# Patient Record
Sex: Female | Born: 1960 | Race: Black or African American | Hispanic: No | Marital: Married | Smoking: Never smoker
Health system: Southern US, Community
[De-identification: ages and names within clinical notes are randomized; demographics above are authoritative.]

## PROBLEM LIST (undated history)

## (undated) DIAGNOSIS — E785 Hyperlipidemia, unspecified: Secondary | ICD-10-CM

## (undated) DIAGNOSIS — J069 Acute upper respiratory infection, unspecified: Secondary | ICD-10-CM

## (undated) DIAGNOSIS — K219 Gastro-esophageal reflux disease without esophagitis: Secondary | ICD-10-CM

## (undated) DIAGNOSIS — I10 Essential (primary) hypertension: Secondary | ICD-10-CM

## (undated) DIAGNOSIS — I499 Cardiac arrhythmia, unspecified: Secondary | ICD-10-CM

## (undated) DIAGNOSIS — T7840XA Allergy, unspecified, initial encounter: Secondary | ICD-10-CM

## (undated) DIAGNOSIS — E0590 Thyrotoxicosis, unspecified without thyrotoxic crisis or storm: Secondary | ICD-10-CM

## (undated) DIAGNOSIS — M1990 Unspecified osteoarthritis, unspecified site: Secondary | ICD-10-CM

## (undated) HISTORY — DX: Acute upper respiratory infection, unspecified: J06.9

## (undated) HISTORY — PX: TUBAL LIGATION: SHX77

## (undated) HISTORY — DX: Gastro-esophageal reflux disease without esophagitis: K21.9

## (undated) HISTORY — DX: Hyperlipidemia, unspecified: E78.5

## (undated) HISTORY — DX: Allergy, unspecified, initial encounter: T78.40XA

---

## 1990-10-07 HISTORY — PX: THYROIDECTOMY, PARTIAL: SHX18

## 2001-12-25 ENCOUNTER — Emergency Department (HOSPITAL_COMMUNITY): Admission: EM | Admit: 2001-12-25 | Discharge: 2001-12-25 | Payer: Self-pay

## 2001-12-27 ENCOUNTER — Inpatient Hospital Stay (HOSPITAL_COMMUNITY): Admission: AD | Admit: 2001-12-27 | Discharge: 2001-12-27 | Payer: Self-pay | Admitting: *Deleted

## 2002-07-27 ENCOUNTER — Emergency Department (HOSPITAL_COMMUNITY): Admission: EM | Admit: 2002-07-27 | Discharge: 2002-07-27 | Payer: Self-pay | Admitting: Emergency Medicine

## 2002-10-30 ENCOUNTER — Emergency Department (HOSPITAL_COMMUNITY): Admission: EM | Admit: 2002-10-30 | Discharge: 2002-10-30 | Payer: Self-pay | Admitting: *Deleted

## 2003-06-21 ENCOUNTER — Encounter: Admission: RE | Admit: 2003-06-21 | Discharge: 2003-06-21 | Payer: Self-pay | Admitting: Internal Medicine

## 2003-06-21 ENCOUNTER — Encounter: Payer: Self-pay | Admitting: Internal Medicine

## 2004-01-31 ENCOUNTER — Encounter: Admission: RE | Admit: 2004-01-31 | Discharge: 2004-01-31 | Payer: Self-pay | Admitting: Internal Medicine

## 2004-05-09 ENCOUNTER — Encounter: Admission: RE | Admit: 2004-05-09 | Discharge: 2004-05-09 | Payer: Self-pay | Admitting: Internal Medicine

## 2004-05-16 ENCOUNTER — Encounter: Admission: RE | Admit: 2004-05-16 | Discharge: 2004-05-16 | Payer: Self-pay | Admitting: Internal Medicine

## 2004-06-18 ENCOUNTER — Ambulatory Visit: Payer: Self-pay | Admitting: Internal Medicine

## 2005-09-27 ENCOUNTER — Ambulatory Visit: Payer: Self-pay | Admitting: Family Medicine

## 2005-10-01 ENCOUNTER — Ambulatory Visit (HOSPITAL_COMMUNITY): Admission: RE | Admit: 2005-10-01 | Discharge: 2005-10-01 | Payer: Self-pay | Admitting: Internal Medicine

## 2005-10-10 ENCOUNTER — Ambulatory Visit: Payer: Self-pay | Admitting: Family Medicine

## 2005-10-10 LAB — CONVERTED CEMR LAB: Pap Smear: NORMAL

## 2005-10-15 ENCOUNTER — Ambulatory Visit: Payer: Self-pay | Admitting: *Deleted

## 2006-01-08 ENCOUNTER — Ambulatory Visit: Payer: Self-pay | Admitting: Family Medicine

## 2006-01-10 ENCOUNTER — Ambulatory Visit (HOSPITAL_COMMUNITY): Admission: RE | Admit: 2006-01-10 | Discharge: 2006-01-10 | Payer: Self-pay | Admitting: Internal Medicine

## 2006-01-13 ENCOUNTER — Ambulatory Visit: Payer: Self-pay | Admitting: Family Medicine

## 2006-02-06 ENCOUNTER — Ambulatory Visit: Payer: Self-pay | Admitting: Family Medicine

## 2006-03-19 ENCOUNTER — Emergency Department (HOSPITAL_COMMUNITY): Admission: EM | Admit: 2006-03-19 | Discharge: 2006-03-19 | Payer: Self-pay | Admitting: *Deleted

## 2006-10-01 ENCOUNTER — Encounter: Admission: RE | Admit: 2006-10-01 | Discharge: 2006-10-01 | Payer: Self-pay | Admitting: Internal Medicine

## 2007-06-19 ENCOUNTER — Inpatient Hospital Stay (HOSPITAL_COMMUNITY): Admission: AD | Admit: 2007-06-19 | Discharge: 2007-06-19 | Payer: Self-pay | Admitting: Obstetrics & Gynecology

## 2007-06-24 ENCOUNTER — Encounter (INDEPENDENT_AMBULATORY_CARE_PROVIDER_SITE_OTHER): Payer: Self-pay | Admitting: *Deleted

## 2007-07-07 ENCOUNTER — Encounter (INDEPENDENT_AMBULATORY_CARE_PROVIDER_SITE_OTHER): Payer: Self-pay | Admitting: Family Medicine

## 2007-07-07 DIAGNOSIS — I868 Varicose veins of other specified sites: Secondary | ICD-10-CM | POA: Insufficient documentation

## 2007-07-07 DIAGNOSIS — E079 Disorder of thyroid, unspecified: Secondary | ICD-10-CM | POA: Insufficient documentation

## 2007-07-07 DIAGNOSIS — K219 Gastro-esophageal reflux disease without esophagitis: Secondary | ICD-10-CM | POA: Insufficient documentation

## 2007-07-28 ENCOUNTER — Emergency Department (HOSPITAL_COMMUNITY): Admission: EM | Admit: 2007-07-28 | Discharge: 2007-07-28 | Payer: Self-pay | Admitting: Emergency Medicine

## 2007-09-09 ENCOUNTER — Emergency Department (HOSPITAL_COMMUNITY): Admission: EM | Admit: 2007-09-09 | Discharge: 2007-09-09 | Payer: Self-pay | Admitting: Emergency Medicine

## 2007-09-25 ENCOUNTER — Ambulatory Visit: Payer: Self-pay | Admitting: Internal Medicine

## 2007-09-25 LAB — CONVERTED CEMR LAB
ALT: 13 units/L (ref 0–35)
AST: 15 units/L (ref 0–37)
Albumin: 3.9 g/dL (ref 3.5–5.2)
Alkaline Phosphatase: 68 units/L (ref 39–117)
BUN: 12 mg/dL (ref 6–23)
Basophils Absolute: 0 10*3/uL (ref 0.0–0.1)
Basophils Relative: 0 % (ref 0–1)
CO2: 25 meq/L (ref 19–32)
Calcium: 9.1 mg/dL (ref 8.4–10.5)
Chloride: 103 meq/L (ref 96–112)
Cholesterol: 209 mg/dL — ABNORMAL HIGH (ref 0–200)
Creatinine, Ser: 0.38 mg/dL — ABNORMAL LOW (ref 0.40–1.20)
Eosinophils Absolute: 0.6 10*3/uL (ref 0.2–0.7)
Eosinophils Relative: 7 % — ABNORMAL HIGH (ref 0–5)
Glucose, Bld: 115 mg/dL — ABNORMAL HIGH (ref 70–99)
HCT: 40.2 % (ref 36.0–46.0)
HDL: 44 mg/dL (ref >39)
Helicobacter Pylori Antibody-IgG: 3.8 — ABNORMAL HIGH
Hemoglobin: 12.7 g/dL (ref 12.0–15.0)
LDL Cholesterol: 134 mg/dL — ABNORMAL HIGH (ref 0–99)
Lymphocytes Relative: 30 % (ref 12–46)
Lymphs Abs: 2.6 10*3/uL (ref 0.7–4.0)
MCHC: 31.6 g/dL (ref 30.0–36.0)
MCV: 81.7 fL (ref 78.0–100.0)
Monocytes Absolute: 0.6 10*3/uL (ref 0.1–1.0)
Monocytes Relative: 7 % (ref 3–12)
Neutro Abs: 4.9 10*3/uL (ref 1.7–7.7)
Neutrophils Relative %: 56 % (ref 43–77)
Platelets: 355 10*3/uL (ref 150–400)
Potassium: 4.2 meq/L (ref 3.5–5.3)
RBC: 4.92 M/uL (ref 3.87–5.11)
RDW: 14.2 % (ref 11.5–15.5)
Sodium: 141 meq/L (ref 135–145)
TSH: 0.657 microintl units/mL (ref 0.350–5.50)
Total Bilirubin: 0.5 mg/dL (ref 0.3–1.2)
Total CHOL/HDL Ratio: 4.8
Total Protein: 7 g/dL (ref 6.0–8.3)
Triglycerides: 155 mg/dL — ABNORMAL HIGH (ref <150)
VLDL: 31 mg/dL (ref 0–40)
WBC: 8.9 10*3/uL (ref 4.0–10.5)

## 2007-11-18 ENCOUNTER — Encounter: Admission: RE | Admit: 2007-11-18 | Discharge: 2007-11-18 | Payer: Self-pay | Admitting: General Practice

## 2008-01-26 ENCOUNTER — Encounter: Admission: RE | Admit: 2008-01-26 | Discharge: 2008-01-26 | Payer: Self-pay | Admitting: General Practice

## 2009-05-12 ENCOUNTER — Encounter (INDEPENDENT_AMBULATORY_CARE_PROVIDER_SITE_OTHER): Payer: Self-pay | Admitting: Adult Health

## 2009-05-12 ENCOUNTER — Ambulatory Visit: Payer: Self-pay | Admitting: Internal Medicine

## 2009-05-12 LAB — CONVERTED CEMR LAB
ALT: 17 units/L (ref 0–35)
AST: 14 units/L (ref 0–37)
Albumin: 3.9 g/dL (ref 3.5–5.2)
Alkaline Phosphatase: 66 units/L (ref 39–117)
BUN: 14 mg/dL (ref 6–23)
Basophils Absolute: 0 10*3/uL (ref 0.0–0.1)
Basophils Relative: 0 % (ref 0–1)
CO2: 23 meq/L (ref 19–32)
Calcium: 8.9 mg/dL (ref 8.4–10.5)
Chloride: 103 meq/L (ref 96–112)
Creatinine, Ser: 0.41 mg/dL (ref 0.40–1.20)
Eosinophils Absolute: 0.3 10*3/uL (ref 0.0–0.7)
Eosinophils Relative: 4 % (ref 0–5)
Glucose, Bld: 102 mg/dL — ABNORMAL HIGH (ref 70–99)
HCT: 39 % (ref 36.0–46.0)
Helicobacter Pylori Antibody-IgG: 4.4 — ABNORMAL HIGH
Hemoglobin: 12.2 g/dL (ref 12.0–15.0)
Lymphocytes Relative: 32 % (ref 12–46)
Lymphs Abs: 2.4 10*3/uL (ref 0.7–4.0)
MCHC: 31.3 g/dL (ref 30.0–36.0)
MCV: 79.8 fL (ref 78.0–100.0)
Monocytes Absolute: 0.4 10*3/uL (ref 0.1–1.0)
Monocytes Relative: 6 % (ref 3–12)
Neutro Abs: 4.3 10*3/uL (ref 1.7–7.7)
Neutrophils Relative %: 58 % (ref 43–77)
Platelets: 344 10*3/uL (ref 150–400)
Potassium: 4 meq/L (ref 3.5–5.3)
RBC: 4.89 M/uL (ref 3.87–5.11)
RDW: 14.9 % (ref 11.5–15.5)
Sodium: 139 meq/L (ref 135–145)
T3 Uptake Ratio: 40 % — ABNORMAL HIGH (ref 22.5–37.0)
TSH: 0.649 microintl units/mL (ref 0.350–4.500)
Total Bilirubin: 0.4 mg/dL (ref 0.3–1.2)
Total Protein: 6.9 g/dL (ref 6.0–8.3)
WBC: 7.4 10*3/uL (ref 4.0–10.5)

## 2009-06-19 ENCOUNTER — Ambulatory Visit: Payer: Self-pay | Admitting: Internal Medicine

## 2009-06-19 ENCOUNTER — Encounter (INDEPENDENT_AMBULATORY_CARE_PROVIDER_SITE_OTHER): Payer: Self-pay | Admitting: Adult Health

## 2009-06-19 LAB — CONVERTED CEMR LAB: Microalb, Ur: 1.4 mg/dL (ref 0.00–1.89)

## 2009-07-20 ENCOUNTER — Ambulatory Visit: Payer: Self-pay | Admitting: Internal Medicine

## 2009-10-06 ENCOUNTER — Inpatient Hospital Stay (HOSPITAL_COMMUNITY): Admission: EM | Admit: 2009-10-06 | Discharge: 2009-10-10 | Payer: Self-pay | Admitting: Emergency Medicine

## 2009-11-03 ENCOUNTER — Encounter: Admission: RE | Admit: 2009-11-03 | Discharge: 2009-11-03 | Payer: Self-pay | Admitting: General Surgery

## 2010-02-16 ENCOUNTER — Emergency Department (HOSPITAL_COMMUNITY): Admission: EM | Admit: 2010-02-16 | Discharge: 2010-02-16 | Payer: Self-pay | Admitting: Emergency Medicine

## 2010-03-14 ENCOUNTER — Encounter: Admission: RE | Admit: 2010-03-14 | Discharge: 2010-03-14 | Payer: Self-pay | Admitting: Internal Medicine

## 2010-08-03 ENCOUNTER — Emergency Department (HOSPITAL_COMMUNITY): Admission: EM | Admit: 2010-08-03 | Discharge: 2010-08-03 | Payer: Self-pay | Admitting: Emergency Medicine

## 2010-10-28 ENCOUNTER — Encounter: Payer: Self-pay | Admitting: Internal Medicine

## 2010-10-28 ENCOUNTER — Encounter: Payer: Self-pay | Admitting: General Surgery

## 2010-10-29 ENCOUNTER — Encounter: Payer: Self-pay | Admitting: Internal Medicine

## 2010-11-06 NOTE — Miscellaneous (Signed)
Summary: VIP  Patient: Crystal [name] Note: All result statuses are Final unless otherwise noted.  Tests: (1) VIP (Medications)   LLIMPORTMEDS              "Result Below..."       RESULT: RHINOCORT AQUA SUSP 32 MCG/ACT*USE ONE TO TWO SPRAYS IN EACH NOSTRIL DAILY FOR ALLERGIES.  GENE HAS ICP RHINOCORT 04/07*08/04/2007*Last Refill: [id number]***   LLIMPORTALLS              "Result Below..."       RESULT: ASPIRIN UXLK*[id number]**  Note: An exclamation mark (!) indicates a result that was not dispersed into the flowsheet. Document Creation Date: 08/06/2007 3:05 PM _______________________________________________________________________  (1) Order result status: Final Collection or observation date-time: 06/24/2007 Requested date-time: 06/24/2007 Receipt date-time:  Reported date-time: 06/24/2007 Referring Physician:   Ordering Physician:   Specimen Source:  Source: [facility] [place] Order Number:  Lab site:

## 2010-12-19 LAB — CBC
HCT: 40.4 % (ref 36.0–46.0)
Hemoglobin: 13 g/dL (ref 12.0–15.0)
MCH: 25.3 pg — ABNORMAL LOW (ref 26.0–34.0)
MCHC: 32.2 g/dL (ref 30.0–36.0)
MCV: 78.8 fL (ref 78.0–100.0)
Platelets: 301 10*3/uL (ref 150–400)
RBC: 5.13 MIL/uL — ABNORMAL HIGH (ref 3.87–5.11)
RDW: 14 % (ref 11.5–15.5)
WBC: 9 10*3/uL (ref 4.0–10.5)

## 2010-12-19 LAB — COMPREHENSIVE METABOLIC PANEL
ALT: 32 U/L (ref 0–35)
AST: 25 U/L (ref 0–37)
Albumin: 3.4 g/dL — ABNORMAL LOW (ref 3.5–5.2)
Alkaline Phosphatase: 79 U/L (ref 39–117)
BUN: 9 mg/dL (ref 6–23)
CO2: 24 mEq/L (ref 19–32)
Calcium: 9.1 mg/dL (ref 8.4–10.5)
Chloride: 103 mEq/L (ref 96–112)
Creatinine, Ser: 0.41 mg/dL (ref 0.4–1.2)
GFR calc Af Amer: >60 mL/min (ref >60)
GFR calc non Af Amer: >60 mL/min (ref >60)
Glucose, Bld: 271 mg/dL — ABNORMAL HIGH (ref 70–99)
Potassium: 3.6 mEq/L (ref 3.5–5.1)
Sodium: 134 mEq/L — ABNORMAL LOW (ref 135–145)
Total Bilirubin: 0.4 mg/dL (ref 0.3–1.2)
Total Protein: 7.4 g/dL (ref 6.0–8.3)

## 2010-12-19 LAB — URINE MICROSCOPIC-ADD ON

## 2010-12-19 LAB — POCT I-STAT, CHEM 8
BUN: 9 mg/dL (ref 6–23)
Calcium, Ion: 1.14 mmol/L (ref 1.12–1.32)
Chloride: 103 mEq/L (ref 96–112)
Creatinine, Ser: 0.3 mg/dL — ABNORMAL LOW (ref 0.4–1.2)
Glucose, Bld: 284 mg/dL — ABNORMAL HIGH (ref 70–99)
HCT: 43 % (ref 36.0–46.0)
Hemoglobin: 14.6 g/dL (ref 12.0–15.0)
Potassium: 3.7 mEq/L (ref 3.5–5.1)
Sodium: 137 mEq/L (ref 135–145)
TCO2: 25 mmol/L (ref 0–100)

## 2010-12-19 LAB — URINALYSIS, ROUTINE W REFLEX MICROSCOPIC
Bilirubin Urine: NEGATIVE
Glucose, UA: >1000 mg/dL — AB
Ketones, ur: NEGATIVE mg/dL
Leukocytes, UA: NEGATIVE
Nitrite: NEGATIVE
Protein, ur: NEGATIVE mg/dL
Specific Gravity, Urine: 1.04 — ABNORMAL HIGH (ref 1.005–1.030)
Urobilinogen, UA: 0.2 mg/dL (ref 0.0–1.0)
pH: 5 (ref 5.0–8.0)

## 2010-12-19 LAB — DIFFERENTIAL
Basophils Absolute: 0 10*3/uL (ref 0.0–0.1)
Basophils Relative: 0 % (ref 0–1)
Eosinophils Absolute: 0.1 10*3/uL (ref 0.0–0.7)
Eosinophils Relative: 2 % (ref 0–5)
Lymphocytes Relative: 29 % (ref 12–46)
Lymphs Abs: 2.6 10*3/uL (ref 0.7–4.0)
Monocytes Absolute: 0.5 10*3/uL (ref 0.1–1.0)
Monocytes Relative: 6 % (ref 3–12)
Neutro Abs: 5.7 10*3/uL (ref 1.7–7.7)
Neutrophils Relative %: 64 % (ref 43–77)

## 2010-12-19 LAB — POCT PREGNANCY, URINE: Preg Test, Ur: NEGATIVE

## 2010-12-19 LAB — LIPASE, BLOOD: Lipase: 27 U/L (ref 11–59)

## 2010-12-23 LAB — CBC
HCT: 34.7 % — ABNORMAL LOW (ref 36.0–46.0)
Hemoglobin: 11.6 g/dL — ABNORMAL LOW (ref 12.0–15.0)
MCHC: 33.6 g/dL (ref 30.0–36.0)
MCV: 79 fL (ref 78.0–100.0)
Platelets: 371 10*3/uL (ref 150–400)
RBC: 4.39 MIL/uL (ref 3.87–5.11)
RDW: 14 % (ref 11.5–15.5)
WBC: 9.1 10*3/uL (ref 4.0–10.5)

## 2011-01-07 LAB — WET PREP, GENITAL
Clue Cells Wet Prep HPF POC: NONE SEEN
Trich, Wet Prep: NONE SEEN
WBC, Wet Prep HPF POC: NONE SEEN

## 2011-01-07 LAB — BASIC METABOLIC PANEL
BUN: 7 mg/dL (ref 6–23)
CO2: 28 mEq/L (ref 19–32)
Calcium: 8.8 mg/dL (ref 8.4–10.5)
Chloride: 104 mEq/L (ref 96–112)
Creatinine, Ser: 0.36 mg/dL — ABNORMAL LOW (ref 0.4–1.2)
GFR calc Af Amer: >60 mL/min (ref >60)
GFR calc non Af Amer: >60 mL/min (ref >60)
Glucose, Bld: 157 mg/dL — ABNORMAL HIGH (ref 70–99)
Potassium: 4.2 mEq/L (ref 3.5–5.1)
Sodium: 139 mEq/L (ref 135–145)

## 2011-01-07 LAB — URINALYSIS, ROUTINE W REFLEX MICROSCOPIC
Bilirubin Urine: NEGATIVE
Glucose, UA: NEGATIVE mg/dL
Ketones, ur: NEGATIVE mg/dL
Leukocytes, UA: NEGATIVE
Nitrite: NEGATIVE
Protein, ur: NEGATIVE mg/dL
Specific Gravity, Urine: 1.026 (ref 1.005–1.030)
Urobilinogen, UA: 1 mg/dL (ref 0.0–1.0)
pH: 5.5 (ref 5.0–8.0)

## 2011-01-07 LAB — DIFFERENTIAL
Basophils Absolute: 0 10*3/uL (ref 0.0–0.1)
Basophils Relative: 0 % (ref 0–1)
Eosinophils Absolute: 0.1 10*3/uL (ref 0.0–0.7)
Eosinophils Relative: 1 % (ref 0–5)
Lymphocytes Relative: 12 % (ref 12–46)
Lymphs Abs: 1.6 10*3/uL (ref 0.7–4.0)
Monocytes Absolute: 0.9 10*3/uL (ref 0.1–1.0)
Monocytes Relative: 7 % (ref 3–12)
Neutro Abs: 10.3 10*3/uL — ABNORMAL HIGH (ref 1.7–7.7)
Neutrophils Relative %: 80 % — ABNORMAL HIGH (ref 43–77)

## 2011-01-07 LAB — URINE MICROSCOPIC-ADD ON

## 2011-01-07 LAB — PREGNANCY, URINE: Preg Test, Ur: NEGATIVE

## 2011-01-07 LAB — CBC
HCT: 37.5 % (ref 36.0–46.0)
Hemoglobin: 12.6 g/dL (ref 12.0–15.0)
MCHC: 33.6 g/dL (ref 30.0–36.0)
MCV: 79.1 fL (ref 78.0–100.0)
Platelets: 377 10*3/uL (ref 150–400)
RBC: 4.74 MIL/uL (ref 3.87–5.11)
RDW: 14.4 % (ref 11.5–15.5)
WBC: 12.9 10*3/uL — ABNORMAL HIGH (ref 4.0–10.5)

## 2011-01-07 LAB — GC/CHLAMYDIA PROBE AMP, GENITAL
Chlamydia, DNA Probe: NEGATIVE
GC Probe Amp, Genital: NEGATIVE

## 2011-01-07 LAB — URINE CULTURE
Colony Count: NO GROWTH
Culture: NO GROWTH

## 2011-02-15 ENCOUNTER — Emergency Department (HOSPITAL_COMMUNITY)
Admission: EM | Admit: 2011-02-15 | Discharge: 2011-02-15 | Disposition: A | Discharge status: Home / Self Care | Payer: Medicaid Other | Attending: Emergency Medicine | Admitting: Emergency Medicine

## 2011-02-15 ENCOUNTER — Emergency Department (HOSPITAL_COMMUNITY): Payer: Medicaid Other

## 2011-02-15 DIAGNOSIS — R110 Nausea: Secondary | ICD-10-CM | POA: Insufficient documentation

## 2011-02-15 DIAGNOSIS — E119 Type 2 diabetes mellitus without complications: Secondary | ICD-10-CM | POA: Insufficient documentation

## 2011-02-15 DIAGNOSIS — Z79899 Other long term (current) drug therapy: Secondary | ICD-10-CM | POA: Insufficient documentation

## 2011-02-15 DIAGNOSIS — I10 Essential (primary) hypertension: Secondary | ICD-10-CM | POA: Insufficient documentation

## 2011-02-15 DIAGNOSIS — M542 Cervicalgia: Secondary | ICD-10-CM | POA: Insufficient documentation

## 2011-02-15 DIAGNOSIS — R51 Headache: Secondary | ICD-10-CM | POA: Insufficient documentation

## 2011-02-15 DIAGNOSIS — J329 Chronic sinusitis, unspecified: Secondary | ICD-10-CM | POA: Insufficient documentation

## 2011-02-15 LAB — URINALYSIS, ROUTINE W REFLEX MICROSCOPIC
Bilirubin Urine: NEGATIVE
Glucose, UA: 100 mg/dL — AB
Ketones, ur: NEGATIVE mg/dL
Leukocytes, UA: NEGATIVE
Nitrite: NEGATIVE
Protein, ur: NEGATIVE mg/dL
Specific Gravity, Urine: 1.02 (ref 1.005–1.030)
Urobilinogen, UA: 0.2 mg/dL (ref 0.0–1.0)
pH: 5.5 (ref 5.0–8.0)

## 2011-02-15 LAB — POCT I-STAT, CHEM 8
BUN: 9 mg/dL (ref 6–23)
Calcium, Ion: 1.11 mmol/L — ABNORMAL LOW (ref 1.12–1.32)
Chloride: 102 mEq/L (ref 96–112)
Creatinine, Ser: 0.4 mg/dL (ref 0.4–1.2)
Glucose, Bld: 211 mg/dL — ABNORMAL HIGH (ref 70–99)
HCT: 46 % (ref 36.0–46.0)
Hemoglobin: 15.6 g/dL — ABNORMAL HIGH (ref 12.0–15.0)
Potassium: 4.1 mEq/L (ref 3.5–5.1)
Sodium: 139 mEq/L (ref 135–145)
TCO2: 29 mmol/L (ref 0–100)

## 2011-02-15 LAB — URINE MICROSCOPIC-ADD ON

## 2011-07-15 LAB — URINALYSIS, ROUTINE W REFLEX MICROSCOPIC
Bilirubin Urine: NEGATIVE
Glucose, UA: NEGATIVE
Ketones, ur: NEGATIVE
Leukocytes, UA: NEGATIVE
Nitrite: NEGATIVE
Protein, ur: NEGATIVE
Specific Gravity, Urine: 1.014
Urobilinogen, UA: 0.2
pH: 5.5

## 2011-07-15 LAB — CBC
HCT: 38.8
Hemoglobin: 12.7
MCHC: 32.8
MCV: 79.9
Platelets: 392
RBC: 4.86
RDW: 14.2
WBC: 9.3

## 2011-07-15 LAB — I-STAT 8, (EC8 V) (CONVERTED LAB)
Acid-Base Excess: 1
BUN: 11
Bicarbonate: 25.1 — ABNORMAL HIGH
Chloride: 105
Glucose, Bld: 108 — ABNORMAL HIGH
HCT: 42
Hemoglobin: 14.3
Operator id: 196461
Potassium: 3.9
Sodium: 138
TCO2: 26
pCO2, Ven: 38.2 — ABNORMAL LOW
pH, Ven: 7.426 — ABNORMAL HIGH

## 2011-07-15 LAB — POCT I-STAT CREATININE
Creatinine, Ser: 0.4
Operator id: 196461

## 2011-07-15 LAB — PREGNANCY, URINE: Preg Test, Ur: NEGATIVE

## 2011-07-15 LAB — DIFFERENTIAL
Basophils Absolute: 0
Basophils Relative: 0
Eosinophils Absolute: 0.8 — ABNORMAL HIGH
Eosinophils Relative: 8 — ABNORMAL HIGH
Lymphocytes Relative: 32
Lymphs Abs: 2.9
Monocytes Absolute: 0.6
Monocytes Relative: 6
Neutro Abs: 5
Neutrophils Relative %: 54

## 2011-07-15 LAB — URINE MICROSCOPIC-ADD ON

## 2011-07-19 LAB — URINALYSIS, ROUTINE W REFLEX MICROSCOPIC
Bilirubin Urine: NEGATIVE
Glucose, UA: NEGATIVE
Ketones, ur: NEGATIVE
Nitrite: NEGATIVE
Protein, ur: NEGATIVE
Specific Gravity, Urine: 1.02
Urobilinogen, UA: 0.2
pH: 5.5

## 2011-07-19 LAB — URINE MICROSCOPIC-ADD ON

## 2011-07-19 LAB — POCT PREGNANCY, URINE
Operator id: 120561
Preg Test, Ur: NEGATIVE

## 2011-07-19 LAB — WET PREP, GENITAL
Clue Cells Wet Prep HPF POC: NONE SEEN
Trich, Wet Prep: NONE SEEN
Yeast Wet Prep HPF POC: NONE SEEN

## 2011-07-19 LAB — GC/CHLAMYDIA PROBE AMP, GENITAL
Chlamydia, DNA Probe: NEGATIVE
GC Probe Amp, Genital: NEGATIVE

## 2011-09-17 ENCOUNTER — Other Ambulatory Visit: Payer: Self-pay | Admitting: Obstetrics and Gynecology

## 2011-09-17 DIAGNOSIS — Z1231 Encounter for screening mammogram for malignant neoplasm of breast: Secondary | ICD-10-CM

## 2011-10-10 ENCOUNTER — Ambulatory Visit: Payer: Medicaid Other

## 2011-12-06 ENCOUNTER — Emergency Department (HOSPITAL_COMMUNITY)
Admission: EM | Admit: 2011-12-06 | Discharge: 2011-12-07 | Disposition: A | Discharge status: Home / Self Care | Payer: Medicaid Other | Attending: Emergency Medicine | Admitting: Emergency Medicine

## 2011-12-06 ENCOUNTER — Encounter (HOSPITAL_COMMUNITY): Payer: Self-pay | Admitting: *Deleted

## 2011-12-06 DIAGNOSIS — I10 Essential (primary) hypertension: Secondary | ICD-10-CM | POA: Insufficient documentation

## 2011-12-06 DIAGNOSIS — R110 Nausea: Secondary | ICD-10-CM | POA: Insufficient documentation

## 2011-12-06 DIAGNOSIS — R29818 Other symptoms and signs involving the nervous system: Secondary | ICD-10-CM | POA: Insufficient documentation

## 2011-12-06 DIAGNOSIS — E119 Type 2 diabetes mellitus without complications: Secondary | ICD-10-CM | POA: Insufficient documentation

## 2011-12-06 DIAGNOSIS — H5789 Other specified disorders of eye and adnexa: Secondary | ICD-10-CM | POA: Insufficient documentation

## 2011-12-06 DIAGNOSIS — Z79899 Other long term (current) drug therapy: Secondary | ICD-10-CM | POA: Insufficient documentation

## 2011-12-06 DIAGNOSIS — R51 Headache: Secondary | ICD-10-CM | POA: Insufficient documentation

## 2011-12-06 HISTORY — DX: Essential (primary) hypertension: I10

## 2011-12-06 NOTE — ED Notes (Signed)
Pt c/o right sided headache, st's onset last pm.  St's headache has continued all day.  Pt alert adnd

## 2011-12-06 NOTE — ED Notes (Signed)
Pt reports around midnight she woke up with right sided headache associated with nausea. States she feels like she wants to cry her head hurts so much. States she took her meds as needed for blood pressure. Stroke scale negative.

## 2011-12-07 ENCOUNTER — Encounter (HOSPITAL_COMMUNITY): Payer: Self-pay | Admitting: *Deleted

## 2011-12-07 ENCOUNTER — Emergency Department (HOSPITAL_COMMUNITY): Payer: Medicaid Other

## 2011-12-07 LAB — POCT I-STAT, CHEM 8
BUN: 8 mg/dL (ref 6–23)
Calcium, Ion: 1.2 mmol/L (ref 1.12–1.32)
Chloride: 104 mEq/L (ref 96–112)
Creatinine, Ser: 0.6 mg/dL (ref 0.50–1.10)
Glucose, Bld: 132 mg/dL — ABNORMAL HIGH (ref 70–99)
HCT: 41 % (ref 36.0–46.0)
Hemoglobin: 13.9 g/dL (ref 12.0–15.0)
Potassium: 3.7 mEq/L (ref 3.5–5.1)
Sodium: 142 mEq/L (ref 135–145)
TCO2: 27 mmol/L (ref 0–100)

## 2011-12-07 MED ORDER — DEXAMETHASONE SODIUM PHOSPHATE 10 MG/ML IJ SOLN
10.0000 mg | Freq: Once | INTRAMUSCULAR | Status: AC
  Administered 2011-12-07: 10 mg via INTRAVENOUS
  Filled 2011-12-07: qty 1

## 2011-12-07 MED ORDER — DIPHENHYDRAMINE HCL 50 MG/ML IJ SOLN
25.0000 mg | Freq: Once | INTRAMUSCULAR | Status: AC
  Administered 2011-12-07: 50 mg via INTRAVENOUS
  Filled 2011-12-07: qty 1

## 2011-12-07 MED ORDER — SODIUM CHLORIDE 0.9 % IV BOLUS (SEPSIS)
1000.0000 mL | Freq: Once | INTRAVENOUS | Status: AC
  Administered 2011-12-07: 1000 mL via INTRAVENOUS

## 2011-12-07 MED ORDER — FLUTICASONE PROPIONATE 50 MCG/ACT NA SUSP: 2.0000 | Freq: Every day | NASAL | Status: DC

## 2011-12-07 MED ORDER — METOCLOPRAMIDE HCL 5 MG/ML IJ SOLN
10.0000 mg | Freq: Once | INTRAMUSCULAR | Status: AC
  Administered 2011-12-07: 10 mg via INTRAVENOUS
  Filled 2011-12-07: qty 2

## 2011-12-07 NOTE — Discharge Instructions (Signed)
Please read the instructions below. CT of the brain tonight was completely normal. There were some changes noted suggesting chronic sinus problems. These sinus problems may be the source of your headache. We are prescribing Flonase for you to use daily to see if this helps. Your chemistry panel was completely normal. You'll need to arrange follow up with Dr. Mikeal Hawthorne for further evaluation of these headaches persist.   General Headache, Without Cause A general headache has no specific cause. These headaches are not life-threatening. They will not lead to other types of headaches. HOME CARE   Make and keep follow-up visits with your doctor.   Only take medicine as told by your doctor.   Try to relax, get a massage, or use your thoughts to control your body (biofeedback).   Apply cold or heat to the head and neck. Apply 3 or 4 times a day or as needed.  Finding out the results of your test Ask when your test results will be ready. Make sure you get your test results. GET HELP RIGHT AWAY IF:   You have problems with medicine.   Your medicine does not help relieve pain.   Your headache changes or becomes worse.   You feel sick to your stomach (nauseous) or throw up (vomit).   You have a temperature by mouth above 102 F (38.9 C), not controlled by medicine.   Your have a stiff neck.   You have vision loss.   You have muscle weakness.   You lose control of your muscles.   You lose balance or have trouble walking.   You feel like you are going to pass out (faint).  MAKE SURE YOU:   Understand these instructions.   Will watch this condition.   Will get help right away if you are not doing well or get worse.  Document Released: 07/02/2008 Document Revised: 06/05/2011 Document Reviewed: 07/02/2008 [facility] Patient Information 2012 [place], [place].Sinus Headache A sinus headache is when your sinuses become clogged or swollen. Sinus headaches can range from mild to severe.   CAUSES A sinus headache can have different causes, such as:  Colds.   Sinus infections.   Allergies.  SYMPTOMS  Symptoms of a sinus headache may vary and can include:  Headache.   Pain or pressure in the face.   Congested or runny nose.   Fever.   Inability to smell.   Pain in upper teeth.  Weather changes can make symptoms worse. TREATMENT  The treatment of a sinus headache depends on the cause.  Sinus pain caused by a sinus infection may be treated with antibiotic medicine.   Sinus pain caused by allergies may be helped by allergy medicines (antihistamines) and medicated nasal sprays.   Sinus pain caused by congestion may be helped by flushing the nose and sinuses with saline solution.  HOME CARE INSTRUCTIONS   If antibiotics are prescribed, take them as directed. Finish them even if you start to feel better.   Only take over-the-counter or prescription medicines for pain, discomfort, or fever as directed by your caregiver.   If you have congestion, use a nasal spray to help reduce pressure.  SEEK IMMEDIATE MEDICAL CARE IF:  You have a fever.   You have headaches more than once a week.   You have sensitivity to light or sound.   You have repeated nausea and vomiting.   You have vision problems.   You have sudden, severe pain in your face or head.   You  have a seizure.   You are confused.   Your sinus headaches do not get better after treatment. Many people think they have a sinus headache when they actually have migraines or tension headaches.  MAKE SURE YOU:   Understand these instructions.   Will watch your condition.   Will get help right away if you are not doing well or get worse.  Document Released: 10/31/2004 Document Revised: 06/05/2011 Document Reviewed: 12/22/2010 [facility] Patient Information 2012 [place], [place].Sinus Headache A sinus headache is when your sinuses become clogged or swollen. Sinus headaches can range from mild to severe.   CAUSES A sinus headache can have different causes, such as:  Colds.   Sinus infections.   Allergies.  SYMPTOMS  Symptoms of a sinus headache may vary and can include:  Headache.   Pain or pressure in the face.   Congested or runny nose.   Fever.   Inability to smell.   Pain in upper teeth.  Weather changes can make symptoms worse. TREATMENT  The treatment of a sinus headache depends on the cause.  Sinus pain caused by a sinus infection may be treated with antibiotic medicine.   Sinus pain caused by allergies may be helped by allergy medicines (antihistamines) and medicated nasal sprays.   Sinus pain caused by congestion may be helped by flushing the nose and sinuses with saline solution.  HOME CARE INSTRUCTIONS   If antibiotics are prescribed, take them as directed. Finish them even if you start to feel better.   Only take over-the-counter or prescription medicines for pain, discomfort, or fever as directed by your caregiver.   If you have congestion, use a nasal spray to help reduce pressure.  SEEK IMMEDIATE MEDICAL CARE IF:  You have a fever.   You have headaches more than once a week.   You have sensitivity to light or sound.   You have repeated nausea and vomiting.   You have vision problems.   You have sudden, severe pain in your face or head.   You have a seizure.   You are confused.   Your sinus headaches do not get better after treatment. Many people think they have a sinus headache when they actually have migraines or tension headaches.  MAKE SURE YOU:   Understand these instructions.   Will watch your condition.   Will get help right away if you are not doing well or get worse.  Document Released: 10/31/2004 Document Revised: 06/05/2011 Document Reviewed: 12/22/2010 [facility] Patient Information 2012 [place], [place].

## 2011-12-07 NOTE — ED Notes (Signed)
Pt st's she feels better and headache is gone.  Husband at bedside.

## 2011-12-09 NOTE — ED Provider Notes (Signed)
History     CSN: [id number]  Arrival date & time 12/06/11  1918   First MD Initiated Contact with Patient 12/07/11 0004      Chief Complaint  Patient presents with  . Headache  . Nausea     Patient is a 51 y.o. female presenting with headaches.  Headache  This is a new problem. The current episode started 12 to 24 hours ago. The problem occurs constantly. The problem has been gradually worsening. The headache is associated with nothing. The pain is located in the right unilateral region. The quality of the pain is described as throbbing. The pain is at a severity of 10/10. The pain is severe. Pertinent negatives include no fever, no near-syncope, no nausea and no vomiting. She has tried acetaminophen for the symptoms. The treatment provided no relief.  Pt reports onset of severe (R) sided h/a yesterday morning at approx 0400 that has persisted and worsened. H/A has been associated w/ Increased tearing of the (R) eye but no visual disturbances or other associated sx's. Admits she has had h/a's before associated w/ chronic sinus "problems" but this h/a is worse and much more persistent. Denies fever, n/a or any focal neurological symptoms.  Past Medical History  Diagnosis Date  . Hypertension   . Diabetes mellitus     History reviewed. No pertinent past surgical history.  History reviewed. No pertinent family history.  History  Substance Use Topics  . Smoking status: Never Smoker   . Smokeless tobacco: Not on file  . Alcohol Use: No    OB History    Grav Para Term Preterm Abortions TAB SAB Ect Mult Living                  Review of Systems  Constitutional: Negative.  Negative for fever.  Eyes: Negative.   Respiratory: Negative.   Cardiovascular: Negative.  Negative for near-syncope.  Gastrointestinal: Negative.  Negative for nausea and vomiting.  Genitourinary: Negative.   Musculoskeletal: Negative.   Skin: Negative.   Neurological: Positive for headaches.    Hematological: Negative.   Psychiatric/Behavioral: Negative.     Allergies  Aspirin  Home Medications   Current Outpatient Rx  Name Route Sig Dispense Refill  . AMLODIPINE BESYLATE 10 MG PO TABS Oral Take 10 mg by mouth daily.    [name] GLIPIZIDE 10 MG PO TABS Oral Take 10 mg by mouth 2 (two) times daily before a meal.    . METFORMIN HCL 500 MG PO TABS Oral Take 500 mg by mouth 2 (two) times daily with a meal.    . FLUTICASONE PROPIONATE 50 MCG/ACT NA SUSP Nasal Place 2 sprays into the nose daily. X 1 week then 1 spray daily in each nostril 16 g 2    BP 136/77  Pulse 87  Temp(Src) 97.9 F (36.6 C) (Oral)  Resp 19  SpO2 99%  Physical Exam  Constitutional: She is oriented to person, place, and time. She appears well-developed and well-nourished.  HENT:  Head: Normocephalic and atraumatic.  Right Ear: Tympanic membrane, external ear and ear canal normal.  Left Ear: Tympanic membrane, external ear and ear canal normal.  Nose: Nose normal.  Mouth/Throat: Uvula is midline, oropharynx is clear and moist and mucous membranes are normal.  Eyes: Conjunctivae, EOM and lids are normal. Pupils are equal, round, and reactive to light.  Neck: Neck supple.  Cardiovascular: Normal rate and regular rhythm.   Pulmonary/Chest: Effort normal and breath sounds normal.  Abdominal: Soft. Bowel  sounds are normal.  Musculoskeletal: Normal range of motion.  Neurological: She is alert and oriented to person, place, and time. She has normal strength and normal reflexes. A cranial nerve deficit is present. She displays a negative Romberg sign. Coordination normal. GCS eye subscore is 4. GCS verbal subscore is 5. GCS motor subscore is 6.  Skin: Skin is warm and dry. No erythema.  Psychiatric: She has a normal mood and affect.    ED Course  Procedures   Findings and clinical impression discussed w/ pt. Pt reports complete relief of h/a after IVF's and medications. Ct findings c/w chronic sinus disease.  Will plan for d/c home w/ Rx for Flonase and encouarage f/u w/ her PCP. Pt agreeable w/ plan.  Labs Reviewed  POCT I-STAT, CHEM 8 - Abnormal; Notable for the following:    Glucose, Bld 132 (*)    All other components within normal limits  LAB REPORT - SCANNED   No results found.   1. Headache       MDM  HPI/PE and clinical findings c/w 1. Headache (Nonspecific, no focal neurological findings, Ct findings c/w chronic sinus disease, h/a resolved while in dept, will tx w/ Flonase).         Leanne Chang, NP 12/09/11 [phone]

## 2011-12-12 NOTE — ED Provider Notes (Signed)
Medical screening examination/treatment/procedure(s) were performed by non-physician practitioner and as supervising physician I was immediately available for consultation/collaboration.  Ethelda Chick, MD 12/12/11 [phone]

## 2012-02-23 ENCOUNTER — Encounter (HOSPITAL_COMMUNITY): Payer: Self-pay | Admitting: *Deleted

## 2012-02-23 ENCOUNTER — Emergency Department (HOSPITAL_COMMUNITY)
Admission: EM | Admit: 2012-02-23 | Discharge: 2012-02-23 | Disposition: A | Discharge status: Home / Self Care | Payer: Medicaid Other | Attending: Emergency Medicine | Admitting: Emergency Medicine

## 2012-02-23 ENCOUNTER — Emergency Department (HOSPITAL_COMMUNITY): Payer: Medicaid Other

## 2012-02-23 DIAGNOSIS — E119 Type 2 diabetes mellitus without complications: Secondary | ICD-10-CM | POA: Insufficient documentation

## 2012-02-23 DIAGNOSIS — M25469 Effusion, unspecified knee: Secondary | ICD-10-CM | POA: Insufficient documentation

## 2012-02-23 DIAGNOSIS — M1710 Unilateral primary osteoarthritis, unspecified knee: Secondary | ICD-10-CM | POA: Insufficient documentation

## 2012-02-23 DIAGNOSIS — M1712 Unilateral primary osteoarthritis, left knee: Secondary | ICD-10-CM

## 2012-02-23 DIAGNOSIS — I10 Essential (primary) hypertension: Secondary | ICD-10-CM | POA: Insufficient documentation

## 2012-02-23 DIAGNOSIS — Z79899 Other long term (current) drug therapy: Secondary | ICD-10-CM | POA: Insufficient documentation

## 2012-02-23 MED ORDER — DICLOFENAC SODIUM 75 MG PO TBEC: 75.0000 mg | DELAYED_RELEASE_TABLET | Freq: Two times a day (BID) | ORAL | Status: DC

## 2012-02-23 NOTE — ED Provider Notes (Signed)
Medical screening examination/treatment/procedure(s) were performed by non-physician practitioner and as supervising physician I was immediately available for consultation/collaboration.   Ivon Roedel W. Keimya Briddell, MD 02/23/12 1811 

## 2012-02-23 NOTE — ED Notes (Signed)
Pt presents to department for evaluation of L knee pain. Ongoing for several weeks. Pt denies injury to area. Describes pain as soreness all around knee. No obvious deformity noted. Pt also states intermittent swelling of L foot. 9/10 pain at the time, becomes worse with movement/ambulation. No signs of distress noted at the present.

## 2012-02-23 NOTE — Discharge Instructions (Signed)

## 2012-02-23 NOTE — Progress Notes (Signed)
Orthopedic Tech Progress Note Patient Details:  Crystal [name] Jun 05, 1961 [id number]  Other Ortho Devices Type of Ortho Device: Crutches;Knee Sleeve Ortho Device Location: left knee Ortho Device Interventions: Application   [name] 02/23/2012, 11:04 AM

## 2012-02-23 NOTE — ED Notes (Signed)
Reports left knee pain x 10 days, denies injury to leg. Ambulatory at triage, no distress noted.

## 2012-02-23 NOTE — ED Provider Notes (Signed)
History     CSN: [id number]  Arrival date & time 02/23/12  0836   First MD Initiated Contact with Patient 02/23/12 0916     9:31 AM HPI Patient reports for 4 weeks has had gradually worsening left knee pain. Denies injury or fall. Reports pain is worse with ambulation and better with rest and elevation. Denies erythema, fever, injury.  Patient is a 51 y.o. female presenting with knee pain. The history is provided by the patient.  Knee Pain This is a new problem. Associated symptoms include joint swelling. Pertinent negatives include no abdominal pain, chest pain, chills, fever, headaches, neck pain, numbness or weakness. The symptoms are aggravated by walking and standing. She has tried nothing for the symptoms. The treatment provided no relief.    Past Medical History  Diagnosis Date  . Hypertension   . Diabetes mellitus     History reviewed. No pertinent past surgical history.  History reviewed. No pertinent family history.  History  Substance Use Topics  . Smoking status: Never Smoker   . Smokeless tobacco: Not on file  . Alcohol Use: No    OB History    Grav Para Term Preterm Abortions TAB SAB Ect Mult Living                  Review of Systems  Constitutional: Negative for fever and chills.  HENT: Negative for neck pain and neck stiffness.   Cardiovascular: Negative for chest pain.  Gastrointestinal: Negative for abdominal pain.  Genitourinary: Negative for dysuria, urgency, frequency, hematuria, flank pain and pelvic pain.  Musculoskeletal: Positive for joint swelling. Negative for back pain.       Knee pain. Denies saddle anesthesias, or perineal numbness, urinary or bowel incontinence  Neurological: Negative for weakness, numbness and headaches.    Allergies  Aspirin  Home Medications   Current Outpatient Rx  Name Route Sig Dispense Refill  . ACETAMINOPHEN 500 MG PO TABS Oral Take 1,000 mg by mouth every 6 (six) hours as needed. For pain    .  AMLODIPINE BESYLATE 10 MG PO TABS Oral Take 10 mg by mouth daily.    [name] FLUTICASONE PROPIONATE 50 MCG/ACT NA SUSP Nasal Place 2 sprays into the nose daily as needed. For congestion    . GLIPIZIDE 10 MG PO TABS Oral Take 10 mg by mouth daily.     [name] METFORMIN HCL 500 MG PO TABS Oral Take 500 mg by mouth 2 (two) times daily with a meal.    . ADULT MULTIVITAMIN W/MINERALS CH Oral Take 1 tablet by mouth daily.      BP 150/70  Pulse 82  Temp(Src) 98.3 F (36.8 C) (Oral)  Resp 16  SpO2 98%  Physical Exam  Vitals reviewed. Constitutional: She is oriented to person, place, and time. Vital signs are normal. She appears well-developed and well-nourished. No distress.  HENT:  Head: Normocephalic and atraumatic.  Eyes: Pupils are equal, round, and reactive to light.  Neck: Neck supple.  Pulmonary/Chest: Effort normal.  Musculoskeletal:       Left knee: She exhibits effusion. She exhibits normal range of motion, no ecchymosis and no deformity.       Ambulates with a limp.   Left knee: negative anterior, posterios, valgus and varus. Full ROM, normal sensation and pulses.   Neurological: She is alert and oriented to person, place, and time.  Skin: Skin is warm and dry. No rash noted. No erythema. No pallor.  Psychiatric: She has a normal  mood and affect. Her behavior is normal.    ED Course  Procedures  Results for orders placed during the hospital encounter of 12/06/11  POCT I-STAT, CHEM 8      Component Value Range   Sodium 142  135 - 145 (mEq/L)   Potassium 3.7  3.5 - 5.1 (mEq/L)   Chloride 104  96 - 112 (mEq/L)   BUN 8  6 - 23 (mg/dL)   Creatinine, Ser 1.61  0.50 - 1.10 (mg/dL)   Glucose, Bld 096 (*) 70 - 99 (mg/dL)   Calcium, Ion 0.45  1.12 - 1.32 (mmol/L)   TCO2 27  0 - 100 (mmol/L)   Hemoglobin 13.9  12.0 - 15.0 (g/dL)   HCT 40.9  81.1 - 91.4 (%)   Dg Knee Complete 4 Views Left  02/23/2012  *RADIOLOGY REPORT*  Clinical Data: Knee pain, known injury  LEFT KNEE - COMPLETE 4+  VIEW  Comparison: 03/14/2010  Findings: There is a joint effusion.  No fracture is present.  Mild degenerative changes are present with mild spurring of the patella. Mild medial joint spurring.  IMPRESSION: Mild degenerative changes with a joint effusion.  Original Report Authenticated By: Camelia Phenes, M.D.      MDM    Gave patient a knee sleeve and crutches. Advised followup with primary care provider. Patient voices understanding. Patient given prescription for diclofenac.      Thomasene Lot, PA-C 02/23/12 1148  Thomasene Lot, PA-C 02/23/12 1149

## 2012-05-13 ENCOUNTER — Ambulatory Visit: Payer: Medicaid Other

## 2012-10-14 ENCOUNTER — Ambulatory Visit: Payer: Medicaid Other | Attending: Family Medicine | Admitting: Physical Therapy

## 2012-10-14 DIAGNOSIS — IMO0001 Reserved for inherently not codable concepts without codable children: Secondary | ICD-10-CM | POA: Insufficient documentation

## 2012-10-14 DIAGNOSIS — M25519 Pain in unspecified shoulder: Secondary | ICD-10-CM | POA: Insufficient documentation

## 2012-10-14 DIAGNOSIS — M542 Cervicalgia: Secondary | ICD-10-CM | POA: Insufficient documentation

## 2012-10-24 ENCOUNTER — Emergency Department (HOSPITAL_COMMUNITY)
Admission: EM | Admit: 2012-10-24 | Discharge: 2012-10-24 | Disposition: A | Discharge status: Home / Self Care | Payer: Medicaid Other | Attending: Emergency Medicine | Admitting: Emergency Medicine

## 2012-10-24 ENCOUNTER — Emergency Department (HOSPITAL_COMMUNITY): Payer: Medicaid Other

## 2012-10-24 ENCOUNTER — Encounter (HOSPITAL_COMMUNITY): Payer: Self-pay | Admitting: Emergency Medicine

## 2012-10-24 DIAGNOSIS — R51 Headache: Secondary | ICD-10-CM | POA: Insufficient documentation

## 2012-10-24 DIAGNOSIS — E119 Type 2 diabetes mellitus without complications: Secondary | ICD-10-CM | POA: Insufficient documentation

## 2012-10-24 DIAGNOSIS — M25519 Pain in unspecified shoulder: Secondary | ICD-10-CM | POA: Insufficient documentation

## 2012-10-24 DIAGNOSIS — M25511 Pain in right shoulder: Secondary | ICD-10-CM

## 2012-10-24 DIAGNOSIS — Z79899 Other long term (current) drug therapy: Secondary | ICD-10-CM | POA: Insufficient documentation

## 2012-10-24 DIAGNOSIS — I10 Essential (primary) hypertension: Secondary | ICD-10-CM | POA: Insufficient documentation

## 2012-10-24 MED ORDER — HYDROCODONE-ACETAMINOPHEN 5-325 MG PO TABS: 2.0000 | ORAL_TABLET | ORAL | Status: AC | PRN

## 2012-10-24 MED ORDER — HYDROCODONE-ACETAMINOPHEN 5-325 MG PO TABS
2.0000 | ORAL_TABLET | Freq: Once | ORAL | Status: AC
  Administered 2012-10-24: 2 via ORAL
  Filled 2012-10-24: qty 2

## 2012-10-24 MED ORDER — HYDROCODONE-ACETAMINOPHEN 5-325 MG PO TABS: 2.0000 | ORAL_TABLET | ORAL | Status: DC | PRN

## 2012-10-24 NOTE — ED Provider Notes (Signed)
Medical screening examination/treatment/procedure(s) were performed by non-physician practitioner and as supervising physician I was immediately available for consultation/collaboration.    Nelia Shi, MD 10/24/12 [phone]

## 2012-10-24 NOTE — ED Provider Notes (Signed)
History     CSN: [id number]  Arrival date & time 10/24/12  1025   First MD Initiated Contact with Patient 10/24/12 1303      Chief Complaint  Patient presents with  . Hypertension  . Headache  . Shoulder Pain    (Consider location/radiation/quality/duration/timing/severity/associated sxs/prior treatment) Patient is a 52 y.o. female presenting with shoulder pain. The history is provided by the patient. No language interpreter was used.  Shoulder Pain This is a new problem. Episode onset: 2  The problem occurs constantly. The problem has been gradually worsening. Associated symptoms include headaches. Nothing aggravates the symptoms. She has tried nothing for the symptoms. The treatment provided moderate relief.  Pt complains of pain in her right shoulder and her back.  Pt reports pain is worse with moving shoulder.  No injury known.   Pt has a history of high blood pressure and diabetes  Past Medical History  Diagnosis Date  . Hypertension   . Diabetes mellitus     History reviewed. No pertinent past surgical history.  History reviewed. No pertinent family history.  History  Substance Use Topics  . Smoking status: Never Smoker   . Smokeless tobacco: Not on file  . Alcohol Use: No    OB History    Grav Para Term Preterm Abortions TAB SAB Ect Mult Living                  Review of Systems  Neurological: Positive for headaches.  All other systems reviewed and are negative.    Allergies  Aspirin  Home Medications   Current Outpatient Rx  Name  Route  Sig  Dispense  Refill  . AMLODIPINE BESYLATE 10 MG PO TABS   Oral   Take 10 mg by mouth daily.         [name] DICLOFENAC SODIUM 75 MG PO TBEC   Oral   Take 75 mg by mouth 2 (two) times daily as needed. For pain         . FLUTICASONE PROPIONATE 50 MCG/ACT NA SUSP   Nasal   Place 2 sprays into the nose daily as needed. For congestion         . GLIPIZIDE 10 MG PO TABS   Oral   Take 10 mg by mouth 2 (two)  times daily before a meal.          . ADULT MULTIVITAMIN W/MINERALS CH   Oral   Take 1 tablet by mouth daily.           BP 142/69  Pulse 67  Temp 98 F (36.7 C) (Oral)  Resp 18  SpO2 99%  Physical Exam  Nursing note and vitals reviewed. Constitutional: She appears well-developed and well-nourished.  HENT:  Head: Normocephalic and atraumatic.  Right Ear: External ear normal.  Left Ear: External ear normal.  Eyes: Conjunctivae normal and EOM are normal. Pupils are equal, round, and reactive to light.  Neck: Normal range of motion. Neck supple.  Cardiovascular: Normal rate and normal heart sounds.   Pulmonary/Chest: Effort normal and breath sounds normal.  Abdominal: Soft.  Musculoskeletal: She exhibits tenderness.       Tender right shoulder scapula area and anterior shoulder,  Pain with movement.     Neurological: She is alert.  Skin: Skin is warm.    ED Course  Procedures (including critical care time)  Labs Reviewed - No data to display No results found.   No diagnosis found.    MDM  Date: 10/24/2012  Rate: 69  Rhythm: normal sinus rhythm  QRS Axis: normal  Intervals: normal  ST/T Wave abnormalities: normal  Conduction Disutrbances:none  Narrative Interpretation:   Old EKG Reviewed: none available         Elson Areas, PA 10/24/12 1421  Lonia Skinner [place], [place] 10/24/12 1525

## 2012-10-24 NOTE — ED Notes (Signed)
Pt c/o htn, HA and right shoulder pain x 2 days

## 2012-10-26 ENCOUNTER — Ambulatory Visit: Payer: Medicaid Other | Admitting: Physical Therapy

## 2012-10-30 ENCOUNTER — Ambulatory Visit: Payer: Medicaid Other | Admitting: Physical Therapy

## 2012-11-03 ENCOUNTER — Ambulatory Visit: Payer: Medicaid Other | Admitting: Physical Therapy

## 2012-11-11 ENCOUNTER — Other Ambulatory Visit: Payer: Self-pay | Admitting: Internal Medicine

## 2012-11-16 ENCOUNTER — Ambulatory Visit: Payer: Medicaid Other | Attending: Family Medicine | Admitting: Rehabilitative and Restorative Service Providers"

## 2012-11-16 DIAGNOSIS — M25519 Pain in unspecified shoulder: Secondary | ICD-10-CM | POA: Insufficient documentation

## 2012-11-16 DIAGNOSIS — IMO0001 Reserved for inherently not codable concepts without codable children: Secondary | ICD-10-CM | POA: Insufficient documentation

## 2012-11-16 DIAGNOSIS — M542 Cervicalgia: Secondary | ICD-10-CM | POA: Insufficient documentation

## 2012-11-24 ENCOUNTER — Ambulatory Visit: Payer: Medicaid Other

## 2012-12-01 ENCOUNTER — Ambulatory Visit: Payer: Medicaid Other

## 2012-12-01 ENCOUNTER — Ambulatory Visit: Payer: Medicaid Other | Admitting: Physical Therapy

## 2013-01-13 ENCOUNTER — Encounter (INDEPENDENT_AMBULATORY_CARE_PROVIDER_SITE_OTHER): Payer: Self-pay | Admitting: Surgery

## 2013-01-27 ENCOUNTER — Ambulatory Visit (INDEPENDENT_AMBULATORY_CARE_PROVIDER_SITE_OTHER): Payer: Medicaid Other | Admitting: Surgery

## 2013-01-27 ENCOUNTER — Other Ambulatory Visit (INDEPENDENT_AMBULATORY_CARE_PROVIDER_SITE_OTHER): Payer: Self-pay | Admitting: Surgery

## 2013-01-27 ENCOUNTER — Encounter (INDEPENDENT_AMBULATORY_CARE_PROVIDER_SITE_OTHER): Payer: Self-pay | Admitting: Surgery

## 2013-01-27 VITALS — BP 144/72 | HR 88 | Temp 98.0°F | Resp 16 | Ht 66.0 in | Wt 167.0 lb

## 2013-01-27 DIAGNOSIS — E042 Nontoxic multinodular goiter: Secondary | ICD-10-CM | POA: Insufficient documentation

## 2013-01-27 LAB — T3: T3, Total: 80.6 ng/dL (ref 80.0–204.0)

## 2013-01-27 LAB — TSH: TSH: 0.148 u[IU]/mL — ABNORMAL LOW (ref 0.350–4.500)

## 2013-01-27 LAB — T4: T4, Total: 6.3 ug/dL (ref 5.0–12.5)

## 2013-01-27 NOTE — Patient Instructions (Signed)
Thyroid Biopsy The thyroid gland is a butterfly-shaped gland situated in the front of the neck. It produces hormones which affect metabolism, growth and development, and body temperature. A thyroid biopsy is a procedure in which small samples of tissue or fluid are removed from the thyroid gland or mass and examined under a microscope. This test is done to determine the cause of thyroid problems, such as infection, cancer, or other thyroid problems. There are 2 ways to obtain samples: 1. Fine needle biopsy. Samples are removed using a thin needle inserted through the skin and into the thyroid gland or mass. 2. Open biopsy. Samples are removed after a cut (incision) is made through the skin. LET YOUR CAREGIVER KNOW ABOUT:   Allergies.  Medications taken including herbs, eye drops, over-the-counter medications, and creams.  Use of steroids (by mouth or creams).  Previous problems with anesthetics or numbing medicine.  Possibility of pregnancy, if this applies.  History of blood clots (thrombophlebitis).  History of bleeding or blood problems.  Previous surgery.  Other health problems. RISKS AND COMPLICATIONS  Bleeding from the site. The risk of bleeding is higher if you have a bleeding disorder or are taking any blood thinning medications (anticoagulants).  Infection.  Injury to structures near the thyroid gland. BEFORE THE PROCEDURE  This is a procedure that can be done as an outpatient. Confirm the time that you need to arrive for your procedure. Confirm whether there is a need to fast or withhold any medications. A blood sample may be done to determine your blood clotting time. Medicine may be given to help you relax (sedative). PROCEDURE Fine needle biopsy. You will be awake during the procedure. You may be asked to lie on your back with your head tipped backward to extend your neck. Let your caregiver know if you cannot tolerate the positioning. An area on your neck will be  cleansed. A needle is inserted through the skin of your neck. You may feel a mild discomfort during this procedure. You may be asked to avoid coughing, talking, swallowing, or making sounds during some portions of the procedure. The needle is withdrawn once tissue or fluid samples have been removed. Pressure may be applied to the neck to reduce swelling and ensure that bleeding has stopped. The samples will be sent for examination.  Open biopsy. You will be given general anesthesia. You will be asleep during the procedure. An incision is made in your neck. A sample of thyroid tissue or the mass is removed. The tissue sample or mass will be sent for examination. The sample or mass may be examined during the biopsy. If the sample or mass contains cancer cells, some or all of the thyroid gland may be removed. The incision is closed with stitches. AFTER THE PROCEDURE  Your recovery will be assessed and monitored. If there are no problems, as an outpatient, you should be able to go home shortly after the procedure. If you had a fine needle biopsy:  You may have soreness at the biopsy site for 1 to 2 days. If you had an open biopsy:   You may have soreness at the biopsy site for 3 to 4 days.  You may have a hoarse voice or sore throat for 1 to 2 days. Obtaining the Test Results It is your responsibility to obtain your test results. Do not assume everything is normal if you have not heard from your caregiver or the medical facility. It is important for you to follow up   on all of your test results. HOME CARE INSTRUCTIONS   Keeping your head raised on a pillow when you are lying down may ease biopsy site discomfort.  Supporting the back of your head and neck with both hands as you sit up from a lying position may ease biopsy site discomfort.  Only take over-the-counter or prescription medicines for pain, discomfort, or fever as directed by your caregiver.  Throat lozenges or gargling with warm salt  water may help to soothe a sore throat. SEEK IMMEDIATE MEDICAL CARE IF:   You have severe bleeding from the biopsy site.  You have difficulty swallowing.  You have a fever.  You have increased pain, swelling, redness, or warmth at the biopsy site.  You notice pus coming from the biopsy site.  You have swollen glands (lymph nodes) in your neck. Document Released: 07/21/2007 Document Revised: 12/16/2011 Document Reviewed: 12/21/2008 ExitCare Patient Information 2013 [employer].  

## 2013-01-27 NOTE — Progress Notes (Signed)
General Surgery [facility]  Chief Complaint  Patient presents with  . New Evaluation    newly diagnosed thyroid nodules - referral from Dr. Allie Bossier    HISTORY: Patient is a 52 year old black female from [place] who presents on referral by her orthopedic surgeon for evaluation of newly diagnosed thyroid nodules. Patient has a history of thyroid disease dating back at least 20 years. She was diagnosed at that time with thyroid nodules. She underwent surgery in [place].  It appears likely that she underwent left thyroid lobectomy. She had no further treatment. She denies any treatment with radioactive iodine. She has never been on thyroid medication.  Patient has a cervical disc which is causing pain into the right shoulder. She underwent an MRI scan on 12/24/2012. This showed 2 large nodules in the right thyroid lobe measuring 2.8 cm and 2.2 cm respectively. No appreciable left thyroid lobe was identified on the MRI scan. There was tracheal deviation to the left due to the masses in the right thyroid lobe.  Past Medical History  Diagnosis Date  . Hypertension   . Diabetes mellitus      Current Outpatient Prescriptions  Medication Sig Dispense Refill  . amLODipine (NORVASC) 10 MG tablet Take 10 mg by mouth daily.      [name] glipiZIDE (GLUCOTROL) 10 MG tablet Take 10 mg by mouth 2 (two) times daily before a meal.       . Multiple Vitamin (MULITIVITAMIN WITH MINERALS) TABS Take 1 tablet by mouth daily.       No current facility-administered medications for this visit.     Allergies  Allergen Reactions  . Aspirin Nausea And Vomiting     History reviewed. No pertinent family history.   History   Social History  . Marital Status: Married    Spouse Name: N/A    Number of Children: N/A  . Years of Education: N/A   Social History Main Topics  . Smoking status: Never Smoker   . Smokeless tobacco: None  . Alcohol Use: No  . Drug Use: No  . Sexually Active:  None   Other Topics Concern  . None   Social History Narrative  . None     REVIEW OF SYSTEMS - PERTINENT POSITIVES ONLY: Denies pain. Denies dysphagia. Denies other compressive symptoms. Denies tremor. Denies palpitations.  EXAM: Filed Vitals:   01/27/13 1141  BP: 144/72  Pulse: 88  Temp: 98 F (36.7 C)  Resp: 16    HEENT: normocephalic; pupils equal and reactive; sclerae clear; dentition fair; mucous membranes moist NECK:  Obvious dominant masses involving the inferior pole and superior pole of the right thyroid lobe, each measuring approximately 3 cm in size, mobile, and nontender; no palpable abnormality in the left thyroid bed; well-healed incision at the level of the clavicular heads; asymmetric on extension; no palpable anterior or posterior cervical lymphadenopathy; no supraclavicular masses; no tenderness CHEST: clear to auscultation bilaterally without rales, rhonchi, or wheezes CARDIAC: regular rate and rhythm without significant murmur; peripheral pulses are full EXT:  non-tender without edema; no deformity NEURO: no gross focal deficits; no sign of tremor   LABORATORY RESULTS: See [facility] (CHL-Epic) for most recent results   RADIOLOGY RESULTS: See [facility] (CHL-Epic) for most recent results   IMPRESSION: #1 dominant nodules right thyroid lobe, 2.8 cm and 2.2 cm, by MRI scan #2 personal history of thyroid surgery, likely left thyroid lobectomy, pathologic results unknown #3 cervical disc disease, symptomatic  PLAN:  I had a lengthy discussion with the patient and her husband. We reviewed the above studies. We discussed her history at length.  I would like to obtain a thyroid ultrasound. We will ask them to perform ultrasound-guided fine-needle aspiration biopsy concurrent with this study. I would like to have both of the dominant nodules aspirated for cytology. We will also obtain a TSH level, a T3 level, and a T4 level. I will contact the  patient with the results of these studies.  According to the patient, they are planning on surgery for her cervical disc disease in the near future. Apparently they would like to coordinate possible thyroid surgery with this procedure. We will obtain the above diagnostic studies and then discussed this with the patient and the orthopedic surgeons.  Velora Heckler, MD, FACS General & Endocrine Surgery [facility]   Visit Diagnoses: 1. Multiple thyroid nodules     Primary Care Physician: Lonia Blood, MD

## 2013-02-02 ENCOUNTER — Other Ambulatory Visit (HOSPITAL_COMMUNITY)
Admission: RE | Admit: 2013-02-02 | Discharge: 2013-02-02 | Disposition: A | Discharge status: Home / Self Care | Payer: Medicaid Other | Source: Ambulatory Visit | Attending: Interventional Radiology | Admitting: Interventional Radiology

## 2013-02-02 ENCOUNTER — Other Ambulatory Visit (INDEPENDENT_AMBULATORY_CARE_PROVIDER_SITE_OTHER): Payer: Self-pay | Admitting: Surgery

## 2013-02-02 ENCOUNTER — Ambulatory Visit
Admission: RE | Admit: 2013-02-02 | Discharge: 2013-02-02 | Disposition: A | Discharge status: Home / Self Care | Payer: Medicaid Other | Source: Ambulatory Visit | Attending: Surgery | Admitting: Surgery

## 2013-02-02 DIAGNOSIS — E042 Nontoxic multinodular goiter: Secondary | ICD-10-CM

## 2013-02-02 DIAGNOSIS — E049 Nontoxic goiter, unspecified: Secondary | ICD-10-CM | POA: Insufficient documentation

## 2013-02-04 NOTE — Progress Notes (Signed)
Quick Note:  Please contact patient and notify of benign pathology results.  Audry Kauzlarich M. Toran Murch, MD, FACS [facility] Office: [phone]   ______ 

## 2013-02-04 NOTE — Progress Notes (Signed)
Quick Note:  Please contact patient and notify of benign pathology results.  Tabb Croghan M. Lanika Colgate, MD, FACS [facility] Office: [phone]   ______ 

## 2013-02-08 ENCOUNTER — Telehealth (INDEPENDENT_AMBULATORY_CARE_PROVIDER_SITE_OTHER): Payer: Self-pay

## 2013-02-08 NOTE — Telephone Encounter (Signed)
Pt notified of benign pathology results.  She tried to explain to me that she wanted to come back to see Dr. Gerrit Friends later in the summer.  There was a language problem, so I am not sure exactly why the patient wanted her appt.  You may want to give her a call.

## 2013-02-18 ENCOUNTER — Encounter (INDEPENDENT_AMBULATORY_CARE_PROVIDER_SITE_OTHER): Payer: Self-pay

## 2013-03-10 ENCOUNTER — Telehealth (INDEPENDENT_AMBULATORY_CARE_PROVIDER_SITE_OTHER): Payer: Self-pay

## 2013-03-10 NOTE — Telephone Encounter (Signed)
Unable to reach pt or leave msg re: appt. Pt will need to be referred back by PCP due to [facility] requiring ref auth prior to appt..  Dr Gerrit Friends will see pt later in summer if pt wants appt. And can get ref. auth from PCP.

## 2013-05-06 ENCOUNTER — Telehealth (INDEPENDENT_AMBULATORY_CARE_PROVIDER_SITE_OTHER): Payer: Self-pay | Admitting: General Surgery

## 2013-05-06 NOTE — Telephone Encounter (Signed)
Pt called in to ask about lab work from 01/2013.  Explained that her TSH was a low.  She asked to have another appt w/ Dr. Gerrit Friends.  I explained that we need pre-auth from her PCP first because of her medicaid.  As soon as her PCP gives [place] that authorization, we can make her that appt.  She said she understood.

## 2013-06-02 ENCOUNTER — Encounter (HOSPITAL_COMMUNITY): Payer: Self-pay | Admitting: Emergency Medicine

## 2013-06-02 ENCOUNTER — Emergency Department (HOSPITAL_COMMUNITY)
Admission: EM | Admit: 2013-06-02 | Discharge: 2013-06-03 | Disposition: A | Discharge status: Home / Self Care | Payer: Medicaid Other | Attending: Emergency Medicine | Admitting: Emergency Medicine

## 2013-06-02 DIAGNOSIS — I10 Essential (primary) hypertension: Secondary | ICD-10-CM | POA: Insufficient documentation

## 2013-06-02 DIAGNOSIS — E119 Type 2 diabetes mellitus without complications: Secondary | ICD-10-CM | POA: Insufficient documentation

## 2013-06-02 DIAGNOSIS — Z79899 Other long term (current) drug therapy: Secondary | ICD-10-CM | POA: Insufficient documentation

## 2013-06-02 DIAGNOSIS — Z3202 Encounter for pregnancy test, result negative: Secondary | ICD-10-CM | POA: Insufficient documentation

## 2013-06-02 DIAGNOSIS — K5732 Diverticulitis of large intestine without perforation or abscess without bleeding: Secondary | ICD-10-CM | POA: Insufficient documentation

## 2013-06-02 DIAGNOSIS — R110 Nausea: Secondary | ICD-10-CM | POA: Insufficient documentation

## 2013-06-02 LAB — CBC WITH DIFFERENTIAL/PLATELET
Basophils Absolute: 0 10*3/uL (ref 0.0–0.1)
Basophils Relative: 0 % (ref 0–1)
Eosinophils Absolute: 0.2 10*3/uL (ref 0.0–0.7)
Eosinophils Relative: 2 % (ref 0–5)
HCT: 37.7 % (ref 36.0–46.0)
Hemoglobin: 12.6 g/dL (ref 12.0–15.0)
Lymphocytes Relative: 13 % (ref 12–46)
Lymphs Abs: 1.5 10*3/uL (ref 0.7–4.0)
MCH: 26.3 pg (ref 26.0–34.0)
MCHC: 33.4 g/dL (ref 30.0–36.0)
MCV: 78.7 fL (ref 78.0–100.0)
Monocytes Absolute: 0.8 10*3/uL (ref 0.1–1.0)
Monocytes Relative: 7 % (ref 3–12)
Neutro Abs: 9.1 10*3/uL — ABNORMAL HIGH (ref 1.7–7.7)
Neutrophils Relative %: 79 % — ABNORMAL HIGH (ref 43–77)
Platelets: 336 10*3/uL (ref 150–400)
RBC: 4.79 MIL/uL (ref 3.87–5.11)
RDW: 14.5 % (ref 11.5–15.5)
WBC: 11.6 10*3/uL — ABNORMAL HIGH (ref 4.0–10.5)

## 2013-06-02 LAB — URINALYSIS, ROUTINE W REFLEX MICROSCOPIC
Bilirubin Urine: NEGATIVE
Glucose, UA: 250 mg/dL — AB
Ketones, ur: NEGATIVE mg/dL
Leukocytes, UA: NEGATIVE
Nitrite: NEGATIVE
Protein, ur: NEGATIVE mg/dL
Specific Gravity, Urine: 1.009 (ref 1.005–1.030)
Urobilinogen, UA: 0.2 mg/dL (ref 0.0–1.0)
pH: 6 (ref 5.0–8.0)

## 2013-06-02 LAB — POCT PREGNANCY, URINE: Preg Test, Ur: NEGATIVE

## 2013-06-02 LAB — COMPREHENSIVE METABOLIC PANEL
ALT: 20 U/L (ref 0–35)
AST: 19 U/L (ref 0–37)
Albumin: 3.1 g/dL — ABNORMAL LOW (ref 3.5–5.2)
Alkaline Phosphatase: 87 U/L (ref 39–117)
BUN: 10 mg/dL (ref 6–23)
CO2: 25 mEq/L (ref 19–32)
Calcium: 8.9 mg/dL (ref 8.4–10.5)
Chloride: 101 mEq/L (ref 96–112)
Creatinine, Ser: 0.32 mg/dL — ABNORMAL LOW (ref 0.50–1.10)
GFR calc Af Amer: >90 mL/min (ref >90)
GFR calc non Af Amer: >90 mL/min (ref >90)
Glucose, Bld: 286 mg/dL — ABNORMAL HIGH (ref 70–99)
Potassium: 3.6 mEq/L (ref 3.5–5.1)
Sodium: 137 mEq/L (ref 135–145)
Total Bilirubin: 0.3 mg/dL (ref 0.3–1.2)
Total Protein: 7 g/dL (ref 6.0–8.3)

## 2013-06-02 LAB — LIPASE, BLOOD: Lipase: 42 U/L (ref 11–59)

## 2013-06-02 LAB — URINE MICROSCOPIC-ADD ON

## 2013-06-02 MED ORDER — HYDROMORPHONE HCL PF 1 MG/ML IJ SOLN
1.0000 mg | Freq: Once | INTRAMUSCULAR | Status: AC
  Administered 2013-06-03: 1 mg via INTRAVENOUS
  Filled 2013-06-02: qty 1

## 2013-06-02 MED ORDER — ONDANSETRON HCL 4 MG/2ML IJ SOLN
4.0000 mg | Freq: Once | INTRAMUSCULAR | Status: AC
  Administered 2013-06-03: 4 mg via INTRAVENOUS
  Filled 2013-06-02: qty 2

## 2013-06-02 NOTE — ED Notes (Signed)
PT. REPORTS LEFT AND LOWER ABDOMINAL PAIN ONSET 3 DAYS AGO WITH SLIGHT NAUSEA , DENIES EMESIS OR DIARRHEA . NO FEVER OR CHILLS.

## 2013-06-02 NOTE — ED Provider Notes (Signed)
CSN: [id number]     Arrival date & time 06/02/13  2154 History   First MD Initiated Contact with Patient 06/02/13 2343     Chief Complaint  Patient presents with  . Abdominal Pain   (Consider location/radiation/quality/duration/timing/severity/associated sxs/prior Treatment) Patient is a 52 y.o. female presenting with abdominal pain. The history is provided by the patient.  Abdominal Pain She has been having abdominal pain for the last 3 days which got worse today. Pain is crampy in nature and located in the left upper quadrant, left lower quadrant, and right lower cautery to. She denies any right upper quadrant pain. Pain does not radiate to the chest or back. There has been nausea but no vomiting. She denies constipation or diarrhea. Denies urinary urgency, frequency, tenesmus, dysuria. She denies fever, chills, sweats. Acetaminophen and gave temporary relief of pain. She's not had pain like this before. Nothing seemed to make it any worse and only acetaminophen made it any better.  Past Medical History  Diagnosis Date  . Hypertension   . Diabetes mellitus    History reviewed. No pertinent past surgical history. No family history on file. History  Substance Use Topics  . Smoking status: Never Smoker   . Smokeless tobacco: Not on file  . Alcohol Use: No   OB History   Grav Para Term Preterm Abortions TAB SAB Ect Mult Living                 Review of Systems  Gastrointestinal: Positive for abdominal pain.  All other systems reviewed and are negative.    Allergies  Aspirin  Home Medications   Current Outpatient Rx  Name  Route  Sig  Dispense  Refill  . amLODipine (NORVASC) 10 MG tablet   Oral   Take 10 mg by mouth daily.         [name] glipiZIDE (GLUCOTROL) 10 MG tablet   Oral   Take 10 mg by mouth 2 (two) times daily before a meal.          . Multiple Vitamin (MULITIVITAMIN WITH MINERALS) TABS   Oral   Take 1 tablet by mouth daily.          BP 149/71  Pulse  80  Temp(Src) 98.4 F (36.9 C) (Oral)  Resp 17  Ht 5\' 1"  (1.549 m)  Wt 175 lb (79.379 kg)  BMI 33.08 kg/m2  SpO2 96%  LMP 06/02/2013 Physical Exam  Nursing note and vitals reviewed.  52 year old female, resting comfortably and in no acute distress. Vital signs are significant for hypertension with blood pressure 149/71. Oxygen saturation is 96%, which is normal. Head is normocephalic and atraumatic. PERRLA, EOMI. Oropharynx is clear. Neck is nontender and supple without adenopathy or JVD. Back is nontender and there is no CVA tenderness. Lungs are clear without rales, wheezes, or rhonchi. Chest is nontender. Heart has regular rate and rhythm without murmur. Abdomen is soft, flat, with mild to moderate tenderness in the left upper quadrant, left lower quadrant, and right lower quadrant. There is no rebound or guarding. There are no masses or hepatosplenomegaly and peristalsis is normoactive. Extremities have no cyanosis or edema, full range of motion is present. Skin is warm and dry without rash. Neurologic: Mental status is normal, cranial nerves are intact, there are no motor or sensory deficits.  ED Course  Procedures (including critical care time) Results for orders placed during the hospital encounter of 06/02/13  CBC WITH DIFFERENTIAL  Result Value Range   WBC 11.6 (*) 4.0 - 10.5 K/uL   RBC 4.79  3.87 - 5.11 MIL/uL   Hemoglobin 12.6  12.0 - 15.0 g/dL   HCT 04.5  40.9 - 81.1 %   MCV 78.7  78.0 - 100.0 fL   MCH 26.3  26.0 - 34.0 pg   MCHC 33.4  30.0 - 36.0 g/dL   RDW 91.4  78.2 - 95.6 %   Platelets 336  150 - 400 K/uL   Neutrophils Relative % 79 (*) 43 - 77 %   Neutro Abs 9.1 (*) 1.7 - 7.7 K/uL   Lymphocytes Relative 13  12 - 46 %   Lymphs Abs 1.5  0.7 - 4.0 K/uL   Monocytes Relative 7  3 - 12 %   Monocytes Absolute 0.8  0.1 - 1.0 K/uL   Eosinophils Relative 2  0 - 5 %   Eosinophils Absolute 0.2  0.0 - 0.7 K/uL   Basophils Relative 0  0 - 1 %   Basophils Absolute  0.0  0.0 - 0.1 K/uL  COMPREHENSIVE METABOLIC PANEL      Result Value Range   Sodium 137  135 - 145 mEq/L   Potassium 3.6  3.5 - 5.1 mEq/L   Chloride 101  96 - 112 mEq/L   CO2 25  19 - 32 mEq/L   Glucose, Bld 286 (*) 70 - 99 mg/dL   BUN 10  6 - 23 mg/dL   Creatinine, Ser 2.13 (*) 0.50 - 1.10 mg/dL   Calcium 8.9  8.4 - 08.6 mg/dL   Total Protein 7.0  6.0 - 8.3 g/dL   Albumin 3.1 (*) 3.5 - 5.2 g/dL   AST 19  0 - 37 U/L   ALT 20  0 - 35 U/L   Alkaline Phosphatase 87  39 - 117 U/L   Total Bilirubin 0.3  0.3 - 1.2 mg/dL   GFR calc non Af Amer >90  >90 mL/min   GFR calc Af Amer >90  >90 mL/min  LIPASE, BLOOD      Result Value Range   Lipase 42  11 - 59 U/L  URINALYSIS, ROUTINE W REFLEX MICROSCOPIC      Result Value Range   Color, Urine YELLOW  YELLOW   APPearance CLEAR  CLEAR   Specific Gravity, Urine 1.009  1.005 - 1.030   pH 6.0  5.0 - 8.0   Glucose, UA 250 (*) NEGATIVE mg/dL   Hgb urine dipstick LARGE (*) NEGATIVE   Bilirubin Urine NEGATIVE  NEGATIVE   Ketones, ur NEGATIVE  NEGATIVE mg/dL   Protein, ur NEGATIVE  NEGATIVE mg/dL   Urobilinogen, UA 0.2  0.0 - 1.0 mg/dL   Nitrite NEGATIVE  NEGATIVE   Leukocytes, UA NEGATIVE  NEGATIVE  URINE MICROSCOPIC-ADD ON      Result Value Range   Squamous Epithelial / LPF RARE  RARE   WBC, UA 0-2  <3 WBC/hpf   RBC / HPF 11-20  <3 RBC/hpf   Bacteria, UA FEW (*) RARE  POCT PREGNANCY, URINE      Result Value Range   Preg Test, Ur NEGATIVE  NEGATIVE   Ct Abdomen Pelvis W Contrast  06/03/2013   *RADIOLOGY REPORT*  Clinical Data: Left lower abdominal pain  CT ABDOMEN AND PELVIS WITH CONTRAST  Technique:  Multidetector CT imaging of the abdomen and pelvis was performed following the standard protocol during bolus administration of intravenous contrast.  Contrast: OMNIPAQUE IOHEXOL 300 MG/ML  SOLN  Comparison: 08/03/2010  Findings: Lung bases predominately clear.  Heart size upper normal to mildly enlarged.  Asymmetric medial left breast  soft tissue on image 1.  Hepatic steatosis.  Unremarkable biliary system, spleen, pancreas, adrenal glands.  Too small to further characterize hypodensity within each kidney. Otherwise, symmetric renal enhancement.  No hydroureteronephrosis.  Colonic diverticulosis.  Thickened segment of sigmoid colon with pericolonic fat stranding and ill-defined fluid.  Normal appendix. Normal course and caliber small bowel loops.  Tiny hiatal hernia. No free intraperitoneal air or loculated fluid collection.  Massive fibroid uterus.  Thin-walled bladder.  No adnexal mass. Incompletely imaged labial cyst.  Multilevel degenerative changes.  Disc/osteophyte complexes at L2-3 and L3-4 result in moderate to severe central canal narrowing.  IMPRESSION: Findings are most in keeping with acute sigmoid colon diverticulitis.  Recommend a colonoscopy follow-up when symptoms resolve to exclude an underlying mass.  Incompletely imaged left breast mass or asymmetric tissue. Correlate with mammogram.  Fibroid uterus.  Hepatic steatosis.  Too small to further characterize hypodensity within each kidney.   Original Report Authenticated By: Jearld Lesch, M.D.   Images viewed by me.  MDM   1. Sigmoid diverticulitis    Abdominal pain in a pattern that seems most consistent with diverticulitis. She will be sent for CT scan to further evaluate this. Old records are reviewed and she has been evaluated for her thyroid nodules which appeared to be a multinodular goiter. CT scan in December 2010 did show diverticulitis. She had an ED visit in October 2011 of with similar presentation at which time CT scan only showed diverticulosis.  CT confirms presence of diverticulitis. Radiologist recommends followup colonoscopy to exclude underlying mass. Patient has no indication for inpatient care. She is discharged with prescriptions for Cipro ofloxacin, metronidazole, ondansetron, and oxycodone-acetaminophen.  Dione Booze, MD 06/03/13 0430

## 2013-06-03 ENCOUNTER — Encounter (HOSPITAL_COMMUNITY): Payer: Self-pay | Admitting: Radiology

## 2013-06-03 ENCOUNTER — Emergency Department (HOSPITAL_COMMUNITY): Payer: Medicaid Other

## 2013-06-03 MED ORDER — METRONIDAZOLE IN NACL 5-0.79 MG/ML-% IV SOLN
500.0000 mg | Freq: Once | INTRAVENOUS | Status: AC
  Administered 2013-06-03: 500 mg via INTRAVENOUS
  Filled 2013-06-03: qty 100

## 2013-06-03 MED ORDER — IOHEXOL 300 MG/ML  SOLN
25.0000 mL | INTRAMUSCULAR | Status: AC
  Administered 2013-06-03: 25 mL via ORAL

## 2013-06-03 MED ORDER — CIPROFLOXACIN HCL 500 MG PO TABS: 500.0000 mg | ORAL_TABLET | Freq: Two times a day (BID) | ORAL | Status: DC

## 2013-06-03 MED ORDER — METRONIDAZOLE 500 MG PO TABS: 500.0000 mg | ORAL_TABLET | Freq: Three times a day (TID) | ORAL | Status: DC

## 2013-06-03 MED ORDER — CIPROFLOXACIN IN D5W 400 MG/200ML IV SOLN
400.0000 mg | Freq: Once | INTRAVENOUS | Status: AC
  Administered 2013-06-03: 400 mg via INTRAVENOUS
  Filled 2013-06-03: qty 200

## 2013-06-03 MED ORDER — IOHEXOL 300 MG/ML  SOLN
100.0000 mL | Freq: Once | INTRAMUSCULAR | Status: AC | PRN
  Administered 2013-06-03: 100 mL via INTRAVENOUS

## 2013-06-03 MED ORDER — OXYCODONE-ACETAMINOPHEN 5-325 MG PO TABS: 1.0000 | ORAL_TABLET | ORAL | Status: DC | PRN

## 2013-06-03 MED ORDER — ONDANSETRON HCL 4 MG/2ML IJ SOLN
4.0000 mg | Freq: Once | INTRAMUSCULAR | Status: AC
  Administered 2013-06-03: 4 mg via INTRAVENOUS
  Filled 2013-06-03: qty 2

## 2013-06-03 MED ORDER — ONDANSETRON HCL 4 MG PO TABS: 4.0000 mg | ORAL_TABLET | Freq: Four times a day (QID) | ORAL | Status: DC | PRN

## 2013-06-03 NOTE — ED Notes (Signed)
Ambulated to restroom  

## 2013-06-03 NOTE — ED Notes (Signed)
Patient transported to CT 

## 2013-06-03 NOTE — ED Notes (Signed)
Patient transported to CT, returned to room15

## 2013-06-21 ENCOUNTER — Other Ambulatory Visit: Payer: Self-pay | Admitting: Internal Medicine

## 2013-06-21 DIAGNOSIS — N644 Mastodynia: Secondary | ICD-10-CM

## 2013-06-25 ENCOUNTER — Ambulatory Visit
Admission: RE | Admit: 2013-06-25 | Discharge: 2013-06-25 | Disposition: A | Discharge status: Home / Self Care | Payer: Medicaid Other | Source: Ambulatory Visit | Attending: Internal Medicine | Admitting: Internal Medicine

## 2013-06-25 DIAGNOSIS — N644 Mastodynia: Secondary | ICD-10-CM

## 2014-01-13 ENCOUNTER — Emergency Department (HOSPITAL_COMMUNITY)
Admission: EM | Admit: 2014-01-13 | Discharge: 2014-01-13 | Disposition: A | Discharge status: Home / Self Care | Payer: Medicaid Other | Attending: Emergency Medicine | Admitting: Emergency Medicine

## 2014-01-13 ENCOUNTER — Encounter (HOSPITAL_COMMUNITY): Payer: Self-pay | Admitting: Emergency Medicine

## 2014-01-13 DIAGNOSIS — M549 Dorsalgia, unspecified: Secondary | ICD-10-CM | POA: Insufficient documentation

## 2014-01-13 DIAGNOSIS — E119 Type 2 diabetes mellitus without complications: Secondary | ICD-10-CM | POA: Insufficient documentation

## 2014-01-13 DIAGNOSIS — R300 Dysuria: Secondary | ICD-10-CM | POA: Insufficient documentation

## 2014-01-13 DIAGNOSIS — I10 Essential (primary) hypertension: Secondary | ICD-10-CM | POA: Insufficient documentation

## 2014-01-13 DIAGNOSIS — Z79899 Other long term (current) drug therapy: Secondary | ICD-10-CM | POA: Insufficient documentation

## 2014-01-13 LAB — URINALYSIS, ROUTINE W REFLEX MICROSCOPIC
Bilirubin Urine: NEGATIVE
Glucose, UA: 250 mg/dL — AB
Hgb urine dipstick: NEGATIVE
Ketones, ur: NEGATIVE mg/dL
Leukocytes, UA: NEGATIVE
Nitrite: NEGATIVE
Protein, ur: NEGATIVE mg/dL
Specific Gravity, Urine: 1.027 (ref 1.005–1.030)
Urobilinogen, UA: 0.2 mg/dL (ref 0.0–1.0)
pH: 7 (ref 5.0–8.0)

## 2014-01-13 MED ORDER — TRAMADOL HCL 50 MG PO TABS: 50.0000 mg | ORAL_TABLET | Freq: Four times a day (QID) | ORAL | Status: DC | PRN

## 2014-01-13 NOTE — ED Provider Notes (Signed)
CSN: [id number]     Arrival date & time 01/13/14  1652 History  This chart was scribed for non-physician practitioner Montine Circle, PA-C working with Ezequiel Essex, MD by Rolanda Lundborg, ED Scribe. This patient was seen in room TR07C/TR07C and the patient's care was started at 5:06 PM.    Chief Complaint  Patient presents with  . Back Pain   The history is provided by the patient. No language interpreter was used.   HPI Comments: Crystal [name] is a 53 y.o. female with a h/o HTN and DM who presents to the Emergency Department with the Chief Complaint of intermittent, gradual-onset, moderate back pain onset 5 days ago with associated dysuria. She denies a h/o back pain. She denies injury. She states Tylenol and Advil ease the pain but the pain comes back when the medicine wears off. She states it is difficult to stand up from a sitting position. Moving and bending make the pain worse. She denies trouble ambulating. She denies numbness. She denies urinary or bowel incontinence.    Past Medical History  Diagnosis Date  . Hypertension   . Diabetes mellitus    History reviewed. No pertinent past surgical history. History reviewed. No pertinent family history. History  Substance Use Topics  . Smoking status: Never Smoker   . Smokeless tobacco: Not on file  . Alcohol Use: No   OB History   Grav Para Term Preterm Abortions TAB SAB Ect Mult Living                 Review of Systems  Constitutional: Negative for fever.  Gastrointestinal: Negative for vomiting.  Genitourinary: Positive for dysuria.  Musculoskeletal: Positive for back pain. Negative for gait problem.  Skin: Negative for rash.  Neurological: Negative for numbness.      Allergies  Aspirin  Home Medications   Current Outpatient Rx  Name  Route  Sig  Dispense  Refill  . acetaminophen (TYLENOL) 500 MG tablet   Oral   Take 1,000 mg by mouth every 6 (six) hours as needed for pain.         [name] amLODipine (NORVASC) 10  MG tablet   Oral   Take 10 mg by mouth daily.         [name] glipiZIDE (GLUCOTROL) 10 MG tablet   Oral   Take 10 mg by mouth 2 (two) times daily before a meal.          . metFORMIN (GLUCOPHAGE) 500 MG tablet   Oral   Take 500 mg by mouth 2 (two) times daily with a meal.         . Multiple Vitamin (MULITIVITAMIN WITH MINERALS) TABS   Oral   Take 1 tablet by mouth daily.          BP 154/72  Pulse 79  Temp(Src) 98.5 F (36.9 C) (Oral)  Resp 22  Ht 5\' 6"  (1.676 m)  Wt 172 lb 1.6 oz (78.064 kg)  BMI 27.79 kg/m2  SpO2 99%  LMP 06/02/2013 Physical Exam  Nursing note and vitals reviewed. Constitutional: She is oriented to person, place, and time. She appears well-developed and well-nourished. No distress.  HENT:  Head: Normocephalic and atraumatic.  Eyes: Conjunctivae and EOM are normal. Right eye exhibits no discharge. Left eye exhibits no discharge. No scleral icterus.  Neck: Normal range of motion. Neck supple. No tracheal deviation present.  Cardiovascular: Normal rate, regular rhythm and normal heart sounds.  Exam reveals no gallop and no friction rub.  No murmur heard. Pulmonary/Chest: Effort normal and breath sounds normal. No respiratory distress. She has no wheezes.  Abdominal: Soft. She exhibits no distension. There is no tenderness.  Musculoskeletal: Normal range of motion.  Lumbar paraspinal muscles tender to palpation, no bony tenderness, step-offs, or gross abnormality or deformity of spine, patient is able to ambulate, moves all extremities  Bilateral great toe extension intact Bilateral plantar/dorsiflexion intact  Neurological: She is alert and oriented to person, place, and time. She has normal reflexes.  Sensation and strength intact bilaterally Symmetrical reflexes  Skin: Skin is warm. She is not diaphoretic.  Psychiatric: She has a normal mood and affect. Her behavior is normal. Judgment and thought content normal.    ED Course  Procedures  (including critical care time) Medications - No data to display  DIAGNOSTIC STUDIES: Oxygen Saturation is 99% on RA, normal by my interpretation.    COORDINATION OF CARE: 5:35 PM- Discussed treatment plan with pt which includes pain medication. Advised pt to follow up with PCP if she is still not better in one week. For the dysuria, will wait on UA and re-eval. Pt agrees to plan.    Labs Review Labs Reviewed  URINALYSIS, ROUTINE W REFLEX MICROSCOPIC - Abnormal; Notable for the following:    Glucose, UA 250 (*)    All other components within normal limits   Imaging Review No results found.   EKG Interpretation None      MDM   Final diagnoses:  Back pain    Patient with back pain.  No neurological deficits and normal neuro exam.  Patient is ambulatory.  No loss of bowel or bladder control.  Doubt cauda equina.  Denies fever,  doubt epidural abscess or other lesion. Recommend back exercises, stretching, RICE, and will treat with a short course of ultram.  Encouraged the patient that there could be a need for additional workup and/or imaging such as MRI, if the symptoms do not resolve. Patient advised that if the back pain does not resolve, or radiates, this could progress to more serious conditions and is encouraged to follow-up with PCP or orthopedics within 2 weeks.     I personally performed the services described in this documentation, which was scribed in my presence. The recorded information has been reviewed and is accurate.    Montine Circle, PA-C 01/13/14 1749

## 2014-01-13 NOTE — ED Notes (Addendum)
Shes had intermittent back pain and urinary discomfort x 5 days. Denies any injuries or history of back pain. Ambulatory, mae.

## 2014-01-13 NOTE — Discharge Instructions (Signed)
Back Pain, Adult Low back pain is very common. About 1 in 5 people have back pain.The cause of low back pain is rarely dangerous. The pain often gets better over time.About half of people with a sudden onset of back pain feel better in just 2 weeks. About 8 in 10 people feel better by 6 weeks.  CAUSES Some common causes of back pain include:  Strain of the muscles or ligaments supporting the spine.  Wear and tear (degeneration) of the spinal discs.  Arthritis.  Direct injury to the back. DIAGNOSIS Most of the time, the direct cause of low back pain is not known.However, back pain can be treated effectively even when the exact cause of the pain is unknown.Answering your caregiver's questions about your overall health and symptoms is one of the most accurate ways to make sure the cause of your pain is not dangerous. If your caregiver needs more information, he or she may order lab work or imaging tests (X-rays or MRIs).However, even if imaging tests show changes in your back, this usually does not require surgery. HOME CARE INSTRUCTIONS For many people, back pain returns.Since low back pain is rarely dangerous, it is often a condition that people can learn to manageon their own.   Remain active. It is stressful on the back to sit or stand in one place. Do not sit, drive, or stand in one place for more than 30 minutes at a time. Take short walks on level surfaces as soon as pain allows.Try to increase the length of time you walk each day.  Do not stay in bed.Resting more than 1 or 2 days can delay your recovery.  Do not avoid exercise or work.Your body is made to move.It is not dangerous to be active, even though your back may hurt.Your back will likely heal faster if you return to being active before your pain is gone.  Pay attention to your body when you bend and lift. Many people have less discomfortwhen lifting if they bend their knees, keep the load close to their bodies,and  avoid twisting. Often, the most comfortable positions are those that put less stress on your recovering back.  Find a comfortable position to sleep. Use a firm mattress and lie on your side with your knees slightly bent. If you lie on your back, put a pillow under your knees.  Only take over-the-counter or prescription medicines as directed by your caregiver. Over-the-counter medicines to reduce pain and inflammation are often the most helpful.Your caregiver may prescribe muscle relaxant drugs.These medicines help dull your pain so you can more quickly return to your normal activities and healthy exercise.  Put ice on the injured area.  Put ice in a plastic bag.  Place a towel between your skin and the bag.  Leave the ice on for 15-20 minutes, 03-04 times a day for the first 2 to 3 days. After that, ice and heat may be alternated to reduce pain and spasms.  Ask your caregiver about trying back exercises and gentle massage. This may be of some benefit.  Avoid feeling anxious or stressed.Stress increases muscle tension and can worsen back pain.It is important to recognize when you are anxious or stressed and learn ways to manage it.Exercise is a great option. SEEK MEDICAL CARE IF:  You have pain that is not relieved with rest or medicine.  You have pain that does not improve in 1 week.  You have new symptoms.  You are generally not feeling well. SEEK   IMMEDIATE MEDICAL CARE IF:   You have pain that radiates from your back into your legs.  You develop new bowel or bladder control problems.  You have unusual weakness or numbness in your arms or legs.  You develop nausea or vomiting.  You develop abdominal pain.  You feel faint. Document Released: 09/23/2005 Document Revised: 03/24/2012 Document Reviewed: 02/11/2011 [employer] Patient Information 2014 [employer].  

## 2014-01-13 NOTE — ED Provider Notes (Signed)
Medical screening examination/treatment/procedure(s) were performed by non-physician practitioner and as supervising physician I was immediately available for consultation/collaboration.   EKG Interpretation None        Ezequiel Essex, MD 01/13/14 2355

## 2014-01-26 ENCOUNTER — Emergency Department (HOSPITAL_COMMUNITY)
Admission: EM | Admit: 2014-01-26 | Discharge: 2014-01-26 | Disposition: A | Discharge status: Home / Self Care | Payer: Medicaid Other | Attending: Emergency Medicine | Admitting: Emergency Medicine

## 2014-01-26 ENCOUNTER — Encounter (HOSPITAL_COMMUNITY): Payer: Self-pay | Admitting: Emergency Medicine

## 2014-01-26 DIAGNOSIS — Z79899 Other long term (current) drug therapy: Secondary | ICD-10-CM | POA: Insufficient documentation

## 2014-01-26 DIAGNOSIS — E119 Type 2 diabetes mellitus without complications: Secondary | ICD-10-CM | POA: Insufficient documentation

## 2014-01-26 DIAGNOSIS — M549 Dorsalgia, unspecified: Secondary | ICD-10-CM | POA: Insufficient documentation

## 2014-01-26 DIAGNOSIS — I10 Essential (primary) hypertension: Secondary | ICD-10-CM | POA: Insufficient documentation

## 2014-01-26 LAB — URINALYSIS, ROUTINE W REFLEX MICROSCOPIC
Bilirubin Urine: NEGATIVE
Glucose, UA: >1000 mg/dL — AB
Hgb urine dipstick: NEGATIVE
Ketones, ur: NEGATIVE mg/dL
Leukocytes, UA: NEGATIVE
Nitrite: NEGATIVE
Protein, ur: NEGATIVE mg/dL
Specific Gravity, Urine: 1.031 — ABNORMAL HIGH (ref 1.005–1.030)
Urobilinogen, UA: 0.2 mg/dL (ref 0.0–1.0)
pH: 7 (ref 5.0–8.0)

## 2014-01-26 LAB — URINE MICROSCOPIC-ADD ON

## 2014-01-26 MED ORDER — CYCLOBENZAPRINE HCL 10 MG PO TABS: 10.0000 mg | ORAL_TABLET | Freq: Two times a day (BID) | ORAL | Status: DC | PRN

## 2014-01-26 NOTE — ED Provider Notes (Signed)
Medical screening examination/treatment/procedure(s) were performed by non-physician practitioner and as supervising physician I was immediately available for consultation/collaboration.   EKG Interpretation None        Alfonzo Feller, DO 01/26/14 1924

## 2014-01-26 NOTE — Discharge Instructions (Signed)
Take Flexeril as needed for muscle pain. Apply heat to the affected area. Follow up with your doctor for further evaluation.

## 2014-01-26 NOTE — ED Notes (Signed)
She states she was seen here for back pain recently. She was prescribed tramadol which she has tried to take without relief of the pain. ambulatory

## 2014-01-26 NOTE — ED Provider Notes (Signed)
CSN: [id number]     Arrival date & time 01/26/14  1000 History  This chart was scribed for non-physician practitioner working with Alvina Chou, by Allena Earing ED Scribe. This patient was seen in TR08C/TR08C and the patient's care was started at 10:45 AM.    Chief Complaint  Patient presents with  . Back Pain      Patient is a 53 y.o. female presenting with back pain. The history is provided by the patient. No language interpreter was used.  Back Pain   HPI Comments: Crystal [name] is a 53 y.o. female who presents to the Emergency Department complaining of intermittent, achy back pain that began 2 weeks ago. Pt states that the pain radiates into her flanks.  She states that it is tender to touch and hurts with movement. Pt is worried that it might be a kidney problem.    Pt states that she needs a CT scan   Past Medical History  Diagnosis Date  . Hypertension   . Diabetes mellitus    History reviewed. No pertinent past surgical history. History reviewed. No pertinent family history. History  Substance Use Topics  . Smoking status: Never Smoker   . Smokeless tobacco: Not on file  . Alcohol Use: No   OB History   Grav Para Term Preterm Abortions TAB SAB Ect Mult Living                 Review of Systems  Constitutional: Positive for activity change.  Musculoskeletal: Positive for back pain. Negative for gait problem.  Psychiatric/Behavioral: Negative for confusion.  All other systems reviewed and are negative.     Allergies  Aspirin  Home Medications   Prior to Admission medications   Medication Sig Start Date End Date Taking? Authorizing Provider  acetaminophen (TYLENOL) 500 MG tablet Take 1,000 mg by mouth every 6 (six) hours as needed for pain.    Historical Provider, MD  amLODipine (NORVASC) 10 MG tablet Take 10 mg by mouth daily.    Historical Provider, MD  glipiZIDE (GLUCOTROL) 10 MG tablet Take 10 mg by mouth 2 (two) times daily before a meal.      Historical Provider, MD  metFORMIN (GLUCOPHAGE) 500 MG tablet Take 500 mg by mouth 2 (two) times daily with a meal.    Historical Provider, MD  Multiple Vitamin (MULITIVITAMIN WITH MINERALS) TABS Take 1 tablet by mouth daily.    Historical Provider, MD  traMADol (ULTRAM) 50 MG tablet Take 1 tablet (50 mg total) by mouth every 6 (six) hours as needed. 01/13/14   Montine Circle, PA-C   BP 149/79  Pulse 84  Temp(Src) 98.3 F (36.8 C) (Oral)  Resp 18  Wt 173 lb 8 oz (78.699 kg)  SpO2 99%  LMP 06/02/2013 Physical Exam  Nursing note and vitals reviewed. Constitutional: She is oriented to person, place, and time. She appears well-developed and well-nourished.  HENT:  Head: Normocephalic and atraumatic.  Eyes: Pupils are equal, round, and reactive to light.  Neck: Normal range of motion.  Pulmonary/Chest: Effort normal.  Musculoskeletal: Normal range of motion.  Bilateral paraspinal tenderness to palpation No midline tenderness   Neurological: She is alert and oriented to person, place, and time.  Pt ambulatory    ED Course  Procedures (including critical care time)  DIAGNOSTIC STUDIES: Oxygen Saturation is 99% on RA, normal by my interpretation.    COORDINATION OF CARE:   10:51 AM-Discussed treatment plan which includes UA with pt at bedside and  pt agreed to plan.   Labs Review Labs Reviewed  URINALYSIS, ROUTINE W REFLEX MICROSCOPIC - Abnormal; Notable for the following:    Specific Gravity, Urine 1.031 (*)    Glucose, UA >1000 (*)    All other components within normal limits  URINE MICROSCOPIC-ADD ON - Abnormal; Notable for the following:    Squamous Epithelial / LPF MANY (*)    All other components within normal limits    Imaging Review No results found.   EKG Interpretation None      MDM   Final diagnoses:  Back pain   I personally performed the services described in this documentation, which was scribed in my presence. The recorded information has been  reviewed and is accurate.   12:23 PM Patient's urine has glucose without other acute changes. Patient's back pain likely muscle strain. Patient will have Flexeril and recommended follow up with her PCP. No saddle paresthesias or bladder/bowel incontinence. Patient requesting a CT of her back which is not necessary at this time.     Alvina Chou, PA-C 01/26/14 1227

## 2014-09-12 ENCOUNTER — Ambulatory Visit: Payer: Medicaid Other | Attending: Family Medicine

## 2014-09-21 ENCOUNTER — Ambulatory Visit: Payer: Medicaid Other | Admitting: Internal Medicine

## 2014-11-14 ENCOUNTER — Other Ambulatory Visit: Payer: Self-pay | Admitting: Oncology

## 2014-11-14 DIAGNOSIS — E049 Nontoxic goiter, unspecified: Secondary | ICD-10-CM

## 2014-11-24 ENCOUNTER — Other Ambulatory Visit: Payer: Self-pay | Admitting: Oncology

## 2014-11-24 DIAGNOSIS — E049 Nontoxic goiter, unspecified: Secondary | ICD-10-CM

## 2014-11-25 ENCOUNTER — Ambulatory Visit
Admission: RE | Admit: 2014-11-25 | Discharge: 2014-11-25 | Disposition: A | Discharge status: Home / Self Care | Payer: No Typology Code available for payment source | Source: Ambulatory Visit | Attending: Oncology | Admitting: Oncology

## 2014-11-25 DIAGNOSIS — E049 Nontoxic goiter, unspecified: Secondary | ICD-10-CM

## 2015-01-08 ENCOUNTER — Emergency Department (HOSPITAL_COMMUNITY): Payer: Self-pay

## 2015-01-08 ENCOUNTER — Encounter (HOSPITAL_COMMUNITY): Payer: Self-pay | Admitting: *Deleted

## 2015-01-08 ENCOUNTER — Emergency Department (HOSPITAL_COMMUNITY)
Admission: EM | Admit: 2015-01-08 | Discharge: 2015-01-08 | Disposition: A | Discharge status: Home / Self Care | Payer: Self-pay | Attending: Emergency Medicine | Admitting: Emergency Medicine

## 2015-01-08 DIAGNOSIS — Z79899 Other long term (current) drug therapy: Secondary | ICD-10-CM | POA: Insufficient documentation

## 2015-01-08 DIAGNOSIS — I10 Essential (primary) hypertension: Secondary | ICD-10-CM | POA: Insufficient documentation

## 2015-01-08 DIAGNOSIS — K5732 Diverticulitis of large intestine without perforation or abscess without bleeding: Secondary | ICD-10-CM | POA: Insufficient documentation

## 2015-01-08 DIAGNOSIS — E119 Type 2 diabetes mellitus without complications: Secondary | ICD-10-CM | POA: Insufficient documentation

## 2015-01-08 LAB — CBC WITH DIFFERENTIAL/PLATELET
Basophils Absolute: 0 10*3/uL (ref 0.0–0.1)
Basophils Relative: 0 % (ref 0–1)
Eosinophils Absolute: 0.1 10*3/uL (ref 0.0–0.7)
Eosinophils Relative: 1 % (ref 0–5)
HCT: 41.6 % (ref 36.0–46.0)
Hemoglobin: 13.1 g/dL (ref 12.0–15.0)
Lymphocytes Relative: 25 % (ref 12–46)
Lymphs Abs: 2 10*3/uL (ref 0.7–4.0)
MCH: 24.1 pg — ABNORMAL LOW (ref 26.0–34.0)
MCHC: 31.5 g/dL (ref 30.0–36.0)
MCV: 76.6 fL — ABNORMAL LOW (ref 78.0–100.0)
Monocytes Absolute: 0.6 10*3/uL (ref 0.1–1.0)
Monocytes Relative: 7 % (ref 3–12)
Neutro Abs: 5.4 10*3/uL (ref 1.7–7.7)
Neutrophils Relative %: 67 % (ref 43–77)
Platelets: 323 10*3/uL (ref 150–400)
RBC: 5.43 MIL/uL — ABNORMAL HIGH (ref 3.87–5.11)
RDW: 14.2 % (ref 11.5–15.5)
WBC: 8 10*3/uL (ref 4.0–10.5)

## 2015-01-08 LAB — BASIC METABOLIC PANEL
Anion gap: 8 (ref 5–15)
BUN: 8 mg/dL (ref 6–23)
CO2: 26 mmol/L (ref 19–32)
Calcium: 8.9 mg/dL (ref 8.4–10.5)
Chloride: 102 mmol/L (ref 96–112)
Creatinine, Ser: 0.47 mg/dL — ABNORMAL LOW (ref 0.50–1.10)
GFR calc Af Amer: >90 mL/min (ref >90)
GFR calc non Af Amer: >90 mL/min (ref >90)
Glucose, Bld: 293 mg/dL — ABNORMAL HIGH (ref 70–99)
Potassium: 4 mmol/L (ref 3.5–5.1)
Sodium: 136 mmol/L (ref 135–145)

## 2015-01-08 LAB — URINALYSIS, ROUTINE W REFLEX MICROSCOPIC
Bilirubin Urine: NEGATIVE
Glucose, UA: >1000 mg/dL — AB
Hgb urine dipstick: NEGATIVE
Ketones, ur: NEGATIVE mg/dL
Leukocytes, UA: NEGATIVE
Nitrite: NEGATIVE
Protein, ur: NEGATIVE mg/dL
Specific Gravity, Urine: 1.011 (ref 1.005–1.030)
Urobilinogen, UA: 0.2 mg/dL (ref 0.0–1.0)
pH: 6 (ref 5.0–8.0)

## 2015-01-08 LAB — URINE MICROSCOPIC-ADD ON

## 2015-01-08 MED ORDER — IOHEXOL 300 MG/ML  SOLN
100.0000 mL | Freq: Once | INTRAMUSCULAR | Status: AC | PRN
  Administered 2015-01-08: 100 mL via INTRAVENOUS

## 2015-01-08 MED ORDER — POLYETHYLENE GLYCOL 3350 17 G PO PACK: 17.0000 g | PACK | Freq: Every day | ORAL | Status: DC

## 2015-01-08 MED ORDER — CIPROFLOXACIN HCL 500 MG PO TABS: 500.0000 mg | ORAL_TABLET | Freq: Two times a day (BID) | ORAL | Status: DC

## 2015-01-08 MED ORDER — IOHEXOL 300 MG/ML  SOLN
25.0000 mL | Freq: Once | INTRAMUSCULAR | Status: AC | PRN
  Administered 2015-01-08: 25 mL via ORAL

## 2015-01-08 MED ORDER — METRONIDAZOLE 500 MG PO TABS: 500.0000 mg | ORAL_TABLET | Freq: Three times a day (TID) | ORAL | Status: DC

## 2015-01-08 MED ORDER — FENTANYL CITRATE 0.05 MG/ML IJ SOLN
50.0000 ug | Freq: Once | INTRAMUSCULAR | Status: AC
  Administered 2015-01-08: 50 ug via INTRAVENOUS
  Filled 2015-01-08: qty 2

## 2015-01-08 MED ORDER — OXYCODONE-ACETAMINOPHEN 5-325 MG PO TABS: 1.0000 | ORAL_TABLET | Freq: Four times a day (QID) | ORAL | Status: DC | PRN

## 2015-01-08 MED ORDER — ONDANSETRON HCL 4 MG/2ML IJ SOLN
4.0000 mg | Freq: Once | INTRAMUSCULAR | Status: AC
  Administered 2015-01-08: 4 mg via INTRAVENOUS
  Filled 2015-01-08: qty 2

## 2015-01-08 MED ORDER — SODIUM CHLORIDE 0.9 % IV BOLUS (SEPSIS)
1000.0000 mL | Freq: Once | INTRAVENOUS | Status: AC
  Administered 2015-01-08: 1000 mL via INTRAVENOUS

## 2015-01-08 NOTE — ED Notes (Signed)
Pt states she has left flank pain that radiates to her left abdomin and left back.  Pt states she has Hx of same symptoms in 2011.  Pt denies v/n/d.  Pt states she took duculax this AM and had a bowl mvmt.  10/10 reported pain.

## 2015-01-08 NOTE — ED Notes (Signed)
Pt ambulated to the bathroom.  

## 2015-01-08 NOTE — Discharge Instructions (Signed)

## 2015-01-08 NOTE — ED Notes (Signed)
Pt finished drinking contrast, Ct called

## 2015-01-08 NOTE — ED Notes (Signed)
Patient transported to CT 

## 2015-01-08 NOTE — ED Notes (Signed)
Pt ambulated to restroom. 

## 2015-01-08 NOTE — ED Provider Notes (Signed)
CSN: [id number]     Arrival date & time 01/08/15  0962 History   First MD Initiated Contact with Patient 01/08/15 [phone]     Chief Complaint  Patient presents with  . Flank Pain     (Consider location/radiation/quality/duration/timing/severity/associated sxs/prior Treatment) The history is provided by the patient.   patient presents with left lower abdominal pain. History of diverticulitis. States for the last 3 days she's been feeling bad. The pain is dull. Worse with movement. No change in her bowel habits. Decreased appetite. Decreased oral intake. States she was unable to take diabetes medicine this morning because of her abdominal pain and decreased appetite. Denies fevers or chills.  Past Medical History  Diagnosis Date  . Hypertension   . Diabetes mellitus    History reviewed. No pertinent past surgical history. History reviewed. No pertinent family history. History  Substance Use Topics  . Smoking status: Never Smoker   . Smokeless tobacco: Not on file  . Alcohol Use: No   OB History    No data available     Review of Systems  Constitutional: Positive for appetite change. Negative for activity change.  Eyes: Negative for pain.  Respiratory: Negative for chest tightness and shortness of breath.   Cardiovascular: Negative for chest pain and leg swelling.  Gastrointestinal: Positive for abdominal pain. Negative for nausea, vomiting and diarrhea.  Genitourinary: Negative for flank pain.  Musculoskeletal: Negative for back pain and neck stiffness.  Skin: Negative for rash.  Neurological: Negative for weakness, numbness and headaches.  Psychiatric/Behavioral: Negative for behavioral problems.      Allergies  Aspirin  Home Medications   Prior to Admission medications   Medication Sig Start Date End Date Taking? Authorizing Provider  acetaminophen (TYLENOL) 500 MG tablet Take 1,000 mg by mouth every 6 (six) hours as needed for pain.   Yes Historical Provider, MD   amLODipine (NORVASC) 10 MG tablet Take 10 mg by mouth daily.   Yes Historical Provider, MD  glipiZIDE (GLUCOTROL) 10 MG tablet Take 10 mg by mouth 2 (two) times daily before a meal.    Yes Historical Provider, MD  metFORMIN (GLUCOPHAGE) 500 MG tablet Take 500 mg by mouth 2 (two) times daily with a meal.   Yes Historical Provider, MD  ciprofloxacin (CIPRO) 500 MG tablet Take 1 tablet (500 mg total) by mouth 2 (two) times daily. 01/08/15   Davonna Belling, MD  metroNIDAZOLE (FLAGYL) 500 MG tablet Take 1 tablet (500 mg total) by mouth 3 (three) times daily. 01/08/15   Davonna Belling, MD  oxyCODONE-acetaminophen (PERCOCET/ROXICET) 5-325 MG per tablet Take 1-2 tablets by mouth every 6 (six) hours as needed for severe pain. 01/08/15   Davonna Belling, MD  polyethylene glycol [employer] / GLYCOLAX) packet Take 17 g by mouth daily. 01/08/15   Davonna Belling, MD  traMADol (ULTRAM) 50 MG tablet Take 1 tablet (50 mg total) by mouth every 6 (six) hours as needed. 01/13/14   Montine Circle, PA-C   BP 111/60 mmHg  Pulse 66  Temp(Src) 98.6 F (37 C) (Oral)  Resp 18  Ht 5\' 1"  (1.549 m)  Wt 171 lb (77.565 kg)  BMI 32.33 kg/m2  SpO2 99%  LMP 06/02/2013 Physical Exam  Constitutional: She is oriented to person, place, and time. She appears well-developed and well-nourished.  HENT:  Head: Normocephalic and atraumatic.  Eyes: EOM are normal. Pupils are equal, round, and reactive to light.  Neck: Normal range of motion. Neck supple.  Cardiovascular: Normal rate, regular rhythm  and normal heart sounds.   No murmur heard. Pulmonary/Chest: Effort normal and breath sounds normal. No respiratory distress. She has no wheezes. She has no rales.  Abdominal: Soft. She exhibits distension. There is no tenderness. There is no rebound and no guarding.  Moderate tenderness to left lower quadrant. No hernias.  Musculoskeletal: Normal range of motion.  Neurological: She is alert and oriented to person, place, and time. No  cranial nerve deficit.  Skin: Skin is warm and dry.  Psychiatric: She has a normal mood and affect. Her speech is normal.  Nursing note and vitals reviewed.   ED Course  Procedures (including critical care time) Labs Review Labs Reviewed  CBC WITH DIFFERENTIAL/PLATELET - Abnormal; Notable for the following:    RBC 5.43 (*)    MCV 76.6 (*)    MCH 24.1 (*)    All other components within normal limits  BASIC METABOLIC PANEL - Abnormal; Notable for the following:    Glucose, Bld 293 (*)    Creatinine, Ser 0.47 (*)    All other components within normal limits  URINALYSIS, ROUTINE W REFLEX MICROSCOPIC - Abnormal; Notable for the following:    Glucose, UA >1000 (*)    All other components within normal limits  URINE MICROSCOPIC-ADD ON    Imaging Review No results found.   EKG Interpretation None      MDM   Final diagnoses:  Diverticulitis of large intestine without perforation or abscess without bleeding    Patient with diverticulitis. History of same. No abscesses on CT. Patient has tolerated orals and feels somewhat better. Will discharge home with antibiotics and pain medicine.    Davonna Belling, MD 01/11/15 2228

## 2015-01-09 ENCOUNTER — Other Ambulatory Visit: Payer: Self-pay | Admitting: Oncology

## 2015-01-26 ENCOUNTER — Other Ambulatory Visit: Payer: Self-pay

## 2015-01-26 DIAGNOSIS — Z1231 Encounter for screening mammogram for malignant neoplasm of breast: Secondary | ICD-10-CM

## 2015-02-22 ENCOUNTER — Ambulatory Visit: Payer: Self-pay

## 2016-08-16 ENCOUNTER — Ambulatory Visit: Payer: Self-pay | Attending: Internal Medicine

## 2016-08-22 ENCOUNTER — Ambulatory Visit (INDEPENDENT_AMBULATORY_CARE_PROVIDER_SITE_OTHER): Payer: Self-pay

## 2016-08-22 ENCOUNTER — Ambulatory Visit (INDEPENDENT_AMBULATORY_CARE_PROVIDER_SITE_OTHER): Payer: Self-pay | Admitting: Sports Medicine

## 2016-08-22 ENCOUNTER — Encounter (INDEPENDENT_AMBULATORY_CARE_PROVIDER_SITE_OTHER): Payer: Self-pay | Admitting: Sports Medicine

## 2016-08-22 VITALS — BP 134/79 | HR 82 | Ht 61.0 in | Wt 175.0 lb

## 2016-08-22 DIAGNOSIS — G8929 Other chronic pain: Secondary | ICD-10-CM

## 2016-08-22 DIAGNOSIS — M545 Low back pain, unspecified: Secondary | ICD-10-CM

## 2016-08-22 DIAGNOSIS — M542 Cervicalgia: Secondary | ICD-10-CM

## 2016-08-22 DIAGNOSIS — M4722 Other spondylosis with radiculopathy, cervical region: Secondary | ICD-10-CM

## 2016-08-22 DIAGNOSIS — N63 Unspecified lump in unspecified breast: Secondary | ICD-10-CM

## 2016-08-22 NOTE — Progress Notes (Signed)
Crystal [name] - 55 y.o. female MRN [id number]  Date of birth: January 07, 1961  Office Visit Note: Visit Date: 08/22/2016 PCP: Barbette Merino, MD Referred by: Elwyn Reach, MD  Subjective: Chief Complaint  Patient presents with  . Neck - New Patient (Initial Visit)  . Middle Back - New Patient (Initial Visit)  . Shoulder Pain    Patient states back pain and neck pain, radiating into right shoulder and right arm.  Having numbness and tingling.    Has had pain for about 3 years, but has gotten worse.  Hurts sometimes to raise right arm above her head if she hasn't taken any medicine.     HPI: Patient presents with the above complaints been chronic in nature. She is previously been seen by Dr. Louanne Skye in multilevel cervical intervention had been discussed but she wishes to defer at that time. She's had worsening right arm symptoms radiating down into the C7 distribution. She's noted a small amount of weakness. Prior EMGs were reassuring for no significant radiculopathy but prior MRI showed significant multilevel degenerative changes. In the interim she has had an evaluation in the emergency department for acute diverticulitis in that CT scan at that time did show a left breast mass that has not been further evaluated. She has not had a follow-up mammogram. She denies any fevers, chills recently gain or weight loss. No significant nighttime disturbances due to this. She is not interested in taking any type of medication for this. ROS: Otherwise per HPI.   Clinical History: No specialty comments available.  She reports that she has never smoked. She does not have any smokeless tobacco history on file.  No results for input(s): HGBA1C, LABURIC in the last 8760 hours.  Assessment & Plan: Visit Diagnoses:  1. Neck pain   2. Chronic bilateral low back pain without sciatica   3. Breast mass in female   4. Osteoarthritis of spine with radiculopathy, cervical region    Plan: We'll go ahead & set her up  for an MRI today of her cervical spine. Referral for mammogram also placed follow-up prior left breast mass. I will have her come back to review the MRI with Dr. Louanne Skye as she is interested in surgical intervention this and it has been offered in the past. Follow-up: Return in about 3 weeks (around 09/12/2016) for with Dr. Louanne Skye to discuss surgical intervention.  Meds: No orders of the defined types were placed in this encounter.  Procedures: No notes on file   Objective:  VS:  HT:5\' 1"  (154.9 cm)   WT:175 lb (79.4 kg)  BMI:33.1    BP:134/79  HR:82bpm  TEMP: ( )  RESP:  Physical Exam: Adult female. No acute distress. Alert & appropriate. Neck & upper extremities: Overall well aligned. She has pain with brachial plexus squeeze as well as arm squeeze test. She is slightly decreased grip strength on the right. Otherwise upper extremity sensation intact. Negative Hoffman's. Minimal pain with Luan Pulling & Neer's testing. Rotator cuff strength intact. Lower extremities & back: Negative straight leg raise bilaterally. Some pain within the bilateral SI joint region. Lower cherry sensation intact. She is able to walk without difficulty. Imaging: Xr Cervical Spine 2 Or 3 Views  Result Date: 08/22/2016 Findings: Slight reversal of cervical lordosis. Marked multilevel degenerative changes most notably at C6-7 with slight wet anterior wedge deformity of C7. Bridging osteophytes between C4-5, C5/C6, C6/C7. Impression: Multilevel degenerative changes with reversal of cervical lordosis. Slight anterior wedge deformity of C7.  Xr  Lumbar Spine 2-3 Views  Result Date: 08/22/2016 Findings: Overall generally well aligned lumbar spine with a lateral shift of L4 on L5. There are multilevel degenerative changes with osteophytic spurring between L3-4 & L4-5. There is a slight retrolisthesis of L5 on L4. No lytic lesions. Mild degenerative change within the bilateral hip joints. 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    Past Medical/Family/Surgical/Social History: Medications & Allergies reviewed per EMR Patient Active Problem List   Diagnosis Date Noted  . Multiple thyroid nodules 01/27/2013  . DISORDER, THYROID NOS 07/07/2007  . VARICOSE VEIN 07/07/2007  . GERD 07/07/2007   Past Medical History:  Diagnosis Date  . Diabetes mellitus   . Hypertension    No family history on file. No past surgical history on file. Social History   Occupational History  . Not on file.   Social History Main Topics  . Smoking status: Never Smoker  . Smokeless tobacco: Not on file  . Alcohol use No  . Drug use: No  . Sexual activity: Not on file

## 2016-08-28 ENCOUNTER — Encounter (INDEPENDENT_AMBULATORY_CARE_PROVIDER_SITE_OTHER): Payer: Self-pay | Admitting: *Deleted

## 2016-09-03 ENCOUNTER — Other Ambulatory Visit (INDEPENDENT_AMBULATORY_CARE_PROVIDER_SITE_OTHER): Payer: Self-pay | Admitting: Sports Medicine

## 2016-09-03 DIAGNOSIS — Z1231 Encounter for screening mammogram for malignant neoplasm of breast: Secondary | ICD-10-CM

## 2016-09-06 ENCOUNTER — Ambulatory Visit (HOSPITAL_COMMUNITY)
Admission: RE | Admit: 2016-09-06 | Discharge: 2016-09-06 | Disposition: A | Discharge status: Home / Self Care | Payer: Self-pay | Source: Ambulatory Visit | Attending: Sports Medicine | Admitting: Sports Medicine

## 2016-09-06 DIAGNOSIS — E042 Nontoxic multinodular goiter: Secondary | ICD-10-CM | POA: Insufficient documentation

## 2016-09-06 DIAGNOSIS — M542 Cervicalgia: Secondary | ICD-10-CM | POA: Insufficient documentation

## 2016-09-06 DIAGNOSIS — M4722 Other spondylosis with radiculopathy, cervical region: Secondary | ICD-10-CM | POA: Insufficient documentation

## 2016-09-06 DIAGNOSIS — M4802 Spinal stenosis, cervical region: Secondary | ICD-10-CM | POA: Insufficient documentation

## 2016-09-09 ENCOUNTER — Ambulatory Visit (INDEPENDENT_AMBULATORY_CARE_PROVIDER_SITE_OTHER): Payer: Self-pay | Admitting: Family Medicine

## 2016-09-09 ENCOUNTER — Encounter: Payer: Self-pay | Admitting: Family Medicine

## 2016-09-09 VITALS — BP 148/78 | Temp 98.2°F | Resp 16 | Ht 63.0 in | Wt 169.0 lb

## 2016-09-09 DIAGNOSIS — J309 Allergic rhinitis, unspecified: Secondary | ICD-10-CM

## 2016-09-09 DIAGNOSIS — E079 Disorder of thyroid, unspecified: Secondary | ICD-10-CM

## 2016-09-09 DIAGNOSIS — I10 Essential (primary) hypertension: Secondary | ICD-10-CM

## 2016-09-09 DIAGNOSIS — E119 Type 2 diabetes mellitus without complications: Secondary | ICD-10-CM | POA: Insufficient documentation

## 2016-09-09 LAB — CBC WITH DIFFERENTIAL/PLATELET
Basophils Absolute: 0 cells/uL (ref 0–200)
Basophils Relative: 0 %
Eosinophils Absolute: 60 cells/uL (ref 15–500)
Eosinophils Relative: 1 %
HCT: 41.1 % (ref 35.0–45.0)
Hemoglobin: 12.9 g/dL (ref 11.7–15.5)
Lymphocytes Relative: 38 %
Lymphs Abs: 2280 cells/uL (ref 850–3900)
MCH: 24.3 pg — ABNORMAL LOW (ref 27.0–33.0)
MCHC: 31.4 g/dL — ABNORMAL LOW (ref 32.0–36.0)
MCV: 77.4 fL — ABNORMAL LOW (ref 80.0–100.0)
MPV: 8.7 fL (ref 7.5–12.5)
Monocytes Absolute: 420 cells/uL (ref 200–950)
Monocytes Relative: 7 %
Neutro Abs: 3240 cells/uL (ref 1500–7800)
Neutrophils Relative %: 54 %
Platelets: 369 10*3/uL (ref 140–400)
RBC: 5.31 MIL/uL — ABNORMAL HIGH (ref 3.80–5.10)
RDW: 15.5 % — ABNORMAL HIGH (ref 11.0–15.0)
WBC: 6 10*3/uL (ref 3.8–10.8)

## 2016-09-09 LAB — POCT URINALYSIS DIPSTICK
Bilirubin, UA: NEGATIVE
Glucose, UA: NEGATIVE
Ketones, UA: NEGATIVE
Leukocytes, UA: NEGATIVE
Nitrite, UA: NEGATIVE
Protein, UA: NEGATIVE
Spec Grav, UA: 1.01
Urobilinogen, UA: 0.2
pH, UA: 6

## 2016-09-09 LAB — POCT URINALYSIS DIP (DEVICE)
Bilirubin Urine: NEGATIVE
Glucose, UA: NEGATIVE mg/dL
Ketones, ur: NEGATIVE mg/dL
Leukocytes, UA: NEGATIVE
Nitrite: NEGATIVE
Protein, ur: NEGATIVE mg/dL
Specific Gravity, Urine: 1.01 (ref 1.005–1.030)
Urobilinogen, UA: 0.2 mg/dL (ref 0.0–1.0)
pH: 6 (ref 5.0–8.0)

## 2016-09-09 LAB — COMPLETE METABOLIC PANEL WITH GFR
ALT: 26 U/L (ref 6–29)
AST: 18 U/L (ref 10–35)
Albumin: 3.9 g/dL (ref 3.6–5.1)
Alkaline Phosphatase: 99 U/L (ref 33–130)
BUN: 10 mg/dL (ref 7–25)
CO2: 27 mmol/L (ref 20–31)
Calcium: 9.3 mg/dL (ref 8.6–10.4)
Chloride: 104 mmol/L (ref 98–110)
Creat: 0.36 mg/dL — ABNORMAL LOW (ref 0.50–1.05)
GFR, Est African American: >89 mL/min (ref >=60)
GFR, Est Non African American: >89 mL/min (ref >=60)
Glucose, Bld: 176 mg/dL — ABNORMAL HIGH (ref 65–99)
Potassium: 4.4 mmol/L (ref 3.5–5.3)
Sodium: 140 mmol/L (ref 135–146)
Total Bilirubin: 0.6 mg/dL (ref 0.2–1.2)
Total Protein: 6.8 g/dL (ref 6.1–8.1)

## 2016-09-09 LAB — T4, FREE: Free T4: 1.2 ng/dL (ref 0.8–1.8)

## 2016-09-09 LAB — TSH: TSH: 0.01 mIU/L — ABNORMAL LOW

## 2016-09-09 LAB — T3, FREE: T3, Free: 4.2 pg/mL (ref 2.3–4.2)

## 2016-09-09 MED ORDER — LORATADINE 10 MG PO TABS: 10.0000 mg | ORAL_TABLET | Freq: Every day | ORAL | 11 refills | Status: DC

## 2016-09-09 MED ORDER — METFORMIN HCL 1000 MG PO TABS: 1000.0000 mg | ORAL_TABLET | Freq: Two times a day (BID) | ORAL | 1 refills | Status: DC

## 2016-09-09 MED ORDER — TRUE METRIX AIR GLUCOSE METER W/DEVICE KIT: 1.0000 | PACK | Freq: Every morning | 1 refills | Status: DC

## 2016-09-09 MED ORDER — GLIPIZIDE 10 MG PO TABS: 10.0000 mg | ORAL_TABLET | Freq: Two times a day (BID) | ORAL | 1 refills | Status: DC

## 2016-09-09 MED ORDER — AMLODIPINE BESYLATE 10 MG PO TABS: 10.0000 mg | ORAL_TABLET | Freq: Every day | ORAL | 1 refills | Status: DC

## 2016-09-09 MED FILL — TRUE METRIX BLOOD GLUCOSE M: W/DEVICE | 1 days supply | Qty: 1 | Fill #0

## 2016-09-09 MED FILL — glipiZIDE 10 MG TABS: 10 | 30 days supply | Qty: 60 | Fill #0

## 2016-09-09 MED FILL — metFORMIN HCL 1000 MG TABS: 1000 | 30 days supply | Qty: 60 | Fill #0

## 2016-09-09 NOTE — Patient Instructions (Addendum)
Will increase Metformin to 1000 mg with breakfast and 1000 mg with lunch.  Take Glipizide 10 mg twice daily Recommend a lowfat, low carbohydrate diet divided over 5-6 small meals, increase water intake to 6-8 glasses, and 150 minutes per week of cardiovascular exercise.   Sent referral to opthalmology. They will call you to schedule an appointment.    Diabetes Mellitus and Food It is important for you to manage your blood sugar (glucose) level. Your blood glucose level can be greatly affected by what you eat. Eating healthier foods in the appropriate amounts throughout the day at about the same time each day will help you control your blood glucose level. It can also help slow or prevent worsening of your diabetes mellitus. Healthy eating may even help you improve the level of your blood pressure and reach or maintain a healthy weight. General recommendations for healthful eating and cooking habits include:  Eating meals and snacks regularly. Avoid going long periods of time without eating to lose weight.  Eating a diet that consists mainly of plant-based foods, such as fruits, vegetables, nuts, legumes, and whole grains.  Using low-heat cooking methods, such as baking, instead of high-heat cooking methods, such as deep frying. Work with your dietitian to make sure you understand how to use the Nutrition Facts information on food labels. How can food affect me? Carbohydrates  Carbohydrates affect your blood glucose level more than any other type of food. Your dietitian will help you determine how many carbohydrates to eat at each meal and teach you how to count carbohydrates. Counting carbohydrates is important to keep your blood glucose at a healthy level, especially if you are using insulin or taking certain medicines for diabetes mellitus. Alcohol  Alcohol can cause sudden decreases in blood glucose (hypoglycemia), especially if you use insulin or take certain medicines for diabetes mellitus.  Hypoglycemia can be a life-threatening condition. Symptoms of hypoglycemia (sleepiness, dizziness, and disorientation) are similar to symptoms of having too much alcohol. If your health care provider has given you approval to drink alcohol, do so in moderation and use the following guidelines:  Women should not have more than one drink per day, and men should not have more than two drinks per day. One drink is equal to:  12 oz of beer.  5 oz of wine.  1 oz of hard liquor.  Do not drink on an empty stomach.  Keep yourself hydrated. Have water, diet soda, or unsweetened iced tea.  Regular soda, juice, and other mixers might contain a lot of carbohydrates and should be counted. What foods are not recommended? As you make food choices, it is important to remember that all foods are not the same. Some foods have fewer nutrients per serving than other foods, even though they might have the same number of calories or carbohydrates. It is difficult to get your body what it needs when you eat foods with fewer nutrients. Examples of foods that you should avoid that are high in calories and carbohydrates but low in nutrients include:  Trans fats (most processed foods list trans fats on the Nutrition Facts label).  Regular soda.  Juice.  Candy.  Sweets, such as cake, pie, doughnuts, and cookies.  Fried foods. What foods can I eat? Eat nutrient-rich foods, which will nourish your body and keep you healthy. The food you should eat also will depend on several factors, including:  The calories you need.  The medicines you take.  Your weight.  Your blood  glucose level.  Your blood pressure level.  Your cholesterol level. You should eat a variety of foods, including:  Protein.  Lean cuts of meat.  Proteins low in saturated fats, such as fish, egg whites, and beans. Avoid processed meats.  Fruits and vegetables.  Fruits and vegetables that may help control blood glucose levels,  such as apples, mangoes, and yams.  Dairy products.  Choose fat-free or low-fat dairy products, such as milk, yogurt, and cheese.  Grains, bread, pasta, and rice.  Choose whole grain products, such as multigrain bread, whole oats, and brown rice. These foods may help control blood pressure.  Fats.  Foods containing healthful fats, such as nuts, avocado, olive oil, canola oil, and fish. Does everyone with diabetes mellitus have the same meal plan? Because every person with diabetes mellitus is different, there is not one meal plan that works for everyone. It is very important that you meet with a dietitian who will help you create a meal plan that is just right for you. This information is not intended to replace advice given to you by your health care provider. Make sure you discuss any questions you have with your health care provider. Document Released: 06/20/2005 Document Revised: 02/29/2016 Document Reviewed: 08/20/2013 Elsevier Interactive Patient Education  2017 [place].  Diabetes Mellitus and Exercise Exercising regularly is important for your overall health, especially when you have diabetes (diabetes mellitus). Exercising is not only about losing weight. It has many health benefits, such as increasing muscle strength and bone density and reducing body fat and stress. This leads to improved fitness, flexibility, and endurance, all of which result in better overall health. Exercise has additional benefits for people with diabetes, including:  Reducing appetite.  Helping to lower and control blood glucose.  Lowering blood pressure.  Helping to control amounts of fatty substances (lipids) in the blood, such as cholesterol and triglycerides.  Helping the body to respond better to insulin (improving insulin sensitivity).  Reducing how much insulin the body needs.  Decreasing the risk for heart disease by:  Lowering cholesterol and triglyceride levels.  Increasing the  levels of good cholesterol.  Lowering blood glucose levels. What is my activity plan? Your health care provider or certified diabetes educator can help you make a plan for the type and frequency of exercise (activity plan) that works for you. Make sure that you:  Do at least 150 minutes of moderate-intensity or vigorous-intensity exercise each week. This could be brisk walking, biking, or water aerobics.  Do stretching and strength exercises, such as yoga or weightlifting, at least 2 times a week.  Spread out your activity over at least 3 days of the week.  Get some form of physical activity every day.  Do not go more than 2 days in a row without some kind of physical activity.  Avoid being inactive for more than 90 minutes at a time. Take frequent breaks to walk or stretch.  Choose a type of exercise or activity that you enjoy, and set realistic goals.  Start slowly, and gradually increase the intensity of your exercise over time. What do I need to know about managing my diabetes?  Check your blood glucose before and after exercising.  If your blood glucose is higher than 240 mg/dL (13.3 mmol/L) before you exercise, check your urine for ketones. If you have ketones in your urine, do not exercise until your blood glucose returns to normal.  Know the symptoms of low blood glucose (hypoglycemia) and how  to treat it. Your risk for hypoglycemia increases during and after exercise. Common symptoms of hypoglycemia can include:  Hunger.  Anxiety.  Sweating and feeling clammy.  Confusion.  Dizziness or feeling light-headed.  Increased heart rate or palpitations.  Blurry vision.  Tingling or numbness around the mouth, lips, or tongue.  Tremors or shakes.  Irritability.  Keep a rapid-acting carbohydrate snack available before, during, and after exercise to help prevent or treat hypoglycemia.  Avoid injecting insulin into areas of the body that are going to be exercised. For  example, avoid injecting insulin into:  The arms, when playing tennis.  The legs, when jogging.  Keep records of your exercise habits. Doing this can help you and your health care provider adjust your diabetes management plan as needed. Write down:  Food that you eat before and after you exercise.  Blood glucose levels before and after you exercise.  The type and amount of exercise you have done.  When your insulin is expected to peak, if you use insulin. Avoid exercising at times when your insulin is peaking.  When you start a new exercise or activity, work with your health care provider to make sure the activity is safe for you, and to adjust your insulin, medicines, or food intake as needed.  Drink plenty of water while you exercise to prevent dehydration or heat stroke. Drink enough fluid to keep your urine clear or pale yellow. This information is not intended to replace advice given to you by your health care provider. Make sure you discuss any questions you have with your health care provider. Document Released: 12/14/2003 Document Revised: 04/12/2016 Document Reviewed: 03/04/2016 Elsevier Interactive Patient Education  2017 [place].  Diabetes and Foot Care Diabetes may cause you to have problems because of poor blood supply (circulation) to your feet and legs. This may cause the skin on your feet to become thinner, break easier, and heal more slowly. Your skin may become dry, and the skin may peel and crack. You may also have nerve damage in your legs and feet causing decreased feeling in them. You may not notice minor injuries to your feet that could lead to infections or more serious problems. Taking care of your feet is one of the most important things you can do for yourself. Follow these instructions at home:  Wear shoes at all times, even in the house. Do not go barefoot. Bare feet are easily injured.  Check your feet daily for blisters, cuts, and redness. If you  cannot see the bottom of your feet, use a mirror or ask someone for help.  Wash your feet with warm water (do not use hot water) and mild soap. Then pat your feet and the areas between your toes until they are completely dry. Do not soak your feet as this can dry your skin.  Apply a moisturizing lotion or petroleum jelly (that does not contain alcohol and is unscented) to the skin on your feet and to dry, brittle toenails. Do not apply lotion between your toes.  Trim your toenails straight across. Do not dig under them or around the cuticle. File the edges of your nails with an emery board or nail file.  Do not cut corns or calluses or try to remove them with medicine.  Wear clean socks or stockings every day. Make sure they are not too tight. Do not wear knee-high stockings since they may decrease blood flow to your legs.  Wear shoes that fit properly and  have enough cushioning. To break in new shoes, wear them for just a few hours a day. This prevents you from injuring your feet. Always look in your shoes before you put them on to be sure there are no objects inside.  Do not cross your legs. This may decrease the blood flow to your feet.  If you find a minor scrape, cut, or break in the skin on your feet, keep it and the skin around it clean and dry. These areas may be cleansed with mild soap and water. Do not cleanse the area with peroxide, alcohol, or iodine.  When you remove an adhesive bandage, be sure not to damage the skin around it.  If you have a wound, look at it several times a day to make sure it is healing.  Do not use heating pads or hot water bottles. They may burn your skin. If you have lost feeling in your feet or legs, you may not know it is happening until it is too late.  Make sure your health care provider performs a complete foot exam at least annually or more often if you have foot problems. Report any cuts, sores, or bruises to your health care provider  immediately. Contact a health care provider if:  You have an injury that is not healing.  You have cuts or breaks in the skin.  You have an ingrown nail.  You notice redness on your legs or feet.  You feel burning or tingling in your legs or feet.  You have pain or cramps in your legs and feet.  Your legs or feet are numb.  Your feet always feel cold. Get help right away if:  There is increasing redness, swelling, or pain in or around a wound.  There is a red line that goes up your leg.  Pus is coming from a wound.  You develop a fever or as directed by your health care provider.  You notice a bad smell coming from an ulcer or wound. This information is not intended to replace advice given to you by your health care provider. Make sure you discuss any questions you have with your health care provider. Document Released: 09/20/2000 Document Revised: 02/29/2016 Document Reviewed: 03/02/2013 Elsevier Interactive Patient Education  2017 [place]. Insulin Glargine injection What is this medicine? INSULIN GLARGINE (IN su lin GLAR geen) is a human-made form of insulin. This drug lowers the amount of sugar in your blood. It is a long-acting insulin that is usually given once a day. This medicine may be used for other purposes; ask your health care provider or pharmacist if you have questions. COMMON BRAND NAME(S): BASAGLAR, Lantus, Lantus SoloStar, Toujeo SoloStar What should I tell my health care provider before I take this medicine? They need to know if you have any of these conditions: -episodes of hypoglycemia -kidney disease -liver disease -an unusual or allergic reaction to insulin, metacresol, other medicines, foods, dyes, or preservatives -pregnant or trying to get pregnant -breast-feeding How should I use this medicine? This medicine is for injection under the skin. Use this medicine at the same time each day. Use exactly as directed. This insulin should never be  mixed in the same syringe with other insulins before injection. Do not vigorously shake before use. You will be taught how to use this medicine and how to adjust doses for activities and illness. Do not use more insulin than prescribed. Always check the appearance of your insulin before using it. This medicine  should be clear and colorless like water. Do not use it if it is cloudy, thickened, colored, or has solid particles in it. It is important that you put your used needles and syringes in a special sharps container. Do not put them in a trash can. If you do not have a sharps container, call your pharmacist or healthcare provider to get one. Talk to your pediatrician regarding the use of this medicine in children. Special care may be needed. Overdosage: If you think you have taken too much of this medicine contact a poison control center or emergency room at once. NOTE: This medicine is only for you. Do not share this medicine with others. What if I miss a dose? It is important not to miss a dose. Your health care professional or doctor should discuss a plan for missed doses with you. If you do miss a dose, follow their plan. Do not take double doses. What may interact with this medicine? -other medicines for diabetes Many medications may cause an increase or decrease in blood sugar, these include: -alcohol containing beverages -aspirin and aspirin-like drugs -chloramphenicol -chromium -diuretics -female hormones, like estrogens or progestins and birth control pills -heart medicines -isoniazid -female hormones or anabolic steroids -medicines for weight loss -medicines for allergies, asthma, cold, or cough -medicines for mental problems -medicines called MAO Inhibitors like Nardil, Parnate, Marplan, Eldepryl -niacin -NSAIDs, medicines for pain and inflammation, like ibuprofen or naproxen -pentamidine -phenytoin -probenecid -quinolone antibiotics like ciprofloxacin, levofloxacin,  ofloxacin -some herbal dietary supplements -steroid medicines like prednisone or cortisone -thyroid medicine Some medications can hide the warning symptoms of low blood sugar. You may need to monitor your blood sugar more closely if you are taking one of these medications. These include: -beta-blockers such as atenolol, metoprolol, propranolol -clonidine -guanethidine -reserpine This list may not describe all possible interactions. Give your health care provider a list of all the medicines, herbs, non-prescription drugs, or dietary supplements you use. Also tell them if you smoke, drink alcohol, or use illegal drugs. Some items may interact with your medicine. What should I watch for while using this medicine? Visit your health care professional or doctor for regular checks on your progress. Do not drive, use machinery, or do anything that needs mental alertness until you know how this medicine affects you. Alcohol may interfere with the effect of this medicine. Avoid alcoholic drinks. A test called the HbA1C (A1C) will be monitored. This is a simple blood test. It measures your blood sugar control over the last 2 to 3 months. You will receive this test every 3 to 6 months. Learn how to check your blood sugar. Learn the symptoms of low and high blood sugar and how to manage them. Always carry a quick-source of sugar with you in case you have symptoms of low blood sugar. Examples include hard sugar candy or glucose tablets. Make sure others know that you can choke if you eat or drink when you develop serious symptoms of low blood sugar, such as seizures or unconsciousness. They must get medical help at once. Tell your doctor or health care professional if you have high blood sugar. You might need to change the dose of your medicine. If you are sick or exercising more than usual, you might need to change the dose of your medicine. Do not skip meals. Ask your doctor or health care professional if you  should avoid alcohol. Many nonprescription cough and cold products contain sugar or alcohol. These can affect blood sugar.  Make sure that you have the right kind of syringe for the type of insulin you use. Try not to change the brand and type of insulin or syringe unless your health care professional or doctor tells you to. Switching insulin brand or type can cause dangerously high or low blood sugar. Always keep an extra supply of insulin, syringes, and needles on hand. Use a syringe one time only. Throw away syringe and needle in a closed container to prevent accidental needle sticks. Insulin pens and cartridges should never be shared. Even if the needle is changed, sharing may result in passing of viruses like hepatitis or HIV. Wear a medical ID bracelet or chain, and carry a card that describes your disease and details of your medicine and dosage times. What side effects may I notice from receiving this medicine? Side effects that you should report to your doctor or health care professional as soon as possible: -allergic reactions like skin rash, itching or hives, swelling of the face, lips, or tongue -breathing problems -signs and symptoms of high blood sugar such as dizziness, dry mouth, dry skin, fruity breath, nausea, stomach pain, increased hunger or thirst, increased urination -signs and symptoms of low blood sugar such as feeling anxious, confusion, dizziness, increased hunger, unusually weak or tired, sweating, shakiness, cold, irritable, headache, blurred vision, fast heartbeat, loss of consciousness Side effects that usually do not require medical attention (report to your doctor or health care professional if they continue or are bothersome): -increase or decrease in fatty tissue under the skin due to overuse of a particular injection site -itching, burning, swelling, or rash at site where injected This list may not describe all possible side effects. Call your doctor for medical advice  about side effects. You may report side effects to FDA at 1-800-FDA-1088. Where should I keep my medicine? Keep out of the reach of children. Store unopened vials in a refrigerator between 2 and 8 degrees C (36 and 46 degrees F). Do not freeze or use if the insulin has been frozen. Opened vials (vials currently in use) may be stored in the refrigerator or at room temperature, at approximately 25 degrees C (77 degrees F) or cooler. Keeping your insulin at room temperature decreases the amount of pain during injection. Once opened, your insulin can be used for 28 days. After 28 days, the vial should be thrown away. Store Lantus [employer], mining in a refrigerator between 2 and 8 degrees C (36 and 46 degrees F) or at room temperature below 30 degrees C (86 degrees F). Do not freeze or use if the insulin has been frozen. Once opened, the pens should be kept at room temperature. Do not store in the refrigerator once opened. Once opened, the insulin can be used for 28 days. After 28 days, the Lantus Solostar Pen or Basaglar KwikPen should be thrown away. Store [employer] in a refrigerator between 2 and 8 degrees C (36 and 46 degrees F). Do not freeze or use if the insulin has been frozen. Once opened, the pens should be kept at room temperature below 30 degrees C (86 degrees F). Do not store in the refrigerator once opened. Once opened, the insulin can be used for 42 days. After 42 days, the [employer] Pen should be thrown away. Protect all insulin vials and pens from light and excessive heat. Throw away any unused medicine after the expiration date or after the specified time for room temperature storage has passed. NOTE:  This sheet is a summary. It may not cover all possible information. If you have questions about this medicine, talk to your doctor, pharmacist, or health care provider.  2017 Elsevier/[employer] Standard (2015-10-26 10:19:14)

## 2016-09-09 NOTE — Progress Notes (Signed)
Subjective:    Patient ID: Crystal [name], female    DOB: 10-09-1960, 55 y.o.   MRN: [id number]  HPI Crystal [name], a 55 year old female with a history of hypertension and type 2 diabetes mellitus presents accompanied by husband to establish care. Patient has a history of hypertension.  She is not exercising and is not adherent to low salt diet.  She does not check blood pressures at home. She is taking Amlodipine consistently. She last had Amlodipine 30 minutes prior to arrival.  Patient denies chest pain, dyspnea, fatigue, palpitations, syncope and tachypnea.  Cardiovascular risk factors: diabetes mellitus. Crystal [name] also has a history of type 2 diabetes melliutus. She is taking Metformin and Glipizide inconsistently.  Symptoms include: paresthesia of the feet and visual disturbances.Patient denies paresthesia of the feet, polydipsia and polyuria.   Past Medical History:  Diagnosis Date  . Diabetes mellitus   . Hypertension    Social History   Social History  . Marital status: Married    Spouse name: N/A  . Number of children: N/A  . Years of education: N/A   Occupational History  . Not on file.   Social History Main Topics  . Smoking status: Never Smoker  . Smokeless tobacco: Never Used  . Alcohol use No  . Drug use: No  . Sexual activity: Not on file   Other Topics Concern  . Not on file   Social History Narrative  . No narrative on file   Allergies  Allergen Reactions  . Aspirin Nausea And Vomiting   Review of Systems  Constitutional: Negative.  Negative for fatigue and fever.  HENT: Positive for postnasal drip and sneezing.   Eyes: Positive for redness and itching.  Respiratory: Negative.   Cardiovascular: Negative.   Gastrointestinal: Negative.   Endocrine: Negative.  Negative for cold intolerance, heat intolerance, polydipsia, polyphagia and polyuria.  Genitourinary: Negative.   Musculoskeletal: Positive for back pain, myalgias and neck pain.  Skin: Negative.    Allergic/Immunologic: Positive for environmental allergies.  Neurological: Negative.   Hematological: Negative.   Psychiatric/Behavioral: Negative.        Objective:   Physical Exam  Constitutional: She is oriented to person, place, and time. She appears well-developed.  HENT:  Head: Normocephalic and atraumatic.  Right Ear: External ear normal.  Left Ear: External ear normal.  Mouth/Throat: Oropharynx is clear and moist.  Eyes: Conjunctivae and EOM are normal. Pupils are equal, round, and reactive to light.  Neck: Normal range of motion and full passive range of motion without pain. Neck supple. Normal carotid pulses present. No neck rigidity. No edema and normal range of motion present. Thyroid mass and thyromegaly present.    Cardiovascular: Normal rate, regular rhythm, normal heart sounds and intact distal pulses.   Pulmonary/Chest: Effort normal and breath sounds normal.  Abdominal: Soft. Bowel sounds are normal.  Neurological: She is alert and oriented to person, place, and time. She has normal reflexes. No cranial nerve deficit. Coordination and gait normal.  Microfilament examination normal  Skin: Skin is warm and dry.  Psychiatric: She has a normal mood and affect. Her behavior is normal. Judgment and thought content normal.       BP (!) 155/72 (BP Location: Right Arm, Patient Position: Sitting, Cuff Size: Normal)   Temp 98.2 F (36.8 C) (Oral)   Resp 16   Ht '5\' 3"'$  (1.6 m)   Wt 169 lb (76.7 kg)   LMP 06/02/2013   SpO2 99%  BMI 29.94 kg/m  Assessment & Plan:  1. Essential hypertension Will continue Amlodipine 10 mg daily for blood pressure. Will follow up with patient by phone to discuss lab results. The patient is asked to make an attempt to improve diet and exercise patterns to aid in medical management of this problem. - COMPLETE METABOLIC PANEL WITH GFR - CBC with Differential  2. Type 2 diabetes mellitus without complication, without long-term current use  of insulin (HCC) Hemoglobin a1C is 9.3 at present. Crystal [name] is taking anti-diabetic medications inconsistently. Patient counseled at length on the importance of taking medications as directed and following a carbohydrate modified diet. Referred patient to diabetes education. Will increase Metformin to 1000 mg BID and continue Glipizide 10 mg BID. Will follow up in 1 month for fasting laboratory tests.  Diabetic foot examination normal: provided written information on diabetic foot examination.  - COMPLETE METABOLIC PANEL WITH GFR - Urinalysis Dipstick - Microalbumin/Creatinine Ratio, Urine - glipiZIDE (GLUCOTROL) 10 MG tablet; Take 1 tablet (10 mg total) by mouth 2 (two) times daily before a meal.  Dispense: 180 tablet; Refill: 1 - Ambulatory referral to Ophthalmology - metFORMIN (GLUCOPHAGE) 1000 MG tablet; Take 1 tablet (1,000 mg total) by mouth 2 (two) times daily with a meal.  Dispense: 180 tablet; Refill: 1 - Ambulatory referral to diabetic education - Blood Glucose Monitoring Suppl (TRUE METRIX AIR GLUCOSE METER) w/Device KIT; 1 each by Does not apply route every morning.  Dispense: 1 kit; Refill: 1  3. Acute allergic rhinitis, unspecified seasonality, unspecified trigger  - loratadine (CLARITIN) 10 MG tablet; Take 1 tablet (10 mg total) by mouth daily.  Dispense: 30 tablet; Refill: 11  4. Disorder of thyroid Patient has a history of thyroid disorder, was previously on methimazole. Will follow up by phone with laboratory results - TSH - T3, Free - T4, Free   Health Maintenance:  Will follow up on mammogram results Schedule pap smear on next visit Patient refused vaccinations Recommend a lowfat, low carbohydrate diet divided over 5-6 small meals, increase water intake to 6-8 glasses, and 150 minutes per week of cardiovascular exercise.    RTC: 1 month for DMII, HTN, and thyroid disorder   Ary Rudnick M, FNP

## 2016-09-10 ENCOUNTER — Other Ambulatory Visit: Payer: Self-pay | Admitting: Family Medicine

## 2016-09-10 ENCOUNTER — Telehealth: Payer: Self-pay | Admitting: Family Medicine

## 2016-09-10 ENCOUNTER — Other Ambulatory Visit: Payer: Self-pay | Admitting: Pharmacist

## 2016-09-10 ENCOUNTER — Ambulatory Visit
Admission: RE | Admit: 2016-09-10 | Discharge: 2016-09-10 | Disposition: A | Discharge status: Home / Self Care | Payer: No Typology Code available for payment source | Source: Ambulatory Visit | Attending: Sports Medicine | Admitting: Sports Medicine

## 2016-09-10 DIAGNOSIS — Z1231 Encounter for screening mammogram for malignant neoplasm of breast: Secondary | ICD-10-CM

## 2016-09-10 DIAGNOSIS — E079 Disorder of thyroid, unspecified: Secondary | ICD-10-CM

## 2016-09-10 LAB — MICROALBUMIN / CREATININE URINE RATIO
Creatinine, Urine: 32 mg/dL (ref 20–320)
Microalb Creat Ratio: 9 mcg/mg creat (ref <30)
Microalb, Ur: 0.3 mg/dL

## 2016-09-10 MED ORDER — METHIMAZOLE 5 MG PO TABS
5.0000 mg | ORAL_TABLET | Freq: Two times a day (BID) | ORAL | 1 refills | Status: DC
Start: 2016-09-10 — End: 2017-01-24

## 2016-09-10 MED ORDER — GLUCOSE BLOOD VI STRP: ORAL_STRIP | 12 refills | Status: DC

## 2016-09-10 MED ORDER — TRUEPLUS LANCETS 28G MISC: 5 refills | Status: DC

## 2016-09-10 MED FILL — TRUEplus LANCETS 28G MISC: 30 days supply | Qty: 100 | Fill #0

## 2016-09-10 MED FILL — AMLODIPINE BESYLATE 10 MG T: 10 | 30 days supply | Qty: 30 | Fill #0

## 2016-09-10 MED FILL — TRUE METRIX TEST STRIP: 30 days supply | Qty: 100 | Fill #0

## 2016-09-10 MED FILL — methIMAzole 5 MG TABS: 5 | 30 days supply | Qty: 60 | Fill #0

## 2016-09-10 NOTE — Telephone Encounter (Signed)
Will start Methimazole 5 mg BID due to hyperthyroidism. Reviewed previous thyroid ultrasound and biopsy from 2014. Will continue medication.   Meds ordered this encounter  Medications  . methimazole (TAPAZOLE) 5 MG tablet    Sig: Take 1 tablet (5 mg total) by mouth 2 (two) times daily.    Dispense:  60 tablet    Refill:  1    Mckenzie Toruno M, FNP

## 2016-09-10 NOTE — Telephone Encounter (Signed)
I have called and spoken with patient, advised of lab results and the need to re-start thyroid medication twice daily. Patient verbalized understanding and I advised that medication has been sent into pharmacy. She is to return to clinic on 10/14/2016 for follow up. Thanks!

## 2016-09-10 NOTE — Progress Notes (Unsigned)
t

## 2016-09-12 NOTE — Progress Notes (Signed)
Will see if we can schedule for next Thursday while I have a PA in the office then I can spend some time, needs interpretor and post op SNF may not be an option so family discussion.

## 2016-09-12 NOTE — Progress Notes (Signed)
Please overbook this patient for next Thursday AM so can discuss MRI and surgery, may need interpretor and family to discuss. [name]

## 2016-09-13 NOTE — Progress Notes (Signed)
Spoke with pt & sch'd appt for 09/19/16 @ 2:00pm per Nira Conn

## 2016-09-19 ENCOUNTER — Ambulatory Visit (INDEPENDENT_AMBULATORY_CARE_PROVIDER_SITE_OTHER): Payer: Self-pay | Admitting: Specialist

## 2016-09-19 ENCOUNTER — Encounter (INDEPENDENT_AMBULATORY_CARE_PROVIDER_SITE_OTHER): Payer: Self-pay | Admitting: Specialist

## 2016-09-19 VITALS — BP 164/86 | HR 90 | Ht 61.0 in | Wt 175.0 lb

## 2016-09-19 DIAGNOSIS — M4802 Spinal stenosis, cervical region: Secondary | ICD-10-CM

## 2016-09-19 DIAGNOSIS — M5412 Radiculopathy, cervical region: Secondary | ICD-10-CM

## 2016-09-19 NOTE — Patient Instructions (Signed)
Avoid overhead lifting and overhead use of the arms. Do not lift greater than 5 lbs. Adjust head rest in vehicle to prevent hyperextension if rear ended. Take extra precautions to avoid falling, including use of a cane if you feel weak. Scheduling secretary Kandice Hams. will call you to arrange for surgery for your cervical spine. If you wish a second opinion please let us know and we can arrange for you. If you have worsening arm or leg numbness or weakness please call or go to an ER. We will contact your cardiologist and primary care physicians to seek clearance for your surgery. Surgery will be an anterior decompression of the cervical spinal canal at C6-7 and removal of bone pressing on the spinal cord with bone grafting and plate and screws, Local bone graft as well and allograft bone graft and vivigen.  Risks of surgery include risks of infection, bleeding and risks to the spinal cord and  Risks of sore throat and difficulty swallowing which should  Improve over the next 4-6 weeks following surgery. Surgery is indicated due to upper extremity radiculopathy, lermittes phenomena and falls. In the future surgery at adjacent levels may be necessary but these levels do not appear to be related to your current symptoms or signs.

## 2016-09-19 NOTE — Progress Notes (Signed)
Office Visit Note   Patient: Crystal [name]           Date of Birth: 23-Nov-1960           MRN: [id number] Visit Date: 09/19/2016              Requested by: Dorena Dew, FNP [address], [place] [address] PCP: Dorena Dew, FNP   Assessment & Plan: Visit Diagnoses:  1. Spinal stenosis of cervical region   2. Radiculopathy, cervical region     Plan: Avoid overhead lifting and overhead use of the arms. Do not lift greater than 5 lbs. Adjust head rest in vehicle to prevent hyperextension if rear ended. Take extra precautions to avoid falling, including use of a cane if you feel weak. Scheduling secretary Kandice Hams. will call you to arrange for surgery for your cervical spine. If you wish a second opinion please let us know and we can arrange for you. If you have worsening arm or leg numbness or weakness please call or go to an ER. We will contact your cardiologist and primary care physicians to seek clearance for your surgery. Surgery will be an anterior decompression of the cervical spinal canal at C6-7 and removal of bone pressing on the spinal cord with bone grafting and plate and screws, Local bone graft as well and allograft bone graft and vivigen.  Risks of surgery include risks of infection, bleeding and risks to the spinal cord and  Risks of sore throat and difficulty swallowing which should  Improve over the next 4-6 weeks following surgery. Surgery is indicated due to upper extremity radiculopathy, lermittes phenomena and falls. In the future surgery at adjacent levels may be necessary but these levels do not appear to be related to your current symptoms or signs.   Follow-Up Instructions: Return in about 4 weeks (around 10/17/2016) for post surgery follow up.   Orders:  No orders of the defined types were placed in this encounter.  No orders of the defined types were placed in this encounter.     Procedures: No procedures  performed   Clinical Data: No additional findings.   Subjective: Chief Complaint  Patient presents with  . Neck - Pain, Follow-up    Patient is coming in today to review MRI Cervical spine and to discuss surgery. Patient complains with neck and right arm pain with numbness for the past 3 years, becoming worse.She is experiencing pain in the right arm that is making it hard to sleep. Dropping items with the right arm. Pain is into the right middle finger and the adjacent index and ring finger. No bowel or bladder dysfunction. No gait disturbance.    Review of Systems  HENT: Negative.   Eyes: Negative.   Gastrointestinal: Negative.   Endocrine: Negative.   Genitourinary: Negative.   Musculoskeletal: Positive for back pain, neck pain and neck stiffness.  Skin: Negative.   Allergic/Immunologic: Negative.   Neurological: Positive for weakness and numbness.  Hematological: Negative.   Psychiatric/Behavioral: Negative.      Objective: Vital Signs: BP (!) 164/86   Pulse 90   Ht 5\' 1"  (1.549 m)   Wt 175 lb (79.4 kg)   LMP 06/02/2013   BMI 33.07 kg/m   Physical Exam  Constitutional: She is oriented to person, place, and time. She appears well-developed and well-nourished.  Eyes: EOM are normal. Pupils are equal, round, and reactive to light.  Neck: Normal range of motion. Neck supple.  Pulmonary/Chest: Effort normal and breath sounds normal.  Abdominal: Soft. Bowel sounds are normal.  Musculoskeletal: She exhibits tenderness.  Neurological: She is alert and oriented to person, place, and time.  Skin: Skin is warm and dry.  Psychiatric: She has a normal mood and affect. Her behavior is normal. Judgment and thought content normal.    Back Exam   Tenderness  The patient is experiencing tenderness in the cervical.  Range of Motion  Extension: abnormal  Flexion: normal  Lateral Bend Right: abnormal  Lateral Bend Left: normal  Rotation Right: abnormal  Rotation Left:  normal   Muscle Strength  Right Quadriceps:  5/5  Left Quadriceps:  5/5  Right Hamstrings:  5/5  Left Hamstrings:  5/5   Tests  Straight leg raise right: negative Straight leg raise left: negative  Reflexes  Patellar: normal Achilles: normal Biceps: normal Babinski's sign: normal   Other  Toe Walk: normal Heel Walk: normal Sensation: normal Gait: normal  Erythema: no back redness Scars: absent  Comments:  Right finger extension weakness, right triceps weakness 4/5.       Specialty Comments:  No specialty comments available.  Imaging: No results found.   PMFS History: Patient Active Problem List   Diagnosis Date Noted  . Hypertension 09/09/2016  . Diabetes mellitus ([place]) 09/09/2016  . Multiple thyroid nodules 01/27/2013  . Disorder of thyroid 07/07/2007  . VARICOSE VEIN 07/07/2007  . GERD 07/07/2007   Past Medical History:  Diagnosis Date  . Diabetes mellitus   . Hypertension     No family history on file.  No past surgical history on file. Social History   Occupational History  . Not on file.   Social History Main Topics  . Smoking status: Never Smoker  . Smokeless tobacco: Never Used  . Alcohol use No  . Drug use: No  . Sexual activity: Not on file

## 2016-09-26 ENCOUNTER — Ambulatory Visit (INDEPENDENT_AMBULATORY_CARE_PROVIDER_SITE_OTHER): Payer: Medicaid Other | Admitting: Specialist

## 2016-10-10 ENCOUNTER — Encounter (HOSPITAL_COMMUNITY): Payer: Self-pay | Admitting: *Deleted

## 2016-10-10 ENCOUNTER — Encounter (INDEPENDENT_AMBULATORY_CARE_PROVIDER_SITE_OTHER): Payer: Self-pay

## 2016-10-10 ENCOUNTER — Ambulatory Visit (INDEPENDENT_AMBULATORY_CARE_PROVIDER_SITE_OTHER): Payer: Self-pay | Admitting: Specialist

## 2016-10-10 NOTE — Progress Notes (Signed)
Spoke with pt for pre-op call. Originally called with an [place] interpreter, but pt stated that she didn't need an interpreter. She spoke and seemed to understand English pretty well. Pt denies cardiac history, chest pain or sob. Pt is diabetic. Last A1C was 9.3 on 09/09/16. Medications were adjusted that day. Pt states her fasting blood sugar is usually around 120. Instructed pt to not take her Glipizide this evening or in the AM and no Metformin in the AM. Instructed her to check her blood sugar in the AM and every 2 hours until she leaves for the hospital. If blood sugar is 70 or below, treat with 1/2 cup of clear juice (apple or cranberry) and recheck blood sugar 15 minutes after drinking juice. If blood sugar continues to be 70 or below, call the Short Stay department and ask to speak to a nurse. Pt voiced understanding.

## 2016-10-11 ENCOUNTER — Ambulatory Visit (HOSPITAL_COMMUNITY): Payer: Self-pay | Admitting: Certified Registered Nurse Anesthetist

## 2016-10-11 ENCOUNTER — Observation Stay (HOSPITAL_COMMUNITY)
Admission: RE | Admit: 2016-10-11 | Discharge: 2016-10-12 | Disposition: A | Discharge status: Home / Self Care | Payer: Self-pay | Source: Ambulatory Visit | Attending: Specialist | Admitting: Specialist

## 2016-10-11 ENCOUNTER — Encounter (HOSPITAL_COMMUNITY): Payer: Self-pay | Admitting: Certified Registered Nurse Anesthetist

## 2016-10-11 ENCOUNTER — Encounter (HOSPITAL_COMMUNITY)
Admission: RE | Disposition: A | Discharge status: Home / Self Care | Payer: Self-pay | Source: Ambulatory Visit | Attending: Specialist

## 2016-10-11 ENCOUNTER — Observation Stay (HOSPITAL_COMMUNITY): Payer: Self-pay

## 2016-10-11 DIAGNOSIS — Z7984 Long term (current) use of oral hypoglycemic drugs: Secondary | ICD-10-CM | POA: Insufficient documentation

## 2016-10-11 DIAGNOSIS — M5412 Radiculopathy, cervical region: Secondary | ICD-10-CM

## 2016-10-11 DIAGNOSIS — E119 Type 2 diabetes mellitus without complications: Secondary | ICD-10-CM | POA: Insufficient documentation

## 2016-10-11 DIAGNOSIS — Z886 Allergy status to analgesic agent status: Secondary | ICD-10-CM | POA: Insufficient documentation

## 2016-10-11 DIAGNOSIS — Z419 Encounter for procedure for purposes other than remedying health state, unspecified: Secondary | ICD-10-CM

## 2016-10-11 DIAGNOSIS — I10 Essential (primary) hypertension: Secondary | ICD-10-CM | POA: Insufficient documentation

## 2016-10-11 DIAGNOSIS — M4802 Spinal stenosis, cervical region: Principal | ICD-10-CM | POA: Diagnosis present

## 2016-10-11 DIAGNOSIS — E0590 Thyrotoxicosis, unspecified without thyrotoxic crisis or storm: Secondary | ICD-10-CM | POA: Insufficient documentation

## 2016-10-11 DIAGNOSIS — Z885 Allergy status to narcotic agent status: Secondary | ICD-10-CM | POA: Insufficient documentation

## 2016-10-11 DIAGNOSIS — M2578 Osteophyte, vertebrae: Secondary | ICD-10-CM | POA: Insufficient documentation

## 2016-10-11 DIAGNOSIS — K219 Gastro-esophageal reflux disease without esophagitis: Secondary | ICD-10-CM | POA: Insufficient documentation

## 2016-10-11 HISTORY — PX: ANTERIOR CERVICAL DECOMP/DISCECTOMY FUSION: SHX1161

## 2016-10-11 HISTORY — DX: Thyrotoxicosis, unspecified without thyrotoxic crisis or storm: E05.90

## 2016-10-11 LAB — URINALYSIS, ROUTINE W REFLEX MICROSCOPIC
Bilirubin Urine: NEGATIVE
Glucose, UA: NEGATIVE mg/dL
Hgb urine dipstick: NEGATIVE
Ketones, ur: NEGATIVE mg/dL
Leukocytes, UA: NEGATIVE
Nitrite: NEGATIVE
Protein, ur: NEGATIVE mg/dL
Specific Gravity, Urine: 1.014 (ref 1.005–1.030)
pH: 5 (ref 5.0–8.0)

## 2016-10-11 LAB — COMPREHENSIVE METABOLIC PANEL
ALT: 27 U/L (ref 14–54)
AST: 32 U/L (ref 15–41)
Albumin: 3.8 g/dL (ref 3.5–5.0)
Alkaline Phosphatase: 94 U/L (ref 38–126)
Anion gap: 12 (ref 5–15)
BUN: 7 mg/dL (ref 6–20)
CO2: 22 mmol/L (ref 22–32)
Calcium: 9.3 mg/dL (ref 8.9–10.3)
Chloride: 106 mmol/L (ref 101–111)
Creatinine, Ser: 0.35 mg/dL — ABNORMAL LOW (ref 0.44–1.00)
GFR calc Af Amer: >60 mL/min (ref >60)
GFR calc non Af Amer: >60 mL/min (ref >60)
Glucose, Bld: 169 mg/dL — ABNORMAL HIGH (ref 65–99)
Potassium: 4.8 mmol/L (ref 3.5–5.1)
Sodium: 140 mmol/L (ref 135–145)
Total Bilirubin: 1 mg/dL (ref 0.3–1.2)
Total Protein: 7 g/dL (ref 6.5–8.1)

## 2016-10-11 LAB — CBC
HCT: 40.9 % (ref 36.0–46.0)
Hemoglobin: 13 g/dL (ref 12.0–15.0)
MCH: 24.1 pg — ABNORMAL LOW (ref 26.0–34.0)
MCHC: 31.8 g/dL (ref 30.0–36.0)
MCV: 75.7 fL — ABNORMAL LOW (ref 78.0–100.0)
Platelets: 354 10*3/uL (ref 150–400)
RBC: 5.4 MIL/uL — ABNORMAL HIGH (ref 3.87–5.11)
RDW: 14.8 % (ref 11.5–15.5)
WBC: 6.8 10*3/uL (ref 4.0–10.5)

## 2016-10-11 LAB — GLUCOSE, CAPILLARY
Glucose-Capillary: 182 mg/dL — ABNORMAL HIGH (ref 65–99)
Glucose-Capillary: 145 mg/dL — ABNORMAL HIGH (ref 65–99)
Glucose-Capillary: 245 mg/dL — ABNORMAL HIGH (ref 65–99)

## 2016-10-11 LAB — APTT: aPTT: 26 seconds (ref 24–36)

## 2016-10-11 LAB — PROTIME-INR
INR: 0.99
Prothrombin Time: 13.1 seconds (ref 11.4–15.2)

## 2016-10-11 LAB — SURGICAL PCR SCREEN
MRSA, PCR: NEGATIVE
Staphylococcus aureus: NEGATIVE

## 2016-10-11 SURGERY — ANTERIOR CERVICAL DECOMPRESSION/DISCECTOMY FUSION 1 LEVEL
Anesthesia: General | Site: Spine Cervical

## 2016-10-11 MED ORDER — INSULIN ASPART 100 UNIT/ML ~~LOC~~ SOLN: 0.0000 [IU] | Freq: Three times a day (TID) | SUBCUTANEOUS | Status: DC

## 2016-10-11 MED ORDER — PROMETHAZINE HCL 25 MG/ML IJ SOLN
INTRAMUSCULAR | Status: AC
  Filled 2016-10-11: qty 1

## 2016-10-11 MED ORDER — CEFAZOLIN SODIUM-DEXTROSE 2-4 GM/100ML-% IV SOLN
2.0000 g | INTRAVENOUS | Status: AC
  Administered 2016-10-11: 2 g via INTRAVENOUS

## 2016-10-11 MED ORDER — THROMBIN 20000 UNITS EX KIT
PACK | CUTANEOUS | Status: DC | PRN
  Administered 2016-10-11: 20 mL via TOPICAL

## 2016-10-11 MED ORDER — FLEET ENEMA 7-19 GM/118ML RE ENEM: 1.0000 | ENEMA | Freq: Once | RECTAL | Status: DC | PRN

## 2016-10-11 MED ORDER — LORATADINE 10 MG PO TABS: 10.0000 mg | ORAL_TABLET | Freq: Every day | ORAL | Status: DC

## 2016-10-11 MED ORDER — PROPOFOL 500 MG/50ML IV EMUL
INTRAVENOUS | Status: DC | PRN
  Administered 2016-10-11: 100 ug/kg/min via INTRAVENOUS

## 2016-10-11 MED ORDER — THROMBIN 20000 UNITS EX SOLR
CUTANEOUS | Status: AC
  Filled 2016-10-11: qty 20000

## 2016-10-11 MED ORDER — SODIUM CHLORIDE 0.9 % IV SOLN: 250.0000 mL | INTRAVENOUS | Status: DC

## 2016-10-11 MED ORDER — HYDROCODONE-ACETAMINOPHEN 5-325 MG PO TABS: 1.0000 | ORAL_TABLET | ORAL | 0 refills | Status: DC | PRN

## 2016-10-11 MED ORDER — ALUM & MAG HYDROXIDE-SIMETH 200-200-20 MG/5ML PO SUSP: 30.0000 mL | Freq: Four times a day (QID) | ORAL | Status: DC | PRN

## 2016-10-11 MED ORDER — BUPIVACAINE LIPOSOME 1.3 % IJ SUSP
INTRAMUSCULAR | Status: DC | PRN
  Administered 2016-10-11: 6 mL

## 2016-10-11 MED ORDER — METHOCARBAMOL 1000 MG/10ML IJ SOLN
500.0000 mg | Freq: Four times a day (QID) | INTRAVENOUS | Status: DC | PRN
  Filled 2016-10-11: qty 5

## 2016-10-11 MED ORDER — BUPIVACAINE LIPOSOME 1.3 % IJ SUSP
20.0000 mL | INTRAMUSCULAR | Status: DC
  Filled 2016-10-11: qty 20

## 2016-10-11 MED ORDER — PROPOFOL 10 MG/ML IV BOLUS
INTRAVENOUS | Status: DC | PRN
  Administered 2016-10-11: 140 mg via INTRAVENOUS
  Administered 2016-10-11: 60 mg via INTRAVENOUS

## 2016-10-11 MED ORDER — METHOCARBAMOL 500 MG PO TABS: 500.0000 mg | ORAL_TABLET | Freq: Four times a day (QID) | ORAL | 1 refills | Status: DC | PRN

## 2016-10-11 MED ORDER — POLYETHYLENE GLYCOL 3350 17 G PO PACK: 17.0000 g | PACK | Freq: Every day | ORAL | Status: DC | PRN

## 2016-10-11 MED ORDER — MIDAZOLAM HCL 5 MG/5ML IJ SOLN
INTRAMUSCULAR | Status: DC | PRN
  Administered 2016-10-11: 2 mg via INTRAVENOUS

## 2016-10-11 MED ORDER — ONDANSETRON HCL 4 MG/2ML IJ SOLN
4.0000 mg | INTRAMUSCULAR | Status: DC | PRN
  Administered 2016-10-11: 4 mg via INTRAVENOUS
  Filled 2016-10-11: qty 2

## 2016-10-11 MED ORDER — PROPOFOL 10 MG/ML IV BOLUS
INTRAVENOUS | Status: AC
  Filled 2016-10-11: qty 20

## 2016-10-11 MED ORDER — LACTATED RINGERS IV SOLN
INTRAVENOUS | Status: DC
  Administered 2016-10-11 (×2): via INTRAVENOUS

## 2016-10-11 MED ORDER — BISACODYL 5 MG PO TBEC: 5.0000 mg | DELAYED_RELEASE_TABLET | Freq: Every day | ORAL | Status: DC | PRN

## 2016-10-11 MED ORDER — HYDROMORPHONE HCL 1 MG/ML IJ SOLN
0.2500 mg | INTRAMUSCULAR | Status: DC | PRN
  Administered 2016-10-11: 0.5 mg via INTRAVENOUS

## 2016-10-11 MED ORDER — FENTANYL CITRATE (PF) 100 MCG/2ML IJ SOLN
INTRAMUSCULAR | Status: DC | PRN
  Administered 2016-10-11: 50 ug via INTRAVENOUS
  Administered 2016-10-11: 100 ug via INTRAVENOUS
  Administered 2016-10-11: 50 ug via INTRAVENOUS

## 2016-10-11 MED ORDER — PHENYLEPHRINE HCL 10 MG/ML IJ SOLN
INTRAVENOUS | Status: DC | PRN
  Administered 2016-10-11: 15 ug/min via INTRAVENOUS

## 2016-10-11 MED ORDER — PANTOPRAZOLE SODIUM 40 MG IV SOLR
40.0000 mg | Freq: Every day | INTRAVENOUS | Status: DC
  Administered 2016-10-11: 40 mg via INTRAVENOUS
  Filled 2016-10-11: qty 40

## 2016-10-11 MED ORDER — DOCUSATE SODIUM 100 MG PO CAPS
100.0000 mg | ORAL_CAPSULE | Freq: Two times a day (BID) | ORAL | Status: DC
  Administered 2016-10-12: 100 mg via ORAL
  Filled 2016-10-11: qty 1

## 2016-10-11 MED ORDER — LACTATED RINGERS IV SOLN
INTRAVENOUS | Status: DC | PRN
  Administered 2016-10-11: 14:00:00 via INTRAVENOUS

## 2016-10-11 MED ORDER — LIDOCAINE HCL (CARDIAC) 20 MG/ML IV SOLN
INTRAVENOUS | Status: DC | PRN
  Administered 2016-10-11: 50 mg via INTRAVENOUS

## 2016-10-11 MED ORDER — HYDROCODONE-ACETAMINOPHEN 5-325 MG PO TABS
1.0000 | ORAL_TABLET | ORAL | Status: DC | PRN
Start: 2016-10-11 — End: 2016-10-12

## 2016-10-11 MED ORDER — SODIUM CHLORIDE 0.9 % IV SOLN
INTRAVENOUS | Status: DC
  Administered 2016-10-11: 19:00:00 via INTRAVENOUS

## 2016-10-11 MED ORDER — LACTATED RINGERS IV SOLN: INTRAVENOUS | Status: DC

## 2016-10-11 MED ORDER — PROMETHAZINE HCL 25 MG/ML IJ SOLN: 6.2500 mg | INTRAMUSCULAR | Status: DC | PRN

## 2016-10-11 MED ORDER — 0.9 % SODIUM CHLORIDE (POUR BTL) OPTIME
TOPICAL | Status: DC | PRN
  Administered 2016-10-11: 1000 mL

## 2016-10-11 MED ORDER — FENTANYL CITRATE (PF) 100 MCG/2ML IJ SOLN
INTRAMUSCULAR | Status: AC
  Filled 2016-10-11: qty 2

## 2016-10-11 MED ORDER — BUPIVACAINE HCL 0.5 % IJ SOLN
INTRAMUSCULAR | Status: DC | PRN
  Administered 2016-10-11: 6 mL

## 2016-10-11 MED ORDER — CEFAZOLIN SODIUM-DEXTROSE 2-4 GM/100ML-% IV SOLN
2.0000 g | Freq: Three times a day (TID) | INTRAVENOUS | Status: AC
  Administered 2016-10-11 – 2016-10-12 (×2): 2 g via INTRAVENOUS
  Filled 2016-10-11 (×2): qty 100

## 2016-10-11 MED ORDER — MENTHOL 3 MG MT LOZG
1.0000 | LOZENGE | OROMUCOSAL | Status: DC | PRN
  Filled 2016-10-11: qty 9

## 2016-10-11 MED ORDER — MIDAZOLAM HCL 2 MG/2ML IJ SOLN
INTRAMUSCULAR | Status: AC
  Filled 2016-10-11: qty 2

## 2016-10-11 MED ORDER — SUCCINYLCHOLINE 20MG/ML (10ML) SYRINGE FOR MEDFUSION PUMP - OPTIME
INTRAMUSCULAR | Status: DC | PRN
  Administered 2016-10-11: 100 mg via INTRAVENOUS

## 2016-10-11 MED ORDER — ACETAMINOPHEN 325 MG PO TABS: 650.0000 mg | ORAL_TABLET | ORAL | Status: DC | PRN

## 2016-10-11 MED ORDER — METHOCARBAMOL 500 MG PO TABS: 500.0000 mg | ORAL_TABLET | Freq: Four times a day (QID) | ORAL | Status: DC | PRN

## 2016-10-11 MED ORDER — MORPHINE SULFATE (PF) 2 MG/ML IV SOLN
1.0000 mg | INTRAVENOUS | Status: DC | PRN
Start: 2016-10-11 — End: 2016-10-12
  Administered 2016-10-11: 4 mg via INTRAVENOUS
  Administered 2016-10-12 (×2): 2 mg via INTRAVENOUS
  Filled 2016-10-11: qty 1
  Filled 2016-10-11 (×2): qty 2

## 2016-10-11 MED ORDER — ONDANSETRON HCL 4 MG/2ML IJ SOLN
INTRAMUSCULAR | Status: DC | PRN
  Administered 2016-10-11: 4 mg via INTRAVENOUS

## 2016-10-11 MED ORDER — PHENOL 1.4 % MT LIQD: 1.0000 | OROMUCOSAL | Status: DC | PRN

## 2016-10-11 MED ORDER — MEPERIDINE HCL 25 MG/ML IJ SOLN: 6.2500 mg | INTRAMUSCULAR | Status: DC | PRN

## 2016-10-11 MED ORDER — ACETAMINOPHEN 650 MG RE SUPP: 650.0000 mg | RECTAL | Status: DC | PRN

## 2016-10-11 MED ORDER — SODIUM CHLORIDE 0.9% FLUSH: 3.0000 mL | INTRAVENOUS | Status: DC | PRN

## 2016-10-11 MED ORDER — HYDROMORPHONE HCL 1 MG/ML IJ SOLN
INTRAMUSCULAR | Status: AC
  Filled 2016-10-11: qty 0.5

## 2016-10-11 MED ORDER — ACETAMINOPHEN 500 MG PO TABS: 1000.0000 mg | ORAL_TABLET | Freq: Every day | ORAL | Status: DC | PRN

## 2016-10-11 MED ORDER — AMLODIPINE BESYLATE 10 MG PO TABS
10.0000 mg | ORAL_TABLET | Freq: Every day | ORAL | Status: DC
  Administered 2016-10-12: 10 mg via ORAL
  Filled 2016-10-11: qty 1

## 2016-10-11 MED ORDER — OXYCODONE-ACETAMINOPHEN 5-325 MG PO TABS: 1.0000 | ORAL_TABLET | ORAL | Status: DC | PRN

## 2016-10-11 MED ORDER — SODIUM CHLORIDE 0.9 % IV SOLN
0.0125 ug/kg/min | Freq: Once | INTRAVENOUS | Status: AC
  Administered 2016-10-11: .05 ug/kg/min via INTRAVENOUS
  Filled 2016-10-11: qty 2000

## 2016-10-11 MED ORDER — CHLORHEXIDINE GLUCONATE 4 % EX LIQD: 60.0000 mL | Freq: Once | CUTANEOUS | Status: DC

## 2016-10-11 MED ORDER — SODIUM CHLORIDE 0.9% FLUSH
3.0000 mL | Freq: Two times a day (BID) | INTRAVENOUS | Status: DC
  Administered 2016-10-12: 3 mL via INTRAVENOUS

## 2016-10-11 MED ORDER — BUPIVACAINE HCL (PF) 0.5 % IJ SOLN
INTRAMUSCULAR | Status: AC
  Filled 2016-10-11: qty 30

## 2016-10-11 MED ORDER — CEFAZOLIN SODIUM-DEXTROSE 2-4 GM/100ML-% IV SOLN
INTRAVENOUS | Status: AC
  Filled 2016-10-11: qty 100

## 2016-10-11 SURGICAL SUPPLY — 65 items
ADH SKN CLS APL DERMABOND .7 (GAUZE/BANDAGES/DRESSINGS) ×1
APL SKNCLS STERI-STRIP NONHPOA (GAUZE/BANDAGES/DRESSINGS) ×1
BENZOIN TINCTURE PRP APPL 2/3 (GAUZE/BANDAGES/DRESSINGS) ×2 IMPLANT
BIT DRILL SKYLINE 12MM (BIT) ×1 IMPLANT
BLADE SURG ROTATE 9660 (MISCELLANEOUS) IMPLANT
BONE VIVIGEN FORMABLE 1.3CC (Bone Implant) ×2 IMPLANT
BUR MATCHSTICK NEURO 3.0 LAGG (BURR) IMPLANT
BUR RND FLUTED 2.5 (BURR) IMPLANT
BUR SABER RD CUTTING 3.0 (BURR) IMPLANT
CANISTER SUCTION 2500CC (MISCELLANEOUS) ×2 IMPLANT
COLLAR CERV LO CONTOUR FIRM DE (SOFTGOODS) IMPLANT
COVER SURGICAL LIGHT HANDLE (MISCELLANEOUS) ×2 IMPLANT
DERMABOND ADVANCED (GAUZE/BANDAGES/DRESSINGS) ×1
DERMABOND ADVANCED .7 DNX12 (GAUZE/BANDAGES/DRESSINGS) ×1 IMPLANT
DRAIN TLS ROUND 10FR (DRAIN) IMPLANT
DRAPE C-ARM 42X72 X-RAY (DRAPES) IMPLANT
DRAPE MICROSCOPE LEICA (MISCELLANEOUS) ×2 IMPLANT
DRAPE POUCH INSTRU U-SHP 10X18 (DRAPES) ×2 IMPLANT
DRAPE SURG 17X23 STRL (DRAPES) ×6 IMPLANT
DRILL BIT SKYLINE 12MM (BIT) ×2
DRSG MEPILEX BORDER 4X4 (GAUZE/BANDAGES/DRESSINGS) ×2 IMPLANT
DURAPREP 6ML APPLICATOR 50/CS (WOUND CARE) ×2 IMPLANT
ELECT BLADE 4.0 EZ CLEAN MEGAD (MISCELLANEOUS)
ELECT COATED BLADE 2.86 ST (ELECTRODE) ×2 IMPLANT
ELECT REM PT RETURN 9FT ADLT (ELECTROSURGICAL) ×2
ELECTRODE BLDE 4.0 EZ CLN MEGD (MISCELLANEOUS) IMPLANT
ELECTRODE REM PT RTRN 9FT ADLT (ELECTROSURGICAL) ×1 IMPLANT
FEE INTRAOP MONITOR IMPULS NCS (MISCELLANEOUS) ×1 IMPLANT
GLOVE BIOGEL PI IND STRL 8 (GLOVE) ×1 IMPLANT
GLOVE BIOGEL PI INDICATOR 8 (GLOVE) ×1
GLOVE ECLIPSE 9.0 STRL (GLOVE) ×2 IMPLANT
GLOVE SURG 8.5 LATEX PF (GLOVE) ×2 IMPLANT
GLOVE SURG ORTHO 8.0 STRL STRW (GLOVE) ×2 IMPLANT
GOWN STRL REUS W/ TWL LRG LVL3 (GOWN DISPOSABLE) ×1 IMPLANT
GOWN STRL REUS W/TWL 2XL LVL3 (GOWN DISPOSABLE) ×4 IMPLANT
GOWN STRL REUS W/TWL LRG LVL3 (GOWN DISPOSABLE) ×2
HEAD HALTER (SOFTGOODS) ×2 IMPLANT
INTRAOP MONITOR FEE IMPULS NCS (MISCELLANEOUS) ×1
INTRAOP MONITOR FEE IMPULSE (MISCELLANEOUS) ×1
KIT BASIN OR (CUSTOM PROCEDURE TRAY) ×2 IMPLANT
KIT ROOM TURNOVER OR (KITS) ×2 IMPLANT
NEEDLE SPNL 20GX3.5 QUINCKE YW (NEEDLE) ×4 IMPLANT
NS IRRIG 1000ML POUR BTL (IV SOLUTION) ×2 IMPLANT
PACK ORTHO CERVICAL (CUSTOM PROCEDURE TRAY) ×2 IMPLANT
PAD ARMBOARD 7.5X6 YLW CONV (MISCELLANEOUS) ×4 IMPLANT
PATTIES SURGICAL .5 X.5 (GAUZE/BANDAGES/DRESSINGS) IMPLANT
PIN DISTRACTION 14MM (PIN) ×4 IMPLANT
PLATE SKYLINE 12MM (Plate) ×2 IMPLANT
SCREW SKYLINE VARIABLE LG (Screw) ×2 IMPLANT
SCREW VARIABLE SELF TAP 12MM (Screw) ×8 IMPLANT
SPACER ADV ACF 9MM (Bone Implant) ×2 IMPLANT
SPONGE INTESTINAL PEANUT (DISPOSABLE) IMPLANT
SPONGE LAP 4X18 X RAY DECT (DISPOSABLE) IMPLANT
SPONGE SURGIFOAM ABS GEL 100 (HEMOSTASIS) ×2 IMPLANT
STRIP CLOSURE SKIN 1/2X4 (GAUZE/BANDAGES/DRESSINGS) ×2 IMPLANT
SUT ETHILON 4 0 PS 2 18 (SUTURE) IMPLANT
SUT VIC AB 2-0 CT1 27 (SUTURE) ×2
SUT VIC AB 2-0 CT1 TAPERPNT 27 (SUTURE) ×1 IMPLANT
SUT VIC AB 3-0 X1 27 (SUTURE) IMPLANT
SUT VICRYL 4-0 PS2 18IN ABS (SUTURE) ×2 IMPLANT
SYR 20CC LL (SYRINGE) ×2 IMPLANT
SYSTEM CHEST DRAIN TLS 7FR (DRAIN) IMPLANT
TOWEL OR 17X24 6PK STRL BLUE (TOWEL DISPOSABLE) ×2 IMPLANT
TOWEL OR 17X26 10 PK STRL BLUE (TOWEL DISPOSABLE) ×2 IMPLANT
WATER STERILE IRR 1000ML POUR (IV SOLUTION) ×2 IMPLANT

## 2016-10-11 NOTE — Progress Notes (Signed)
Patient ID: Crystal [name], female   DOB: 06/22/1961, 56 y.o.   MRN: [id number] Seen in the PACU, awake, alert and oriented x 4. UE and LE N-V normal. Will have change from [place] collar to soft C-Collar, but should keep [place] collar for showering purposes. No complains, in good spirits. Will plan for discharge tomorrow AM. Meds placed on chart.

## 2016-10-11 NOTE — Progress Notes (Signed)
Orthopedic Tech Progress Note Patient Details:  Crystal [name] [birth year] [id number]  Ortho Devices Type of Ortho Device: Soft collar Ortho Device/Splint Location: Provided [place] Soft Collar as requested. (Ortho Tech)   Kristopher Oppenheim 10/11/2016, 4:32 PM

## 2016-10-11 NOTE — Anesthesia Procedure Notes (Signed)
Procedure Name: Intubation Date/Time: 10/11/2016 1:40 PM Performed by: Shirlyn Goltz Pre-anesthesia Checklist: Patient identified, Emergency Drugs available, Suction available and Patient being monitored Patient Re-evaluated:Patient Re-evaluated prior to inductionOxygen Delivery Method: Circle system utilized Preoxygenation: Pre-oxygenation with 100% oxygen Intubation Type: IV induction Ventilation: Mask ventilation without difficulty Laryngoscope Size: Glidescope and 3 Grade View: Grade I Tube type: Oral Tube size: 7.5 mm Number of attempts: 1 Airway Equipment and Method: Rigid stylet and Video-laryngoscopy Placement Confirmation: ETT inserted through vocal cords under direct vision,  positive ETCO2 and breath sounds checked- equal and bilateral Secured at: 22 cm Tube secured with: Tape Dental Injury: Teeth and Oropharynx as per pre-operative assessment

## 2016-10-11 NOTE — Transfer of Care (Signed)
Immediate Anesthesia Transfer of Care Note  Patient: Nimisha Heidrick  Procedure(s) Performed: Procedure(s): ANTERIOR CERVICAL DISCECTOMY FUSION C6-7 WITH EXCISION OF OSTEOPHYTE (N/A)  Patient Location: PACU  Anesthesia Type:General  Level of Consciousness: awake, alert , oriented and patient cooperative  Airway & Oxygen Therapy: Patient Spontanous Breathing and Patient connected to face mask oxygen  Post-op Assessment: Report given to RN, Post -op Vital signs reviewed and stable and Patient moving all extremities X 4  Post vital signs: Reviewed and stable  Last Vitals:  Vitals:   10/11/16 1632 10/11/16 1633  BP:  140/87  Pulse:  96  Resp:  13  Temp: 36.1 C     Last Pain:  Vitals:   10/11/16 1133  TempSrc: Oral         Complications: No apparent anesthesia complications

## 2016-10-11 NOTE — Op Note (Signed)
10/11/2016  4:10 PM  PATIENT:  Crystal [name]  56 y.o. female  MRN: [id number]  OPERATIVE REPORT  PRE-OPERATIVE DIAGNOSIS:  C6-7 central spinal stenosis with right C7 radiculopathy  POST-OPERATIVE DIAGNOSIS:  C6-7 central spinal stenosis with right C7 radiculopathy  PROCEDURE:  Procedure(s): ANTERIOR CERVICAL DISCECTOMY FUSION C6-7 WITH EXCISION OF OSTEOPHYTE    SURGEON:  [name], MD     ASSISTANT:  Benjiman Core, PA-C  (Present throughout the entire procedure and necessary for completion of procedure in a timely manner)     ANESTHESIA:  General,supplemented with local MARCAINE 0.5% 1:1 EXPAREL 1.3%   Amount: 10 ml, Dr Marcie Bal.    COMPLICATIONS:  None.     COMPONENTS:  Implant Name Type Inv. Item Serial No. Manufacturer Lot No. LRB No. Used  SPACER ADV ACF 9MM - [id number] Bone Implant SPACER ADV ACF 9MM [id number] MUSCULOSKELE[employer] TRANSPLANT FNDN  N/A 1  BONE VIVIGEN FORMABLE 1.3CC - [id number] Bone Implant BONE VIVIGEN FORMABLE 1.3CC [id number] [facility]   N/A 1    PROCEDURE:The patient was met in the holding area, and the appropriate  left C6-7 cervical level identified and marked with an "x" and my initials. The patient was then transported to OR and was placed on the operative table in a supine position head supported on the well padded [place] horseshoe. The patient was then placed under  general anesthesia without difficulty intubated atraumaticly.      Cervical spine was positioned with a [place] horseshoe and 5 pound cervical halter traction. All pressure points well padded and semi-beach chair position. Arm holder both arms. Standard prep with DuraPrep solution the anterior cervical spine chest. Draped in the usual manner. Iodine vi drape was used. Pre incision neuromonitoring baseline obtained and was normal.Standard timeout protocol was carried out identifying the patient procedure side of the procedure and level. The skin the left neck  was infiltrated with MARCAINE 0.5% 1:1 EXPAREL 1.3%   Amount: 10 ml. This at the level of expected C6-7incision and also along a skin crease in line with the patients lines of Langer. Incision transverse at the C6 level and carried down to the level of the platysma. Then was carried down to the anterior aspect of the sternocleidomastoid muscle. The interval between the trachea and esophagus medially and the carotid sheath laterally was developed as a Metzenbaum scissors and blunt dissection exposing the anterior aspect of cervical spine at the C6 level.  The prevertebral fascia anterior to the cervical was cauterized with bipolar and teased across the midline with a Art therapist. An 18-gauge spinal needle was placed with sheath intact allowing only a centimeter to extend into the C6-7 disc and observed on lateral C-arm fluoro  at the C6-7 level. Handheld Cloward retraction of the soft tissues while identifying the level at C6-7 and also while removing a portion of the anterior aspect of the disc with15 blade scalpel and pituitary. Medial border of the longus collie muscles was carefully elevated bilaterally and self-retaining retractors were introduced the foot of the blade beneath the medial border of the longus colli muscles. Soft tissue overlying the anterior borders of the disc space at C6-7 level carefully debridement of soft tissue back to bony edges. The anterior lip osteophytes were then resected using rongeur. This bone was preserved for later bone grafting purposes. The disc space was then first prepared using loupe magnification and headlight with resection of degenerative disc annulus anteriorly and posteriorly and cartilaginous endplates using micro-curettes pituitaries and  a high-speed bur. The operating room microscope was draped sterilely and brought into the field. Under the operating room microscope and posterior aspect of the disc was excised using micro-curettes pituitary rongeur and times  per. Posterior lip osteophytes were drilled using a high-speed bur and a carefully resected with 1 and 2 mm Kerrison foraminotomy performed over both C7 nerve roots using 1 and 2 mm Kerrisons disc herniation material was noted centrally and into the right foramen some of which represented reflected portion of the patient's annulus into the neuroforamen. Following decompression of the spinal cord and both C7 nerve roots, irrigation was carried out. The endplates of the inferior aspect of C6 and superior aspect of C7 were carefully prepared using a high-speed bur to parallel. The disc space was then sounded utilized and a precontoured sounder for the transgraft implant. Surrounding was carried up to a 51m implant.  920mACDF allograft spacer was felt to provide best fit for the C6-7 disc space. The transgraft permanent lordotic allograft implant was then brought onto the field. It was then packed with local bone graft that had been harvested previously and vivigen II. The implant was then carefully placed over the intervertebral disc space at C6-7 level. Care taken to ensure that no bone or soft tissue debris within the disc space that could be retropulsed with insertion of the cage and bone graft. The implant was then impacted into place with the head placed in longitudinal cervical traction.  The implant was felt to be in excellent position alignment. Cervical distraction instrumentation was removed and the screw post holes coated with wax for hemostasis. Length of the plate was then chosen using bone wax applied to a cottonoid string and measuring from the edge of the lower cervical vertebral border of C6 the upper cervical border of C7  A  12 millimeter plate was chosen. 12 mm screws were then placed into the C6  locking the plate in place. Additional 12 mm screws were then placed in the C7 level  cervical traction had been released prior to screw placement. Intraoperative lateral radiograph demonstrated the plates  and screws in good position alignment no sign of impingement upon the cervical canal. The graft appeared in good position alignment.  Upper extremity longitudinal traction using wrist restraints was not necessary to obtain visualization of the lower cervical level with C-arm fluoro. Intraoperative neuromonitoring including motor testin qas negative for changes and was normal.  This point irrigation was carried out at cervical incision site.The esophagus examined at the cervical level  and found to be normal. Irrigation was again carried out there was no active bleeding present. The incisions were then closed by approximating the deep subcutaneous layers the platysma layer with interrupted 3-0 Vicryl suture and the superficial fascia overlying the sternocleidomastoid muscle with interrupted 3-0 Vicryl sutures. The subcutaneous layers were approximated with interrupted 3-0 Vicryl sutures as were the superficial layers. The skin was closed with a running subcutaneous stitch of 4-0 Vicryl at the operative C&transverse incision site. Skin was approximated with Dermabond. Mepilex bandage was applied. A [place] collar was then applied to the cervical spine released on the cervical spine and drapes were removed. Physician assistant's responsibilities:Niylah Hassan OwRicard DillonPA-C perform the duties of assistant physician and surgeon during this case present from the beginning of the case to the end of the case. He assisted with careful retraction of neural structures suctioning about her elements including cervical cord and C7 nerve root. Performed closure of the incision on the  ligamentum nuchae to the skin and application of dressing. He assisted in positioning the patient had removal the patient from the OR table to her stretcher.     [name] 10/11/2016, 4:10 PM

## 2016-10-11 NOTE — Anesthesia Postprocedure Evaluation (Signed)
Anesthesia Post Note  Patient: Crystal [name]  Procedure(s) Performed: Procedure(s) (LRB): ANTERIOR CERVICAL DISCECTOMY FUSION C6-7 WITH EXCISION OF OSTEOPHYTE (N/A)  Patient location during evaluation: PACU Anesthesia Type: General Level of consciousness: awake and alert and patient cooperative Pain management: pain level controlled Vital Signs Assessment: post-procedure vital signs reviewed and stable Respiratory status: spontaneous breathing and respiratory function stable Cardiovascular status: stable Anesthetic complications: no       Last Vitals:  Vitals:   10/11/16 1648 10/11/16 1720  BP: (!) 154/81 (!) 149/79  Pulse: 89 80  Resp: 17 16  Temp:      Last Pain:  Vitals:   10/11/16 1133  TempSrc: Oral                 Polk Minor S

## 2016-10-11 NOTE — Brief Op Note (Signed)
10/11/2016  4:07 PM  PATIENT:  Crystal [name]  56 y.o. female  PRE-OPERATIVE DIAGNOSIS:  C6-7 central spinal stenosis with right C7 radiculopathy  POST-OPERATIVE DIAGNOSIS:  C6-7 central spinal stenosis with right C7 radiculopathy  PROCEDURE:  Procedure(s): ANTERIOR CERVICAL DISCECTOMY FUSION C6-7 WITH EXCISION OF OSTEOPHYTE (N/A)  SURGEON:  Surgeon(s) and Role:    * [name], MD - Primary  PHYSICIAN ASSISTANT: Esaw Grandchild   ANESTHESIA:   local and general, Dr. Marcie Bal  EBL:  Total I/O In: 1000 [I.V.:1000] Out: 100 [Blood:100]  BLOOD ADMINISTERED:none  DRAINS: none   LOCAL MEDICATIONS USED:  MARCAINE 0.5% 1:1 EXPAREL 1.3%   Amount: 10 ml  SPECIMEN:  No Specimen  DISPOSITION OF SPECIMEN:  N/A  COUNTS:  YES  DICTATION: .Dragon Dictation  PLAN OF CARE: Admit for overnight observation  PATIENT DISPOSITION:  PACU - hemodynamically stable.   Delay start of Pharmacological VTE agent (>24hrs) due to surgical blood loss or risk of bleeding: yes

## 2016-10-11 NOTE — Interval H&P Note (Signed)
History and Physical Interval Note:  10/11/2016 1:18 PM  Crystal [name]  has presented today for surgery, with the diagnosis of C6-7 central spinal stenosis with right C7 radiculopathy  The various methods of treatment have been discussed with the patient and family. After consideration of risks, benefits and other options for treatment, the patient has consented to  Procedure(s): ANTERIOR CERVICAL DISCECTOMY FUSION C6-7 WITH EXCISION OF OSTEOPHYTE (N/A) as a surgical intervention .  The patient's history has been reviewed, patient examined, no change in status, stable for surgery.  I have reviewed the patient's chart and labs.  Questions were answered to the patient's satisfaction.     [name]

## 2016-10-11 NOTE — Progress Notes (Signed)
Orthopedic Tech Progress Note Patient Details:  Crystal [name] [birth year] [id number]  Ortho Devices Type of Ortho Device: Soft collar Ortho Device/Splint Location: Provided [place] Soft Collar as requested. (Ortho Tech) Ortho Device/Splint Interventions: Application   [name] 10/11/2016, 5:41 PM

## 2016-10-11 NOTE — H&P (Signed)
PREOPERATIVE H&P  Chief Complaint: C6-7 central spinal stenosis with right C7 radiculopathy  HPI: Crystal [name] is a 56 y.o. female who presents for preoperative history and physical with a diagnosis of C6-7 central spinal stenosis with right C7 radiculopathy. Symptoms are rated as moderate to severe, and have been worsening.  This is significantly impairing activities of daily living.  She has elected for surgical management.   Past Medical History:  Diagnosis Date  . Diabetes mellitus   . Hypertension   . Hyperthyroidism    Past Surgical History:  Procedure Laterality Date  . CESAREAN SECTION    . TUBAL LIGATION     Social History   Social History  . Marital status: Married    Spouse name: N/A  . Number of children: N/A  . Years of education: N/A   Social History Main Topics  . Smoking status: Never Smoker  . Smokeless tobacco: Never Used  . Alcohol use No  . Drug use: No  . Sexual activity: Not Asked   Other Topics Concern  . None   Social History Narrative  . None   History reviewed. No pertinent family history. Allergies  Allergen Reactions  . Aspirin Nausea And Vomiting  . Tramadol     Upset stomach    Prior to Admission medications   Medication Sig Start Date End Date Taking? Authorizing Provider  amLODipine (NORVASC) 10 MG tablet Take 1 tablet (10 mg total) by mouth daily. 09/09/16  Yes Dorena Dew, FNP  glipiZIDE (GLUCOTROL) 10 MG tablet Take 1 tablet (10 mg total) by mouth 2 (two) times daily before a meal. 09/09/16  Yes Dorena Dew, FNP  metFORMIN (GLUCOPHAGE) 1000 MG tablet Take 1 tablet (1,000 mg total) by mouth 2 (two) times daily with a meal. 09/09/16  Yes Dorena Dew, FNP  methimazole (TAPAZOLE) 5 MG tablet Take 1 tablet (5 mg total) by mouth 2 (two) times daily. 09/10/16  Yes Dorena Dew, FNP  acetaminophen (TYLENOL) 500 MG tablet Take 1,000 mg by mouth daily as needed for moderate pain.     Historical Provider, MD  Blood Glucose  Monitoring Suppl (TRUE METRIX AIR GLUCOSE METER) w/Device KIT 1 each by Does not apply route every morning. 09/09/16   Dorena Dew, FNP  glucose blood (TRUE METRIX BLOOD GLUCOSE TEST) test strip Use as instructed 09/10/16   Dorena Dew, FNP  ibuprofen (ADVIL,MOTRIN) 200 MG tablet Take 200 mg by mouth daily as needed for headache or moderate pain.    Historical Provider, MD  loratadine (CLARITIN) 10 MG tablet Take 1 tablet (10 mg total) by mouth daily. Patient taking differently: Take 10 mg by mouth daily as needed for allergies.  09/09/16   Dorena Dew, FNP  TRUEPLUS LANCETS 28G MISC USE AS INSTRUCTED 09/10/16   Dorena Dew, FNP     Positive ROS: All other systems have been reviewed and were otherwise negative with the exception of those mentioned in the HPI and as above.  Physical Exam: General: Alert, no acute distress Cardiovascular: No pedal edema Respiratory: No cyanosis, no use of accessory musculature GI: No organomegaly, abdomen is soft and non-tender Skin: No lesions in the area of chief complaint Neurologic: Sensation intact distally Psychiatric: Patient is competent for consent with normal mood and affect Lymphatic: No axillary or cervical lymphadenopathy  MUSCULOSKELETAL. Pain with extension and right lateral bending and rotation. Positive spurling sign to the right. Weakness left triceps 3/5 and right finger extension 4/5.  Pain into the right dorsal hand and middle finger.  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   Assessment: C6-7 central spinal stenosis with right C7 radiculopathy  Plan: Plan for Procedure(s): ANTERIOR CERVICAL DISCECTOMY FUSION C6-7 WITH EXCISION OF OSTEOPHYTE  The risks benefits and alternatives were discussed with the patient including but not limited to the risks of nonoperative treatment, versus surgical intervention including infection,  bleeding, nerve injury,  blood clots, cardiopulmonary complications, morbidity, mortality, among others, and they were willing to proceed.   [name], MD Cell [phone] Office [phone] 10/11/2016 1:15 PM

## 2016-10-11 NOTE — Discharge Instructions (Signed)
° ° °  No lifting greater than 10 lbs. No overhead use of arms. Avoid bending,and twisting neck. Walk in house for first week them may start to get out slowly increasing distance up to one quarter mile by 4 weeks post op. Keep incision dry for 3 days, may then bathe and wet incision using a [place] collar when showering. Call if any fevers >101, chills, or increasing numbness or weakness or increased swelling or drainage.

## 2016-10-11 NOTE — Anesthesia Preprocedure Evaluation (Addendum)
Anesthesia Evaluation  Patient identified by MRN, date of birth, ID band Patient awake    Reviewed: Allergy & Precautions, NPO status , Patient's Chart, lab work & pertinent test results  Airway Mallampati: II  TM Distance: >3 FB Neck ROM: Full    Dental  (+) Teeth Intact, Dental Advisory Given   Pulmonary neg pulmonary ROS,    breath sounds clear to auscultation       Cardiovascular hypertension, Pt. on medications  Rhythm:Regular Rate:Normal     Neuro/Psych negative neurological ROS  negative psych ROS   GI/Hepatic Neg liver ROS, GERD  ,  Endo/Other  diabetes, Type 2, Oral Hypoglycemic AgentsHyperthyroidism   Renal/GU negative Renal ROS  negative genitourinary   Musculoskeletal negative musculoskeletal ROS (+)   Abdominal   Peds negative pediatric ROS (+)  Hematology negative hematology ROS (+)   Anesthesia Other Findings   Reproductive/Obstetrics negative OB ROS                            Lab Results  Component Value Date   WBC 6.0 09/09/2016   HGB 12.9 09/09/2016   HCT 41.1 09/09/2016   MCV 77.4 (L) 09/09/2016   PLT 369 09/09/2016   Lab Results  Component Value Date   CREATININE 0.36 (L) 09/09/2016   BUN 10 09/09/2016   NA 140 09/09/2016   K 4.4 09/09/2016   CL 104 09/09/2016   CO2 27 09/09/2016   No results found for: INR, PROTIME  EKG: normal sinus rhythm.  Anesthesia Physical Anesthesia Plan  ASA: II  Anesthesia Plan: General   Post-op Pain Management:    Induction: Intravenous  Airway Management Planned: Oral ETT and Video Laryngoscope Planned  Additional Equipment:   Intra-op Plan:   Post-operative Plan: Extubation in OR  Informed Consent: I have reviewed the patients History and Physical, chart, labs and discussed the procedure including the risks, benefits and alternatives for the proposed anesthesia with the patient or authorized representative who  has indicated his/her understanding and acceptance.   Dental advisory given  Plan Discussed with: CRNA  Anesthesia Plan Comments:         Anesthesia Quick Evaluation

## 2016-10-12 LAB — GLUCOSE, CAPILLARY
Glucose-Capillary: 188 mg/dL — ABNORMAL HIGH (ref 65–99)
Glucose-Capillary: 202 mg/dL — ABNORMAL HIGH (ref 65–99)

## 2016-10-12 LAB — HEMOGLOBIN A1C
Hgb A1c MFr Bld: 8.9 % — ABNORMAL HIGH (ref 4.8–5.6)
Mean Plasma Glucose: 209 mg/dL

## 2016-10-12 MED ORDER — METHOCARBAMOL 500 MG PO TABS: 500.0000 mg | ORAL_TABLET | Freq: Four times a day (QID) | ORAL | 1 refills | Status: DC | PRN

## 2016-10-12 MED ORDER — HYDROCODONE-ACETAMINOPHEN 5-325 MG PO TABS: 1.0000 | ORAL_TABLET | ORAL | 0 refills | Status: DC | PRN

## 2016-10-12 NOTE — Discharge Summary (Signed)
Discharge Diagnoses:  Principal Problem:   Spinal stenosis of cervical region Active Problems:   Cervical spinal stenosis   Surgeries: Procedure(s): ANTERIOR CERVICAL DISCECTOMY FUSION C6-7 WITH EXCISION OF OSTEOPHYTE on 10/11/2016    Consultants:  none  Discharged Condition: Improved  Hospital Course: Miata Culbreth is an 56 y.o. female who was admitted 10/11/2016 with a chief complaint of cervical spine pain with radicular symptoms, with a final diagnosis of C6-7 central spinal stenosis with right C7 radiculopathy.  Patient was brought to the operating room on 10/11/2016 and underwent Procedure(s): ANTERIOR CERVICAL DISCECTOMY FUSION C6-7 WITH EXCISION OF OSTEOPHYTE.    Patient was given perioperative antibiotics: Anti-infectives    Start     Dose/Rate Route Frequency Ordered Stop   10/11/16 2130  ceFAZolin (ANCEF) IVPB 2g/100 mL premix     2 g 200 mL/hr over 30 Minutes Intravenous Every 8 hours 10/11/16 1754 10/12/16 0555   10/11/16 1106  ceFAZolin (ANCEF) 2-4 GM/100ML-% IVPB    Comments:  Laurita Quint   : cabinet override      10/11/16 1106 10/11/16 1359   10/11/16 1105  ceFAZolin (ANCEF) IVPB 2g/100 mL premix     2 g 200 mL/hr over 30 Minutes Intravenous On call to O.R. 10/11/16 1105 10/11/16 1359    .  Patient was given sequential compression devices, early ambulation, and aspirin for DVT prophylaxis.  Recent vital signs: Patient Vitals for the past 24 hrs:  BP Temp Temp src Pulse Resp SpO2 Height Weight  10/12/16 0537 (!) 143/58 98.7 F (37.1 C) Oral 81 18 97 % - -  10/12/16 0038 (!) 143/65 98.9 F (37.2 C) Oral 79 16 99 % - -  10/11/16 2144 (!) 145/56 98.6 F (37 C) Oral 78 18 100 % - -  10/11/16 1821 140/61 98.4 F (36.9 C) Oral 74 16 97 % - -  10/11/16 1733 (!) 141/66 - - 81 13 94 % - -  10/11/16 1720 (!) 149/79 - - 80 16 94 % - -  10/11/16 1648 (!) 154/81 - - 89 17 93 % - -  10/11/16 1633 140/87 - - 96 13 99 % - -  10/11/16 1632 - 97 F (36.1 C) - - - - - -   10/11/16 1133 (!) 138/59 98.9 F (37.2 C) Oral 82 18 100 % - -  10/11/16 1132 (!) 138/59 - - - - - '5\' 5"'$  (1.651 m) 170 lb (77.1 kg)  .  Recent laboratory studies: Dg Cervical Spine 2-3 Views  Result Date: 10/11/2016 CLINICAL DATA:  C6-7 ACDF.  Intraoperative imaging. EXAM: DG C-ARM 61-120 MIN; CERVICAL SPINE - 2-3 VIEW COMPARISON:  MRI cervical spine 09/06/2016. FINDINGS: We are provided with 4 fluoroscopic intraoperative spot views of the cervical spine in the lateral projection. Images demonstrate localization of C6-7 and subsequent placement of anterior plate and screws and interbody spacer. No acute abnormality is seen. IMPRESSION: C6-7 ACDF. Electronically Signed   By: Inge Rise M.D.   On: 10/11/2016 19:48   Dg C-arm 1-60 Min  Result Date: 10/11/2016 CLINICAL DATA:  C6-7 ACDF.  Intraoperative imaging. EXAM: DG C-ARM 61-120 MIN; CERVICAL SPINE - 2-3 VIEW COMPARISON:  MRI cervical spine 09/06/2016. FINDINGS: We are provided with 4 fluoroscopic intraoperative spot views of the cervical spine in the lateral projection. Images demonstrate localization of C6-7 and subsequent placement of anterior plate and screws and interbody spacer. No acute abnormality is seen. IMPRESSION: C6-7 ACDF. Electronically Signed   By: Inge Rise M.D.  On: 10/11/2016 19:48    Discharge Medications:   Allergies as of 10/12/2016      Reactions   Aspirin Nausea And Vomiting   Tramadol    Upset stomach       Medication List    STOP taking these medications   ibuprofen 200 MG tablet Commonly known as:  ADVIL,MOTRIN     TAKE these medications   acetaminophen 500 MG tablet Commonly known as:  TYLENOL Take 1,000 mg by mouth daily as needed for moderate pain.   amLODipine 10 MG tablet Commonly known as:  NORVASC Take 1 tablet (10 mg total) by mouth daily.   glipiZIDE 10 MG tablet Commonly known as:  GLUCOTROL Take 1 tablet (10 mg total) by mouth 2 (two) times daily before a meal.   glucose  blood test strip Commonly known as:  TRUE METRIX BLOOD GLUCOSE TEST Use as instructed   HYDROcodone-acetaminophen 5-325 MG tablet Commonly known as:  NORCO/VICODIN Take 1-2 tablets by mouth every 4 (four) hours as needed (mild pain).   loratadine 10 MG tablet Commonly known as:  CLARITIN Take 1 tablet (10 mg total) by mouth daily. What changed:  when to take this  reasons to take this   metFORMIN 1000 MG tablet Commonly known as:  GLUCOPHAGE Take 1 tablet (1,000 mg total) by mouth 2 (two) times daily with a meal.   methimazole 5 MG tablet Commonly known as:  TAPAZOLE Take 1 tablet (5 mg total) by mouth 2 (two) times daily.   methocarbamol 500 MG tablet Commonly known as:  ROBAXIN Take 1 tablet (500 mg total) by mouth every 6 (six) hours as needed for muscle spasms.   TRUE METRIX AIR GLUCOSE METER w/Device Kit 1 each by Does not apply route every morning.   TRUEPLUS LANCETS 28G Misc USE AS INSTRUCTED       Diagnostic Studies: Dg Cervical Spine 2-3 Views  Result Date: 10/11/2016 CLINICAL DATA:  C6-7 ACDF.  Intraoperative imaging. EXAM: DG C-ARM 61-120 MIN; CERVICAL SPINE - 2-3 VIEW COMPARISON:  MRI cervical spine 09/06/2016. FINDINGS: We are provided with 4 fluoroscopic intraoperative spot views of the cervical spine in the lateral projection. Images demonstrate localization of C6-7 and subsequent placement of anterior plate and screws and interbody spacer. No acute abnormality is seen. IMPRESSION: C6-7 ACDF. Electronically Signed   By: Inge Rise M.D.   On: 10/11/2016 19:48   Dg C-arm 1-60 Min  Result Date: 10/11/2016 CLINICAL DATA:  C6-7 ACDF.  Intraoperative imaging. EXAM: DG C-ARM 61-120 MIN; CERVICAL SPINE - 2-3 VIEW COMPARISON:  MRI cervical spine 09/06/2016. FINDINGS: We are provided with 4 fluoroscopic intraoperative spot views of the cervical spine in the lateral projection. Images demonstrate localization of C6-7 and subsequent placement of anterior plate and  screws and interbody spacer. No acute abnormality is seen. IMPRESSION: C6-7 ACDF. Electronically Signed   By: Inge Rise M.D.   On: 10/11/2016 19:48    Patient benefited maximally from their hospital stay and there were no complications.     Disposition: 01-Home or Self Care Discharge Instructions    Call MD / Call 911    Complete by:  As directed    If you experience chest pain or shortness of breath, CALL 911 and be transported to the hospital emergency room.  If you develope a fever above 101 F, pus (white drainage) or increased drainage or redness at the wound, or calf pain, call your surgeon's office.   Call MD / Call 911  Complete by:  As directed    If you experience chest pain or shortness of breath, CALL 911 and be transported to the hospital emergency room.  If you develope a fever above 101 F, pus (white drainage) or increased drainage or redness at the wound, or calf pain, call your surgeon's office.   Constipation Prevention    Complete by:  As directed    Drink plenty of fluids.  Prune juice may be helpful.  You may use a stool softener, such as Colace (over the counter) 100 mg twice a day.  Use MiraLax (over the counter) for constipation as needed.   Constipation Prevention    Complete by:  As directed    Drink plenty of fluids.  Prune juice may be helpful.  You may use a stool softener, such as Colace (over the counter) 100 mg twice a day.  Use MiraLax (over the counter) for constipation as needed.   Diet - low sodium heart healthy    Complete by:  As directed    Diet - low sodium heart healthy    Complete by:  As directed    Discharge instructions    Complete by:  As directed    No lifting greater than 10 lbs. No overhead use of arms. Avoid bending,and twisting neck. Walk in house for first week them may start to get out slowly increasing distance up to one quarter mile by 4 weeks post op. Keep incision dry for 3 days, may then bathe and wet incision using a  [place] collar when showering. Call if any fevers >101, chills, or increasing numbness or weakness or increased swelling or drainage.   Driving restrictions    Complete by:  As directed    No driving for 2 weeks   Increase activity slowly as tolerated    Complete by:  As directed    Increase activity slowly as tolerated    Complete by:  As directed    Lifting restrictions    Complete by:  As directed    No lifting for 8 weeks     Follow-up Information    [name], MD Follow up in 2 week(s).   Specialty:  Orthopedic Surgery Why:  For wound re-check Contact information: [place] [address] [phone]            Signed: Newt Minion 10/12/2016, 11:10 AM

## 2016-10-12 NOTE — Progress Notes (Signed)
10/12/16 1312 nursing Patient discharged to home per wheelchair accompanied by NT and husband. Discharge instructions given all questions answered. Prescription given to patient.

## 2016-10-12 NOTE — Evaluation (Signed)
Occupational Therapy Evaluation and Discharge Patient Details Name: Crystal [name] MRN: [id number] DOB: Mar 29, 1961 Today's Date: 10/12/2016    History of Present Illness Pt is a 56 y/o female who presents s/p C6-C7 ACDF on 10/11/16.   Clinical Impression   Pt reports she was independent with ADL PTA. Currently pt overall supervision for safety with ADL and functional mobility. All cervical, safety, and ADL education completed with pt and family. Pt planning to d/c home with 24/7 supervision from family. No further acute OT needs identified; signing off at this time. Please re-consult if needs change. Thank you for this referral.    Follow Up Recommendations  No OT follow up;Supervision - Intermittent    Equipment Recommendations  None recommended by OT    Recommendations for Other Services       Precautions / Restrictions Precautions Precautions: Fall;Cervical Precaution Comments: Educated pt and son on cervical precautions. Required Braces or Orthoses: Cervical Brace Cervical Brace: Soft collar ([place]illy collar for shower) Restrictions Weight Bearing Restrictions: No      Mobility Bed Mobility Overal bed mobility: Needs Assistance Bed Mobility: Rolling;Sidelying to Sit;Sit to Sidelying Rolling: Supervision Sidelying to sit: Min guard     Sit to sidelying: Min guard General bed mobility comments: Educated pt on log roll technqiue for bed mobility. HOB flat without use of bed rails.  Transfers Overall transfer level: Needs assistance Equipment used: None Transfers: Sit to/from Stand Sit to Stand: Supervision         General transfer comment: Supervision for safety, no physical assist required.    Balance Overall balance assessment: Needs assistance Sitting-balance support: No upper extremity supported;Feet supported Sitting balance-Leahy Scale: Good     Standing balance support: No upper extremity supported;During functional activity Standing balance-Leahy  Scale: Good                              ADL Overall ADL's : Needs assistance/impaired Eating/Feeding: Independent;Sitting   Grooming: Supervision/safety;Standing Grooming Details (indicate cue type and reason): Educated on use of 2 cups for oral care Upper Body Bathing: Set up;Sitting   Lower Body Bathing: Supervison/ safety;Sit to/from stand   Upper Body Dressing : Set up;Sitting Upper Body Dressing Details (indicate cue type and reason): Educated pt on donning/doffing soft collar and philly collar Lower Body Dressing: Supervision/safety;Sit to/from stand Lower Body Dressing Details (indicate cue type and reason): Pt able to cross foot over opposite knee to don/doff sock Toilet Transfer: Supervision/safety;Ambulation Toilet Transfer Details (indicate cue type and reason): Simulated by sit to stand from chair with functional mobility in room     Tub/ Shower Transfer: Supervision/safety;Tub transfer;Ambulation Tub/Shower Transfer Details (indicate cue type and reason): Simulated Functional mobility during ADLs: Supervision/safety General ADL Comments: Educated pt on maintaining cervical precautions during functional activities, log roll technique for bed mobility, frequent mobility upon return home.     Vision Vision Assessment?: No apparent visual deficits   Perception     Praxis      Pertinent Vitals/Pain Pain Assessment: Faces Pain Score: 8  Faces Pain Scale: Hurts even more Pain Location: back Pain Descriptors / Indicators: Aching;Sore Pain Intervention(s): Monitored during session     Hand Dominance Right   Extremity/Trunk Assessment Upper Extremity Assessment Upper Extremity Assessment: Overall WFL for tasks assessed   Lower Extremity Assessment Lower Extremity Assessment: Defer to PT evaluation   Cervical / Trunk Assessment Cervical / Trunk Assessment: Other exceptions Cervical / Trunk Exceptions: s/p  cervical surgery   Communication  Communication Communication: No difficulties   Cognition Arousal/Alertness: Awake/alert Behavior During Therapy: WFL for tasks assessed/performed Overall Cognitive Status: Within Functional Limits for tasks assessed                     General Comments       Exercises       Shoulder Instructions      Home Living Family/patient expects to be discharged to:: Private residence Living Arrangements: Spouse/significant other;Children Available Help at Discharge: Family;Available 24 hours/day Type of Home: House Home Access: Stairs to enter [employer] of Steps: 3 Entrance Stairs-Rails: None Home Layout: Two level;Able to live on main level with bedroom/bathroom     Bathroom Shower/Tub: Teacher, early years/pre: Standard Bathroom Accessibility: Yes   Home Equipment: None          Prior Functioning/Environment Level of Independence: Independent                 OT Problem List:     OT Treatment/Interventions:      OT Goals(Current goals can be found in the care plan section) Acute Rehab OT Goals Patient Stated Goal: Home today OT Goal Formulation: All assessment and education complete, DC therapy  OT Frequency:     Barriers to D/C:            Co-evaluation              End of Session Equipment Utilized During Treatment: Cervical collar Nurse Communication: Mobility status  Activity Tolerance: Patient tolerated treatment well Patient left: in chair;with call bell/phone within reach;with family/visitor present   Time: [id number] OT Time Calculation (min): 16 min Charges:  OT General Charges $OT Visit: 1 Procedure OT Evaluation $OT Eval Moderate Complexity: 1 Procedure G-Codes: OT G-codes **NOT FOR INPATIENT CLASS** Functional Assessment Tool Used: Clinical judgement Functional Limitation: Self care Self Care Current Status [id number]): At least 1 percent but less than 20 percent impaired, limited or restricted Self Care  Goal Status [id number]): At least 1 percent but less than 20 percent impaired, limited or restricted Self Care Discharge Status [phone]): At least 1 percent but less than 20 percent impaired, limited or restricted   Binnie Kand M.S., OTR/L Pager: [phone]  10/12/2016, 1:24 PM

## 2016-10-12 NOTE — Evaluation (Signed)
Physical Therapy Evaluation Patient Details Name: Crystal [name] MRN: [id number] DOB: [birth year] Today's Date: 10/12/2016   History of Present Illness  Pt is a 56 y/o female who presents s/p C6-C7 ACDF on 10/11/16.  Clinical Impression  Pt admitted with above diagnosis. Pt currently with functional limitations due to the deficits listed below (see PT Problem List). At the time of PT eval pt was able to perform transfers and ambulation with gross min guard assist for balance support and safety. Pt was educated on cervical precautions, walking program, and general safety with mobility.  Pt will benefit from skilled PT to increase their independence and safety with mobility to allow discharge to the venue listed below.       Follow Up Recommendations Outpatient PT    Equipment Recommendations  3in1 (PT)    Recommendations for Other Services       Precautions / Restrictions Precautions Precautions: Fall;Cervical Precaution Comments: Reviewed verbally Required Braces or Orthoses: Cervical Brace Cervical Brace: Soft collar ([place] collar present in room) Restrictions Weight Bearing Restrictions: No      Mobility  Bed Mobility Overal bed mobility: Needs Assistance Bed Mobility: Rolling;Sidelying to Sit Rolling: Min assist Sidelying to sit: Min assist       General bed mobility comments: Assist for full roll and elevation of trunk to full sitting position. VC's for maintenance of precautions.  Transfers Overall transfer level: Needs assistance   Transfers: Sit to/from Stand Sit to Stand: Min guard         General transfer comment: Close guard for safety. No physical assist required.  Ambulation/Gait Ambulation/Gait assistance: Min guard Ambulation Distance (Feet): 200 Feet Assistive device: None Gait Pattern/deviations: Step-through pattern;Decreased stride length;Trunk flexed Gait velocity: Decreased Gait velocity interpretation: Below normal speed for age/gender General  Gait Details: Pt occasionally unsteady and benefited from hands on guarding for safety.  Stairs Stairs: Yes Stairs assistance: Min assist Stair Management: No rails;Step to pattern;Forwards Number of Stairs: 3 General stair comments: Son present for education on safe stair negotiation.   Wheelchair Mobility    Modified Rankin (Stroke Patients Only)       Balance Overall balance assessment: Needs assistance Sitting-balance support: Feet supported;No upper extremity supported Sitting balance-Leahy Scale: Good     Standing balance support: No upper extremity supported;During functional activity Standing balance-Leahy Scale: Fair                               Pertinent Vitals/Pain Pain Assessment: 0-10 Pain Score: 8  Pain Location: Neck/back pain Pain Descriptors / Indicators: Operative site guarding;Discomfort Pain Intervention(s): Limited activity within patient's tolerance;Monitored during session;Repositioned    Home Living Family/patient expects to be discharged to:: Private residence Living Arrangements: Spouse/significant other;Children Available Help at Discharge: Family;Available 24 hours/day Type of Home: House Home Access: Stairs to enter Entrance Stairs-Rails: None Entrance Stairs-Number of Steps: 3 Home Layout: Two level;Able to live on main level with bedroom/bathroom Home Equipment: None      Prior Function Level of Independence: Independent               Hand Dominance   Dominant Hand: Right    Extremity/Trunk Assessment   Upper Extremity Assessment Upper Extremity Assessment: Defer to OT evaluation    Lower Extremity Assessment Lower Extremity Assessment: Overall WFL for tasks assessed    Cervical / Trunk Assessment Cervical / Trunk Assessment: Other exceptions Cervical / Trunk Exceptions: s/p cervical surgery  Communication  Communication: No difficulties  Cognition Arousal/Alertness: Awake/alert Behavior During  Therapy: WFL for tasks assessed/performed Overall Cognitive Status: Within Functional Limits for tasks assessed                      General Comments      Exercises     Assessment/Plan    PT Assessment Patient needs continued PT services  PT Problem List Decreased strength;Decreased range of motion;Decreased activity tolerance;Decreased balance;Decreased mobility;Decreased knowledge of use of DME;Decreased safety awareness;Decreased knowledge of precautions;Pain          PT Treatment Interventions DME instruction;Stair training;Gait training;Functional mobility training;Therapeutic activities;Therapeutic exercise;Neuromuscular re-education;Patient/family education    PT Goals (Current goals can be found in the Care Plan section)  Acute Rehab PT Goals Patient Stated Goal: Home today PT Goal Formulation: With patient/family Time For Goal Achievement: 10/19/16 Potential to Achieve Goals: Good    Frequency Min 5X/week   Barriers to discharge        Co-evaluation               End of Session Equipment Utilized During Treatment: Gait belt;Cervical collar Activity Tolerance: Patient tolerated treatment well Patient left: in chair;with call bell/phone within reach;with family/visitor present Nurse Communication: Mobility status    Functional Assessment Tool Used: Clinical judgement Functional Limitation: Mobility: Walking and moving around Mobility: Walking and Moving Around Current Status [id number]): At least 20 percent but less than 40 percent impaired, limited or restricted Mobility: Walking and Moving Around Goal Status [phone]): At least 1 percent but less than 20 percent impaired, limited or restricted    Time: 0810-0845 PT Time Calculation (min) (ACUTE ONLY): 35 min   Charges:   PT Evaluation $PT Eval Moderate Complexity: 1 Procedure PT Treatments $Gait Training: 8-22 mins   PT G Codes:   PT G-Codes **NOT FOR INPATIENT CLASS** Functional Assessment  Tool Used: Clinical judgement Functional Limitation: Mobility: Walking and moving around Mobility: Walking and Moving Around Current Status [id number]): At least 20 percent but less than 40 percent impaired, limited or restricted Mobility: Walking and Moving Around Goal Status [phone]): At least 1 percent but less than 20 percent impaired, limited or restricted    Thelma Comp 10/12/2016, 12:58 PM   Rolinda Roan, PT, DPT Acute Rehabilitation Services Pager: [phone]

## 2016-10-14 ENCOUNTER — Ambulatory Visit: Payer: Self-pay | Admitting: Family Medicine

## 2016-10-15 ENCOUNTER — Telehealth (INDEPENDENT_AMBULATORY_CARE_PROVIDER_SITE_OTHER): Payer: Self-pay | Admitting: Specialist

## 2016-10-15 NOTE — Telephone Encounter (Signed)
Pt needs follow up appt from surgery on the 5th with nitka. Please advise. [phone]

## 2016-10-16 ENCOUNTER — Encounter (HOSPITAL_COMMUNITY): Payer: Self-pay | Admitting: Specialist

## 2016-10-16 MED FILL — metFORMIN HCL 1000 MG TABS: 1000 | 30 days supply | Qty: 60 | Fill #1

## 2016-10-16 MED FILL — glipiZIDE 10 MG TABS: 10 | 30 days supply | Qty: 60 | Fill #1

## 2016-10-18 NOTE — Telephone Encounter (Signed)
Patient is scheduled for 10/24/2016.

## 2016-10-22 ENCOUNTER — Encounter (HOSPITAL_COMMUNITY): Payer: Self-pay | Admitting: Specialist

## 2016-10-24 ENCOUNTER — Inpatient Hospital Stay (INDEPENDENT_AMBULATORY_CARE_PROVIDER_SITE_OTHER): Payer: MEDICAID | Admitting: Specialist

## 2016-10-26 ENCOUNTER — Ambulatory Visit (INDEPENDENT_AMBULATORY_CARE_PROVIDER_SITE_OTHER): Payer: Self-pay

## 2016-10-26 ENCOUNTER — Encounter (INDEPENDENT_AMBULATORY_CARE_PROVIDER_SITE_OTHER): Payer: Self-pay | Admitting: Specialist

## 2016-10-26 ENCOUNTER — Ambulatory Visit (INDEPENDENT_AMBULATORY_CARE_PROVIDER_SITE_OTHER): Payer: Self-pay | Admitting: Specialist

## 2016-10-26 VITALS — BP 167/87 | HR 87 | Ht 61.0 in | Wt 175.0 lb

## 2016-10-26 DIAGNOSIS — M4802 Spinal stenosis, cervical region: Secondary | ICD-10-CM

## 2016-10-26 DIAGNOSIS — Z981 Arthrodesis status: Secondary | ICD-10-CM

## 2016-10-26 MED ORDER — MEDERMA EX GEL: Freq: Once | CUTANEOUS | Status: AC

## 2016-10-26 MED ORDER — HYDROCODONE-ACETAMINOPHEN 5-325 MG PO TABS: 1.0000 | ORAL_TABLET | ORAL | 0 refills | Status: DC | PRN

## 2016-10-26 NOTE — Addendum Note (Signed)
Addended by: Basil Dess on: 10/26/2016 01:36 PM   Modules accepted: Orders

## 2016-10-26 NOTE — Patient Instructions (Signed)
    No lifting greater than 10 lbs. No overhead use of arms. Avoid bending,and twisting neck. Walk in house for first week them may start to get out slowly increasing distance up to one mile by 3 weeks post op. Call if any fevers >101, chills, or increasing numbness or weakness or increased swelling or drainage.

## 2016-10-26 NOTE — Progress Notes (Deleted)
   Office Visit Note   Patient: Crystal [name]           Date of Birth: 03-23-1961           MRN: [id number] Visit Date: 10/26/2016              Requested by: Dorena Dew, FNP [address], [place] [address] PCP: Dorena Dew, FNP   Assessment & Plan: Visit Diagnoses:  1. Spinal stenosis of cervical region     Plan: ***  Follow-Up Instructions: No Follow-up on file.   Orders:  Orders Placed This Encounter  Procedures  . XR Cervical Spine 2 or 3 views   No orders of the defined types were placed in this encounter.     Procedures: No procedures performed   Clinical Data: No additional findings.   Subjective: Chief Complaint  Patient presents with  . Neck - Routine Post Op    HPI  Review of Systems   Objective: Vital Signs: BP (!) 167/87 (BP Location: Left Arm, Patient Position: Sitting)   Pulse 87   Ht 5\' 1"  (1.549 m)   Wt 175 lb (79.4 kg)   LMP 06/02/2013   BMI 33.07 kg/m   Physical Exam  Ortho Exam  Specialty Comments:  No specialty comments available.  Imaging: No results found.   PMFS History: Patient Active Problem List   Diagnosis Date Noted  . Spinal stenosis of cervical region 10/11/2016    Class: Chronic  . Cervical spinal stenosis 10/11/2016  . Hypertension 09/09/2016  . Diabetes mellitus ([place]) 09/09/2016  . Multiple thyroid nodules 01/27/2013  . Disorder of thyroid 07/07/2007  . VARICOSE VEIN 07/07/2007  . GERD 07/07/2007   Past Medical History:  Diagnosis Date  . Diabetes mellitus   . Hypertension   . Hyperthyroidism     No family history on file.  Past Surgical History:  Procedure Laterality Date  . ANTERIOR CERVICAL DECOMP/DISCECTOMY FUSION N/A 10/11/2016   Procedure: ANTERIOR CERVICAL DISCECTOMY FUSION C6-7 WITH EXCISION OF OSTEOPHYTE;  Surgeon: [name], MD;  Location: [facility];  Service: Orthopedics;  Laterality: N/A;  . CESAREAN SECTION    . TUBAL LIGATION     Social History    Occupational History  . Not on file.   Social History Main Topics  . Smoking status: Never Smoker  . Smokeless tobacco: Never Used  . Alcohol use No  . Drug use: No  . Sexual activity: Not on file

## 2016-10-26 NOTE — Progress Notes (Signed)
   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  Chief Complaint:  Crystal [name] is 2 weeks and 1 day post Anterior cervcial discectomy fusion C6-7 with excision of osteophyte.  She states that she is doing good, but she is still having some pain along the right side of her upper back.     Visit Diagnoses:  1. Spinal stenosis of cervical region     Plan: No lifting greater than 10 lbs. No overhead use of arms. Avoid bending,and twisting neck. Walk in house for first week them may start to get out slowly increasing distance up to one mile by 3 weeks post op. Call if any fevers >101, chills, or increasing numbness or weakness or increased swelling or drainage.   Follow-Up Instructions: No Follow-up on file.   Orders:  Orders Placed This Encounter  Procedures  . XR Cervical Spine 2 or 3 views   No orders of the defined types were placed in this encounter.   Imaging: No results found.  PMFS History: Patient Active Problem List   Diagnosis Date Noted  . Spinal stenosis of cervical region 10/11/2016    Class: Chronic  . Cervical spinal stenosis 10/11/2016  . Hypertension 09/09/2016  . Diabetes mellitus ([place]) 09/09/2016  . Multiple thyroid nodules 01/27/2013  . Disorder of thyroid 07/07/2007  . VARICOSE VEIN 07/07/2007  . GERD 07/07/2007   Past Medical History:  Diagnosis Date  . Diabetes mellitus   . Hypertension   . Hyperthyroidism     No family history on file.  Past Surgical History:  Procedure Laterality Date  .  ANTERIOR CERVICAL DECOMP/DISCECTOMY FUSION N/A 10/11/2016   Procedure: ANTERIOR CERVICAL DISCECTOMY FUSION C6-7 WITH EXCISION OF OSTEOPHYTE;  Surgeon: [name], MD;  Location: [facility];  Service: Orthopedics;  Laterality: N/A;  . CESAREAN SECTION    . TUBAL LIGATION     Social History   Occupational History  . Not on file.   Social History Main Topics  . Smoking status: Never Smoker  . Smokeless tobacco: Never Used  . Alcohol use No  . Drug use: No  . Sexual activity: Not on file

## 2016-10-29 ENCOUNTER — Ambulatory Visit: Payer: Self-pay | Admitting: Family Medicine

## 2016-10-30 ENCOUNTER — Emergency Department (HOSPITAL_COMMUNITY)
Admission: EM | Admit: 2016-10-30 | Discharge: 2016-10-30 | Disposition: A | Discharge status: Home / Self Care | Payer: No Typology Code available for payment source | Attending: Emergency Medicine | Admitting: Emergency Medicine

## 2016-10-30 ENCOUNTER — Encounter (HOSPITAL_COMMUNITY): Payer: Self-pay | Admitting: Emergency Medicine

## 2016-10-30 DIAGNOSIS — Z7984 Long term (current) use of oral hypoglycemic drugs: Secondary | ICD-10-CM | POA: Insufficient documentation

## 2016-10-30 DIAGNOSIS — E119 Type 2 diabetes mellitus without complications: Secondary | ICD-10-CM | POA: Insufficient documentation

## 2016-10-30 DIAGNOSIS — Z79899 Other long term (current) drug therapy: Secondary | ICD-10-CM | POA: Insufficient documentation

## 2016-10-30 DIAGNOSIS — I10 Essential (primary) hypertension: Secondary | ICD-10-CM | POA: Insufficient documentation

## 2016-10-30 DIAGNOSIS — R05 Cough: Secondary | ICD-10-CM | POA: Insufficient documentation

## 2016-10-30 DIAGNOSIS — E039 Hypothyroidism, unspecified: Secondary | ICD-10-CM | POA: Insufficient documentation

## 2016-10-30 DIAGNOSIS — R6889 Other general symptoms and signs: Secondary | ICD-10-CM

## 2016-10-30 MED ORDER — IBUPROFEN 800 MG PO TABS: 800.0000 mg | ORAL_TABLET | Freq: Three times a day (TID) | ORAL | 0 refills | Status: DC

## 2016-10-30 MED ORDER — BENZONATATE 100 MG PO CAPS: 100.0000 mg | ORAL_CAPSULE | Freq: Three times a day (TID) | ORAL | 0 refills | Status: DC

## 2016-10-30 MED ORDER — ONDANSETRON 4 MG PO TBDP: 4.0000 mg | ORAL_TABLET | Freq: Three times a day (TID) | ORAL | 0 refills | Status: DC | PRN

## 2016-10-30 MED FILL — BENZONATATE 100 MG CAPSULE: 100 | 7 days supply | Qty: 21 | Fill #0

## 2016-10-30 MED FILL — IBUPROFEN 800 MG TABLET: 800 | 7 days supply | Qty: 21 | Fill #0

## 2016-10-30 MED FILL — ONDANSETRON ODT 4 MG TABLET: 4 | 3 days supply | Qty: 10 | Fill #0

## 2016-10-30 NOTE — ED Triage Notes (Signed)
Pt states "I think I have the flu for four days, ive been taking theraflu and tylenol, cough, running nose, sinus pressure, I haven't slept for three days."

## 2016-10-30 NOTE — ED Provider Notes (Signed)
[facility] DEPT Provider Note   CSN: [id number] Arrival date & time: 10/30/16  5027  By signing my name below, I, Jeanell Sparrow, attest that this documentation has been prepared under the direction and in the presence of non-physician practitioner, Quincy Carnes, PA-C. Electronically Signed: Jeanell Sparrow, Scribe. 10/30/2016. 9:48 AM.  History   Chief Complaint Chief Complaint  Patient presents with  . Cough   The history is provided by the patient. No language interpreter was used.   HPI Comments: Crystal [name] is a 56 y.o. female who presents to the Emergency Department complaining of intermittent moderate cough that started a few days ago. She states she has been having gradually worsening URI-like symptoms. She describes the cough as dry. She reports associated symptoms of headache, decreased appetite, nasal congestion, body aches, and subjective fever. Pt's temperature in the ED today was 99.5. She has been taking tylenol for fever with temporary relief. She admits to sick contacts at home-- multiple family members with flu. She denies any influenza vaccination this year.  No vomiting, diarrhea, abdominal pain, chest pain, SOB, or other complaints.   PCP: Dorena Dew, FNP  Past Medical History:  Diagnosis Date  . Diabetes mellitus   . Hypertension   . Hyperthyroidism     Patient Active Problem List   Diagnosis Date Noted  . Spinal stenosis of cervical region 10/11/2016    Class: Chronic  . Cervical spinal stenosis 10/11/2016  . Hypertension 09/09/2016  . Diabetes mellitus ([place]) 09/09/2016  . Multiple thyroid nodules 01/27/2013  . Disorder of thyroid 07/07/2007  . VARICOSE VEIN 07/07/2007  . GERD 07/07/2007    Past Surgical History:  Procedure Laterality Date  . ANTERIOR CERVICAL DECOMP/DISCECTOMY FUSION N/A 10/11/2016   Procedure: ANTERIOR CERVICAL DISCECTOMY FUSION C6-7 WITH EXCISION OF OSTEOPHYTE;  Surgeon: [name], MD;  Location: [facility];  Service:  Orthopedics;  Laterality: N/A;  . CESAREAN SECTION    . TUBAL LIGATION      OB History    No data available       Home Medications    Prior to Admission medications   Medication Sig Start Date End Date Taking? Authorizing Provider  acetaminophen (TYLENOL) 500 MG tablet Take 1,000 mg by mouth daily as needed for moderate pain.     Historical Provider, MD  amLODipine (NORVASC) 10 MG tablet Take 1 tablet (10 mg total) by mouth daily. 09/09/16   Dorena Dew, FNP  Blood Glucose Monitoring Suppl (TRUE METRIX AIR GLUCOSE METER) w/Device KIT 1 each by Does not apply route every morning. 09/09/16   Dorena Dew, FNP  glipiZIDE (GLUCOTROL) 10 MG tablet Take 1 tablet (10 mg total) by mouth 2 (two) times daily before a meal. 09/09/16   Dorena Dew, FNP  glucose blood (TRUE METRIX BLOOD GLUCOSE TEST) test strip Use as instructed 09/10/16   Dorena Dew, FNP  HYDROcodone-acetaminophen (NORCO/VICODIN) 5-325 MG tablet Take 1 tablet by mouth every 4 (four) hours as needed for moderate pain (mild pain). 10/26/16   [name], MD  loratadine (CLARITIN) 10 MG tablet Take 1 tablet (10 mg total) by mouth daily. Patient taking differently: Take 10 mg by mouth daily as needed for allergies.  09/09/16   Dorena Dew, FNP  metFORMIN (GLUCOPHAGE) 1000 MG tablet Take 1 tablet (1,000 mg total) by mouth 2 (two) times daily with a meal. 09/09/16   Dorena Dew, FNP  methimazole (TAPAZOLE) 5 MG tablet Take 1 tablet (5 mg total)  by mouth 2 (two) times daily. 09/10/16   Dorena Dew, FNP  methocarbamol (ROBAXIN) 500 MG tablet Take 1 tablet (500 mg total) by mouth every 6 (six) hours as needed for muscle spasms. 10/12/16   Newt Minion, MD  TRUEPLUS LANCETS 28G MISC USE AS INSTRUCTED 09/10/16   Dorena Dew, FNP    Family History No family history on file.  Social History Social History  Substance Use Topics  . Smoking status: Never Smoker  . Smokeless tobacco: Never Used  . Alcohol use  No     Allergies   Aspirin and Tramadol   Review of Systems Review of Systems  Constitutional: Positive for appetite change and fever (Subjective).  Respiratory: Positive for cough.   Gastrointestinal: Negative for vomiting.  Musculoskeletal: Positive for myalgias (Generalized).  Neurological: Positive for headaches.  All other systems reviewed and are negative.    Physical Exam Updated Vital Signs BP 155/68 (BP Location: Right Arm)   Pulse 97   Temp 99.5 F (37.5 C) (Oral)   Resp 20   Ht '5\' 1"'$  (1.549 m)   Wt 180 lb (81.6 kg)   LMP 06/02/2013   SpO2 98%   BMI 34.01 kg/m   Physical Exam  Constitutional: She is oriented to person, place, and time. She appears well-developed and well-nourished.  HENT:  Head: Normocephalic and atraumatic.  Right Ear: Tympanic membrane and ear canal normal.  Left Ear: Tympanic membrane and ear canal normal.  Nose: Mucosal edema and rhinorrhea (clear) present.  Mouth/Throat: Uvula is midline, oropharynx is clear and moist and mucous membranes are normal.  Eyes: Conjunctivae and EOM are normal. Pupils are equal, round, and reactive to light.  Neck: Normal range of motion.  Cardiovascular: Normal rate, regular rhythm and normal heart sounds.   Pulmonary/Chest: Effort normal and breath sounds normal. No respiratory distress. She has no wheezes. She has no rhonchi.  Lungs clear, no wheezes or rhonchi, no distress  Abdominal: Soft. Bowel sounds are normal. There is no tenderness. There is no rigidity and no guarding.  Musculoskeletal: Normal range of motion.  Neurological: She is alert and oriented to person, place, and time.  Skin: Skin is warm and dry.  Psychiatric: She has a normal mood and affect.  Nursing note and vitals reviewed.    ED Treatments / Results  DIAGNOSTIC STUDIES: Oxygen Saturation is 98% on RA, normal by my interpretation.    COORDINATION OF CARE: 9:52 AM- Pt advised of plan for treatment and pt  agrees.  Labs (all labs ordered are listed, but only abnormal results are displayed) Labs Reviewed - No data to display  EKG  EKG Interpretation None       Radiology No results found.  Procedures Procedures (including critical care time)  Medications Ordered in ED Medications - No data to display   Initial Impression / Assessment and Plan / ED Course  I have reviewed the triage vital signs and the nursing notes.  Pertinent labs & imaging results that were available during my care of the patient were reviewed by me and considered in my medical decision making (see chart for details).  56 year old female here with flulike symptoms for the past 4 days. She did not receive a flu vaccination this year. Multiple family members sick with flu as well. She is afebrile and nontoxic. Exam is overall benign aside from some nasal congestion and clear rhinorrhea. Lungs are clear without wheezes or rhonchi to suggest pneumonia. Abdomen is soft and benign.  No chest pain or shortness of breath. Patient with possible flu, however out of the treatment window for Tamiflu. Will aim for symptomatic care. I have encouraged rest and fluids at home. Close follow-up with PCP.  Discussed plan with patient, she acknowledged understanding and agreed with plan of care.  Return precautions given for new or worsening symptoms.  Final Clinical Impressions(s) / ED Diagnoses   Final diagnoses:  Flu-like symptoms    New Prescriptions New Prescriptions   BENZONATATE (TESSALON) 100 MG CAPSULE    Take 1 capsule (100 mg total) by mouth every 8 (eight) hours.   IBUPROFEN (ADVIL,MOTRIN) 800 MG TABLET    Take 1 tablet (800 mg total) by mouth 3 (three) times daily.   ONDANSETRON (ZOFRAN ODT) 4 MG DISINTEGRATING TABLET    Take 1 tablet (4 mg total) by mouth every 8 (eight) hours as needed for nausea.   I personally performed the services described in this documentation, which was scribed in my presence. The recorded  information has been reviewed and is accurate.    Larene Pickett, PA-C 10/30/16 [place], MD 10/30/16 [phone]

## 2016-10-30 NOTE — ED Notes (Signed)
Pt is in stable condition upon d/c and ambulates from ED. 

## 2016-10-30 NOTE — Discharge Instructions (Signed)
Take the prescribed medication as directed.  Make sure to rest and drink fluids. Follow-up with your primary care doctor. Return to the ED for new or worsening symptoms. 

## 2016-11-01 ENCOUNTER — Ambulatory Visit: Payer: Self-pay | Admitting: Family Medicine

## 2016-11-08 ENCOUNTER — Emergency Department (HOSPITAL_COMMUNITY)
Admission: EM | Admit: 2016-11-08 | Discharge: 2016-11-08 | Disposition: A | Discharge status: Home / Self Care | Payer: No Typology Code available for payment source | Attending: Emergency Medicine | Admitting: Emergency Medicine

## 2016-11-08 DIAGNOSIS — R432 Parageusia: Secondary | ICD-10-CM

## 2016-11-08 DIAGNOSIS — J069 Acute upper respiratory infection, unspecified: Secondary | ICD-10-CM | POA: Insufficient documentation

## 2016-11-08 DIAGNOSIS — I10 Essential (primary) hypertension: Secondary | ICD-10-CM | POA: Insufficient documentation

## 2016-11-08 DIAGNOSIS — Z79899 Other long term (current) drug therapy: Secondary | ICD-10-CM | POA: Insufficient documentation

## 2016-11-08 DIAGNOSIS — R439 Unspecified disturbances of smell and taste: Secondary | ICD-10-CM | POA: Insufficient documentation

## 2016-11-08 DIAGNOSIS — E119 Type 2 diabetes mellitus without complications: Secondary | ICD-10-CM | POA: Insufficient documentation

## 2016-11-08 DIAGNOSIS — Z7984 Long term (current) use of oral hypoglycemic drugs: Secondary | ICD-10-CM | POA: Insufficient documentation

## 2016-11-08 LAB — COMPREHENSIVE METABOLIC PANEL
ALT: 25 U/L (ref 14–54)
AST: 20 U/L (ref 15–41)
Albumin: 3.5 g/dL (ref 3.5–5.0)
Alkaline Phosphatase: 87 U/L (ref 38–126)
Anion gap: 10 (ref 5–15)
BUN: 11 mg/dL (ref 6–20)
CO2: 22 mmol/L (ref 22–32)
Calcium: 9.3 mg/dL (ref 8.9–10.3)
Chloride: 106 mmol/L (ref 101–111)
Creatinine, Ser: 0.41 mg/dL — ABNORMAL LOW (ref 0.44–1.00)
GFR calc Af Amer: >60 mL/min (ref >60)
GFR calc non Af Amer: >60 mL/min (ref >60)
Glucose, Bld: 192 mg/dL — ABNORMAL HIGH (ref 65–99)
Potassium: 3.7 mmol/L (ref 3.5–5.1)
Sodium: 138 mmol/L (ref 135–145)
Total Bilirubin: 0.7 mg/dL (ref 0.3–1.2)
Total Protein: 6.7 g/dL (ref 6.5–8.1)

## 2016-11-08 LAB — URINALYSIS, ROUTINE W REFLEX MICROSCOPIC
Bilirubin Urine: NEGATIVE
Glucose, UA: NEGATIVE mg/dL
Hgb urine dipstick: NEGATIVE
Ketones, ur: NEGATIVE mg/dL
Leukocytes, UA: NEGATIVE
Nitrite: NEGATIVE
Protein, ur: NEGATIVE mg/dL
Specific Gravity, Urine: 1.015 (ref 1.005–1.030)
pH: 5 (ref 5.0–8.0)

## 2016-11-08 LAB — CBC
HCT: 39.3 % (ref 36.0–46.0)
Hemoglobin: 12.2 g/dL (ref 12.0–15.0)
MCH: 23.9 pg — ABNORMAL LOW (ref 26.0–34.0)
MCHC: 31 g/dL (ref 30.0–36.0)
MCV: 77.1 fL — ABNORMAL LOW (ref 78.0–100.0)
Platelets: 353 10*3/uL (ref 150–400)
RBC: 5.1 MIL/uL (ref 3.87–5.11)
RDW: 14.4 % (ref 11.5–15.5)
WBC: 7.1 10*3/uL (ref 4.0–10.5)

## 2016-11-08 LAB — LIPASE, BLOOD: Lipase: 28 U/L (ref 11–51)

## 2016-11-08 NOTE — Discharge Instructions (Signed)
Please read attached information. If you experience any new or worsening signs or symptoms please return to the emergency room for evaluation. Please follow-up with your primary care provider or specialist as discussed.  °

## 2016-11-08 NOTE — ED Provider Notes (Signed)
[facility] DEPT Provider Note   CSN: [id number] Arrival date & time: 11/08/16  1437     History   Chief Complaint Chief Complaint  Patient presents with  . Abdominal Pain    HPI Crystal [name] is a 56 y.o. female.  HPI   56 year old female presents today with complaints of abnormal taste. Patient reports that approximately one week ago she had a upper respiratory infection. Then sent home with Tessalon and ibuprofen. Patient notes her upper respiratory infection is dramatically improved, but she still has a cough, no shortness of breath chest pain fever or any other concerning signs or symptoms. Patient notes that over the last 3-4 days she's had a salty taste in her mouth. She reports this is more pronounced with eating or drinking. She reports history of the same approximately 8 years ago.  Patients triage summary notes she was complaining of abdominal pain. Upon my exam she reports very minimal abdominal discomfort. The ports normal color, clarity, and characteristics of her urine. Patient denies any headache, neck stiffness, confusion, neurological deficits, runny nose, or trauma.  Past Medical History:  Diagnosis Date  . Diabetes mellitus   . Hypertension   . Hyperthyroidism     Patient Active Problem List   Diagnosis Date Noted  . Spinal stenosis of cervical region 10/11/2016    Class: Chronic  . Cervical spinal stenosis 10/11/2016  . Hypertension 09/09/2016  . Diabetes mellitus ([place]) 09/09/2016  . Multiple thyroid nodules 01/27/2013  . Disorder of thyroid 07/07/2007  . VARICOSE VEIN 07/07/2007  . GERD 07/07/2007    Past Surgical History:  Procedure Laterality Date  . ANTERIOR CERVICAL DECOMP/DISCECTOMY FUSION N/A 10/11/2016   Procedure: ANTERIOR CERVICAL DISCECTOMY FUSION C6-7 WITH EXCISION OF OSTEOPHYTE;  Surgeon: [name], MD;  Location: [facility];  Service: Orthopedics;  Laterality: N/A;  . CESAREAN SECTION    . TUBAL LIGATION      OB History    No data  available       Home Medications    Prior to Admission medications   Medication Sig Start Date End Date Taking? Authorizing Provider  acetaminophen (TYLENOL) 500 MG tablet Take 1,000 mg by mouth daily as needed for moderate pain.     Historical Provider, MD  amLODipine (NORVASC) 10 MG tablet Take 1 tablet (10 mg total) by mouth daily. 09/09/16   Dorena Dew, FNP  benzonatate (TESSALON) 100 MG capsule Take 1 capsule (100 mg total) by mouth every 8 (eight) hours. 10/30/16   Larene Pickett, PA-C  Blood Glucose Monitoring Suppl (TRUE METRIX AIR GLUCOSE METER) w/Device KIT 1 each by Does not apply route every morning. 09/09/16   Dorena Dew, FNP  glipiZIDE (GLUCOTROL) 10 MG tablet Take 1 tablet (10 mg total) by mouth 2 (two) times daily before a meal. 09/09/16   Dorena Dew, FNP  glucose blood (TRUE METRIX BLOOD GLUCOSE TEST) test strip Use as instructed 09/10/16   Dorena Dew, FNP  HYDROcodone-acetaminophen (NORCO/VICODIN) 5-325 MG tablet Take 1 tablet by mouth every 4 (four) hours as needed for moderate pain (mild pain). 10/26/16   [name], MD  ibuprofen (ADVIL,MOTRIN) 800 MG tablet Take 1 tablet (800 mg total) by mouth 3 (three) times daily. 10/30/16   Larene Pickett, PA-C  loratadine (CLARITIN) 10 MG tablet Take 1 tablet (10 mg total) by mouth daily. Patient taking differently: Take 10 mg by mouth daily as needed for allergies.  09/09/16   Dorena Dew,  FNP  metFORMIN (GLUCOPHAGE) 1000 MG tablet Take 1 tablet (1,000 mg total) by mouth 2 (two) times daily with a meal. 09/09/16   Dorena Dew, FNP  methimazole (TAPAZOLE) 5 MG tablet Take 1 tablet (5 mg total) by mouth 2 (two) times daily. 09/10/16   Dorena Dew, FNP  methocarbamol (ROBAXIN) 500 MG tablet Take 1 tablet (500 mg total) by mouth every 6 (six) hours as needed for muscle spasms. 10/12/16   Newt Minion, MD  ondansetron (ZOFRAN ODT) 4 MG disintegrating tablet Take 1 tablet (4 mg total) by mouth every 8 (eight)  hours as needed for nausea. 10/30/16   Larene Pickett, PA-C  TRUEPLUS LANCETS 28G MISC USE AS INSTRUCTED 09/10/16   Dorena Dew, FNP    Family History No family history on file.  Social History Social History  Substance Use Topics  . Smoking status: Never Smoker  . Smokeless tobacco: Never Used  . Alcohol use No     Allergies   Aspirin and Tramadol   Review of Systems Review of Systems  All other systems reviewed and are negative.    Physical Exam Updated Vital Signs BP 123/64   Pulse 90   Temp 98.2 F (36.8 C) (Oral)   Resp 16   LMP 06/02/2013   SpO2 99%   Physical Exam  Constitutional: She is oriented to person, place, and time. She appears well-developed and well-nourished.  HENT:  Head: Normocephalic and atraumatic.  Nose: No rhinorrhea.  Mouth/Throat: Oropharynx is clear and moist.  Eyes: Conjunctivae are normal. Pupils are equal, round, and reactive to light. Right eye exhibits no discharge. Left eye exhibits no discharge. No scleral icterus.  Neck: Normal range of motion. No JVD present. No tracheal deviation present.  Pulmonary/Chest: Effort normal and breath sounds normal. No stridor. No respiratory distress. She has no wheezes. She has no rales. She exhibits no tenderness.  Abdominal: Soft. She exhibits no distension. There is no tenderness.  Neurological: She is alert and oriented to person, place, and time. Coordination normal.  Skin: Skin is warm.  Psychiatric: She has a normal mood and affect. Her behavior is normal. Judgment and thought content normal.  Nursing note and vitals reviewed.   ED Treatments / Results  Labs (all labs ordered are listed, but only abnormal results are displayed) Labs Reviewed  COMPREHENSIVE METABOLIC PANEL - Abnormal; Notable for the following:       Result Value   Glucose, Bld 192 (*)    Creatinine, Ser 0.41 (*)    All other components within normal limits  CBC - Abnormal; Notable for the following:    MCV 77.1  (*)    MCH 23.9 (*)    All other components within normal limits  URINALYSIS, ROUTINE W REFLEX MICROSCOPIC - Abnormal; Notable for the following:    APPearance HAZY (*)    All other components within normal limits  LIPASE, BLOOD    EKG  EKG Interpretation None       Radiology No results found.  Procedures Procedures (including critical care time)  Medications Ordered in ED Medications - No data to display   Initial Impression / Assessment and Plan / ED Course  I have reviewed the triage vital signs and the nursing notes.  Pertinent labs & imaging results that were available during my care of the patient were reviewed by me and considered in my medical decision making (see chart for details).      Final Clinical Impressions(s) /  ED Diagnoses   Final diagnoses:  Abnormal taste in mouth  Upper respiratory tract infection, unspecified type    Labs: Lipase, CMP, CBC, urinalysis  Imaging:     Consults:  Therapeutics:  Discharge Meds:   Assessment/Plan:   Patient presents with abnormal taste in her mouth. Uncertain etiology of patient's presentation. She is recently getting over an upper respiratory infection which could be related to her symptoms. She has no signs of CSF leak on my exam today, have very low suspicion for this, I have low suspicion for any significant etiology in this patient. Patient is very well appearing in no acute distress. She has a soft benign abdomen with no complaints of significant abdominal discomfort. Patient will close follow-up with her primary care provider for her ongoing symptoms, hypertension, and her elevated blood glucose. Patient is given strict return caution's, multiple patient and her son who was at bedside verbalized her understanding and agreement to today's plan had no further questions or concerns at time of discharge     New Prescriptions New Prescriptions   No medications on file     Okey Regal, PA-C 11/08/16  [place], MD 11/09/16 [phone]

## 2016-11-08 NOTE — ED Triage Notes (Signed)
Pt in from home c/o salty taste in her mouth & LLQ & LUQ pain, pt denies n/v/d, pt reports x 1 liquid stool yesterday, pt A&O x4

## 2016-11-08 NOTE — ED Provider Notes (Signed)
[place] of left upper quadrant pain for the past several days, however and no pain today. She also reports a salty taste in her mouth for the past 3-4 days. Salty taste is exacerbated by touching her lips or by eating or drinking anything. She denies fever denies chest pain denies headache denies rhinorrhea no treatment prior to coming here. Presently asymptomatic. On exam no distress HEENT exam mucous membranes moist no facial asymmetry neck supple trachea midline lungs clear auscultation heart regular rate and rhythm abdomen nondistended nontender normal active bowel sounds extremities without edema   Orlie Dakin, MD 11/08/16 1714

## 2016-11-11 MED FILL — AMLODIPINE BESYLATE 10 MG T: 10 | 30 days supply | Qty: 30 | Fill #1

## 2016-11-13 ENCOUNTER — Ambulatory Visit (INDEPENDENT_AMBULATORY_CARE_PROVIDER_SITE_OTHER): Payer: Self-pay | Admitting: Family Medicine

## 2016-11-13 ENCOUNTER — Encounter: Payer: Self-pay | Admitting: Family Medicine

## 2016-11-13 ENCOUNTER — Other Ambulatory Visit: Payer: Self-pay | Admitting: Family Medicine

## 2016-11-13 VITALS — BP 133/66 | HR 92 | Temp 97.8°F | Resp 18 | Ht 67.0 in | Wt 162.2 lb

## 2016-11-13 DIAGNOSIS — Z1159 Encounter for screening for other viral diseases: Secondary | ICD-10-CM

## 2016-11-13 DIAGNOSIS — K219 Gastro-esophageal reflux disease without esophagitis: Secondary | ICD-10-CM

## 2016-11-13 DIAGNOSIS — E079 Disorder of thyroid, unspecified: Secondary | ICD-10-CM

## 2016-11-13 DIAGNOSIS — I10 Essential (primary) hypertension: Secondary | ICD-10-CM

## 2016-11-13 DIAGNOSIS — Z114 Encounter for screening for human immunodeficiency virus [HIV]: Secondary | ICD-10-CM

## 2016-11-13 DIAGNOSIS — E119 Type 2 diabetes mellitus without complications: Secondary | ICD-10-CM

## 2016-11-13 DIAGNOSIS — Z23 Encounter for immunization: Secondary | ICD-10-CM

## 2016-11-13 LAB — TSH: TSH: 0.01 mIU/L — ABNORMAL LOW

## 2016-11-13 LAB — POCT GLYCOSYLATED HEMOGLOBIN (HGB A1C): Hemoglobin A1C: 8.3

## 2016-11-13 MED ORDER — GLIPIZIDE 10 MG PO TABS: 10.0000 mg | ORAL_TABLET | Freq: Two times a day (BID) | ORAL | 1 refills | Status: DC

## 2016-11-13 MED ORDER — METFORMIN HCL 1000 MG PO TABS
1000.0000 mg | ORAL_TABLET | Freq: Two times a day (BID) | ORAL | 1 refills | Status: DC
Start: 2016-11-13 — End: 2017-08-14

## 2016-11-13 MED ORDER — OMEPRAZOLE 20 MG PO CPDR: 20.0000 mg | DELAYED_RELEASE_CAPSULE | Freq: Every day | ORAL | 3 refills | Status: DC

## 2016-11-13 MED ORDER — AMLODIPINE BESYLATE 10 MG PO TABS: 10.0000 mg | ORAL_TABLET | Freq: Every day | ORAL | 1 refills | Status: DC

## 2016-11-13 MED FILL — glipiZIDE 10 MG TABS: 10 | 30 days supply | Qty: 60 | Fill #0

## 2016-11-13 MED FILL — ?OMEPRAZOLE DR 20 MG CAPSUL: 20 | 30 days supply | Qty: 30 | Fill #0

## 2016-11-13 MED FILL — ?METFORMIN HCL 1,000 MG TAB: 1000 | 30 days supply | Qty: 60 | Fill #0

## 2016-11-13 NOTE — Patient Instructions (Addendum)
Apply Neosporin to post surgical scar. Keep area clean and dry.  Diabetes and Foot Care Diabetes may cause you to have problems because of poor blood supply (circulation) to your feet and legs. This may cause the skin on your feet to become thinner, break easier, and heal more slowly. Your skin may become dry, and the skin may peel and crack. You may also have nerve damage in your legs and feet causing decreased feeling in them. You may not notice minor injuries to your feet that could lead to infections or more serious problems. Taking care of your feet is one of the most important things you can do for yourself. Follow these instructions at home:  Wear shoes at all times, even in the house. Do not go barefoot. Bare feet are easily injured.  Check your feet daily for blisters, cuts, and redness. If you cannot see the bottom of your feet, use a mirror or ask someone for help.  Wash your feet with warm water (do not use hot water) and mild soap. Then pat your feet and the areas between your toes until they are completely dry. Do not soak your feet as this can dry your skin.  Apply a moisturizing lotion or petroleum jelly (that does not contain alcohol and is unscented) to the skin on your feet and to dry, brittle toenails. Do not apply lotion between your toes.  Trim your toenails straight across. Do not dig under them or around the cuticle. File the edges of your nails with an emery board or nail file.  Do not cut corns or calluses or try to remove them with medicine.  Wear clean socks or stockings every day. Make sure they are not too tight. Do not wear knee-high stockings since they may decrease blood flow to your legs.  Wear shoes that fit properly and have enough cushioning. To break in new shoes, wear them for just a few hours a day. This prevents you from injuring your feet. Always look in your shoes before you put them on to be sure there are no objects inside.  Do not cross your legs.  This may decrease the blood flow to your feet.  If you find a minor scrape, cut, or break in the skin on your feet, keep it and the skin around it clean and dry. These areas may be cleansed with mild soap and water. Do not cleanse the area with peroxide, alcohol, or iodine.  When you remove an adhesive bandage, be sure not to damage the skin around it.  If you have a wound, look at it several times a day to make sure it is healing.  Do not use heating pads or hot water bottles. They may burn your skin. If you have lost feeling in your feet or legs, you may not know it is happening until it is too late.  Make sure your health care provider performs a complete foot exam at least annually or more often if you have foot problems. Report any cuts, sores, or bruises to your health care provider immediately. Contact a health care provider if:  You have an injury that is not healing.  You have cuts or breaks in the skin.  You have an ingrown nail.  You notice redness on your legs or feet.  You feel burning or tingling in your legs or feet.  You have pain or cramps in your legs and feet.  Your legs or feet are numb.  Your feet  always feel cold. Get help right away if:  There is increasing redness, swelling, or pain in or around a wound.  There is a red line that goes up your leg.  Pus is coming from a wound.  You develop a fever or as directed by your health care provider.  You notice a bad smell coming from an ulcer or wound. This information is not intended to replace advice given to you by your health care provider. Make sure you discuss any questions you have with your health care provider. Document Released: 09/20/2000 Document Revised: 02/29/2016 Document Reviewed: 03/02/2013 Elsevier Interactive Patient Education  2017 [place]. Diabetes Mellitus and Food It is important for you to manage your blood sugar (glucose) level. Your blood glucose level can be greatly affected  by what you eat. Eating healthier foods in the appropriate amounts throughout the day at about the same time each day will help you control your blood glucose level. It can also help slow or prevent worsening of your diabetes mellitus. Healthy eating may even help you improve the level of your blood pressure and reach or maintain a healthy weight. General recommendations for healthful eating and cooking habits include:  Eating meals and snacks regularly. Avoid going long periods of time without eating to lose weight.  Eating a diet that consists mainly of plant-based foods, such as fruits, vegetables, nuts, legumes, and whole grains.  Using low-heat cooking methods, such as baking, instead of high-heat cooking methods, such as deep frying. Work with your dietitian to make sure you understand how to use the Nutrition Facts information on food labels. How can food affect me? Carbohydrates  Carbohydrates affect your blood glucose level more than any other type of food. Your dietitian will help you determine how many carbohydrates to eat at each meal and teach you how to count carbohydrates. Counting carbohydrates is important to keep your blood glucose at a healthy level, especially if you are using insulin or taking certain medicines for diabetes mellitus. Alcohol  Alcohol can cause sudden decreases in blood glucose (hypoglycemia), especially if you use insulin or take certain medicines for diabetes mellitus. Hypoglycemia can be a life-threatening condition. Symptoms of hypoglycemia (sleepiness, dizziness, and disorientation) are similar to symptoms of having too much alcohol. If your health care provider has given you approval to drink alcohol, do so in moderation and use the following guidelines:  Women should not have more than one drink per day, and men should not have more than two drinks per day. One drink is equal to:  12 oz of beer.  5 oz of wine.  1 oz of hard liquor.  Do not drink on  an empty stomach.  Keep yourself hydrated. Have water, diet soda, or unsweetened iced tea.  Regular soda, juice, and other mixers might contain a lot of carbohydrates and should be counted. What foods are not recommended? As you make food choices, it is important to remember that all foods are not the same. Some foods have fewer nutrients per serving than other foods, even though they might have the same number of calories or carbohydrates. It is difficult to get your body what it needs when you eat foods with fewer nutrients. Examples of foods that you should avoid that are high in calories and carbohydrates but low in nutrients include:  Trans fats (most processed foods list trans fats on the Nutrition Facts label).  Regular soda.  Juice.  Candy.  Sweets, such as cake, pie, doughnuts, and  cookies.  Fried foods. What foods can I eat? Eat nutrient-rich foods, which will nourish your body and keep you healthy. The food you should eat also will depend on several factors, including:  The calories you need.  The medicines you take.  Your weight.  Your blood glucose level.  Your blood pressure level.  Your cholesterol level. You should eat a variety of foods, including:  Protein.  Lean cuts of meat.  Proteins low in saturated fats, such as fish, egg whites, and beans. Avoid processed meats.  Fruits and vegetables.  Fruits and vegetables that may help control blood glucose levels, such as apples, mangoes, and yams.  Dairy products.  Choose fat-free or low-fat dairy products, such as milk, yogurt, and cheese.  Grains, bread, pasta, and rice.  Choose whole grain products, such as multigrain bread, whole oats, and brown rice. These foods may help control blood pressure.  Fats.  Foods containing healthful fats, such as nuts, avocado, olive oil, canola oil, and fish. Does everyone with diabetes mellitus have the same meal plan? Because every person with diabetes mellitus  is different, there is not one meal plan that works for everyone. It is very important that you meet with a dietitian who will help you create a meal plan that is just right for you. This information is not intended to replace advice given to you by your health care provider. Make sure you discuss any questions you have with your health care provider. Document Released: 06/20/2005 Document Revised: 02/29/2016 Document Reviewed: 08/20/2013 Elsevier Interactive Patient Education  2017 [place] for Gastroesophageal Reflux Disease, Adult When you have gastroesophageal reflux disease (GERD), the foods you eat and your eating habits are very important. Choosing the right foods can help ease your discomfort. What guidelines do I need to follow?  Choose fruits, vegetables, whole grains, and low-fat dairy products.  Choose low-fat meat, fish, and poultry.  Limit fats such as oils, salad dressings, butter, nuts, and avocado.  Keep a food diary. This helps you identify foods that cause symptoms.  Avoid foods that cause symptoms. These may be different for everyone.  Eat small meals often instead of 3 large meals a day.  Eat your meals slowly, in a place where you are relaxed.  Limit fried foods.  Cook foods using methods other than frying.  Avoid drinking alcohol.  Avoid drinking large amounts of liquids with your meals.  Avoid bending over or lying down until 2-3 hours after eating. What foods are not recommended? These are some foods and drinks that may make your symptoms worse: Vegetables  Tomatoes. Tomato juice. Tomato and spaghetti sauce. Chili peppers. Onion and garlic. Horseradish. Fruits  Oranges, grapefruit, and lemon (fruit and juice). Meats  High-fat meats, fish, and poultry. This includes hot dogs, ribs, ham, sausage, salami, and bacon. Dairy  Whole milk and chocolate milk. Sour cream. Cream. Butter. Ice cream. Cream cheese. Drinks  Coffee and tea.  Bubbly (carbonated) drinks or energy drinks. Condiments  Hot sauce. Barbecue sauce. Sweets/Desserts  Chocolate and cocoa. Donuts. Peppermint and spearmint. Fats and Oils  High-fat foods. This includes [place] fries and potato chips. Other  Vinegar. Strong spices. This includes black pepper, white pepper, red pepper, cayenne, curry powder, cloves, ginger, and chili powder. The items listed above may not be a complete list of foods and drinks to avoid. Contact your dietitian for more information.  This information is not intended to replace advice given to you by your health  care provider. Make sure you discuss any questions you have with your health care provider. Document Released: 03/24/2012 Document Revised: 02/29/2016 Document Reviewed: 07/28/2013 Elsevier Interactive Patient Education  2017 [employer].

## 2016-11-13 NOTE — Progress Notes (Signed)
Subjective:    Patient ID: Crystal [name], female    DOB: Feb 19, 1961, 56 y.o.   MRN: [id number]  Diabetes  She has type 2 diabetes mellitus. No MedicAlert identification noted. Her disease course has been stable. There are no hypoglycemic associated symptoms. Pertinent negatives for diabetes include no blurred vision, no chest pain, no fatigue, no foot paresthesias, no foot ulcerations, no polydipsia, no polyphagia, no polyuria, no visual change, no weakness and no weight loss. There are no hypoglycemic complications. Symptoms are stable. There are no diabetic complications. Risk factors for coronary artery disease include diabetes mellitus, hypertension and obesity. Current diabetic treatment includes oral agent (dual therapy). She is compliant with treatment all of the time. She has not had a previous visit with a dietitian. She rarely participates in exercise. There is no change in her home blood glucose trend. She does not see a podiatrist.Eye exam is current.  Gastroesophageal Reflux  She complains of coughing and heartburn. She reports no abdominal pain, no belching, no chest pain, no choking, no dysphagia, no nausea, no sore throat, no stridor, no tooth decay, no water brash or no wheezing. This is a recurrent problem. The current episode started more than 1 month ago. The problem occurs frequently. The heartburn is located in the substernum. The heartburn is of mild intensity. The heartburn does not wake her from sleep. The heartburn does not limit her activity. The heartburn doesn't change with position. The symptoms are aggravated by certain foods and lying down. Pertinent negatives include no anemia, fatigue, melena, muscle weakness or weight loss. Risk factors include lack of exercise and obesity. She has tried an antacid for the symptoms. The treatment provided no relief. Past procedures do not include an abdominal ultrasound, an EGD, esophageal pH monitoring, H. pylori antibody titer or a UGI.   Hypertension  The current episode started more than 1 year ago. The problem is controlled. Associated symptoms include neck pain. Pertinent negatives include no blurred vision, chest pain or shortness of breath. Past treatments include calcium channel blockers.  .   Past Medical History:  Diagnosis Date  . Diabetes mellitus   . Hypertension   . Hyperthyroidism    Social History   Social History  . Marital status: Married    Spouse name: N/A  . Number of children: N/A  . Years of education: N/A   Occupational History  . Not on file.   Social History Main Topics  . Smoking status: Never Smoker  . Smokeless tobacco: Never Used  . Alcohol use No  . Drug use: No  . Sexual activity: Not on file   Other Topics Concern  . Not on file   Social History Narrative  . No narrative on file   Allergies  Allergen Reactions  . Aspirin Nausea And Vomiting  . Tramadol     Upset stomach    Review of Systems  Constitutional: Negative.  Negative for fatigue and weight loss.  HENT: Negative for sore throat.   Eyes: Positive for redness. Negative for blurred vision.  Respiratory: Positive for cough. Negative for choking, shortness of breath and wheezing.   Cardiovascular: Negative.  Negative for chest pain.  Gastrointestinal: Positive for heartburn. Negative for abdominal pain, dysphagia, melena and nausea.       Heartburn  Endocrine: Negative.  Negative for cold intolerance, heat intolerance, polydipsia, polyphagia and polyuria.  Genitourinary: Negative.   Musculoskeletal: Positive for back pain and neck pain. Negative for muscle weakness.  Skin: Negative.  Allergic/Immunologic: Negative for immunocompromised state.  Neurological: Negative.  Negative for weakness.  Hematological: Negative.   Psychiatric/Behavioral: Negative.        Objective:   Physical Exam  Constitutional: She is oriented to person, place, and time. She appears well-developed.  HENT:  Head: Normocephalic  and atraumatic.  Right Ear: External ear normal.  Left Ear: External ear normal.  Mouth/Throat: Oropharynx is clear and moist.  Eyes: Conjunctivae and EOM are normal. Pupils are equal, round, and reactive to light.  Neck: Trachea normal, normal range of motion and full passive range of motion without pain. Neck supple. Normal carotid pulses present. No neck rigidity. No edema and normal range of motion present.  Cardiovascular: Normal rate, regular rhythm, normal heart sounds and intact distal pulses.   Pulmonary/Chest: Effort normal and breath sounds normal.  Abdominal: Soft. Bowel sounds are normal.  Neurological: She is alert and oriented to person, place, and time. She has normal reflexes. No cranial nerve deficit. Coordination and gait normal.  Microfilament examination normal  Skin: Skin is warm and dry.  Psychiatric: She has a normal mood and affect. Her behavior is normal. Judgment and thought content normal.       BP 133/66 (BP Location: Right Arm, Patient Position: Sitting, Cuff Size: Large)   Pulse 92   Temp 97.8 F (36.6 C) (Oral)   Resp 18   Ht 5\' 7"  (1.702 m)   Wt 162 lb 3.2 oz (73.6 kg)   LMP 06/02/2013   SpO2 99%   BMI 25.40 kg/m  Assessment & Plan:  1. Type 2 diabetes mellitus without complication, without long-term current use of insulin (HCC)  - HgB A1c - metFORMIN (GLUCOPHAGE) 1000 MG tablet; Take 1 tablet (1,000 mg total) by mouth 2 (two) times daily with a meal.  Dispense: 180 tablet; Refill: 1 - glipiZIDE (GLUCOTROL) 10 MG tablet; Take 1 tablet (10 mg total) by mouth 2 (two) times daily before a meal.  Dispense: 180 tablet; Refill: 1  2. Essential hypertension Blood pressure is at goal on current medication.  - amLODipine (NORVASC) 10 MG tablet; Take 1 tablet (10 mg total) by mouth daily.  Dispense: 90 tablet; Refill: 1  3. Gastroesophageal reflux disease without esophagitis The patient is asked to make an attempt to improve diet and exercise patterns to  aid in medical management of this problem. - omeprazole (PRILOSEC) 20 MG capsule; Take 1 capsule (20 mg total) by mouth daily.  Dispense: 30 capsule; Refill: 3  4. Disorder of thyroid  - TSH  5. Need for Tdap vaccination  - Tdap vaccine greater than or equal to 7yo IM  6. Need for hepatitis C screening test  - Hepatitis C Antibody  7. Screening for HIV (human immunodeficiency virus)  - HIV antibody (with reflex)  Health Maintenance:  Schedule pap smear on next visit Patient refused vaccinations Recommend a lowfat, low carbohydrate diet divided over 5-6 small meals, increase water intake to 6-8 glasses, and 150 minutes per week of cardiovascular exercise.    March 3rd appointment for eye exam RTC: 3 months for DMII, HTN, and thyroid disorder   Williamson Cavanah M, FNP

## 2016-11-14 ENCOUNTER — Other Ambulatory Visit: Payer: Self-pay | Admitting: Family Medicine

## 2016-11-14 LAB — HEPATITIS C ANTIBODY: HCV Ab: NEGATIVE

## 2016-11-14 LAB — T4: T4, Total: 7.6 ug/dL (ref 4.5–12.0)

## 2016-11-14 LAB — T3, FREE: T3, Free: 4 pg/mL (ref 2.3–4.2)

## 2016-11-14 LAB — HIV ANTIBODY (ROUTINE TESTING W REFLEX): HIV 1&2 Ab, 4th Generation: NONREACTIVE

## 2016-11-18 ENCOUNTER — Other Ambulatory Visit: Payer: Self-pay | Admitting: Family Medicine

## 2016-11-18 DIAGNOSIS — E042 Nontoxic multinodular goiter: Secondary | ICD-10-CM

## 2016-11-18 DIAGNOSIS — E0590 Thyrotoxicosis, unspecified without thyrotoxic crisis or storm: Secondary | ICD-10-CM

## 2016-11-20 ENCOUNTER — Telehealth: Payer: Self-pay

## 2016-11-20 NOTE — Telephone Encounter (Signed)
Called using [employer] Id # [id number] T. No answer Interpreter left message for patient to call back to our office.   When Patient calls back we need to notify her of Ultrasound appointment scheduled for [place] on 11/25/2016 @2 :45pm. Thanks!

## 2016-11-22 ENCOUNTER — Ambulatory Visit: Payer: No Typology Code available for payment source | Attending: Family Medicine

## 2016-11-25 ENCOUNTER — Ambulatory Visit (HOSPITAL_COMMUNITY): Payer: No Typology Code available for payment source

## 2016-12-02 ENCOUNTER — Ambulatory Visit (INDEPENDENT_AMBULATORY_CARE_PROVIDER_SITE_OTHER): Payer: Self-pay | Admitting: Specialist

## 2016-12-02 ENCOUNTER — Ambulatory Visit (INDEPENDENT_AMBULATORY_CARE_PROVIDER_SITE_OTHER): Payer: Self-pay

## 2016-12-02 ENCOUNTER — Encounter (INDEPENDENT_AMBULATORY_CARE_PROVIDER_SITE_OTHER): Payer: Self-pay | Admitting: Specialist

## 2016-12-02 VITALS — BP 144/80 | HR 83 | Ht 61.0 in | Wt 175.0 lb

## 2016-12-02 DIAGNOSIS — Z981 Arthrodesis status: Secondary | ICD-10-CM

## 2016-12-02 DIAGNOSIS — T8189XD Other complications of procedures, not elsewhere classified, subsequent encounter: Secondary | ICD-10-CM

## 2016-12-02 MED ORDER — CEPHALEXIN 500 MG PO CAPS: 500.0000 mg | ORAL_CAPSULE | Freq: Four times a day (QID) | ORAL | 0 refills | Status: DC

## 2016-12-02 NOTE — Patient Instructions (Addendum)
Avoid overhead lifting and overhead use of the arms. Do not lift greater than 5 lbs. Adjust head rest in vehicle to prevent hyperextension if rear ended. Start mupuricin oint BID, Start Keflex 500 mg every 6 hours for 10 days.

## 2016-12-02 NOTE — Progress Notes (Addendum)
   Post-Op Visit Note   Patient: Janiene Peine           Date of Birth: 09/11/1961           MRN: [id number] Visit Date: 12/02/2016 PCP: Dorena Dew, FNP   Assessment & Plan:7 weeks post op from ACDF C6-7, has a stitch abscess of the incision site that is opened today and started on mupuricin oint BID for 1-2 weeks.   Chief Complaint:  Ms. Fatica is here for her 7 week 3 day post Anterior cervical discectomy fusion at C6-7 with excision of osteophyte.  She states that she is doing great overall.  She states that she does have the occasional right shoulder pain.   Visit Diagnoses:  1. Status post cervical spinal fusion   Medial inch of the incision is with mild erthryema and suferficially, opened with a 22 G needle and explored no retained suture, she reports a stitch  Came out 2days ago. Start mupuricin oint BID, Xrays show good position and alignment, UE are NV normal.    Plan: Avoid overhead lifting and overhead use of the arms. Do not lift greater than 5 lbs. Adjust head rest in vehicle to prevent hyperextension if rear ended. Start mupuricin oint BID, Start Keflex 500 mg every 6 hours for 10 days.  May discontinue the use of the cervical collar'  Follow-Up Instructions: No Follow-up on file.   Orders:  Orders Placed This Encounter  Procedures  . XR Cervical Spine 2 or 3 views   No orders of the defined types were placed in this encounter.   Imaging: No results found.  PMFS History: Patient Active Problem List   Diagnosis Date Noted  . Spinal stenosis of cervical region 10/11/2016    Class: Chronic  . Cervical spinal stenosis 10/11/2016  . Hypertension 09/09/2016  . Diabetes mellitus ([place]) 09/09/2016  . Multiple thyroid nodules 01/27/2013  . Disorder of thyroid 07/07/2007  . VARICOSE VEIN 07/07/2007  . GERD 07/07/2007   Past Medical History:  Diagnosis Date  . Diabetes mellitus   . Hypertension   . Hyperthyroidism     No family history on file.  Past  Surgical History:  Procedure Laterality Date  . ANTERIOR CERVICAL DECOMP/DISCECTOMY FUSION N/A 10/11/2016   Procedure: ANTERIOR CERVICAL DISCECTOMY FUSION C6-7 WITH EXCISION OF OSTEOPHYTE;  Surgeon: [name], MD;  Location: [facility];  Service: Orthopedics;  Laterality: N/A;  . CESAREAN SECTION    . TUBAL LIGATION     Social History   Occupational History  . Not on file.   Social History Main Topics  . Smoking status: Never Smoker  . Smokeless tobacco: Never Used  . Alcohol use No  . Drug use: No  . Sexual activity: Not on file

## 2016-12-03 MED FILL — CEPHALEXIN 500 MG CAPSULE: 500 | 10 days supply | Qty: 40 | Fill #0

## 2016-12-09 ENCOUNTER — Ambulatory Visit (HOSPITAL_COMMUNITY): Payer: No Typology Code available for payment source | Attending: Family Medicine

## 2016-12-12 MED FILL — ?AMLODIPINE BESYLATE 10 MG: 10 | 30 days supply | Qty: 30 | Fill #2

## 2016-12-12 MED FILL — ?METFORMIN HCL 1,000 MG TAB: 1000 | 30 days supply | Qty: 60 | Fill #1

## 2016-12-12 MED FILL — methIMAzole 5 MG TABS: 5 | 30 days supply | Qty: 60 | Fill #1

## 2016-12-12 MED FILL — glipiZIDE 10 MG TABS: 10 | 30 days supply | Qty: 60 | Fill #1

## 2016-12-16 MED FILL — ?OMEPRAZOLE DR 20 MG CAPSUL: 20 | 30 days supply | Qty: 30 | Fill #1

## 2016-12-25 MED FILL — AMLODIPINE BESYLATE 10 MG T: 10 | 30 days supply | Qty: 30 | Fill #0

## 2017-01-20 ENCOUNTER — Ambulatory Visit (INDEPENDENT_AMBULATORY_CARE_PROVIDER_SITE_OTHER): Payer: Self-pay

## 2017-01-20 ENCOUNTER — Ambulatory Visit (INDEPENDENT_AMBULATORY_CARE_PROVIDER_SITE_OTHER): Payer: Self-pay | Admitting: Specialist

## 2017-01-20 VITALS — BP 164/90 | HR 82 | Ht 66.0 in | Wt 171.0 lb

## 2017-01-20 DIAGNOSIS — M7062 Trochanteric bursitis, left hip: Secondary | ICD-10-CM

## 2017-01-20 DIAGNOSIS — M25552 Pain in left hip: Secondary | ICD-10-CM

## 2017-01-20 MED ORDER — NAPROXEN-ESOMEPRAZOLE 500-20 MG PO TBEC: 1.0000 | DELAYED_RELEASE_TABLET | Freq: Two times a day (BID) | ORAL | 1 refills | Status: DC | PRN

## 2017-01-24 ENCOUNTER — Other Ambulatory Visit: Payer: Self-pay | Admitting: Family Medicine

## 2017-01-24 ENCOUNTER — Telehealth (INDEPENDENT_AMBULATORY_CARE_PROVIDER_SITE_OTHER): Payer: Self-pay | Admitting: Radiology

## 2017-01-24 MED ORDER — NAPROXEN-ESOMEPRAZOLE 500-20 MG PO TBEC: 1.0000 | DELAYED_RELEASE_TABLET | Freq: Two times a day (BID) | ORAL | 1 refills | Status: DC | PRN

## 2017-01-24 MED FILL — ?OMEPRAZOLE DR 20 MG CAPSUL: 20 | 30 days supply | Qty: 30 | Fill #2

## 2017-01-24 MED FILL — metFORMIN HCL 1000 MG TABS: 1000 | 30 days supply | Qty: 60 | Fill #2

## 2017-01-24 MED FILL — ?AMLODIPINE BESYLATE 10 MG: 10 | 30 days supply | Qty: 30 | Fill #1

## 2017-01-24 MED FILL — ?GLIPIZIDE 10 MG TABLET: 10 | 30 days supply | Qty: 60 | Fill #2

## 2017-01-24 NOTE — Telephone Encounter (Signed)
Patient came in office at 445pm Friday, she asked about the Rx Vimovo.  I resent it to [facility] and she will call them to followup.  I do not think that they will be able to fill this for her as she has no insurance.  Do you want to recommend another Rx for her? I will call her Monday to followup- Please advise

## 2017-01-27 MED FILL — methIMAzole 5 MG TABS: 5 | 30 days supply | Qty: 60 | Fill #0

## 2017-01-31 ENCOUNTER — Telehealth (INDEPENDENT_AMBULATORY_CARE_PROVIDER_SITE_OTHER): Payer: Self-pay | Admitting: Specialist

## 2017-01-31 ENCOUNTER — Encounter (INDEPENDENT_AMBULATORY_CARE_PROVIDER_SITE_OTHER): Payer: Self-pay | Admitting: Specialist

## 2017-01-31 NOTE — Telephone Encounter (Signed)
Please advise 

## 2017-01-31 NOTE — Progress Notes (Signed)
   Post-Op Visit Note   Patient: Crystal [name]           Date of Birth: [birth year]           MRN: [id number] Visit Date: 01/20/2017 PCP: Dorena Dew, FNP   Assessment & Plan:Now 3 months following ACDF C6-7 with plate and screws and titanium cage with local and vivigen graft. She has no upper extremity weakness or radicular symptoms. Complaints of left hip discomfort. With clinical signs and symptoms of greater trochanteric bursitis, offered injection, declined, consider PT and  Stretching exercises.   Chief Complaint:  Chief Complaint  Patient presents with  . Neck - Follow-up   Visit Diagnoses:  1. Pain of left hip joint   2. Greater trochanteric bursitis of left hip   Tender left greater trochanter, positive ober sign. Pain increases with hip abduction and flexion and extension. Plain radiographs wih minimal degenerative hip joint changes and spur in the area of attachment of the gluteus muscle into the superior aspect of the left greater trochanter.  Incision left neck healed, UE motor and sensory is normal. Cervical spine ROM shows loss of lateral bending or rotto  Plan: Avoid overhead lifting and overhead use of the arms. Do not lift greater than 5 lbs. Adjust head rest in vehicle to prevent hyperextension if rear ended. Take extra precautions to avoid falling, including use of a cane if you feel weak. Given a manual and instructed on left hip ITB stretching exercises, Consider PT. Use Ice or heat for discomfort. .   Follow-Up Instructions: Return in about 4 weeks (around 02/17/2017).   Orders:  Orders Placed This Encounter  Procedures  . XR HIP UNILAT W OR W/O PELVIS 2-3 VIEWS LEFT   Meds ordered this encounter  Medications  . DISCONTD: Naproxen-Esomeprazole (VIMOVO) 500-20 MG TBEC    Sig: Take 1 tablet by mouth every 12 (twelve) hours as needed.    Dispense:  60 tablet    Refill:  1  . Naproxen-Esomeprazole (VIMOVO) 500-20 MG TBEC    Sig: Take 1 tablet by  mouth every 12 (twelve) hours as needed.    Dispense:  60 tablet    Refill:  1    Imaging: No results found.  PMFS History: Patient Active Problem List   Diagnosis Date Noted  . Spinal stenosis of cervical region 10/11/2016    Priority: High    Class: Chronic  . Cervical spinal stenosis 10/11/2016  . Hypertension 09/09/2016  . Diabetes mellitus ([place]) 09/09/2016  . Multiple thyroid nodules 01/27/2013  . Disorder of thyroid 07/07/2007  . VARICOSE VEIN 07/07/2007  . GERD 07/07/2007   Past Medical History:  Diagnosis Date  . Diabetes mellitus   . Hypertension   . Hyperthyroidism     No family history on file.  Past Surgical History:  Procedure Laterality Date  . ANTERIOR CERVICAL DECOMP/DISCECTOMY FUSION N/A 10/11/2016   Procedure: ANTERIOR CERVICAL DISCECTOMY FUSION C6-7 WITH EXCISION OF OSTEOPHYTE;  Surgeon: [name], MD;  Location: [facility];  Service: Orthopedics;  Laterality: N/A;  . CESAREAN SECTION    . TUBAL LIGATION     Social History   Occupational History  . Not on file.   Social History Main Topics  . Smoking status: Never Smoker  . Smokeless tobacco: Never Used  . Alcohol use No  . Drug use: No  . Sexual activity: Not on file

## 2017-01-31 NOTE — Patient Instructions (Addendum)
Plan: Avoid overhead lifting and overhead use of the arms. Do not lift greater than 5 lbs. Adjust head rest in vehicle to prevent hyperextension if rear ended. Take extra precautions to avoid falling, including use of a cane if you feel weak. Given a manual and instructed on left hip ITB stretching exercises, Consider PT. Use Ice or heat for discomfort. [name]

## 2017-01-31 NOTE — Telephone Encounter (Signed)
Patient said the pharmacy called and said that the last RX written for her was over 1,000 dollars and she cannot afford that. She was wondering if she could be prescribed something cheaper and if it could be sent to the [facility] at [facility]. CB # [phone]

## 2017-02-03 NOTE — Telephone Encounter (Signed)
error 

## 2017-02-11 ENCOUNTER — Ambulatory Visit: Payer: No Typology Code available for payment source | Admitting: Family Medicine

## 2017-02-27 ENCOUNTER — Ambulatory Visit (INDEPENDENT_AMBULATORY_CARE_PROVIDER_SITE_OTHER): Payer: Self-pay | Admitting: Specialist

## 2017-02-27 ENCOUNTER — Encounter (INDEPENDENT_AMBULATORY_CARE_PROVIDER_SITE_OTHER): Payer: Self-pay | Admitting: Specialist

## 2017-02-27 VITALS — BP 132/80 | HR 87 | Ht 66.0 in | Wt 171.0 lb

## 2017-02-27 DIAGNOSIS — M5136 Other intervertebral disc degeneration, lumbar region: Secondary | ICD-10-CM

## 2017-02-27 DIAGNOSIS — M25552 Pain in left hip: Secondary | ICD-10-CM

## 2017-02-27 DIAGNOSIS — M4322 Fusion of spine, cervical region: Secondary | ICD-10-CM

## 2017-02-27 DIAGNOSIS — M48061 Spinal stenosis, lumbar region without neurogenic claudication: Secondary | ICD-10-CM

## 2017-02-27 MED ORDER — IBUPROFEN 600 MG PO TABS: ORAL_TABLET | ORAL | 4 refills | Status: DC

## 2017-02-27 NOTE — Patient Instructions (Signed)
Avoid bending, stooping and avoid lifting weights greater than 10 lbs. Avoid prolong standing and walking. Avoid frequent bending and stooping  No lifting greater than 10 lbs. May use ice or moist heat for pain. Weight loss is of benefit. Handicap license is approved.  

## 2017-02-27 NOTE — Progress Notes (Signed)
Office Visit Note   Patient: Crystal [name]           Date of Birth: 01-Jan-1961           MRN: [id number] Visit Date: 02/27/2017              Requested by: Dorena Dew, FNP [address], [place] [address] PCP: Dorena Dew, FNP   Assessment & Plan: Visit Diagnoses:  1. Pain of left hip joint   2. Spinal stenosis of lumbar region without neurogenic claudication   3. Degenerative disc disease, lumbar   4. Cervical vertebral fusion     Plan:Avoid bending, stooping and avoid lifting weights greater than 10 lbs. Avoid prolong standing and walking. Avoid frequent bending and stooping  No lifting greater than 10 lbs. May use ice or moist heat for pain. Weight loss is of benefit. Handicap license is approved.  Follow-Up Instructions: No Follow-up on file.   Orders:  No orders of the defined types were placed in this encounter.  No orders of the defined types were placed in this encounter.     Procedures: No procedures performed   Clinical Data: No additional findings.   Subjective: Chief Complaint  Patient presents with  . Left Hip - Follow-up    HPI  Review of Systems  Constitutional: Negative.   HENT: Negative.   Eyes: Negative.   Respiratory: Negative.   Cardiovascular: Negative.   Gastrointestinal: Negative.   Endocrine: Negative.   Genitourinary: Negative.   Musculoskeletal: Negative.   Skin: Negative.   Allergic/Immunologic: Negative.   Neurological: Negative.   Hematological: Negative.   Psychiatric/Behavioral: Negative.      Objective: Vital Signs: BP 132/80 (BP Location: Left Arm, Patient Position: Sitting)   Pulse 87   Ht 5\' 6"  (1.676 m)   Wt 171 lb (77.6 kg)   LMP 06/02/2013   BMI 27.60 kg/m   Physical Exam  Constitutional: She is oriented to person, place, and time. She appears well-developed and well-nourished.  HENT:  Head: Normocephalic and atraumatic.  Eyes: EOM are normal. Pupils are equal, round,  and reactive to light.  Neck: Normal range of motion. Neck supple.  Pulmonary/Chest: Effort normal and breath sounds normal.  Abdominal: Soft. Bowel sounds are normal.  Musculoskeletal: Normal range of motion.  Neurological: She is alert and oriented to person, place, and time.  Skin: Skin is warm and dry.  Psychiatric: She has a normal mood and affect. Her behavior is normal. Judgment and thought content normal.    Back Exam   Tenderness  The patient is experiencing tenderness in the lumbar and cervical.  Range of Motion  Extension: normal  Flexion: normal  Lateral Bend Right: normal  Rotation Right: normal       Specialty Comments:  No specialty comments available.  Imaging: No results found.   PMFS History: Patient Active Problem List   Diagnosis Date Noted  . Spinal stenosis of cervical region 10/11/2016    Priority: High    Class: Chronic  . Cervical spinal stenosis 10/11/2016  . Hypertension 09/09/2016  . Diabetes mellitus ([place]) 09/09/2016  . Multiple thyroid nodules 01/27/2013  . Disorder of thyroid 07/07/2007  . VARICOSE VEIN 07/07/2007  . GERD 07/07/2007   Past Medical History:  Diagnosis Date  . Diabetes mellitus   . Hypertension   . Hyperthyroidism     No family history on file.  Past Surgical History:  Procedure Laterality Date  . ANTERIOR CERVICAL  DECOMP/DISCECTOMY FUSION N/A 10/11/2016   Procedure: ANTERIOR CERVICAL DISCECTOMY FUSION C6-7 WITH EXCISION OF OSTEOPHYTE;  Surgeon: [name], MD;  Location: [place];  Service: Orthopedics;  Laterality: N/A;  . CESAREAN SECTION    . TUBAL LIGATION     Social History   Occupational History  . Not on file.   Social History Main Topics  . Smoking status: Never Smoker  . Smokeless tobacco: Never Used  . Alcohol use No  . Drug use: No  . Sexual activity: Not on file

## 2017-02-28 ENCOUNTER — Other Ambulatory Visit: Payer: Self-pay | Admitting: Family Medicine

## 2017-02-28 ENCOUNTER — Ambulatory Visit: Payer: No Typology Code available for payment source | Attending: Internal Medicine

## 2017-02-28 DIAGNOSIS — K219 Gastro-esophageal reflux disease without esophagitis: Secondary | ICD-10-CM

## 2017-02-28 MED FILL — IBUPROFEN 600 MG TABLET: 600 | 20 days supply | Qty: 60 | Fill #0

## 2017-02-28 MED FILL — methIMAzole 5 MG TABS: 5 | 30 days supply | Qty: 60 | Fill #1

## 2017-02-28 MED FILL — ?OMEPRAZOLE DR 20 MG CAPSUL: 20 | 30 days supply | Qty: 30 | Fill #3

## 2017-02-28 MED FILL — ?GLIPIZIDE 10 MG TABLET: 10 | 30 days supply | Qty: 60 | Fill #3

## 2017-02-28 MED FILL — ?AMLODIPINE BESYLATE 10 MG: 10 | 30 days supply | Qty: 30 | Fill #2

## 2017-02-28 MED FILL — ?METFORMIN HCL 1,000 MG TAB: 1000 | 30 days supply | Qty: 60 | Fill #3

## 2017-03-13 ENCOUNTER — Emergency Department (HOSPITAL_COMMUNITY)
Admission: EM | Admit: 2017-03-13 | Discharge: 2017-03-13 | Disposition: A | Discharge status: Home / Self Care | Payer: No Typology Code available for payment source | Attending: Emergency Medicine | Admitting: Emergency Medicine

## 2017-03-13 ENCOUNTER — Ambulatory Visit: Payer: No Typology Code available for payment source | Admitting: Family Medicine

## 2017-03-13 ENCOUNTER — Encounter (HOSPITAL_COMMUNITY): Payer: Self-pay | Admitting: Emergency Medicine

## 2017-03-13 DIAGNOSIS — I10 Essential (primary) hypertension: Secondary | ICD-10-CM | POA: Insufficient documentation

## 2017-03-13 DIAGNOSIS — Z79899 Other long term (current) drug therapy: Secondary | ICD-10-CM | POA: Insufficient documentation

## 2017-03-13 DIAGNOSIS — J0100 Acute maxillary sinusitis, unspecified: Secondary | ICD-10-CM | POA: Insufficient documentation

## 2017-03-13 DIAGNOSIS — Z7984 Long term (current) use of oral hypoglycemic drugs: Secondary | ICD-10-CM | POA: Insufficient documentation

## 2017-03-13 DIAGNOSIS — E119 Type 2 diabetes mellitus without complications: Secondary | ICD-10-CM | POA: Insufficient documentation

## 2017-03-13 MED ORDER — AMOXICILLIN 500 MG PO CAPS: 500.0000 mg | ORAL_CAPSULE | Freq: Three times a day (TID) | ORAL | 0 refills | Status: DC

## 2017-03-13 NOTE — ED Triage Notes (Signed)
Pt st's she has hx of allergies and medication is not working anymore,.  Pt c/o facial pain, headache and stopped up nose

## 2017-03-13 NOTE — ED Notes (Signed)
See EDP secondary assessment.  

## 2017-03-13 NOTE — Discharge Instructions (Signed)
Follow up with your doctor.  Return here as needed 

## 2017-03-13 NOTE — ED Provider Notes (Signed)
[facility] DEPT Provider Note   CSN: [id number] Arrival date & time: 03/13/17  1509  By signing my name below, I, [name], attest that this documentation has been prepared under the direction and in the presence of [place]. Janit Bern, NP. Electronically Signed: [name], Scribe. 03/13/2017. 5:02 PM.  History   Chief Complaint Chief Complaint  Patient presents with  . Allergies   The history is provided by the patient. No language interpreter was used.    HPI Comments: Crystal [name] is a 56 y.o. female with PMHx including DM and HTN who presents to the Emergency Department complaining of constant sinus pain for about one week. She reports some associated nasal congestion and bilateral ear pain. Patient states these symptoms are consistent with her h/o seasonal allergies. She has used an OTC allergy relief medication without significant improvement of her symptoms. No other medications or treatments tried. No known allergies to antibiotics. Patient denies fevers, chills, headaches, rhinorrhea, or any other associated symptoms.  Past Medical History:  Diagnosis Date  . Diabetes mellitus   . Hypertension   . Hyperthyroidism     Patient Active Problem List   Diagnosis Date Noted  . Spinal stenosis of cervical region 10/11/2016    Class: Chronic  . Cervical spinal stenosis 10/11/2016  . Hypertension 09/09/2016  . Diabetes mellitus ([facility]) 09/09/2016  . Multiple thyroid nodules 01/27/2013  . Disorder of thyroid 07/07/2007  . VARICOSE VEIN 07/07/2007  . GERD 07/07/2007    Past Surgical History:  Procedure Laterality Date  . ANTERIOR CERVICAL DECOMP/DISCECTOMY FUSION N/A 10/11/2016   Procedure: ANTERIOR CERVICAL DISCECTOMY FUSION C6-7 WITH EXCISION OF OSTEOPHYTE;  Surgeon: [name], MD;  Location: [facility];  Service: Orthopedics;  Laterality: N/A;  . CESAREAN SECTION    . TUBAL LIGATION      OB History    No data available       Home Medications    Prior to  Admission medications   Medication Sig Start Date End Date Taking? Authorizing Provider  acetaminophen (TYLENOL) 500 MG tablet Take 1,000 mg by mouth daily as needed for moderate pain.     [provider]  amLODipine (NORVASC) 10 MG tablet Take 1 tablet (10 mg total) by mouth daily. 11/13/16   Dorena Dew, FNP  amoxicillin (AMOXIL) 500 MG capsule Take 1 capsule (500 mg total) by mouth 3 (three) times daily. 03/13/17   Ashley Murrain, NP  benzonatate (TESSALON) 100 MG capsule Take 1 capsule (100 mg total) by mouth every 8 (eight) hours. 10/30/16   Larene Pickett, PA-C  Blood Glucose Monitoring Suppl (TRUE METRIX AIR GLUCOSE METER) w/Device KIT 1 each by Does not apply route every morning. 09/09/16   Dorena Dew, FNP  glipiZIDE (GLUCOTROL) 10 MG tablet Take 1 tablet (10 mg total) by mouth 2 (two) times daily before a meal. 11/13/16   Dorena Dew, FNP  glucose blood (TRUE METRIX BLOOD GLUCOSE TEST) test strip Use as instructed 09/10/16   Dorena Dew, FNP  HYDROcodone-acetaminophen (NORCO/VICODIN) 5-325 MG tablet Take 1 tablet by mouth every 4 (four) hours as needed for moderate pain (mild pain). 10/26/16   [name], MD  ibuprofen (ADVIL,MOTRIN) 600 MG tablet Take one tablet up to 3 times a day with meal or snack for left hip pain 02/27/17   [name], MD  loratadine (CLARITIN) 10 MG tablet Take 1 tablet (10 mg total) by mouth daily. Patient taking differently: Take 10 mg  by mouth daily as needed for allergies.  09/09/16   Dorena Dew, FNP  metFORMIN (GLUCOPHAGE) 1000 MG tablet Take 1 tablet (1,000 mg total) by mouth 2 (two) times daily with a meal. 11/13/16   Dorena Dew, FNP  methimazole (TAPAZOLE) 5 MG tablet TAKE 1 TABLET BY MOUTH 2 TIMES DAILY. 02/28/17   Dorena Dew, FNP  methocarbamol (ROBAXIN) 500 MG tablet Take 1 tablet (500 mg total) by mouth every 6 (six) hours as needed for muscle spasms. 10/12/16   Newt Minion, MD  Naproxen-Esomeprazole  (VIMOVO) 500-20 MG TBEC Take 1 tablet by mouth every 12 (twelve) hours as needed. 01/24/17   Lanae Crumbly, PA-C  omeprazole (PRILOSEC) 20 MG capsule TAKE 1 CAPSULE BY MOUTH DAILY. 02/28/17   Dorena Dew, FNP  ondansetron (ZOFRAN ODT) 4 MG disintegrating tablet Take 1 tablet (4 mg total) by mouth every 8 (eight) hours as needed for nausea. 10/30/16   Larene Pickett, PA-C  TRUEPLUS LANCETS 28G MISC USE AS INSTRUCTED 09/10/16   Dorena Dew, FNP    Family History No family history on file.  Social History Social History  Substance Use Topics  . Smoking status: Never Smoker  . Smokeless tobacco: Never Used  . Alcohol use No     Allergies   Aspirin and Tramadol   Review of Systems Review of Systems  Constitutional: Negative for chills and fever.  HENT: Positive for congestion, ear pain and sinus pain. Negative for rhinorrhea.   Neurological: Negative for headaches.  All other systems reviewed and are negative.  Physical Exam Updated Vital Signs BP (!) 142/81 (BP Location: Left Arm)   Pulse 93   Temp 98.9 F (37.2 C) (Oral)   Resp 20   Ht _0  (1.676 m)   Wt 77.6 kg (171 lb)   LMP 06/02/2013   SpO2 99%   BMI 27.60 kg/m   Physical Exam  Constitutional: She is oriented to person, place, and time. She appears well-developed and well-nourished. No distress.  HENT:  Head: Normocephalic and atraumatic.  Right Ear: Tympanic membrane normal.  Left Ear: Tympanic membrane normal.  Nose: Mucosal edema and rhinorrhea present. Right sinus exhibits maxillary sinus tenderness. Left sinus exhibits maxillary sinus tenderness.  Mouth/Throat: Uvula is midline and mucous membranes are normal. No posterior oropharyngeal edema or posterior oropharyngeal erythema.  Eyes: Conjunctivae and EOM are normal. Pupils are equal, round, and reactive to light.  Neck: Normal range of motion and full passive range of motion without pain. Neck supple. No tracheal deviation present. No  Brudzinski's sign and no Kernig's sign noted.  Cardiovascular: Normal rate and regular rhythm.   Pulmonary/Chest: Effort normal and breath sounds normal.  Lungs are clear to auscultation.  Abdominal: Soft. Bowel sounds are normal. There is no tenderness.  Musculoskeletal: Normal range of motion.  Lymphadenopathy:    She has no cervical adenopathy.  Neurological: She is alert and oriented to person, place, and time.  Skin: Skin is warm and dry.  Psychiatric: She has a normal mood and affect. Her behavior is normal.  Nursing note and vitals reviewed.  ED Treatments / Results  Labs (all labs ordered are listed, but only abnormal results are displayed) Labs Reviewed - No data to display  Radiology No results found.  Procedures Procedures (including critical care time)  DIAGNOSTIC STUDIES: Oxygen Saturation is 99% on RA, normal by my interpretation.    COORDINATION OF CARE: 5:02 PM Discussed treatment plan with pt at  bedside and pt agreed to plan.  Medications Ordered in ED Medications - No data to display   Initial Impression / Assessment and Plan / ED Course  I have reviewed the triage vital signs and the nursing notes.   Final Clinical Impressions(s) / ED Diagnoses  56 y.o. female with facial pain at the maxillary sinus area and subjective fever at home stable for d/c without headache, meningeal signs and does not appear toxic. Will treat for sinusitis and she will f/u with her PCP. She will return here as needed.  Final diagnoses:  Acute maxillary sinusitis, recurrence not specified    New Prescriptions New Prescriptions   AMOXICILLIN (AMOXIL) 500 MG CAPSULE    Take 1 capsule (500 mg total) by mouth 3 (three) times daily.  I personally performed the services described in this documentation, which was scribed in my presence. The recorded information has been reviewed and is accurate.    Debroah Baller [place], [place] 03/13/17 1717    Duffy Bruce, MD 03/14/17 [phone]

## 2017-03-20 ENCOUNTER — Ambulatory Visit (INDEPENDENT_AMBULATORY_CARE_PROVIDER_SITE_OTHER): Payer: Self-pay | Admitting: Specialist

## 2017-06-13 ENCOUNTER — Other Ambulatory Visit: Payer: Self-pay | Admitting: Family Medicine

## 2017-06-13 ENCOUNTER — Ambulatory Visit: Payer: Self-pay | Attending: Internal Medicine

## 2017-06-13 DIAGNOSIS — K219 Gastro-esophageal reflux disease without esophagitis: Secondary | ICD-10-CM

## 2017-06-13 MED FILL — glipiZIDE 10 MG TABS: 10 | 30 days supply | Qty: 60 | Fill #4

## 2017-06-13 MED FILL — ?METFORMIN HCL 1,000 MG TAB: 1000 | 30 days supply | Qty: 60 | Fill #4

## 2017-06-30 ENCOUNTER — Encounter (INDEPENDENT_AMBULATORY_CARE_PROVIDER_SITE_OTHER): Payer: Self-pay

## 2017-06-30 ENCOUNTER — Ambulatory Visit (INDEPENDENT_AMBULATORY_CARE_PROVIDER_SITE_OTHER): Payer: Self-pay | Admitting: Specialist

## 2017-07-15 MED FILL — methIMAzole 5 MG TABS: 5 | 30 days supply | Qty: 60 | Fill #0

## 2017-07-15 MED FILL — glipiZIDE 10 MG TABS: 10 | 30 days supply | Qty: 60 | Fill #5

## 2017-07-15 MED FILL — ?METFORMIN HCL 1,000 MG TAB: 1000 | 30 days supply | Qty: 60 | Fill #5

## 2017-07-15 MED FILL — ?OMEPRAZOLE DR 20 MG CAPSUL: 20 | 30 days supply | Qty: 30 | Fill #0

## 2017-07-24 ENCOUNTER — Ambulatory Visit: Payer: No Typology Code available for payment source | Admitting: Family Medicine

## 2017-08-14 ENCOUNTER — Ambulatory Visit (INDEPENDENT_AMBULATORY_CARE_PROVIDER_SITE_OTHER): Payer: Self-pay | Admitting: Family Medicine

## 2017-08-14 ENCOUNTER — Encounter: Payer: Self-pay | Admitting: Family Medicine

## 2017-08-14 VITALS — BP 136/62 | HR 85 | Temp 98.5°F | Resp 16 | Ht 66.0 in | Wt 166.0 lb

## 2017-08-14 DIAGNOSIS — E119 Type 2 diabetes mellitus without complications: Secondary | ICD-10-CM

## 2017-08-14 DIAGNOSIS — K0889 Other specified disorders of teeth and supporting structures: Secondary | ICD-10-CM

## 2017-08-14 DIAGNOSIS — E0590 Thyrotoxicosis, unspecified without thyrotoxic crisis or storm: Secondary | ICD-10-CM

## 2017-08-14 DIAGNOSIS — I10 Essential (primary) hypertension: Secondary | ICD-10-CM

## 2017-08-14 LAB — COMPLETE METABOLIC PANEL WITH GFR
AG Ratio: 1.3 (calc) (ref 1.0–2.5)
ALT: 19 U/L (ref 6–29)
AST: 14 U/L (ref 10–35)
Albumin: 4.1 g/dL (ref 3.6–5.1)
Alkaline phosphatase (APISO): 101 U/L (ref 33–130)
BUN/Creatinine Ratio: 43 (calc) — ABNORMAL HIGH (ref 6–22)
BUN: 17 mg/dL (ref 7–25)
CO2: 29 mmol/L (ref 20–32)
Calcium: 10.1 mg/dL (ref 8.6–10.4)
Chloride: 102 mmol/L (ref 98–110)
Creat: 0.4 mg/dL — ABNORMAL LOW (ref 0.50–1.05)
GFR, Est African American: 136 mL/min/{1.73_m2} (ref >=60)
GFR, Est Non African American: 117 mL/min/{1.73_m2} (ref >=60)
Globulin: 3.2 g/dL (calc) (ref 1.9–3.7)
Glucose, Bld: 99 mg/dL (ref 65–99)
Potassium: 4 mmol/L (ref 3.5–5.3)
Sodium: 141 mmol/L (ref 135–146)
Total Bilirubin: 0.5 mg/dL (ref 0.2–1.2)
Total Protein: 7.3 g/dL (ref 6.1–8.1)

## 2017-08-14 LAB — POCT GLYCOSYLATED HEMOGLOBIN (HGB A1C): Hemoglobin A1C: 9

## 2017-08-14 LAB — POCT URINALYSIS DIP (DEVICE)
Bilirubin Urine: NEGATIVE
Glucose, UA: NEGATIVE mg/dL
Hgb urine dipstick: NEGATIVE
Ketones, ur: NEGATIVE mg/dL
Leukocytes, UA: NEGATIVE
Nitrite: NEGATIVE
Protein, ur: NEGATIVE mg/dL
Specific Gravity, Urine: 1.02 (ref 1.005–1.030)
Urobilinogen, UA: 0.2 mg/dL (ref 0.0–1.0)
pH: 6 (ref 5.0–8.0)

## 2017-08-14 LAB — TSH: TSH: 0.01 m[IU]/L — ABNORMAL LOW

## 2017-08-14 MED ORDER — METFORMIN HCL 1000 MG PO TABS: 500.0000 mg | ORAL_TABLET | Freq: Two times a day (BID) | ORAL | 1 refills | Status: DC

## 2017-08-14 MED ORDER — GLIMEPIRIDE 4 MG PO TABS: 4.0000 mg | ORAL_TABLET | Freq: Every day | ORAL | 3 refills | Status: DC

## 2017-08-14 MED ORDER — METHIMAZOLE 5 MG PO TABS: 5.0000 mg | ORAL_TABLET | Freq: Two times a day (BID) | ORAL | 1 refills | Status: DC

## 2017-08-14 MED ORDER — AMLODIPINE BESYLATE 10 MG PO TABS: 10.0000 mg | ORAL_TABLET | Freq: Every day | ORAL | 5 refills | Status: DC

## 2017-08-14 MED ORDER — SITAGLIPTIN PHOSPHATE 25 MG PO TABS: 25.0000 mg | ORAL_TABLET | Freq: Every day | ORAL | 5 refills | Status: DC

## 2017-08-14 NOTE — Progress Notes (Signed)
Subjective:    Patient ID: Crystal [name], female    DOB: [birth year], 56 y.o.   MRN: [id number]  HPI.  Crystal [name], a 56 year old female with a history of type 2 diabetes mellitus, hypertension and hypothyroidism presents for follow-up of chronic conditions.  Patient has been lost to follow-up over the past 6-7 months.  She states that she has been in [place] for 3 months.  Her diabetes medications were changed while in [place].  She states that she became ill and it was attributed to anti diabetic medications.  She states that she has been taking metformin 500 mg and glimepiride 4 mg daily.  She is not following a carbohydrate modified diet and does not exercise routinely.  She denies polyuria, polydipsia, or polyphagia. Patient also has a history of hypertension.  She has been taking amlodipine 10 mg daily consistently.  She states that she took medication 20 minutes prior to arrival. Past Medical History:  Diagnosis Date  . Diabetes mellitus   . Hypertension   . Hyperthyroidism    Social History   Socioeconomic History  . Marital status: Married    Spouse name: Not on file  . Number of children: Not on file  . Years of education: Not on file  . Highest education level: Not on file  Social Needs  . Financial resource strain: Not on file  . Food insecurity - worry: Not on file  . Food insecurity - inability: Not on file  . Transportation needs - medical: Not on file  . Transportation needs - non-medical: Not on file  Occupational History  . Not on file  Tobacco Use  . Smoking status: Never Smoker  . Smokeless tobacco: Never Used  Substance and Sexual Activity  . Alcohol use: No  . Drug use: No  . Sexual activity: Not on file  Other Topics Concern  . Not on file  Social History Narrative  . Not on file   Allergies  Allergen Reactions  . Aspirin Nausea And Vomiting  . Tramadol     Upset stomach    Review of Systems  Constitutional: Negative.  Negative for fatigue and  weight loss.  HENT: Negative for sore throat.   Respiratory: Negative for choking and wheezing.   Cardiovascular: Negative.   Gastrointestinal: Positive for heartburn. Negative for abdominal pain, dysphagia, melena and nausea.       Heartburn  Endocrine: Negative.  Negative for cold intolerance, heat intolerance, polydipsia, polyphagia and polyuria.  Genitourinary: Negative.   Musculoskeletal: Negative for muscle weakness.  Skin: Negative.   Allergic/Immunologic: Negative for immunocompromised state.  Neurological: Negative.  Negative for weakness.  Hematological: Negative.   Psychiatric/Behavioral: Negative.        Objective:   Physical Exam  Constitutional: She is oriented to person, place, and time. She appears well-developed.  HENT:  Head: Normocephalic and atraumatic.  Right Ear: External ear normal.  Left Ear: External ear normal.  Mouth/Throat: Oropharynx is clear and moist.  Eyes: Conjunctivae and EOM are normal. Pupils are equal, round, and reactive to light.  Neck: Trachea normal, normal range of motion and full passive range of motion without pain. Neck supple. Normal carotid pulses present. No neck rigidity. No edema and normal range of motion present.  Cardiovascular: Normal rate, regular rhythm, normal heart sounds and intact distal pulses.  Pulmonary/Chest: Effort normal and breath sounds normal.  Abdominal: Soft. Bowel sounds are normal.  Neurological: She is alert and oriented to person, place, and time. She has  normal reflexes. No cranial nerve deficit. Coordination and gait normal.  Microfilament examination normal  Skin: Skin is warm and dry.  Psychiatric: She has a normal mood and affect. Her behavior is normal. Judgment and thought content normal.       BP 136/62 Comment: manually  Pulse 85   Temp 98.5 F (36.9 C) (Oral)   Resp 16   Ht 5\' 6"  (1.676 m)   Wt 166 lb (75.3 kg)   LMP 06/02/2013   SpO2 99%   BMI 26.79 kg/m  Assessment & Plan:  1. Type  2 diabetes mellitus without complication, without long-term current use of insulin (HCC) Hemoglobin A1c increased from 8.3-9.  Patient has not been taking medications as prescribed she self adjusted metformin to 500 mg daily due to GI upset. Review urinalysis no glucosuria present  - HgB A1c - glimepiride (AMARYL) 4 MG tablet; Take 1 tablet (4 mg total) daily before breakfast by mouth.  Dispense: 30 tablet; Refill: 3 - metFORMIN (GLUCOPHAGE) 1000 MG tablet; Take 0.5 tablets (500 mg total) 2 (two) times daily with a meal by mouth.  Dispense: 180 tablet; Refill: 1 - COMPLETE METABOLIC PANEL WITH GFR - sitaGLIPtin (JANUVIA) 25 MG tablet; Take 1 tablet (25 mg total) daily by mouth.  Dispense: 30 tablet; Refill: 5  2. Hyperthyroidism - methimazole (TAPAZOLE) 5 MG tablet; Take 1 tablet (5 mg total) 2 (two) times daily by mouth.  Dispense: 60 tablet; Refill: 1 - TSH  3. Pain, dental - Ambulatory referral to Dentistry  4.  Essential hypertension Blood pressure is at goal on current medication regimen.  No changes warranted on today. - Continue medication, monitor blood pressure at home. Continue DASH diet. Reminder to go to the ER if any CP, SOB, nausea, dizziness, severe HA, changes vision/speech, left arm numbness and tingling and jaw pain.    RTC: 2 months for follow-up of type 2 diabetes and hypertension.  We will follow-up by phone with any abnormal laboratory results    Crystal Pounds  MSN, FNP-C [facility] [address] [place], [place] [address] [phone]

## 2017-08-14 NOTE — Patient Instructions (Signed)
Your hemoglobin A1c is 9, which is above goal.  We will continue metformin 500 mg twice daily and glimepiride 4 mg daily.  I will add Januvia 25 mg daily.  [facility]  It should be available for pickup on tomorrow.  Continue a carbohydrate modified diet divided over 5-6 small meals, increase water intake and exercise 2-3 times per week for 15-30 minutes.  We will follow-up by phone with laboratory results.  Your A1C goal is less than 7. Your fasting blood sugar  Upon awakening goal is between 110-140.  Your LDL  (bad cholesterol goal is less than 100 Blood pressure goal is <140/90.  Recommend a lowfat, low carbohydrate diet divided over 5-6 small meals, increase water intake to 6-8 glasses, and 150 minutes per week of cardiovascular exercise.   Take your medications as prescribed Make sure that you are familiar with each one of your medications and what they are used to treat.  If you are unsure of medications, please bring to follow up Will send referral for eye exam  Please keep your scheduled follow up appointment.

## 2017-08-15 MED FILL — ?METFORMIN HCL 1,000 MG TAB: 1000 | 30 days supply | Qty: 30 | Fill #0

## 2017-08-15 MED FILL — AMLODIPINE BESYLATE 10 MG T: 10 | 30 days supply | Qty: 30 | Fill #0

## 2017-08-15 MED FILL — ?GLIMEPIRIDE 4 MG TABLET: 4 | 30 days supply | Qty: 30 | Fill #0

## 2017-08-15 MED FILL — JANUVIA 25 MG TABLET: 25 | 30 days supply | Qty: 30 | Fill #0

## 2017-08-15 MED FILL — methIMAzole 5 MG TABS: 5 | 30 days supply | Qty: 60 | Fill #0

## 2017-08-17 ENCOUNTER — Other Ambulatory Visit: Payer: Self-pay | Admitting: Family Medicine

## 2017-08-17 DIAGNOSIS — E0590 Thyrotoxicosis, unspecified without thyrotoxic crisis or storm: Secondary | ICD-10-CM

## 2017-08-19 ENCOUNTER — Telehealth: Payer: Self-pay

## 2017-08-19 NOTE — Telephone Encounter (Signed)
-----   Message from Dorena Dew, [place] sent at 08/17/2017  3:19 PM EST ----- Regarding: lab results TSH is decreased. Please ensure that patient restarted Methimazole, medication was sent in several days ago, Will recheck TSH in 6 weeks.   Thanks

## 2017-08-19 NOTE — Telephone Encounter (Signed)
Called using [employer] interpre[employer] ID [phone]. Spoke with patient advised that TSH was decreased and that she should start methimazole again and that it has been sent to pharmacy and we will recheck tsh level in 6 weeks. Patient verbalized understanding. Thanks!

## 2017-08-22 ENCOUNTER — Ambulatory Visit (INDEPENDENT_AMBULATORY_CARE_PROVIDER_SITE_OTHER): Payer: Self-pay | Admitting: Specialist

## 2017-08-22 ENCOUNTER — Encounter (INDEPENDENT_AMBULATORY_CARE_PROVIDER_SITE_OTHER): Payer: Self-pay | Admitting: Specialist

## 2017-08-22 ENCOUNTER — Ambulatory Visit (INDEPENDENT_AMBULATORY_CARE_PROVIDER_SITE_OTHER): Payer: Self-pay

## 2017-08-22 VITALS — BP 127/74 | HR 85 | Ht 66.0 in | Wt 171.0 lb

## 2017-08-22 DIAGNOSIS — M25511 Pain in right shoulder: Secondary | ICD-10-CM

## 2017-08-22 DIAGNOSIS — S139XXD Sprain of joints and ligaments of unspecified parts of neck, subsequent encounter: Secondary | ICD-10-CM

## 2017-08-22 DIAGNOSIS — S46911A Strain of unspecified muscle, fascia and tendon at shoulder and upper arm level, right arm, initial encounter: Secondary | ICD-10-CM

## 2017-08-22 MED ORDER — METAXALONE 800 MG PO TABS: 800.0000 mg | ORAL_TABLET | Freq: Three times a day (TID) | ORAL | 0 refills | Status: DC

## 2017-08-22 MED FILL — METAXALONE 800 MG TABLET: 800 | 10 days supply | Qty: 30 | Fill #0

## 2017-08-22 NOTE — Patient Instructions (Signed)
Avoid overhead lifting and overhead use of the arms. Do not lift greater than 5 lbs. Adjust head rest in vehicle to prevent hyperextension if rear ended. Avoid overhead lifting and overhead use of the arms. Do not lift greater than 10 lbs. Tylenol ES one every 6-8 hours for pain and inflamation. Continue with PT for another 2-3 weeks, then a home exercise program.
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
## 2017-09-09 ENCOUNTER — Ambulatory Visit: Payer: Self-pay | Attending: Specialist | Admitting: Rehabilitation

## 2017-09-09 ENCOUNTER — Encounter: Payer: Self-pay | Admitting: Rehabilitation

## 2017-09-09 DIAGNOSIS — M6281 Muscle weakness (generalized): Secondary | ICD-10-CM | POA: Insufficient documentation

## 2017-09-09 DIAGNOSIS — G8929 Other chronic pain: Secondary | ICD-10-CM | POA: Insufficient documentation

## 2017-09-09 DIAGNOSIS — R293 Abnormal posture: Secondary | ICD-10-CM | POA: Insufficient documentation

## 2017-09-09 DIAGNOSIS — M25511 Pain in right shoulder: Secondary | ICD-10-CM | POA: Insufficient documentation

## 2017-09-09 DIAGNOSIS — M542 Cervicalgia: Secondary | ICD-10-CM | POA: Insufficient documentation
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
## 2017-09-10 ENCOUNTER — Ambulatory Visit: Payer: Self-pay | Admitting: Physical Therapy

## 2017-09-10 ENCOUNTER — Encounter: Payer: Self-pay | Admitting: Physical Therapy

## 2017-09-10 DIAGNOSIS — G8929 Other chronic pain: Secondary | ICD-10-CM

## 2017-09-10 DIAGNOSIS — M542 Cervicalgia: Secondary | ICD-10-CM

## 2017-09-10 DIAGNOSIS — M6281 Muscle weakness (generalized): Secondary | ICD-10-CM

## 2017-09-10 DIAGNOSIS — M25511 Pain in right shoulder: Secondary | ICD-10-CM

## 2017-09-10 DIAGNOSIS — R293 Abnormal posture: Secondary | ICD-10-CM

## 2017-09-10 NOTE — Therapy (Signed)
[place], [place], [address] Phone: [phone]   Fax:  [phone]  Physical Therapy Treatment  Patient Details  Name: Crystal [name] MRN: [id number] Date of Birth: Apr 12, 1961 Referring Provider: Basil Dess MD   Encounter Date: 09/10/2017  PT End of Session - 09/10/17 1239    Visit Number  2    Number of Visits  12    Date for PT Re-Evaluation  10/21/17    Authorization Type  CAFA 100%    PT Start Time  0940    PT Stop Time  1025    PT Time Calculation (min)  45 min    Activity Tolerance  Patient tolerated treatment well    Behavior During Therapy  [facility] for tasks assessed/performed       Past Medical History:  Diagnosis Date  . Diabetes mellitus   . Hypertension   . Hyperthyroidism     Past Surgical History:  Procedure Laterality Date  . ANTERIOR CERVICAL DECOMP/DISCECTOMY FUSION N/A 10/11/2016   Procedure: ANTERIOR CERVICAL DISCECTOMY FUSION C6-7 WITH EXCISION OF OSTEOPHYTE;  Surgeon: [name], MD;  Location: [place];  Service: Orthopedics;  Laterality: N/A;  . CESAREAN SECTION    . TUBAL LIGATION      There were no vitals filed for this visit.  Subjective Assessment - 09/10/17 0946    Subjective  Pain comes and goes.  Low back hurting on Rt. side now.         [facility] Adult PT Treatment/Exercise - 09/10/17 0001      Neck Exercises: Supine   Neck Retraction  10 reps;5 secs    Other Supine Exercise  horiz abd red x 10 and over head for scapular stab.       Moist Heat Therapy   Number Minutes Moist Heat  10 Minutes    Moist Heat Location  Shoulder R      Manual Therapy   Manual Therapy  Soft tissue mobilization;Myofascial release;Passive ROM    Manual therapy comments  painful trigger points noted throughout    Soft tissue mobilization  gentle post cervicals Rt. side     Myofascial Release  neck , Rt shouder    Passive ROM  rotation and sidebending gentle       Neck Exercises:  Stretches   Upper Trapezius Stretch  2 reps;30 seconds    Levator Stretch  2 reps;30 seconds    Lower Cervical/Upper Thoracic Stretch  2 reps;30 seconds    Other Neck Stretches  cervical rotation              PT Education - 09/10/17 1238    Education provided  Yes    Education Details  HEP, cues for technique    Person(s) Educated  Patient    Methods  Explanation    Comprehension  Verbalized understanding;Returned demonstration          PT Long Term Goals - 09/09/17 1439      PT LONG TERM GOAL #1   Title  Pt will improve cervical AROM to WNL and painfree    Time  6    Period  Weeks    Status  New    Target Date  10/21/17      PT LONG TERM GOAL #2   Title  Pt will improve R shoulder AROM to WNL and painfree    Time  6    Period  Weeks    Status  New  Target Date  10/21/17      PT LONG TERM GOAL #3   Title  Pt will decrease night pain to 4/10 or less    Time  6    Period  Weeks    Status  New    Target Date  10/21/17      PT LONG TERM GOAL #4   Title  Pt will centralize R UE pain to no pain in the UE    Time  6    Period  Weeks    Status  New    Target Date  10/21/17      PT LONG TERM GOAL #5   Title  Pt will report no difficulty with ADLs due to the UE    Time  6    Period  Weeks    Status  New    Target Date  10/21/17            Plan - 09/10/17 1240    Clinical Impression Statement  Patient with higher levels of pain but able to do all exercises without pain/distress.  She has difficulty relaxing her head/neck in supine.  Multiple tender, trigger points in Rt. neck, shoulder.  Pain muscular and spread into her trunk today, lower thoracic. 2 nd visit.  No goals met.     PT Next Visit Plan  review HEP, manual to cervical and R scapular region, R shoulder ROM, modalities PRN    PT Home Exercise Plan  cervical stretches    Consulted and Agree with Plan of Care  Patient       Patient will benefit from skilled therapeutic intervention in order  to improve the following deficits and impairments:     Visit Diagnosis: Neck pain  Chronic right shoulder pain  Abnormal posture  Muscle weakness (generalized)     Problem List Patient Active Problem List   Diagnosis Date Noted  . Spinal stenosis of cervical region 10/11/2016    Class: Chronic  . Cervical spinal stenosis 10/11/2016  . Hypertension 09/09/2016  . Diabetes mellitus ([place]) 09/09/2016  . Multiple thyroid nodules 01/27/2013  . Disorder of thyroid 07/07/2007  . VARICOSE VEIN 07/07/2007  . GERD 07/07/2007    PAA,JENNIFER 09/10/2017, 12:50 PM  [facility] [address] [place], [place], [address] Phone: [phone]   Fax:  [phone]  Name: Jackson Fetters MRN: [id number] Date of Birth: November 14, 1960   Raeford Razor, PT 09/10/17 12:51 PM Phone: [phone] Fax: [phone]

## 2017-09-12 ENCOUNTER — Ambulatory Visit: Payer: Self-pay | Attending: Family Medicine

## 2017-09-18 ENCOUNTER — Ambulatory Visit: Payer: Self-pay

## 2017-09-18 DIAGNOSIS — M6281 Muscle weakness (generalized): Secondary | ICD-10-CM

## 2017-09-18 DIAGNOSIS — R293 Abnormal posture: Secondary | ICD-10-CM

## 2017-09-18 DIAGNOSIS — G8929 Other chronic pain: Secondary | ICD-10-CM

## 2017-09-18 DIAGNOSIS — M542 Cervicalgia: Secondary | ICD-10-CM

## 2017-09-18 DIAGNOSIS — M25511 Pain in right shoulder: Secondary | ICD-10-CM

## 2017-09-18 NOTE — Therapy (Signed)
[place], [place], [address] Phone: [phone]   Fax:  [phone]  Physical Therapy Treatment  Patient Details  Name: Crystal [name] MRN: [id number] Date of Birth: 03-May-1961 Referring Provider: Basil Dess MD   Encounter Date: 09/18/2017  PT End of Session - 09/18/17 1140    Visit Number  3    Number of Visits  12    Date for PT Re-Evaluation  10/21/17    Authorization Type  CAFA 100%    PT Start Time  1100    PT Stop Time  1150    PT Time Calculation (min)  50 min    Activity Tolerance  Patient tolerated treatment well;No increased pain    Behavior During Therapy  W[facility] for tasks assessed/performed       Past Medical History:  Diagnosis Date  . Diabetes mellitus   . Hypertension   . Hyperthyroidism     Past Surgical History:  Procedure Laterality Date  . ANTERIOR CERVICAL DECOMP/DISCECTOMY FUSION N/A 10/11/2016   Procedure: ANTERIOR CERVICAL DISCECTOMY FUSION C6-7 WITH EXCISION OF OSTEOPHYTE;  Surgeon: [name], MD;  Location: [place];  Service: Orthopedics;  Laterality: N/A;  . CESAREAN SECTION    . TUBAL LIGATION      There were no vitals filed for this visit.  Subjective Assessment - 09/18/17 1108    Subjective  She reports feeling better than before . Varies by day.    Pertinent History  ANTERIOR CERVICAL DISCECTOMY FUSION C6-7 WITH EXCISION OF OSTEOPHYTE    Currently in Pain?  Yes    Pain Score  6     Pain Location  Neck and shoulder    Pain Orientation  Right    Pain Descriptors / Indicators  Tightness    Pain Type  Chronic pain    Pain Onset  More than a month ago    Pain Frequency  Intermittent                      [facility] Adult PT Treatment/Exercise - 09/18/17 0001      Neck Exercises: Seated   Other Seated Exercise  She was able to demo all HEP /ROM exercise correctly.  She indicated she was doing some band exercises though this was not in Plan HEP space      Moist Heat Therapy   Number Minutes Moist Heat  12 Minutes    Moist Heat Location  Shoulder;Cervical      Manual Therapy   Manual Therapy  Joint mobilization    Manual therapy comments  painful trigger points noted more upper to mid thoracic spine on RT     Joint Mobilization  PA Gr 3-4 RT and LT  transverse process RT> LT and on RT with deep breathing and exhale full. prone and seated trunk rotation with pressure to RT mid thoracic spine sustained with LT rotation.     Soft tissue mobilization  STW to RT trap scalene, levator and paraspinals  with tool    Passive ROM  rotation and sidebending gentle       Neck Exercises: Stretches   Upper Trapezius Stretch  1 rep;30 seconds RT/LT    Levator Stretch  1 rep;30 seconds RT/LT     Lower Cervical/Upper Thoracic Stretch  1 rep;30 seconds                  PT Long Term Goals - 09/18/17 1112  PT LONG TERM GOAL #1   Title  Pt will improve cervical AROM to WNL and painfree    Baseline  all WNL except extension today. pain with extension    Status  Partially Met      PT LONG TERM GOAL #2   Title  Pt will improve R shoulder AROM to WNL and painfree    Baseline  Ful ROm but some pain she reports better    Status  Partially Met      PT LONG TERM GOAL #3   Title  Pt will decrease night pain to 4/10 or less    Status  On-going      PT LONG TERM GOAL #4   Title  Pt will centralize R UE pain to no pain in the UE    Baseline  last pain into RT arm yesterday.     Status  On-going      PT LONG TERM GOAL #5   Title  Pt will report no difficulty with ADLs due to the UE    Baseline  difficulty holding  objects. Varies by day    Status  On-going            Plan - 09/18/17 1141    Clinical Impression Statement  She reported she felt better after session. Her motion is free and WNL except end rang=e extension with RT neck pain    PT Treatment/Interventions  Electrical Stimulation;Moist Heat;Therapeutic  exercise;Traction;Therapeutic activities;Neuromuscular re-education;Patient/family education;Manual techniques;Dry needling;Passive range of motion    PT Next Visit Plan  , manual to cervical and R scapular region, R shoulder ROM, modalities PRN ? progress strength exercises    PT Home Exercise Plan  cervical stretches    Consulted and Agree with Plan of Care  Patient       Patient will benefit from skilled therapeutic intervention in order to improve the following deficits and impairments:  Decreased range of motion, Impaired flexibility, Pain, Impaired UE functional use  Visit Diagnosis: Neck pain  Abnormal posture  Muscle weakness (generalized)  Chronic right shoulder pain     Problem List Patient Active Problem List   Diagnosis Date Noted  . Spinal stenosis of cervical region 10/11/2016    Class: Chronic  . Cervical spinal stenosis 10/11/2016  . Hypertension 09/09/2016  . Diabetes mellitus ([place]) 09/09/2016  . Multiple thyroid nodules 01/27/2013  . Disorder of thyroid 07/07/2007  . VARICOSE VEIN 07/07/2007  . GERD 07/07/2007    Darrel Hoover  PT 09/18/2017, 11:45 AM  [facility] [address] [place], [place], [address] Phone: [phone]   Fax:  [phone]  Name: Jonni Oelkers MRN: [id number] Date of Birth: 1960-10-20

## 2017-09-22 ENCOUNTER — Ambulatory Visit: Payer: Self-pay

## 2017-09-22 DIAGNOSIS — M6281 Muscle weakness (generalized): Secondary | ICD-10-CM

## 2017-09-22 DIAGNOSIS — M25511 Pain in right shoulder: Secondary | ICD-10-CM

## 2017-09-22 DIAGNOSIS — M542 Cervicalgia: Secondary | ICD-10-CM

## 2017-09-22 DIAGNOSIS — G8929 Other chronic pain: Secondary | ICD-10-CM

## 2017-09-22 DIAGNOSIS — R293 Abnormal posture: Secondary | ICD-10-CM

## 2017-09-22 NOTE — Therapy (Signed)
[place], [place], [address] Phone: [phone]   Fax:  [phone]  Physical Therapy Treatment  Patient Details  Name: Crystal [name] MRN: [id number] Date of Birth: [birth year] Referring Provider: Basil Dess MD   Encounter Date: 09/22/2017  PT End of Session - 09/22/17 1446    Visit Number  4    Number of Visits  12    Date for PT Re-Evaluation  10/21/17    Authorization Type  CAFA 100%    PT Start Time  0222    PT Stop Time  0305    PT Time Calculation (min)  43 min    Activity Tolerance  Patient tolerated treatment well;No increased pain    Behavior During Therapy  WFL for tasks assessed/performed       Past Medical History:  Diagnosis Date  . Diabetes mellitus   . Hypertension   . Hyperthyroidism     Past Surgical History:  Procedure Laterality Date  . ANTERIOR CERVICAL DECOMP/DISCECTOMY FUSION N/A 10/11/2016   Procedure: ANTERIOR CERVICAL DISCECTOMY FUSION C6-7 WITH EXCISION OF OSTEOPHYTE;  Surgeon: [name], MD;  Location: [facility];  Service: Orthopedics;  Laterality: N/A;  . CESAREAN SECTION    . TUBAL LIGATION      There were no vitals filed for this visit.  Subjective Assessment - 09/22/17 1423    Subjective  No pain today , yesterday I had pain.     Currently in Pain?  No/denies         [facility] PT Assessment - 09/22/17 0001      AROM   Overall AROM Comments  RT and Lt shoulder motion WNL  a twinge o Rt trap pain.     Cervical Flexion  65    Cervical Extension  70  end range pain    Cervical - Right Side Bend  25    Cervical - Left Side Bend  45    Cervical - Right Rotation  68    Cervical - Left Rotation  75      Strength   Overall Strength Comments  Normal UE without pain.                   [facility] Adult PT Treatment/Exercise - 09/22/17 0001      Manual Therapy   Joint Mobilization  PA Gr 3-4 RT and LT  transverse process RT> LT and on RT with deep breathing  and exhale full. prone and seated trunk rotation with pressure to RT mid thoracic spine sustained with LT rotation.     Soft tissue mobilization  STW to RT trap scalene, levator and paraspinals  with tool    Passive ROM  rotation and sidebending gentle                   PT Long Term Goals - 09/22/17 1448      PT LONG TERM GOAL #1   Title  Pt will improve cervical AROM to WNL and painfree            Plan - 09/22/17 1447    Clinical Impression Statement  She is much improved with normal strength without pain and ROM that may be normal for her as she has had a ACDF. Range is functional and only limited with RT sidebbending and rotation       Patient will benefit from skilled therapeutic intervention in order to improve the following deficits and impairments:  Visit Diagnosis: Neck pain  Abnormal posture  Muscle weakness (generalized)  Chronic right shoulder pain     Problem List Patient Active Problem List   Diagnosis Date Noted  . Spinal stenosis of cervical region 10/11/2016    Class: Chronic  . Cervical spinal stenosis 10/11/2016  . Hypertension 09/09/2016  . Diabetes mellitus ([place]) 09/09/2016  . Multiple thyroid nodules 01/27/2013  . Disorder of thyroid 07/07/2007  . VARICOSE VEIN 07/07/2007  . GERD 07/07/2007    Darrel Hoover  PT 09/22/2017, 2:50 PM  [facility] [address] [place], [place], [address] Phone: [phone]   Fax:  [phone]  Name: Crystal [name] MRN: [id number] Date of Birth: [birth year]

## 2017-09-25 ENCOUNTER — Ambulatory Visit: Payer: Self-pay

## 2017-09-25 DIAGNOSIS — G8929 Other chronic pain: Secondary | ICD-10-CM

## 2017-09-25 DIAGNOSIS — R293 Abnormal posture: Secondary | ICD-10-CM

## 2017-09-25 DIAGNOSIS — M6281 Muscle weakness (generalized): Secondary | ICD-10-CM

## 2017-09-25 DIAGNOSIS — M25511 Pain in right shoulder: Secondary | ICD-10-CM

## 2017-09-25 DIAGNOSIS — M542 Cervicalgia: Secondary | ICD-10-CM

## 2017-09-25 NOTE — Patient Instructions (Signed)
Issued from cabinet band row and shoulder extension and ER and abduction behind back10-15 reps 1-2x/day

## 2017-09-25 NOTE — Therapy (Signed)
[place], [place], [address] Phone: [phone]   Fax:  [phone]  Physical Therapy Treatment  Patient Details  Name: Crystal [name] MRN: [id number] Date of Birth: October 24, 1960 Referring Provider: Basil Dess MD   Encounter Date: 09/25/2017  PT End of Session - 09/25/17 1544    Visit Number  5    Number of Visits  12    Date for PT Re-Evaluation  10/21/17    Authorization Type  CAFA 100%    PT Start Time  0314    PT Stop Time  0358    PT Time Calculation (min)  44 min    Activity Tolerance  Patient tolerated treatment well;No increased pain    Behavior During Therapy  WFL for tasks assessed/performed       Past Medical History:  Diagnosis Date  . Diabetes mellitus   . Hypertension   . Hyperthyroidism     Past Surgical History:  Procedure Laterality Date  . ANTERIOR CERVICAL DECOMP/DISCECTOMY FUSION N/A 10/11/2016   Procedure: ANTERIOR CERVICAL DISCECTOMY FUSION C6-7 WITH EXCISION OF OSTEOPHYTE;  Surgeon: [name], MD;  Location: [facility];  Service: Orthopedics;  Laterality: N/A;  . CESAREAN SECTION    . TUBAL LIGATION      There were no vitals filed for this visit.  Subjective Assessment - 09/25/17 1515    Subjective  No pain today , yesterday I had pain.     Patient is accompained by:  Interpreter    Currently in Pain?  No/denies                      [facility] Adult PT Treatment/Exercise - 09/25/17 0001      Neck Exercises: Seated   Other Seated Exercise  isometrics x 8 3 sec 6 way cervical       Moist Heat Therapy   Number Minutes Moist Heat  12 Minutes    Moist Heat Location  Shoulder;Cervical      Manual Therapy   Soft tissue mobilization  STW to RT trap scalene, levator and paraspinals  with tool    Passive ROM  rotation and sidebending gentle full ROM       standing red band exercises for home row/ER/shoulder extension / behind back abduction      PT  Education - 09/25/17 1550    Education provided  Yes    Education Details  band exercises    Person(s) Educated  Patient    Methods  Explanation;Demonstration;Tactile cues;Verbal cues;Handout    Comprehension  Returned demonstration;Verbalized understanding          PT Long Term Goals - 09/25/17 1547      PT LONG TERM GOAL #1   Title  Pt will improve cervical AROM to WNL and painfree    Baseline  all WNL except extension today. pain with extension      PT LONG TERM GOAL #2   Title  Pt will improve R shoulder AROM to WNL and painfree    Baseline  Ful ROm but some pain she reports better    Status  Partially Met      PT LONG TERM GOAL #3   Title  Pt will decrease night pain to 4/10 or less    Baseline  varues    Status  On-going      PT LONG TERM GOAL #4   Title  Pt will centralize R UE pain to no pain  in the UE    Baseline  varies by day    Status  On-going      PT LONG TERM GOAL #5   Title  Pt will report no difficulty with ADLs due to the UE    Baseline  difficulty holding  objects. Varies by day    Status  On-going            Plan - 09/25/17 1545    Clinical Impression Statement  Continued improved with no pain and essentiall normal ROm.  She was able to doi band exerc without pain . Will give isometrics for home next visit    PT Treatment/Interventions  Electrical Stimulation;Moist Heat;Therapeutic exercise;Traction;Therapeutic activities;Neuromuscular re-education;Patient/family education;Manual techniques;Dry needling;Passive range of motion    PT Next Visit Plan  , manual to cervical and R scapular region, R shoulder ROM, modalities PRN ? progress strength exercises, cervical isometrics    PT Home Exercise Plan  cervical stretches, exer with red band issued for row, shouder ext, ER /abduction behind back    Consulted and Agree with Plan of Care  Patient       Patient will benefit from skilled therapeutic intervention in order to improve the following  deficits and impairments:  Decreased range of motion, Impaired flexibility, Pain, Impaired UE functional use  Visit Diagnosis: Neck pain  Abnormal posture  Muscle weakness (generalized)  Chronic right shoulder pain     Problem List Patient Active Problem List   Diagnosis Date Noted  . Spinal stenosis of cervical region 10/11/2016    Class: Chronic  . Cervical spinal stenosis 10/11/2016  . Hypertension 09/09/2016  . Diabetes mellitus ([place]) 09/09/2016  . Multiple thyroid nodules 01/27/2013  . Disorder of thyroid 07/07/2007  . VARICOSE VEIN 07/07/2007  . GERD 07/07/2007    Darrel Hoover  PT 09/25/2017, 3:51 PM  [facility] [address] [place], [place], [address] Phone: [phone]   Fax:  [phone]  Name: Niaja Stickley MRN: [id number] Date of Birth: Apr 29, 1961

## 2017-10-01 ENCOUNTER — Ambulatory Visit: Payer: Self-pay | Admitting: Physical Therapy

## 2017-10-01 ENCOUNTER — Encounter: Payer: Self-pay | Admitting: Physical Therapy

## 2017-10-01 DIAGNOSIS — M6281 Muscle weakness (generalized): Secondary | ICD-10-CM

## 2017-10-01 DIAGNOSIS — R293 Abnormal posture: Secondary | ICD-10-CM

## 2017-10-01 DIAGNOSIS — G8929 Other chronic pain: Secondary | ICD-10-CM

## 2017-10-01 DIAGNOSIS — M542 Cervicalgia: Secondary | ICD-10-CM

## 2017-10-01 DIAGNOSIS — M25511 Pain in right shoulder: Secondary | ICD-10-CM

## 2017-10-01 NOTE — Therapy (Signed)
[place], [place], [address] Phone: [phone]   Fax:  [phone]  Physical Therapy Treatment  Patient Details  Name: Crystal [name] MRN: [id number] Date of Birth: 1961-07-30 Referring Provider: Basil Dess MD   Encounter Date: 10/01/2017  PT End of Session - 10/01/17 1524    Visit Number  6    Number of Visits  12    Date for PT Re-Evaluation  10/21/17    PT Start Time  1336    PT Stop Time  1439    PT Time Calculation (min)  63 min       Past Medical History:  Diagnosis Date  . Diabetes mellitus   . Hypertension   . Hyperthyroidism     Past Surgical History:  Procedure Laterality Date  . ANTERIOR CERVICAL DECOMP/DISCECTOMY FUSION N/A 10/11/2016   Procedure: ANTERIOR CERVICAL DISCECTOMY FUSION C6-7 WITH EXCISION OF OSTEOPHYTE;  Surgeon: [name], MD;  Location: [facility];  Service: Orthopedics;  Laterality: N/A;  . CESAREAN SECTION    . TUBAL LIGATION      There were no vitals filed for this visit.  Subjective Assessment - 10/01/17 1341    Subjective  i DO THE EXERCISES.    Exercises help.  i felt good for 2 days after last session , then pain returned with cleaning, (Demo reaching to floor)    Patient is accompained by:  Interpreter    Currently in Pain?  Yes    Pain Score  4  yesterday is was 9/10    Pain Location  Neck    Pain Orientation  Right    Pain Descriptors / Indicators  Tightness;Sharp lik an injection    Pain Radiating Towards  into arm,   down body to waist right    Pain Frequency  Intermittent    Aggravating Factors   sleeping ,  cleaning    Pain Relieving Factors  tylenol,  ice heat    Effect of Pain on Daily Activities  dl LIMITED         [facility] PT Assessment - 10/01/17 0001      AROM   Right Shoulder ABduction  134 Degrees                  [facility] Adult PT Treatment/Exercise - 10/01/17 0001      Self-Care   Self-Care  ADL's;Posture;Other Self-Care  Comments    ADL's  handiut issued and reviewed cleaning,  verbally,  husband does the laundry.    Posture  practiced sitting with lumbar support,  practiced sleeping in supine and  on right with pillows and nec roll,  able to find a comfortable position.  Neck muscles completely relaxed while in bed.      Neck Exercises: Seated   Other Seated Exercise  sitting trunk rotations  to each side 5 X starting at the hips.  This decreased pain along flank    Other Seated Exercise  isometrics x 3 3 sec 6 way cervical ,  added to HEP      Moist Heat Therapy   Number Minutes Moist Heat  20 Minutes    Moist Heat Location  Shoulder;Cervical      Manual Therapy   Soft tissue mobilization  right upper trap,  peri scapular and right PECS superior    Passive ROM  PECS post manual             PT Education - 10/01/17 1400  Education provided  Yes    Education Details  HEP,  ADL    Person(s) Educated  Patient    Methods  Explanation;Demonstration;Verbal cues;Handout    Comprehension  Returned demonstration;Need further instruction          PT Long Term Goals - 10/01/17 1534      PT LONG TERM GOAL #1   Title  Pt will improve cervical AROM to WNL and painfree    Time  6    Status  Unable to assess      PT LONG TERM GOAL #2   Title  Pt will improve R shoulder AROM to WNL and painfree    Baseline  134 AROM abduction right , limited by pain    Time  6    Period  Weeks    Status  Partially Met      PT LONG TERM GOAL #3   Title  Pt will decrease night pain to 4/10 or less    Baseline  up to 9/10    Time  6    Period  Weeks    Status  On-going      PT LONG TERM GOAL #4   Title  Pt will centralize R UE pain to no pain in the UE    Baseline  varies by day    Time  6    Status  On-going      PT LONG TERM GOAL #5   Title  Pt will report no difficulty with ADLs due to the UE    Baseline  difficult to clean    Time  6    Period  Weeks    Status  On-going            Plan -  10/01/17 1529    Clinical Impression Statement  Able to issue Isometrics for home,  also able to continue education to help improve pain with sitting and sleeping.  There was no palpatable tight in upper trap / levator in supine with support.    No pain increased at end of session.   134 abduction AROM right.   She had a pain flare yesterday with pain 9/10.  No new goals met,  progress toward ROM goals.     PT Next Visit Plan  , manual to cervical and R scapular region, R shoulder ROM, modalities PRN ? progress strength exercises,  review  cervical isometrics    PT Home Exercise Plan  cervical stretches, exer with red band issued for row, shouder ext, ER /abduction behind back       Patient will benefit from skilled therapeutic intervention in order to improve the following deficits and impairments:     Visit Diagnosis: Neck pain  Abnormal posture  Muscle weakness (generalized)  Chronic right shoulder pain     Problem List Patient Active Problem List   Diagnosis Date Noted  . Spinal stenosis of cervical region 10/11/2016    Class: Chronic  . Cervical spinal stenosis 10/11/2016  . Hypertension 09/09/2016  . Diabetes mellitus ([place]) 09/09/2016  . Multiple thyroid nodules 01/27/2013  . Disorder of thyroid 07/07/2007  . VARICOSE VEIN 07/07/2007  . GERD 07/07/2007    [name] PTA 10/01/2017, 3:37 PM  [facility] [address] [place], [place], [address] Phone: [phone]   Fax:  [phone]  Name: Crystal [name] MRN: [id number] Date of Birth: 06-28-1961

## 2017-10-01 NOTE — Patient Instructions (Addendum)

## 2017-10-02 ENCOUNTER — Ambulatory Visit (INDEPENDENT_AMBULATORY_CARE_PROVIDER_SITE_OTHER): Payer: Self-pay | Admitting: Specialist

## 2017-10-02 ENCOUNTER — Encounter (INDEPENDENT_AMBULATORY_CARE_PROVIDER_SITE_OTHER): Payer: Self-pay | Admitting: Specialist

## 2017-10-02 VITALS — BP 157/89 | HR 89 | Ht 66.0 in | Wt 171.0 lb

## 2017-10-02 DIAGNOSIS — M7551 Bursitis of right shoulder: Secondary | ICD-10-CM

## 2017-10-02 DIAGNOSIS — Z981 Arthrodesis status: Secondary | ICD-10-CM

## 2017-10-02 NOTE — Patient Instructions (Signed)
Avoid overhead lifting and overhead use of the arms. Do not lift greater than 10 lbs. Tylenol ES one every 6-8 hours for pain and inflamation.  Continue with PT for another 3 weeks, then a home exercise program.

## 2017-10-02 NOTE — Progress Notes (Signed)
Office Visit Note   Patient: Crystal [name]           Date of Birth: 26-Jun-1961           MRN: [id number] Visit Date: 10/02/2017              Requested by: Dorena Dew, FNP [address], [place] [address] PCP: Dorena Dew, FNP   Assessment & Plan: Visit Diagnoses:  1. Chronic shoulder bursitis, right   2. Status post cervical spinal fusion     Plan: Avoid overhead lifting and overhead use of the arms. Do not lift greater than 10 lbs. Tylenol ES one every 6-8 hours for pain and inflamation.  Continue with PT for another 3 weeks, then a home exercise program.  Follow-Up Instructions: Return in about 4 weeks (around 10/30/2017).   Orders:  No orders of the defined types were placed in this encounter.  No orders of the defined types were placed in this encounter.     Procedures: No procedures performed   Clinical Data: No additional findings.   Subjective: Chief Complaint  Patient presents with  . Right Shoulder - Follow-up  . Neck - Follow-up    56 year old female with history of previous ACDF C6-7 she is experiencing right shoulder pain with ROM and use overhead. Seen and therapy initiated. She notes gradulal improvement in the  Right shoulder pain, still unable to sleep on the right shoulder. No pins or needles but severe pain right anterolateral upper arm in the area of the right deltoid.     Review of Systems  Constitutional: Negative.   HENT: Negative.   Eyes: Negative.   Respiratory: Negative.   Cardiovascular: Negative.   Gastrointestinal: Negative.   Endocrine: Negative.   Genitourinary: Negative.   Musculoskeletal: Negative.   Skin: Negative.   Allergic/Immunologic: Negative.   Neurological: Negative.   Hematological: Negative.   Psychiatric/Behavioral: Negative.      Objective: Vital Signs: BP (!) 157/89 (BP Location: Left Arm, Patient Position: Sitting, Cuff Size: Normal)   Pulse 89   Ht 5\' 6"  (1.676 m)   Wt  171 lb (77.6 kg)   LMP 06/02/2013   SpO2 99%   BMI 27.60 kg/m   Physical Exam  Constitutional: She is oriented to person, place, and time. She appears well-developed and well-nourished.  HENT:  Head: Normocephalic and atraumatic.  Eyes: EOM are normal. Pupils are equal, round, and reactive to light.  Neck: Normal range of motion. Neck supple.  Pulmonary/Chest: Effort normal and breath sounds normal.  Abdominal: Soft. Bowel sounds are normal.  Neurological: She is alert and oriented to person, place, and time.  Skin: Skin is warm and dry.  Psychiatric: She has a normal mood and affect. Her behavior is normal. Judgment and thought content normal.    Right Shoulder Exam   Tenderness  The patient is experiencing tenderness in the acromion.  Range of Motion  Active abduction:  130 abnormal  Passive abduction: 160  Extension: 30  External rotation: 50  Forward flexion: 120  Internal rotation 0 degrees: abnormal  Internal rotation 90 degrees: abnormal   Tests  Apprehension: positive Cross arm: positive Impingement: positive  Other  Erythema: absent Scars: absent Sensation: normal Pulse: present      Specialty Comments:  No specialty comments available.  Imaging: No results found.   PMFS History: Patient Active Problem List   Diagnosis Date Noted  . Spinal stenosis of cervical region  10/11/2016    Priority: High    Class: Chronic  . Cervical spinal stenosis 10/11/2016  . Hypertension 09/09/2016  . Diabetes mellitus ([place]) 09/09/2016  . Multiple thyroid nodules 01/27/2013  . Disorder of thyroid 07/07/2007  . VARICOSE VEIN 07/07/2007  . GERD 07/07/2007   Past Medical History:  Diagnosis Date  . Diabetes mellitus   . Hypertension   . Hyperthyroidism     History reviewed. No pertinent family history.  Past Surgical History:  Procedure Laterality Date  . ANTERIOR CERVICAL DECOMP/DISCECTOMY FUSION N/A 10/11/2016   Procedure: ANTERIOR CERVICAL DISCECTOMY  FUSION C6-7 WITH EXCISION OF OSTEOPHYTE;  Surgeon: [name], MD;  Location: [facility];  Service: Orthopedics;  Laterality: N/A;  . CESAREAN SECTION    . TUBAL LIGATION     Social History   Occupational History  . Not on file  Tobacco Use  . Smoking status: Never Smoker  . Smokeless tobacco: Never Used  Substance and Sexual Activity  . Alcohol use: No  . Drug use: No  . Sexual activity: Not on file

## 2017-10-09 ENCOUNTER — Ambulatory Visit: Payer: Self-pay | Attending: Specialist | Admitting: Physical Therapy

## 2017-10-09 DIAGNOSIS — M542 Cervicalgia: Secondary | ICD-10-CM | POA: Insufficient documentation

## 2017-10-09 DIAGNOSIS — M6281 Muscle weakness (generalized): Secondary | ICD-10-CM | POA: Insufficient documentation

## 2017-10-09 DIAGNOSIS — R293 Abnormal posture: Secondary | ICD-10-CM | POA: Insufficient documentation

## 2017-10-09 DIAGNOSIS — M25511 Pain in right shoulder: Secondary | ICD-10-CM | POA: Insufficient documentation

## 2017-10-09 DIAGNOSIS — G8929 Other chronic pain: Secondary | ICD-10-CM | POA: Insufficient documentation

## 2017-10-09 NOTE — Therapy (Signed)
[place], [place], [address] Phone: [phone]   Fax:  [phone]  Physical Therapy Treatment  Patient Details  Name: Crystal [name] MRN: [id number] Date of Birth: 09-Feb-1961 Referring Provider: Basil Dess MD   Encounter Date: 10/09/2017  PT End of Session - 10/09/17 1159    Visit Number  7    Number of Visits  12    Date for PT Re-Evaluation  10/21/17    Authorization Type  CAFA 100%    PT Start Time  1158    PT Stop Time  1250 20 min heat per patient request , time out will not match charges    PT Time Calculation (min)  52 min    Activity Tolerance  Patient tolerated treatment well;No increased pain    Behavior During Therapy  WFL for tasks assessed/performed       Past Medical History:  Diagnosis Date  . Diabetes mellitus   . Hypertension   . Hyperthyroidism     Past Surgical History:  Procedure Laterality Date  . ANTERIOR CERVICAL DECOMP/DISCECTOMY FUSION N/A 10/11/2016   Procedure: ANTERIOR CERVICAL DISCECTOMY FUSION C6-7 WITH EXCISION OF OSTEOPHYTE;  Surgeon: [name], MD;  Location: [facility];  Service: Orthopedics;  Laterality: N/A;  . CESAREAN SECTION    . TUBAL LIGATION      There were no vitals filed for this visit.      [facility] PT Assessment - 10/09/17 0001      AROM   Right Shoulder Flexion  150 Degrees    Right Shoulder ABduction  120 Degrees pain    Right Shoulder Internal Rotation  -- FR WNL pain     Right Shoulder External Rotation  -- FR WNL with pain     Cervical Flexion  -- WNL min pain extension                   [facility] Adult PT Treatment/Exercise - 10/09/17 0001      Neck Exercises: Machines for Strengthening   UBE (Upper Arm Bike)  4 min level 1 in reverse       Neck Exercises: Supine   Neck Retraction  10 reps    Capital Flexion  10 reps    Cervical Rotation  10 reps    Other Supine Exercise  supine UE ther ex with 1 lb : triceps press, horiz abd  (fly) and small circles  x10 each direction     Other Supine Exercise  used ball undr neck for comfort       Shoulder Exercises: Standing   Horizontal ABduction  Strengthening;Both;10 reps;Theraband    Theraband Level (Shoulder Horizontal ABduction)  Level 1 (Yellow)    ABduction  Strengthening;Both;10 reps;Theraband    Theraband Level (Shoulder ABduction)  Level 2 (Red)    Extension  Strengthening;Both;10 reps;Theraband    Theraband Level (Shoulder Extension)  Level 1 (Yellow)    Row  Strengthening;Both;10 reps;Theraband    Theraband Level (Shoulder Row)  Level 4 (Blue)      Moist Heat Therapy   Number Minutes Moist Heat  20 Minutes    Moist Heat Location  Shoulder;Cervical      Manual Therapy   Soft tissue mobilization  Rt. upper trap, levator scap , post cervicals              PT Education - 10/09/17 1240    Education provided  Yes    Education Details  cont HEP ,  posture and stabilization     Person(s) Educated  Patient    Methods  Explanation    Comprehension  Verbalized understanding;Tactile cues required;Verbal cues required          PT Long Term Goals - 10/09/17 1200      PT LONG TERM GOAL #1   Title  Pt will improve cervical AROM to WNL and painfree    Baseline  min pain WFL    Status  Partially Met      PT LONG TERM GOAL #2   Title  Pt will improve R shoulder AROM to WNL and painfree    Baseline  painful ABD     Status  Partially Met      PT LONG TERM GOAL #3   Title  Pt will decrease night pain to 4/10 or less    Baseline  still a problem, severe     Status  On-going            Plan - 10/09/17 1241    Clinical Impression Statement  Patient has met goal for C AROM.  She has a difficult time stabilizing proximally for UE exercises.  Pain with abduction Rt. UE.  Upper traps sore but not hyperonic.      PT Next Visit Plan  stabilization, UE strength, posture, likes manual but does not need tobe a priority    PT Home Exercise Plan  cervical  stretches, exer with red band issued for row, shouder ext, ER /abduction behind back    Consulted and Agree with Plan of Care  Patient       Patient will benefit from skilled therapeutic intervention in order to improve the following deficits and impairments:  Decreased range of motion, Impaired flexibility, Pain, Impaired UE functional use  Visit Diagnosis: Neck pain  Abnormal posture  Muscle weakness (generalized)  Chronic right shoulder pain     Problem List Patient Active Problem List   Diagnosis Date Noted  . Spinal stenosis of cervical region 10/11/2016    Class: Chronic  . Cervical spinal stenosis 10/11/2016  . Hypertension 09/09/2016  . Diabetes mellitus ([place]) 09/09/2016  . Multiple thyroid nodules 01/27/2013  . Disorder of thyroid 07/07/2007  . VARICOSE VEIN 07/07/2007  . GERD 07/07/2007    PAA,JENNIFER 10/09/2017, 12:46 PM  [facility] [address] [place], [place], [address] Phone: [phone]   Fax:  [phone]  Name: Crystal [name] MRN: [id number] Date of Birth: 1960-10-23

## 2017-10-14 ENCOUNTER — Encounter: Payer: Self-pay | Admitting: Physical Therapy

## 2017-10-14 ENCOUNTER — Ambulatory Visit: Payer: Self-pay | Admitting: Physical Therapy

## 2017-10-14 DIAGNOSIS — M6281 Muscle weakness (generalized): Secondary | ICD-10-CM

## 2017-10-14 DIAGNOSIS — G8929 Other chronic pain: Secondary | ICD-10-CM

## 2017-10-14 DIAGNOSIS — M542 Cervicalgia: Secondary | ICD-10-CM

## 2017-10-14 DIAGNOSIS — R293 Abnormal posture: Secondary | ICD-10-CM

## 2017-10-14 DIAGNOSIS — M25511 Pain in right shoulder: Secondary | ICD-10-CM

## 2017-10-14 NOTE — Patient Instructions (Signed)
From ex drawer: Cervical stabilization 1 and 2 all issued 5 to 10 X each.  Holding stab 1 X 5 seconds.   Stab 2 not holding,  Pause only daily

## 2017-10-14 NOTE — Therapy (Signed)
[place], [place], [address] Phone: [phone]   Fax:  [phone]  Physical Therapy Treatment  Patient Details  Name: Crystal [name] MRN: [id number] Date of Birth: [birth year] Referring Provider: Basil Dess MD   Encounter Date: 10/14/2017  PT End of Session - 10/14/17 1555    Visit Number  8    Number of Visits  12    Date for PT Re-Evaluation  10/21/17    PT Start Time  1505    PT Stop Time  1606    PT Time Calculation (min)  61 min    Activity Tolerance  Patient tolerated treatment well    Behavior During Therapy  [facility] for tasks assessed/performed       Past Medical History:  Diagnosis Date  . Diabetes mellitus   . Hypertension   . Hyperthyroidism     Past Surgical History:  Procedure Laterality Date  . ANTERIOR CERVICAL DECOMP/DISCECTOMY FUSION N/A 10/11/2016   Procedure: ANTERIOR CERVICAL DISCECTOMY FUSION C6-7 WITH EXCISION OF OSTEOPHYTE;  Surgeon: [name], MD;  Location: [place];  Service: Orthopedics;  Laterality: N/A;  . CESAREAN SECTION    . TUBAL LIGATION      There were no vitals filed for this visit.  Subjective Assessment - 10/14/17 1511    Subjective  Pain is getting better.  She has been doing the exercises.  No arm pain since last week.    Patient is accompained by:  Interpreter    Pain Score  5     Pain Location  Neck    Pain Orientation  Right    Pain Descriptors / Indicators  Tightness;Sharp    Pain Radiating Towards  No    Aggravating Factors   not sure    Pain Relieving Factors  sleeping  with a rolledscarf around neck,   heat         [facility] PT Assessment - 10/14/17 0001      AROM   Right Shoulder Flexion  164 Degrees    Right Shoulder ABduction  113 Degrees mild pain                   [facility] Adult PT Treatment/Exercise - 10/14/17 0001      Neck Exercises: Seated   Other Seated Exercise  shoulder AROM 2-3 reps,  pain end range with abd  horizontal  add.  mild      Neck Exercises: Supine   Other Supine Exercise  Cervical stabilization 2 ecercises 10 x eack  .    shoulder flex/ etesion,   circled,  horizizontal   add/ abd.     Other Supine Exercise    chin tuch 5 x 5 seconds,  tuch with toung to roof of mouth and eyes looking up,  ,  fist with scap squeeze 5 X with chin tuck,    Shoulder rotations with straight elbows.   Isometric big hug with chin tuck 5 X 5 seconds.        Shoulder Exercises: Standing   Row  10 reps 2c sets,  green band less pain with horizontal add after thi      Moist Heat Therapy   Number Minutes Moist Heat  20 Minutes    Moist Heat Location  Shoulder;Cervical             PT Education - 10/14/17 1555    Education provided  Yes    Education Details  HEP  Person(s) Educated  Patient    Methods  Explanation;Demonstration;Tactile cues;Verbal cues;Handout    Comprehension  Returned demonstration;Need further instruction;Verbalized understanding          PT Long Term Goals - 10/14/17 1600      PT LONG TERM GOAL #1   Title  Pt will improve cervical AROM to WNL and painfree    Time  6    Status  Unable to assess      PT LONG TERM GOAL #2   Title  Pt will improve R shoulder AROM to WNL and painfree    Baseline  3/10 abduction WNL except  Abd and horizontal add    Time  6    Period  Weeks    Status  Partially Met      PT LONG TERM GOAL #3   Title  Pt will decrease night pain to 4/10 or less    Baseline  3-4 /10 pain at night uses rolled scarf for support    Time  6    Period  Weeks    Status  Achieved      PT LONG TERM GOAL #4   Title  Pt will centralize R UE pain to no pain in the UE    Baseline  No radiating pain  since last week?  consistant    Time  6    Period  Weeks    Status  Partially Met      PT LONG TERM GOAL #5   Title  Pt will report no difficulty with ADLs due to the UE    Time  6    Period  Weeks    Status  Unable to assess            Plan - 10/14/17 1602     Clinical Impression Statement  LTG #3 met,  LTG #4 partially met.  No pain after exercise today.  She needs cues with her new HEP.  See flow sheet for AROm.    PT Next Visit Plan  stabilization, UE strength, posture, Review cervical stab exercises    PT Home Exercise Plan  cervical stretches, exer with red band issued for row, shouder ext, ER /abduction behind back,  cervical stab 1 and 2 from ex drawer    Consulted and Agree with Plan of Care  Patient       Patient will benefit from skilled therapeutic intervention in order to improve the following deficits and impairments:     Visit Diagnosis: Neck pain  Abnormal posture  Muscle weakness (generalized)  Chronic right shoulder pain     Problem List Patient Active Problem List   Diagnosis Date Noted  . Spinal stenosis of cervical region 10/11/2016    Class: Chronic  . Cervical spinal stenosis 10/11/2016  . Hypertension 09/09/2016  . Diabetes mellitus ([place]) 09/09/2016  . Multiple thyroid nodules 01/27/2013  . Disorder of thyroid 07/07/2007  . VARICOSE VEIN 07/07/2007  . GERD 07/07/2007    Crystal [name]  PTA 10/14/2017, 4:05 PM  [facility] [address] [place], [place], [address] Phone: [phone]   Fax:  [phone]  Name: Crystal [name] MRN: [id number] Date of Birth: 24-Jun-1961

## 2017-10-15 ENCOUNTER — Ambulatory Visit: Payer: No Typology Code available for payment source | Admitting: Family Medicine

## 2017-10-16 ENCOUNTER — Ambulatory Visit: Payer: Self-pay | Admitting: Physical Therapy

## 2017-10-16 ENCOUNTER — Encounter: Payer: Self-pay | Admitting: Physical Therapy

## 2017-10-16 DIAGNOSIS — M6281 Muscle weakness (generalized): Secondary | ICD-10-CM

## 2017-10-16 DIAGNOSIS — G8929 Other chronic pain: Secondary | ICD-10-CM

## 2017-10-16 DIAGNOSIS — R293 Abnormal posture: Secondary | ICD-10-CM

## 2017-10-16 DIAGNOSIS — M25511 Pain in right shoulder: Secondary | ICD-10-CM

## 2017-10-16 DIAGNOSIS — M542 Cervicalgia: Secondary | ICD-10-CM

## 2017-10-16 NOTE — Therapy (Signed)
[place], [place], [address] Phone: [phone]   Fax:  [phone]  Physical Therapy Treatment  Patient Details  Name: Crystal [name] MRN: [id number] Date of Birth: [birth year] Referring Provider: Basil Dess MD   Encounter Date: 10/16/2017  PT End of Session - 10/16/17 1148    Visit Number  9    Number of Visits  12    Date for PT Re-Evaluation  10/21/17    PT Start Time  1104    PT Stop Time  1205    PT Time Calculation (min)  61 min    Activity Tolerance  Patient tolerated treatment well    Behavior During Therapy  [facility] for tasks assessed/performed       Past Medical History:  Diagnosis Date  . Diabetes mellitus   . Hypertension   . Hyperthyroidism     Past Surgical History:  Procedure Laterality Date  . ANTERIOR CERVICAL DECOMP/DISCECTOMY FUSION N/A 10/11/2016   Procedure: ANTERIOR CERVICAL DISCECTOMY FUSION C6-7 WITH EXCISION OF OSTEOPHYTE;  Surgeon: [name], MD;  Location: [place];  Service: Orthopedics;  Laterality: N/A;  . CESAREAN SECTION    . TUBAL LIGATION      There were no vitals filed for this visit.  Subjective Assessment - 10/16/17 1109    Subjective  No pain now,  Yesterday it was a 10/10    She did her new exercises at home.   Pain is worse at night.    Patient is accompained by:  Interpreter    Pain Score  0-No pain up to 10/10    Pain Orientation  Right                      [facility] Adult PT Treatment/Exercise - 10/16/17 0001      Neck Exercises: Seated   Cervical Rotation  Both;5 reps    Lateral Flexion  5 reps    Shoulder Shrugs  10 reps    Shoulder Rolls  10 reps both    Shoulder Flexion  10 reps    Shoulder ABduction  10 reps;Both    Other Seated Exercise  shoulder row, 10 X green band.  cued for neutral spine vs arch    Other Seated Exercise  shoulder protraction/retraction      Neck Exercises: Supine   Other Supine Exercise  Cervical  stabilization 2 ecercises 10 x eack  .    shoulder flex/ etesion,   circled,  horizizontal   add/ abd.     Other Supine Exercise    chin tuch 10 x 5 seconds,  tuch with toung to roof of mouth and eyes looking up,  ,  fist with scap squeeze 10 X with chin tuck,    Shoulder rotations with straight elbows.   Isometric big hug with chin tuck 10 X 5 seconds.        Moist Heat Therapy   Number Minutes Moist Heat  20 Minutes    Moist Heat Location  Shoulder;Cervical             PT Education - 10/16/17 1148    Education provided  Yes    Education Details  cues for current HEP    Person(s) Educated  Patient    Methods  Explanation;Demonstration;Verbal cues    Comprehension  Verbalized understanding;Returned demonstration          PT Long Term Goals - 10/16/17 1151  PT LONG TERM GOAL #1   Title  Pt will improve cervical AROM to WNL and painfree    Baseline  No pain today  ,  ROM WFL however has Lt >RT lateral flexion    Time  6    Period  Weeks    Status  Partially Met      PT LONG TERM GOAL #2   Time  6            Plan - 10/16/17 1322    Clinical Impression Statement  Pain improving.  Cervical ROM  WFL  no pain however Lateral flexion to LT>RT cervical spine.  Mild shoulder soreness right with exercise.  She is adherent with her HEP.    PT Next Visit Plan  stabilization, UE strength, posture, Progress Ex as needed.    PT Home Exercise Plan  cervical stretches, exer with red band issued for row, shouder ext, ER /abduction behind back,  cervical stab 1 and 2 from ex drawer    Consulted and Agree with Plan of Care  Patient       Patient will benefit from skilled therapeutic intervention in order to improve the following deficits and impairments:     Visit Diagnosis: Neck pain  Abnormal posture  Muscle weakness (generalized)  Chronic right shoulder pain     Problem List Patient Active Problem List   Diagnosis Date Noted  . Spinal stenosis of cervical  region 10/11/2016    Class: Chronic  . Cervical spinal stenosis 10/11/2016  . Hypertension 09/09/2016  . Diabetes mellitus ([place]) 09/09/2016  . Multiple thyroid nodules 01/27/2013  . Disorder of thyroid 07/07/2007  . VARICOSE VEIN 07/07/2007  . GERD 07/07/2007    HARRIS,KAREN PTA 10/16/2017, 1:26 PM  [facility] [address] [place], [place], [address] Phone: [phone]   Fax:  [phone]  Name: Crystal [name] MRN: [id number] Date of Birth: [birth year]

## 2017-10-20 ENCOUNTER — Ambulatory Visit: Payer: Self-pay | Admitting: Physical Therapy

## 2017-10-20 DIAGNOSIS — R293 Abnormal posture: Secondary | ICD-10-CM

## 2017-10-20 DIAGNOSIS — M25511 Pain in right shoulder: Secondary | ICD-10-CM

## 2017-10-20 DIAGNOSIS — G8929 Other chronic pain: Secondary | ICD-10-CM

## 2017-10-20 DIAGNOSIS — M6281 Muscle weakness (generalized): Secondary | ICD-10-CM

## 2017-10-20 DIAGNOSIS — M542 Cervicalgia: Secondary | ICD-10-CM

## 2017-10-20 NOTE — Therapy (Signed)
[place], [place], [address] Phone: [phone]   Fax:  [phone]  Physical Therapy Treatment and Discharge   Patient Details  Name: Crystal [name] MRN: [id number] Date of Birth: 10-04-1961 Referring Provider: Basil Dess MD   Encounter Date: 10/20/2017  PT End of Session - 10/20/17 1410    Visit Number  10    Number of Visits  12    Date for PT Re-Evaluation  10/21/17    Authorization Type  CAFA 100%    PT Start Time  1340 pt late     PT Stop Time  1430    PT Time Calculation (min)  50 min    Activity Tolerance  Patient tolerated treatment well    Behavior During Therapy  [facility] for tasks assessed/performed       Past Medical History:  Diagnosis Date  . Diabetes mellitus   . Hypertension   . Hyperthyroidism     Past Surgical History:  Procedure Laterality Date  . ANTERIOR CERVICAL DECOMP/DISCECTOMY FUSION N/A 10/11/2016   Procedure: ANTERIOR CERVICAL DISCECTOMY FUSION C6-7 WITH EXCISION OF OSTEOPHYTE;  Surgeon: [name], MD;  Location: [place];  Service: Orthopedics;  Laterality: N/A;  . CESAREAN SECTION    . TUBAL LIGATION      There were no vitals filed for this visit.  Subjective Assessment - 10/20/17 1341    Subjective  None now.  10/10 yesterday.   Pt says that when she is more active, cooking, cleaning.  She is more comfortable on her Rt. side but then it hurts her later on.     Currently in Pain?  No/denies         [facility] PT Assessment - 10/20/17 0001      AROM   Cervical Flexion  70    Cervical Extension  60    Cervical - Right Side Bend  30    Cervical - Left Side Bend  35    Cervical - Right Rotation  WNL    Cervical - Left Rotation  WNL       Strength   Overall Strength Comments  4/5 bilateral shoulder            [facility] Adult PT Treatment/Exercise - 10/20/17 0001      Neck Exercises: Machines for Strengthening   UBE (Upper Arm Bike)  5 min L1       Neck  Exercises: Supine   Neck Retraction  10 reps    Other Supine Exercise  cervical stab: horizontal pull, flexion and ext , added in LE , circles  x 10 each with cueing necessary, incr time language     Other Supine Exercise  dead bug x 10 max cues       Moist Heat Therapy   Number Minutes Moist Heat  15 Minutes    Moist Heat Location  Shoulder;Cervical      Neck Exercises: Stretches   Upper Trapezius Stretch  2 reps;30 seconds    Levator Stretch  2 reps;30 seconds             PT Education - 10/20/17 1356    Education provided  Yes    Education Details  cervical stab, ex and discharge     Person(s) Educated  Patient    Methods  Explanation;Demonstration    Comprehension  Verbalized understanding;Verbal cues required          PT Long Term Goals - 10/20/17 1409  PT LONG TERM GOAL #1   Title  Pt will improve cervical AROM to WNL and painfree    Baseline  less lateral flexion to R than L     Status  Partially Met      PT LONG TERM GOAL #2   Title  Pt will improve R shoulder AROM to WNL and painfree    Status  Achieved      PT LONG TERM GOAL #3   Title  Pt will decrease night pain to 4/10 or less    Baseline  inconsistent, sometimes 10/10     Status  Partially Met      PT LONG TERM GOAL #4   Title  Pt will centralize R UE pain to no pain in the UE    Status  Achieved      PT LONG TERM GOAL #5   Title  Pt will report no difficulty with ADLs due to the UE    Baseline  difficult to clean and do housework at times     Status  Partially Met            Plan - 10/20/17 1510    Clinical Impression Statement  Pt is better than when she began therapy.  Pain is intermittent but can be severe at times, mostly at night.  Seems to correlate with more UE activity.  She has a full HEP.  At this point, I feel we have done all we can for her.  She was given info on local therapeutic massage as that seems to help her the most.     PT Next Visit Plan  NA    PT Home  Exercise Plan  cervical stretches, exer with red band issued for row, shouder ext, ER /abduction behind back,  cervical stab 1 and 2 from ex drawer    Consulted and Agree with Plan of Care  Patient       Patient will benefit from skilled therapeutic intervention in order to improve the following deficits and impairments:     Visit Diagnosis: Neck pain  Abnormal posture  Muscle weakness (generalized)  Chronic right shoulder pain     Problem List Patient Active Problem List   Diagnosis Date Noted  . Spinal stenosis of cervical region 10/11/2016    Class: Chronic  . Cervical spinal stenosis 10/11/2016  . Hypertension 09/09/2016  . Diabetes mellitus ([place]) 09/09/2016  . Multiple thyroid nodules 01/27/2013  . Disorder of thyroid 07/07/2007  . VARICOSE VEIN 07/07/2007  . GERD 07/07/2007    Nickolus Wadding 10/20/2017, 3:15 PM  [place] [facility] [address] [place], [place], [address] Phone: [phone]   Fax:  [phone]  Name: Crystal [name] MRN: [id number] Date of Birth: Sep 21, 1961  PHYSICAL THERAPY DISCHARGE SUMMARY  Visits from Start of Care: 10  Current functional level related to goals / functional outcomes: See above    Remaining deficits: Min weakness, cervical AROM, intermittent pain severe at times    Education / Equipment: HEP, MHP, massage therapy resources  Plan: Patient agrees to discharge.  Patient goals were partially met. Patient is being discharged due to the patient's request.  ?????    Plateau of progress  Raeford Razor, PT 10/20/17 3:20 PM Phone: [phone] Fax: [phone]

## 2017-10-22 ENCOUNTER — Ambulatory Visit: Payer: Self-pay | Admitting: Physical Therapy

## 2017-10-27 ENCOUNTER — Encounter: Payer: No Typology Code available for payment source | Admitting: Physical Therapy

## 2017-10-29 ENCOUNTER — Encounter: Payer: No Typology Code available for payment source | Admitting: Physical Therapy

## 2017-10-30 ENCOUNTER — Ambulatory Visit (INDEPENDENT_AMBULATORY_CARE_PROVIDER_SITE_OTHER): Payer: Self-pay | Admitting: Specialist

## 2017-10-30 ENCOUNTER — Encounter (INDEPENDENT_AMBULATORY_CARE_PROVIDER_SITE_OTHER): Payer: Self-pay | Admitting: Specialist

## 2017-10-30 VITALS — BP 140/75 | HR 90 | Ht 66.0 in | Wt 171.0 lb

## 2017-10-30 DIAGNOSIS — G8929 Other chronic pain: Secondary | ICD-10-CM

## 2017-10-30 DIAGNOSIS — M25511 Pain in right shoulder: Secondary | ICD-10-CM

## 2017-10-30 DIAGNOSIS — G5621 Lesion of ulnar nerve, right upper limb: Secondary | ICD-10-CM

## 2017-10-30 NOTE — Progress Notes (Signed)
Office Visit Note   Patient: Crystal [name]           Date of Birth: 10/29/1960           MRN: [id number] Visit Date: 10/30/2017              Requested by: Dorena Dew, FNP [address], [place] [address] PCP: Dorena Dew, FNP   Assessment & Plan: Visit Diagnoses:  1. Chronic right shoulder pain     Plan: With patient's ongoing shoulder pain and failed conservative treatment at this point we will schedule MRI to rule out rotator cuff tear and other shoulder pathology. Patient also has a large calcific loose body seen in previous x-rays November 2018.  These were reviewed with Dr. Alphonzo Severance and he thinks that this may be in the subscap tendon.  We'll follow-up after completion of study to discuss results and further treatment options.  Follow-Up Instructions: No Follow-up on file.   Orders:  Orders Placed This Encounter  Procedures  . MR SHOULDER RIGHT WO CONTRAST   No orders of the defined types were placed in this encounter.     Procedures: No procedures performed   Clinical Data: No additional findings.   Subjective: Chief Complaint  Patient presents with  . Neck - Follow-up  . Right Shoulder - Follow-up    HPI Patient returns for recheck of neck pain and right shoulder pain. States her neck is doing much better. She to have ongoing shoulder pain with overactivity and reach behind her back. Pain also when she lays on her right shoulder at night. She has been having some intermittent forearm discomfort also when she is in bed at night with her elbow flexed. States that forearm issue is not constant. Patient has history of diabetes and states that her blood sugar was over 200 this morning. Review of Systems no current cardiac pulmonary GI GU issues Objective: Vital Signs: BP 140/75 (BP Location: Left Arm, Patient Position: Sitting)   Pulse 90   Ht 5\' 6"  (1.676 m)   Wt 171 lb (77.6 kg)   LMP 06/02/2013   BMI 27.60 kg/m   Physical  Exam  Constitutional: She is oriented to person, place, and time.  Musculoskeletal:  Service spine nontender. Right shoulder she has good range of motion but with some discomfort. Positive impingement test. Negative drop arm. Some discomfort with supraspinatus resistance. Right elbow good range motion. She does have a mildly positive Tinel's over the cubital tunnel.  Neurological: She is alert and oriented to person, place, and time.    Ortho Exam  Specialty Comments:  No specialty comments available.  Imaging: No results found.   PMFS History: Patient Active Problem List   Diagnosis Date Noted  . Spinal stenosis of cervical region 10/11/2016    Class: Chronic  . Cervical spinal stenosis 10/11/2016  . Hypertension 09/09/2016  . Diabetes mellitus ([place]) 09/09/2016  . Multiple thyroid nodules 01/27/2013  . Disorder of thyroid 07/07/2007  . VARICOSE VEIN 07/07/2007  . GERD 07/07/2007   Past Medical History:  Diagnosis Date  . Diabetes mellitus   . Hypertension   . Hyperthyroidism     No family history on file.  Past Surgical History:  Procedure Laterality Date  . ANTERIOR CERVICAL DECOMP/DISCECTOMY FUSION N/A 10/11/2016   Procedure: ANTERIOR CERVICAL DISCECTOMY FUSION C6-7 WITH EXCISION OF OSTEOPHYTE;  Surgeon: [name], MD;  Location: [place];  Service: Orthopedics;  Laterality: N/A;  .  CESAREAN SECTION    . TUBAL LIGATION     Social History   Occupational History  . Not on file  Tobacco Use  . Smoking status: Never Smoker  . Smokeless tobacco: Never Used  Substance and Sexual Activity  . Alcohol use: No  . Drug use: No  . Sexual activity: Not on file

## 2017-10-31 ENCOUNTER — Ambulatory Visit: Payer: No Typology Code available for payment source | Admitting: Family Medicine

## 2017-11-22 ENCOUNTER — Other Ambulatory Visit: Payer: No Typology Code available for payment source

## 2017-11-23 ENCOUNTER — Ambulatory Visit
Admission: RE | Admit: 2017-11-23 | Discharge: 2017-11-23 | Disposition: A | Discharge status: Home / Self Care | Payer: No Typology Code available for payment source | Source: Ambulatory Visit | Attending: Surgery | Admitting: Surgery

## 2017-11-23 DIAGNOSIS — M25511 Pain in right shoulder: Principal | ICD-10-CM

## 2017-11-23 DIAGNOSIS — G8929 Other chronic pain: Secondary | ICD-10-CM

## 2017-11-27 ENCOUNTER — Ambulatory Visit (INDEPENDENT_AMBULATORY_CARE_PROVIDER_SITE_OTHER): Payer: Self-pay | Admitting: Specialist

## 2017-11-27 ENCOUNTER — Encounter (INDEPENDENT_AMBULATORY_CARE_PROVIDER_SITE_OTHER): Payer: Self-pay | Admitting: Specialist

## 2017-11-27 VITALS — BP 133/77 | HR 96 | Ht 66.0 in | Wt 171.0 lb

## 2017-11-27 DIAGNOSIS — M19011 Primary osteoarthritis, right shoulder: Secondary | ICD-10-CM

## 2017-11-27 DIAGNOSIS — M7531 Calcific tendinitis of right shoulder: Secondary | ICD-10-CM

## 2017-11-27 DIAGNOSIS — M7541 Impingement syndrome of right shoulder: Secondary | ICD-10-CM

## 2017-11-27 MED ORDER — IBUPROFEN 600 MG PO TABS: 600.0000 mg | ORAL_TABLET | Freq: Four times a day (QID) | ORAL | 0 refills | Status: DC | PRN

## 2017-11-27 MED FILL — IBUPROFEN 600 MG TABLET: 600 | 15 days supply | Qty: 60 | Fill #0

## 2017-11-27 NOTE — Addendum Note (Signed)
Addended by: Basil Dess on: 11/27/2017 11:39 AM   Modules accepted: Orders

## 2017-11-27 NOTE — Patient Instructions (Signed)
Avoid overhead lifting and overhead use of the arms. Do not lift greater than 10 lbs. Tylenol ES one every 6-8 hours for pain and inflamation. An appt with Dr. Marlou Sa to consider right shoulder ultrasound guided tendon aspiration or injection of steriod vs arthroscopy.  PT did not seem to be of benefit so that intervention with direct steriod injection or aspiration of the calcific area of the tendon in the shoulder may be necessary.

## 2017-11-27 NOTE — Progress Notes (Signed)
Office Visit Note   Patient: Crystal [name]           Date of Birth: July 12, 1961           MRN: [id number] Visit Date: 11/27/2017              Requested by: Dorena Dew, FNP [address], [place] [address] PCP: Dorena Dew, FNP   Assessment & Plan: Visit Diagnoses:  1. Calcific tendonitis of right shoulder region   2. Osteoarthritis of right acromioclavicular joint     Plan: Avoid overhead lifting and overhead use of the arms. Do not lift greater than 10 lbs. Tylenol ES one every 6-8 hours for pain and inflamation. An appt with Dr. Marlou Sa to consider right shoulder ultrasound guided tendon aspiration or injection of steriod vs arthroscopy.  PT did not seem to be of benefit so that intervention with direct steriod injection or aspiration of the calcific area of the tendon in the shoulder may be necessary.  Follow-Up Instructions: Return in about 3 weeks (around 12/18/2017) for Dr. Marlou Sa in 2-3 weeks for treatment of calcific  tendonitis right  shoulder.   Orders:  No orders of the defined types were placed in this encounter.  No orders of the defined types were placed in this encounter.     Procedures: No procedures performed   Clinical Data: Findings:  MRI of the right shoulder with diminuative right biceps tendon with calcific changes in the area of insertion site of the right supraspinatus and infraspinatus muscle tendons and right biceps.    Subjective: Chief Complaint  Patient presents with  . Right Shoulder - Follow-up    MRI Review     53 year ol right female with ongoing right shoulder pain, radiographic signs of calcific tendonitis. Not improved with PT. MRI was done.     Review of Systems   Objective: Vital Signs: BP 133/77 (BP Location: Left Arm, Patient Position: Sitting)   Pulse 96   Ht 5\' 6"  (1.676 m)   Wt 171 lb (77.6 kg)   LMP 06/02/2013   BMI 27.60 kg/m   Physical Exam  Constitutional: She is oriented to  person, place, and time. She appears well-developed and well-nourished.  HENT:  Head: Normocephalic and atraumatic.  Eyes: EOM are normal. Pupils are equal, round, and reactive to light.  Neck: Normal range of motion. Neck supple.  Pulmonary/Chest: Effort normal and breath sounds normal.  Abdominal: Soft. Bowel sounds are normal.  Musculoskeletal: Normal range of motion.  Neurological: She is alert and oriented to person, place, and time.  Skin: Skin is warm and dry.  Psychiatric: She has a normal mood and affect. Her behavior is normal. Judgment and thought content normal.    Right Shoulder Exam  Right shoulder exam is normal.  Tenderness  The patient is experiencing tenderness in the acromion, biceps tendon and acromioclavicular joint.  Range of Motion  Active abduction: normal  Passive abduction: normal  Extension: normal  External rotation: normal  Internal rotation 0 degrees: normal  Internal rotation 90 degrees: normal   Muscle Strength  Abduction: 4/5  Internal rotation: 5/5  External rotation: 4/5  Supraspinatus: 5/5  Subscapularis: 5/5  Biceps: 5/5   Tests  Apprehension: positive Hawkins test: negative Cross arm: negative Impingement: positive Drop arm: negative Sulcus: absent  Other  Erythema: absent Sensation: normal Pulse: present      Specialty Comments:  No specialty comments available.  Imaging: No results found.  PMFS History: Patient Active Problem List   Diagnosis Date Noted  . Spinal stenosis of cervical region 10/11/2016    Priority: High    Class: Chronic  . Cervical spinal stenosis 10/11/2016  . Hypertension 09/09/2016  . Diabetes mellitus ([place]) 09/09/2016  . Multiple thyroid nodules 01/27/2013  . Disorder of thyroid 07/07/2007  . VARICOSE VEIN 07/07/2007  . GERD 07/07/2007   Past Medical History:  Diagnosis Date  . Diabetes mellitus   . Hypertension   . Hyperthyroidism     History reviewed. No pertinent family  history.  Past Surgical History:  Procedure Laterality Date  . ANTERIOR CERVICAL DECOMP/DISCECTOMY FUSION N/A 10/11/2016   Procedure: ANTERIOR CERVICAL DISCECTOMY FUSION C6-7 WITH EXCISION OF OSTEOPHYTE;  Surgeon: [name], MD;  Location: [place];  Service: Orthopedics;  Laterality: N/A;  . CESAREAN SECTION    . TUBAL LIGATION     Social History   Occupational History  . Not on file  Tobacco Use  . Smoking status: Never Smoker  . Smokeless tobacco: Never Used  Substance and Sexual Activity  . Alcohol use: No  . Drug use: No  . Sexual activity: Not on file

## 2017-12-08 ENCOUNTER — Ambulatory Visit (INDEPENDENT_AMBULATORY_CARE_PROVIDER_SITE_OTHER): Payer: Self-pay | Admitting: Orthopedic Surgery

## 2017-12-08 ENCOUNTER — Encounter (INDEPENDENT_AMBULATORY_CARE_PROVIDER_SITE_OTHER): Payer: Self-pay | Admitting: Orthopedic Surgery

## 2017-12-08 DIAGNOSIS — M7531 Calcific tendinitis of right shoulder: Secondary | ICD-10-CM

## 2017-12-08 MED ORDER — LIDOCAINE HCL 1 % IJ SOLN
5.0000 mL | INTRAMUSCULAR | Status: AC | PRN
  Administered 2017-12-08: 5 mL

## 2017-12-08 MED ORDER — BUPIVACAINE HCL 0.5 % IJ SOLN
9.0000 mL | INTRAMUSCULAR | Status: AC | PRN
Start: 2017-12-08 — End: 2017-12-08
  Administered 2017-12-08: 9 mL via INTRA_ARTICULAR

## 2017-12-08 MED ORDER — METHYLPREDNISOLONE ACETATE 40 MG/ML IJ SUSP
40.0000 mg | INTRAMUSCULAR | Status: AC | PRN
  Administered 2017-12-08: 40 mg via INTRA_ARTICULAR

## 2017-12-08 NOTE — Progress Notes (Signed)
Office Visit Note   Patient: Crystal [name]           Date of Birth: Sep 02, 1961           MRN: [id number] Visit Date: 12/08/2017 Requested by: Dorena Dew, FNP [address], [place] [address] PCP: Dorena Dew, FNP  Subjective: Chief Complaint  Patient presents with  . Right Shoulder - Follow-up, Pain    HPI: Patient presents for evaluation of right shoulder pain.  She has had an MRI scan of the right shoulder.  That MRI scan is reviewed.  It shows calcific tendinopathy of the subscap tendon which is visible on plain radiographs.  She also has some tendinitis and bursitis.  She has tried physical therapy without relief.  She describes pain in the shoulder region but also pain which radiates around the shoulder blade as well as down the arm and on the medial aspect of her arm and forearm.  She does not report any weakness or mechanical symptoms in the shoulder.  MRI scan shows the rotator cuff to be intact.  There is some biceps tendinitis.              ROS: All systems reviewed are negative as they relate to the chief complaint within the history of present illness.  Patient denies  fevers or chills.   Assessment & Plan: Visit Diagnoses:  1. Calcific tendonitis of right shoulder region     Plan: Impression is biceps tendinitis bursitis and calcific tendinitis in the right shoulder.  She also has symptoms which are radicular in nature extending below the elbow into the forearm as well as behind the shoulder blade.  Nonetheless in regards to her shoulder I think that upon ultrasound examination the calcific tendinitis is mildly tender but does not appear to be amenable to ultrasound-guided puncturing.  I think a subacromial injection would be her best option.  Does not appear to be frozen at this time.  Follow-up with me as needed.  Follow-Up Instructions: Return if symptoms worsen or fail to improve.   Orders:  No orders of the defined types were placed in  this encounter.  No orders of the defined types were placed in this encounter.     Procedures: Large Joint Inj: R subacromial bursa on 12/08/2017 5:57 PM Indications: diagnostic evaluation and pain Details: 18 G 1.5 in needle, posterior approach  Arthrogram: No  Medications: 9 mL bupivacaine 0.5 %; 40 mg methylPREDNISolone acetate 40 MG/ML; 5 mL lidocaine 1 % Outcome: tolerated well, no immediate complications Procedure, treatment alternatives, risks and benefits explained, specific risks discussed. Consent was given by the patient. Immediately prior to procedure a time out was called to verify the correct patient, procedure, equipment, support staff and site/side marked as required. Patient was prepped and draped in the usual sterile fashion.       Clinical Data: No additional findings.  Objective: Vital Signs: LMP 06/02/2013   Physical Exam:   Constitutional: Patient appears well-developed HEENT:  Head: Normocephalic Eyes:EOM are normal Neck: Normal range of motion Cardiovascular: Normal rate Pulmonary/chest: Effort normal Neurologic: Patient is alert Skin: Skin is warm Psychiatric: Patient has normal mood and affect    Ortho Exam: Orthopedic exam demonstrates full passive range of motion of the right shoulder with mild anterior tenderness.  No AC joint tenderness is present.  Rotator cuff strength is excellent to infraspinatus supraspinatus and subscap muscle testing.  No other masses lymph adenopathy or skin changes noted  in the shoulder girdle region.  Negative apprehension relocation testing.  Specialty Comments:  No specialty comments available.  Imaging: No results found.   PMFS History: Patient Active Problem List   Diagnosis Date Noted  . Spinal stenosis of cervical region 10/11/2016    Class: Chronic  . Cervical spinal stenosis 10/11/2016  . Hypertension 09/09/2016  . Diabetes mellitus ([place]) 09/09/2016  . Multiple thyroid nodules 01/27/2013  .  Disorder of thyroid 07/07/2007  . VARICOSE VEIN 07/07/2007  . GERD 07/07/2007   Past Medical History:  Diagnosis Date  . Diabetes mellitus   . Hypertension   . Hyperthyroidism     History reviewed. No pertinent family history.  Past Surgical History:  Procedure Laterality Date  . ANTERIOR CERVICAL DECOMP/DISCECTOMY FUSION N/A 10/11/2016   Procedure: ANTERIOR CERVICAL DISCECTOMY FUSION C6-7 WITH EXCISION OF OSTEOPHYTE;  Surgeon: [name], MD;  Location: [facility];  Service: Orthopedics;  Laterality: N/A;  . CESAREAN SECTION    . TUBAL LIGATION     Social History   Occupational History  . Not on file  Tobacco Use  . Smoking status: Never Smoker  . Smokeless tobacco: Never Used  Substance and Sexual Activity  . Alcohol use: No  . Drug use: No  . Sexual activity: Not on file

## 2017-12-26 ENCOUNTER — Ambulatory Visit: Payer: No Typology Code available for payment source | Attending: Family Medicine

## 2018-01-02 ENCOUNTER — Ambulatory Visit (INDEPENDENT_AMBULATORY_CARE_PROVIDER_SITE_OTHER): Payer: Self-pay | Admitting: Specialist

## 2018-01-02 ENCOUNTER — Encounter (INDEPENDENT_AMBULATORY_CARE_PROVIDER_SITE_OTHER): Payer: Self-pay | Admitting: Specialist

## 2018-01-02 VITALS — BP 144/81 | HR 92 | Ht 66.0 in | Wt 171.0 lb

## 2018-01-02 DIAGNOSIS — M5412 Radiculopathy, cervical region: Secondary | ICD-10-CM

## 2018-01-02 DIAGNOSIS — M7531 Calcific tendinitis of right shoulder: Secondary | ICD-10-CM

## 2018-01-02 DIAGNOSIS — M542 Cervicalgia: Secondary | ICD-10-CM

## 2018-01-02 MED ORDER — BACLOFEN 10 MG PO TABS
10.0000 mg | ORAL_TABLET | Freq: Three times a day (TID) | ORAL | 0 refills | Status: DC
Start: 2018-01-02 — End: 2019-11-08

## 2018-01-02 MED ORDER — BUPIVACAINE HCL 0.25 % IJ SOLN
4.0000 mL | INTRAMUSCULAR | Status: AC | PRN
  Administered 2018-01-02: 4 mL via INTRA_ARTICULAR

## 2018-01-02 MED ORDER — METHYLPREDNISOLONE ACETATE 40 MG/ML IJ SUSP
40.0000 mg | INTRAMUSCULAR | Status: AC | PRN
  Administered 2018-01-02: 40 mg via INTRA_ARTICULAR

## 2018-01-02 NOTE — Progress Notes (Signed)
Office Visit Note   Patient: Crystal [name]           Date of Birth: 1961/05/07           MRN: [id number] Visit Date: 01/02/2018              Requested by: Dorena Dew, FNP [address], [place] [address] PCP: Dorena Dew, FNP   Assessment & Plan: Visit Diagnoses:  1. Calcific tendonitis of right shoulder     Plan:Avoid overhead lifting and overhead use of the arms. Do not lift greater than 10 lbs. Tylenol ES one every 6-8 hours for pain and inflamation.  Continue with PT for another 2-3 weeks, then a home exercise program.Avoid overhead lifting and overhead use of the arms. Do not lift greater than 5 lbs. Adjust head rest in vehicle to prevent hyperextension if rear ended. Take extra precautions to avoid falling. MRI of the cervical spine Muscle relaxer for cervical spasm.  Follow-Up Instructions: Return in about 3 weeks (around 01/23/2018).   Orders:  No orders of the defined types were placed in this encounter.  No orders of the defined types were placed in this encounter.     Procedures: Large Joint Inj: R subacromial bursa on 01/02/2018 11:40 AM Indications: pain Details: 25 G 1.5 in needle, posterior approach  Arthrogram: No  Medications: 40 mg methylPREDNISolone acetate 40 MG/ML; 4 mL bupivacaine 0.25 % Outcome: tolerated well, no immediate complications  Bandaid applied.  Procedure, treatment alternatives, risks and benefits explained, specific risks discussed. Consent was given by the patient. Immediately prior to procedure a time out was called to verify the correct patient, procedure, equipment, support staff and site/side marked as required. Patient was prepped and draped in the usual sterile fashion.       Clinical Data: No additional findings.   Subjective: Chief Complaint  Patient presents with  . Right Shoulder - Follow-up, Pain    Right shoulder tendonitis and OA AC joint. S/p injection with Dr. Marlou Sa 12/08/17     57 year old female with right shoulder tendonitis and bursitis, history of previous anterior cervical discectomy and fusion. She has both concerns of right cervical radiculopathy and right intrinsic calcific tendonitis. Underwent evaluation by Dr. Marlou Sa and he indicates that the calcific tendonitis is probabley not able to be approached via ultrasound guided aspiration and  Repeated perforation technique and recommended SAS injection instead. He gave her a SAS injection which provideded about 20% pain relief and she returns today neary 3 weeks post injection with recurrent pain. Pain also into the medial forarm with is consistent with C6 radiculopathy. She has persistent neck pain and radiation into the medial right forearm.  No recent MRI last was 09/2016 preop for an ACDF at C6-7 for large central HNP.    Review of Systems  Constitutional: Negative.   HENT: Negative.   Eyes: Negative.   Respiratory: Negative.   Cardiovascular: Negative.   Gastrointestinal: Negative.   Endocrine: Negative.   Genitourinary: Negative.   Musculoskeletal: Negative.   Skin: Negative.   Allergic/Immunologic: Negative.   Neurological: Negative.   Hematological: Negative.   Psychiatric/Behavioral: Negative.      Objective: Vital Signs: BP (!) 144/81 (BP Location: Left Arm, Patient Position: Sitting, Cuff Size: Normal)   Pulse 92   Ht 5\' 6"  (1.676 m)   Wt 171 lb (77.6 kg)   LMP 06/02/2013   BMI 27.60 kg/m   Physical Exam  Constitutional: She is  oriented to person, place, and time. She appears well-developed and well-nourished.  HENT:  Head: Normocephalic and atraumatic.  Eyes: Pupils are equal, round, and reactive to light. EOM are normal.  Neck: Normal range of motion. Neck supple.  Pulmonary/Chest: Effort normal and breath sounds normal.  Abdominal: Soft. Bowel sounds are normal.  Neurological: She is alert and oriented to person, place, and time.  Skin: Skin is warm and dry.  Psychiatric:  She has a normal mood and affect. Her behavior is normal. Judgment and thought content normal.    Back Exam   Tenderness  The patient is experiencing tenderness in the cervical.  Range of Motion  Extension: abnormal  Flexion: abnormal  Lateral bend right: abnormal  Lateral bend left: abnormal  Rotation right: abnormal  Rotation left: abnormal   Muscle Strength  Right Quadriceps:  5/5  Left Quadriceps:  5/5  Right Hamstrings:  5/5  Left Hamstrings:  5/5   Tests  Straight leg raise right: negative Straight leg raise left: negative  Reflexes  Achilles: normal Babinski's sign: normal   Other  Toe walk: normal Heel walk: normal Sensation: normal Scars: present  Comments:  Left neck incision scar. Motor with mild right shoulder abduction and IR strength decriment 4/5   Right Shoulder Exam   Tenderness  The patient is experiencing tenderness in the acromion.  Range of Motion  Active abduction: normal  Passive abduction: normal  Extension: normal  External rotation: normal  Forward flexion: normal  Internal rotation 0 degrees: L2  Internal rotation 90 degrees: 60   Muscle Strength  Abduction: 4/5  Internal rotation: 4/5  External rotation: 5/5  Supraspinatus: 5/5  Subscapularis: 5/5  Biceps: 5/5    Left Shoulder Exam  Left shoulder exam is normal.      Specialty Comments:  No specialty comments available.  Imaging: No results found.   PMFS History: Patient Active Problem List   Diagnosis Date Noted  . Spinal stenosis of cervical region 10/11/2016    Priority: High    Class: Chronic  . Cervical spinal stenosis 10/11/2016  . Hypertension 09/09/2016  . Diabetes mellitus ([place]) 09/09/2016  . Multiple thyroid nodules 01/27/2013  . Disorder of thyroid 07/07/2007  . VARICOSE VEIN 07/07/2007  . GERD 07/07/2007   Past Medical History:  Diagnosis Date  . Diabetes mellitus   . Hypertension   . Hyperthyroidism     History reviewed. No  pertinent family history.  Past Surgical History:  Procedure Laterality Date  . ANTERIOR CERVICAL DECOMP/DISCECTOMY FUSION N/A 10/11/2016   Procedure: ANTERIOR CERVICAL DISCECTOMY FUSION C6-7 WITH EXCISION OF OSTEOPHYTE;  Surgeon: [name], MD;  Location: [facility];  Service: Orthopedics;  Laterality: N/A;  . CESAREAN SECTION    . TUBAL LIGATION     Social History   Occupational History  . Not on file  Tobacco Use  . Smoking status: Never Smoker  . Smokeless tobacco: Never Used  Substance and Sexual Activity  . Alcohol use: No  . Drug use: No  . Sexual activity: Not on file

## 2018-01-02 NOTE — Patient Instructions (Signed)
Avoid overhead lifting and overhead use of the arms. Do not lift greater than 10 lbs. Tylenol ES one every 6-8 hours for pain and inflamation.  Continue with PT for another 2-3 weeks, then a home exercise program.Avoid overhead lifting and overhead use of the arms. Do not lift greater than 5 lbs. Adjust head rest in vehicle to prevent hyperextension if rear ended. Take extra precautions to avoid falling. MRI of the cervical spine Muscle relaxer for cervical spasm.

## 2018-01-07 IMAGING — MR MR CERVICAL SPINE W/O CM
4 of 6 series · 20 of 48 positions shown · non-contrast
Comparison: 08/22/2016 cervical radiographs. 12/24/2012 cervical
MRI.

CLINICAL DATA: 55 y/o F; neck pain with right arm pain and numbness
for 3 years.

EXAM:
MRI CERVICAL SPINE WITHOUT CONTRAST
TECHNIQUE: Multiplanar, multisequence MR imaging of the cervical spine was
performed. No intravenous contrast was administered.

[Series 2: 3 plane loc · axial · 3.0mm · 1.09mm/px · z∈[+93,+239]mm · 3 of 25 slices shown]
[im 4/25]
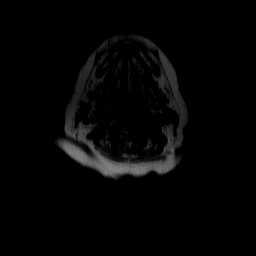
[im 13/25]
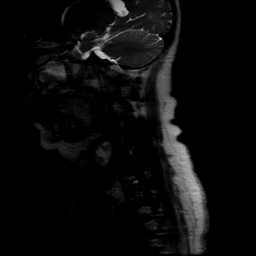
[im 22/25]
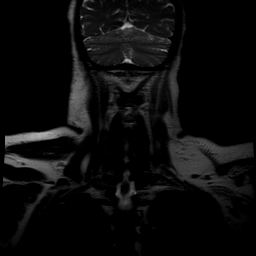

[Series 3: T2 · sagittal · 3.0mm · 0.43mm/px · 7 of 23 slices shown (1 of 2)]
[im 1/23]
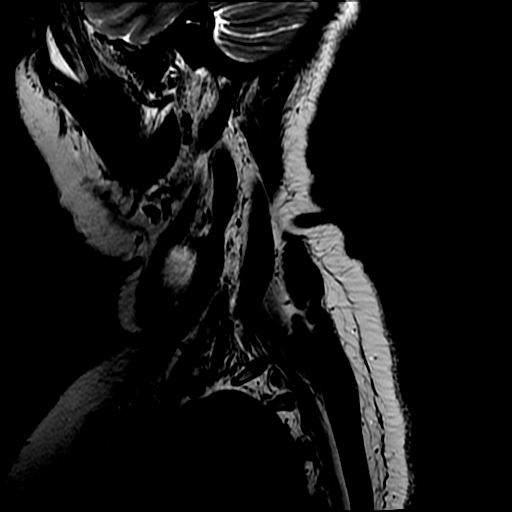
[im 4/23]
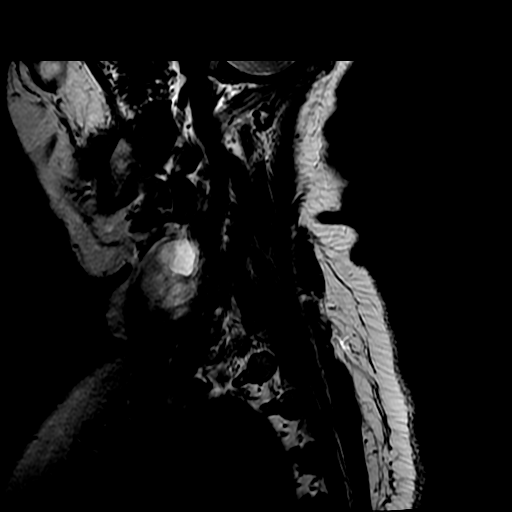
[im 8/23]
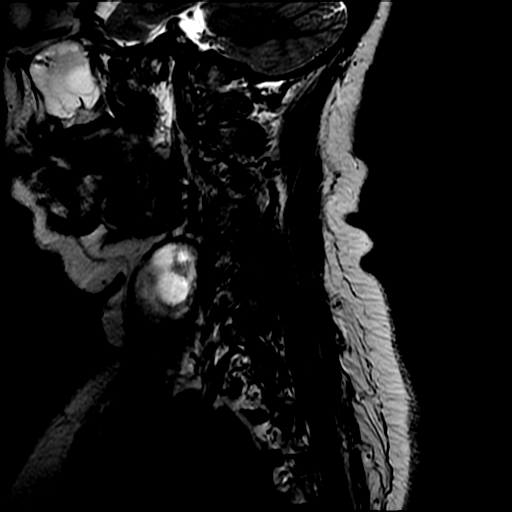
[im 12/23]
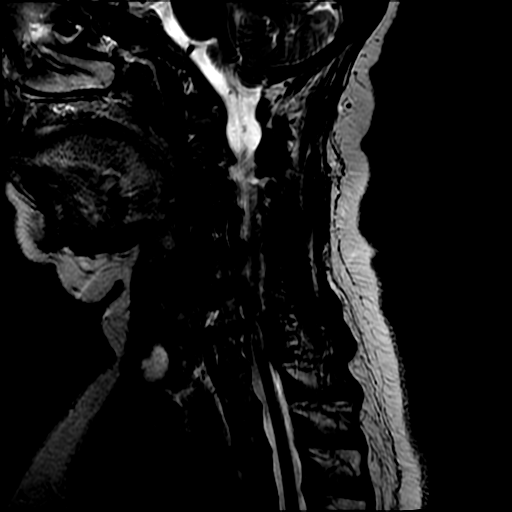
[im 15/23]
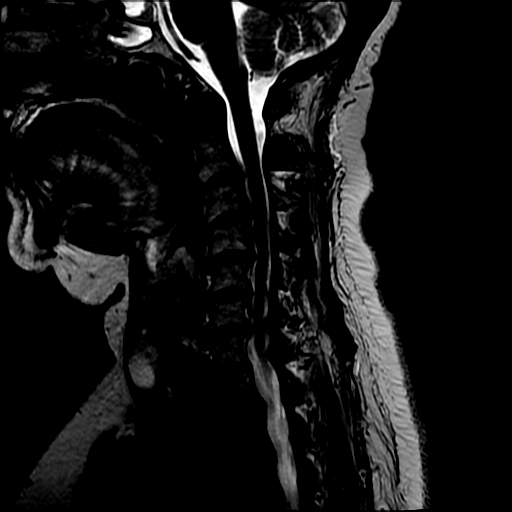
[im 19/23]
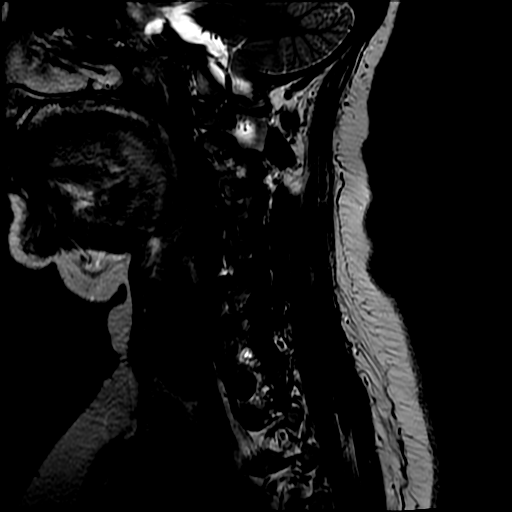
[im 23/23]
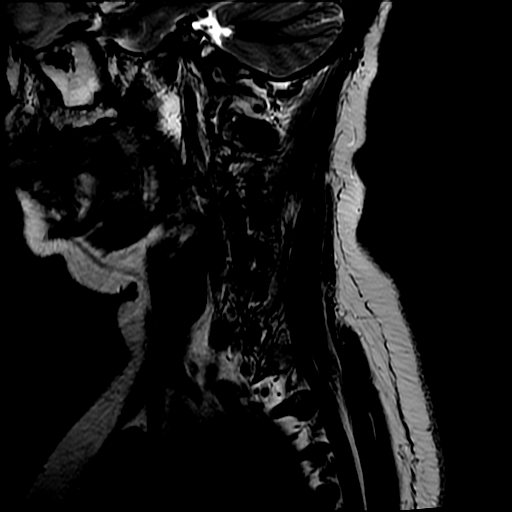

[Series 5: STIR · sagittal · 3.0mm · 0.43mm/px · 3 of 15 slices shown]
[im 1/15]
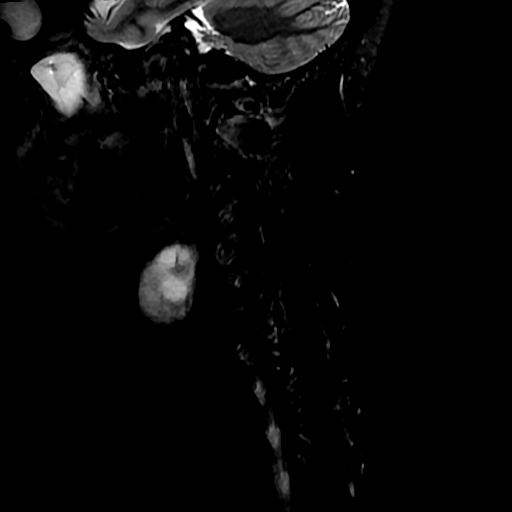
[im 8/15]
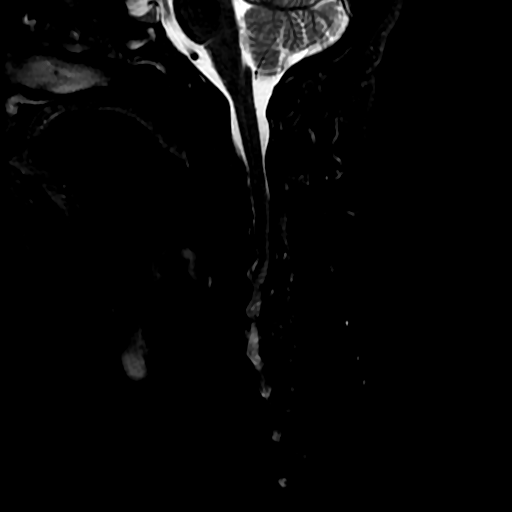
[im 15/15]
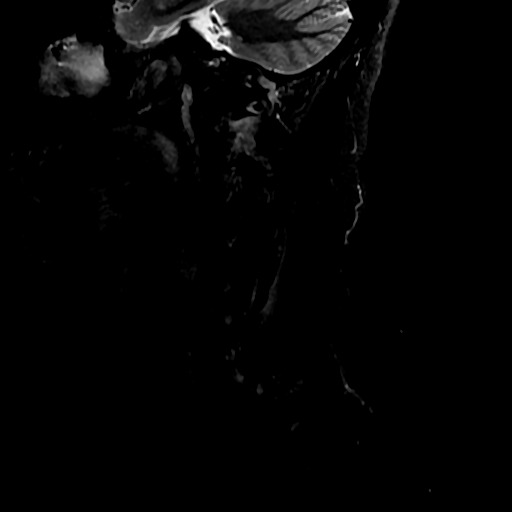

[Series 7: T2 · axial · 3.0mm · 0.35mm/px · z∈[+7,+88]mm · 7 of 29 slices shown (2 of 2)]
[im 1/29]
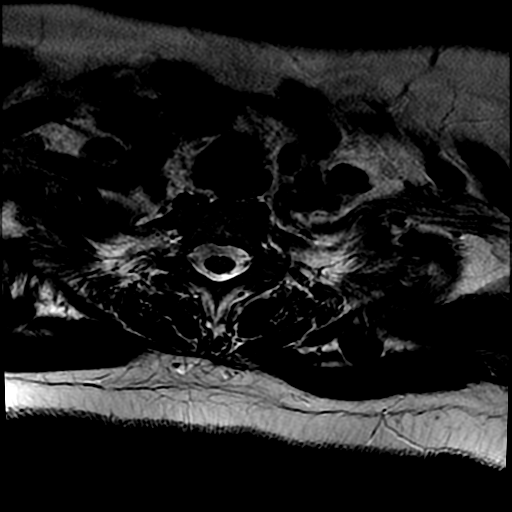
[im 4/29]
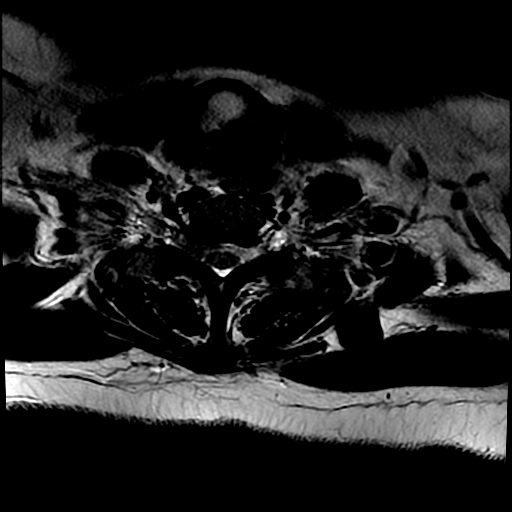
[im 8/29]
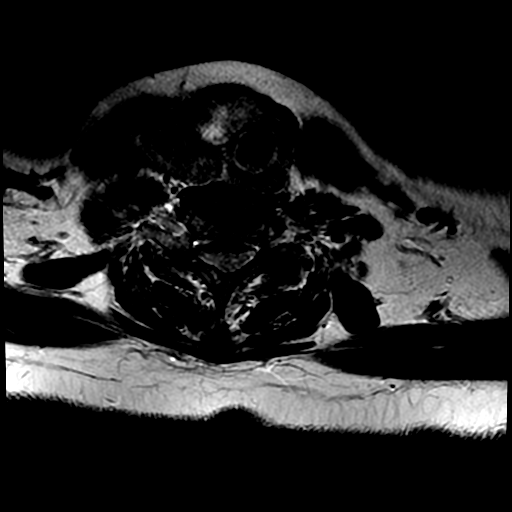
[im 11/29]
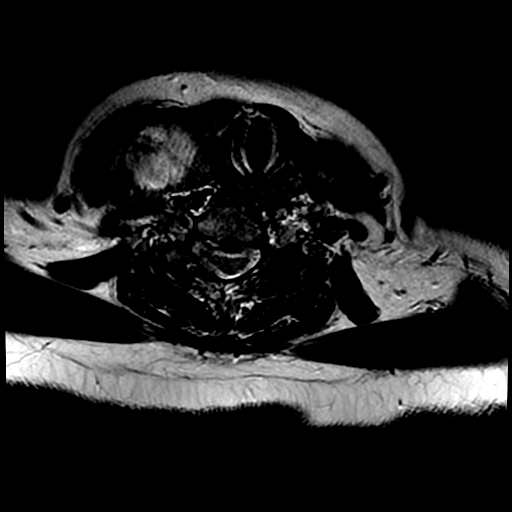
[im 15/29]
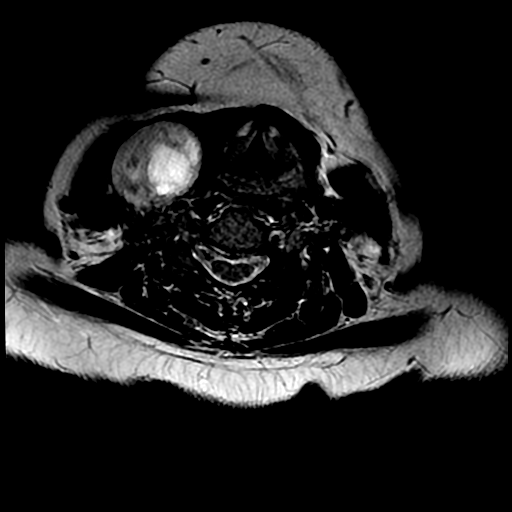
[im 18/29]
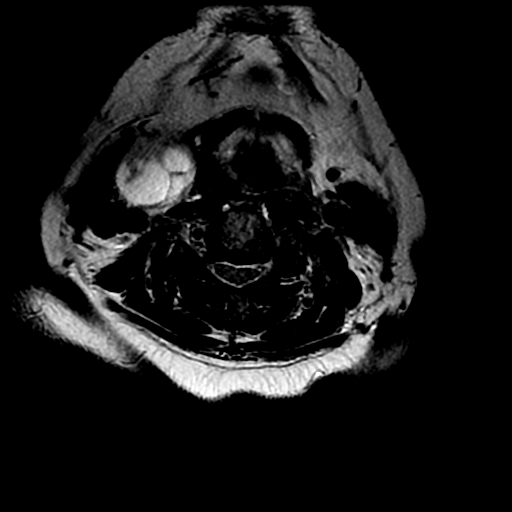
[im 25/29]
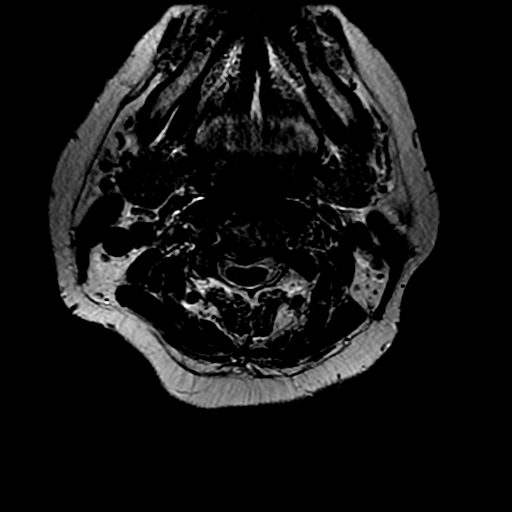

[20 of 48 positions shown; findings below may reference images not displayed]

FINDINGS: Alignment: Mild reversal of cervical lordosis.  No listhesis.

Vertebrae: Mild degenerative edema at the C6-7 articulation. No
evidence for fracture, diskitis, or suspicious bone lesion.

Cord: Increased cord signal within the right lateral cord at the
C6-7 level.

Posterior Fossa, vertebral arteries, paraspinal tissues: Multiple
thyroid nodules the largest in the right upper pole measuring up to
34 mm axially, previously 27 mm.

Disc levels:

C2-3: Increased size of right central disc protrusion with right
anterior cord impingement and mild-to-moderate canal stenosis.
Right-sided uncovertebral hypertrophy with mild right foraminal
narrowing.

C3-4: Disc osteophyte complex with no central protrusion impinging
flattening the anterior cord with moderate canal stenosis.
Uncovertebral hypertrophy with mild bilateral foraminal narrowing.

C4-5: Stable disc osteophyte complex and left-sided uncovertebral
and facet hypertrophy. Moderate to severe left foraminal narrowing.
Anterior cord impingement and flattening with moderate canal
stenosis.

C5-6: Stable disc osteophyte complex and left-greater-than-right
uncovertebral/ facet hypertrophy. Moderate left and mild right
foraminal narrowing. Anterior cord impingement and anterior cord
flattening. Mild-to-moderate canal stenosis.

C6-7: Stable disc osteophyte complex eccentric to the right and
bilateral uncovertebral/ facet hypertrophy. There is severe canal
stenosis with cord flattening. Increased cord signal in the right
aspect of cord were canal stenosis is greatest probably represents
myelomalacia from chronic compression. Severe bilateral foraminal
narrowing.

C7-T1: No significant disc displacement, foraminal narrowing, or
canal stenosis. Left nerve root sheath cyst.
IMPRESSION: 1. Extensive cervical spondylosis with mild progression at the C2-3
and C3-4 levels comparison with the prior MRI.
2. Multiple levels of cord impingement and cord flattening with
mild-to-moderate C2-3, moderate C3-4, moderate C4-5,
mild-to-moderate C5-6, and severe C6-7 canal stenosis.
3. At C6-7 there is increased signal in the right cord where canal
stenosis is greatest probably representing myelomalacia from chronic
compression.
4. Multiple enlarging thyroid nodules, correlate with biopsy
history. Follow-up with thyroid ultrasound as clinically indicated.

By: Jeam Carlos Olaguivel M.D.

## 2018-01-28 ENCOUNTER — Ambulatory Visit
Admission: RE | Admit: 2018-01-28 | Discharge: 2018-01-28 | Disposition: A | Discharge status: Home / Self Care | Payer: No Typology Code available for payment source | Source: Ambulatory Visit | Attending: Specialist | Admitting: Specialist

## 2018-01-28 DIAGNOSIS — M542 Cervicalgia: Secondary | ICD-10-CM

## 2018-01-29 ENCOUNTER — Ambulatory Visit (INDEPENDENT_AMBULATORY_CARE_PROVIDER_SITE_OTHER): Payer: Self-pay | Admitting: Specialist

## 2018-01-29 ENCOUNTER — Encounter (INDEPENDENT_AMBULATORY_CARE_PROVIDER_SITE_OTHER): Payer: Self-pay | Admitting: Specialist

## 2018-01-29 VITALS — BP 165/79 | HR 90 | Ht 66.0 in | Wt 170.0 lb

## 2018-01-29 DIAGNOSIS — Q7649 Other congenital malformations of spine, not associated with scoliosis: Secondary | ICD-10-CM

## 2018-01-29 DIAGNOSIS — M4712 Other spondylosis with myelopathy, cervical region: Secondary | ICD-10-CM

## 2018-01-29 DIAGNOSIS — M5412 Radiculopathy, cervical region: Secondary | ICD-10-CM

## 2018-01-29 MED ORDER — HYDROCODONE-ACETAMINOPHEN 5-325 MG PO TABS: 1.0000 | ORAL_TABLET | Freq: Four times a day (QID) | ORAL | 0 refills | Status: DC | PRN

## 2018-01-29 MED FILL — methIMAzole 5 MG TABS: 5 | 30 days supply | Qty: 60 | Fill #1

## 2018-01-29 MED FILL — metFORMIN HCL 1000 MG TABS: 1000 | 30 days supply | Qty: 30 | Fill #1

## 2018-01-29 MED FILL — GLIMEPIRIDE 4 MG TABLET: 4 | 30 days supply | Qty: 30 | Fill #1

## 2018-01-29 MED FILL — !JANUVIA 25 MG TABLET: 25 | 30 days supply | Qty: 30 | Fill #1

## 2018-01-29 MED FILL — BACLOFEN 10 MG TABLET: 10 | 10 days supply | Qty: 30 | Fill #0

## 2018-01-29 MED FILL — AMLODIPINE BESYLATE 10 MG T: 10 | 30 days supply | Qty: 30 | Fill #1

## 2018-01-29 NOTE — Patient Instructions (Signed)
You plan to be out of the country all of June and return April 07, 2018. Will try to schedule intervention surgery for your neck in next 1-2 weeks so that you  Will be well enough to travel. Avoid overhead lifting and overhead use of the arms. Do not lift greater than 5 lbs. Adjust head rest in vehicle to prevent hyperextension if rear ended. Take extra precautions to avoid falling, including use of a cane if you feel weak. Scheduling secretary Kandice Hams. will call you to arrange for surgery for your cervical spine. If you wish a second opinion please let us know and we can arrange for you. If you have worsening arm or leg numbness or weakness please call or go to an ER. We will contact your cardiologist and primary care physicians to seek clearance for your surgery. Surgery will be an posterior cervical foraminotomy at the right C6-7 level with decompression of the cervical spinal canal at C6-7 on the right side withremoval of bone and ligament pressing on the posterior right spinal cord. Risks of surgery include risks of infection, bleeding and risks to the spinal cord and   Surgery is indicated due to upper extremity radiculopathy, lermittes phenomena and falls. In the future surgery at adjacent levels may be necessary but these levels do not appear to be related to your current symptoms or signs.

## 2018-01-29 NOTE — Progress Notes (Signed)
Office Visit Note   Patient: Crystal [name]           Date of Birth: 08-22-1961           MRN: [id number] Visit Date: 01/29/2018              Requested by: Dorena Dew, FNP [address], [place] [address] PCP: Dorena Dew, FNP   Assessment & Plan: Visit Diagnoses: No diagnosis found.  Plan: You plan to be out of the country all of June and return April 07, 2018. Will try to schedule intervention surgery for your neck in next 1-2 weeks so that you  Will be well enough to travel. Avoid overhead lifting and overhead use of the arms. Do not lift greater than 5 lbs. Adjust head rest in vehicle to prevent hyperextension if rear ended. Take extra precautions to avoid falling, including use of a cane if you feel weak. Scheduling secretary Kandice Hams. will call you to arrange for surgery for your cervical spine. If you wish a second opinion please let us know and we can arrange for you. If you have worsening arm or leg numbness or weakness please call or go to an ER. We will contact your cardiologist and primary care physicians to seek clearance for your surgery. Surgery will be an posterior cervical foraminotomy at the right C6-7 level with decompression of the cervical spinal canal at C6-7 on the right side withremoval of bone and ligament pressing on the posterior right spinal cord. Risks of surgery include risks of infection, bleeding and risks to the spinal cord and   Surgery is indicated due to upper extremity radiculopathy, lermittes phenomena and falls. In the future surgery at adjacent levels may be necessary but these levels do not appear to be related to your current symptoms or signs.   Follow-Up Instructions: No follow-ups on file.   Orders:  No orders of the defined types were placed in this encounter.  No orders of the defined types were placed in this encounter.     Procedures: No procedures performed   Clinical Data: Findings:    MRI of cervical spine from 4/24 show multiple levels of cervical canal narrowing C2-3, C3-4, C4-5 and C5-6. ACDF at C6-7 with the anterior canal well decompressed. There is hypertrophic changes of the right posterior elements, facet and ligamentum flavum that narrow the right lateral spinal canal and impress the right cervical cord. There is cord deformity and an area of signal change just distal to the right sided cord compression. This suggests myelomalacia changes right C6-7 due to persistent posterior cord compression. Anterior canal is well decompressed. ACDF appears solid.     Subjective: Chief Complaint  Patient presents with  . Neck - Follow-up    57 year old female with history of C6-7 ACDF in 10/11/2016.Crystal KitchenShe is healing the ACDF site well but is experiencing persisting pain in the right arm in the ulnar side of the forearm into the right  Ulnar 2 digits consistent with a C8 or T1 radiculopathy. Her previous surgery was C6-7 for severe cervical stenosis with myelopathic changes. She experiences night pain and poor writing with the right hand it shakes. Grip is okay. No  Gait disturbance and no falls. She does note increased clumbsiness with the right hand. Underwent a MRI of the cervical spine 4/24 and returns today to discuss the findings. She relates that with the accident in 05/2018 in her country of [place] she felt  the discomfort in the neck worse.     Review of Systems  Constitutional: Negative.   HENT: Negative.   Eyes: Negative.   Respiratory: Negative.   Cardiovascular: Negative.   Gastrointestinal: Negative.   Endocrine: Negative.   Genitourinary: Negative.   Musculoskeletal: Negative.   Skin: Negative.   Allergic/Immunologic: Negative.   Neurological: Negative.   Hematological: Negative.   Psychiatric/Behavioral: Negative.      Objective: Vital Signs: BP (!) 165/79   Pulse 90   LMP 06/02/2013   Physical Exam  Constitutional: She is oriented to person, place, and  time. She appears well-developed and well-nourished.  HENT:  Head: Normocephalic and atraumatic.  Eyes: Pupils are equal, round, and reactive to light. EOM are normal.  Neck: Normal range of motion. Neck supple.  Pulmonary/Chest: Effort normal and breath sounds normal.  Abdominal: Soft. Bowel sounds are normal.  Neurological: She is alert and oriented to person, place, and time.  Skin: Skin is warm and dry.  Psychiatric: She has a normal mood and affect. Her behavior is normal. Judgment and thought content normal.    Back Exam   Tenderness  The patient is experiencing tenderness in the cervical.  Range of Motion  Extension: abnormal  Flexion: normal  Lateral bend right: abnormal  Lateral bend left: normal  Rotation right: abnormal  Rotation left: normal   Muscle Strength  Right Quadriceps:  5/5  Left Quadriceps:  5/5  Right Hamstrings:  5/5  Left Hamstrings:  5/5   Tests  Straight leg raise right: negative Straight leg raise left: negative  Reflexes  Patellar: normal Achilles: normal Biceps: normal Babinski's sign: normal   Other  Toe walk: normal Heel walk: normal Sensation: decreased Gait: normal  Erythema: no back redness Scars: present  Comments:  Weak right triceps 4/5, right Finger extension  4/5 and right finger abduction 4/5, Hoffman sign is negative.      Specialty Comments:  No specialty comments available.  Imaging: Mr Cervical Spine W/o Contrast  Result Date: 01/28/2018 CLINICAL DATA:  Neck pain. RIGHT arm pain. Radiation to fourth and fifth digit. EXAM: MRI CERVICAL SPINE WITHOUT CONTRAST TECHNIQUE: Multiplanar, multisequence MR imaging of the cervical spine was performed. No intravenous contrast was administered. COMPARISON:  MR cervical 09/06/2016. FINDINGS: Alignment: Reversal of the normal cervical lordotic curve. No subluxation. Vertebrae: No worrisome osseous lesion. Susceptibility obscures C6-C7, site of ACDF. Cord: Multilevel cord  compression. Tiny focus of abnormal cord signal at C7 on the RIGHT. Posterior Fossa, vertebral arteries, paraspinal tissues: Suspected goiter with large thyroid nodules, incompletely evaluated. On the RIGHT, approximate size of 3 x 3.5 x 4 cm. Consider CT neck with contrast for further evaluation. Disc levels: C2-3: Central extrusions/osseous spurring. RIGHT-sided cord flattening. Canal diameter 5 mm. Possible RIGHT C3 foraminal narrowing. C3-4: Central extrusion, RIGHT-sided predominant. Cord flattening. Canal diameter 5 mm. LEFT greater than RIGHT C4 foraminal narrowing. C4-5: Central protrusion. Osseous spurring. Cord flattening. Canal diameter 5 mm. LEFT greater than RIGHT C5 foraminal narrowing. C5-6: Annular bulge. Osseous spurring. Cord flattening. Canal diameter 7 mm. LEFT greater than RIGHT C6 foraminal narrowing. C6-7: Postsurgical change. Probable solid arthrodesis. No ventral mass effect. Ligamentum flavum infolding results in cord flattening, with canal diameter 6 mm on the RIGHT. Tiny focus of abnormal cord signal on the RIGHT, see sagittal series 6, image 11, axial series 8, image 23. C7-T1: Trace anterolisthesis. Facet arthropathy. No definite impingement. IMPRESSION: Multilevel spondylosis as described, with improved neural impingement at C6-7, but continued/worsening multilevel stenosis  and foraminal narrowing at multiple levels from C2-3 through C5-6. Cord compression without abnormal cord signal at the non operative levels. If further investigation desired, consider cervical myelogram and postmyelogram CT. Tiny focus of myelomalacia RIGHT C7. Suspected goiter. Largest T2 hyperintense nodule on the RIGHT measures up to 3 x 3.5 x 4 cm. CT neck with contrast recommended for further evaluation. Electronically Signed   By: Staci Righter M.D.   On: 01/28/2018 15:48     PMFS History: Patient Active Problem List   Diagnosis Date Noted  . Spinal stenosis of cervical region 10/11/2016    Priority:  High    Class: Chronic  . Cervical spinal stenosis 10/11/2016  . Hypertension 09/09/2016  . Diabetes mellitus ([place]) 09/09/2016  . Multiple thyroid nodules 01/27/2013  . Disorder of thyroid 07/07/2007  . VARICOSE VEIN 07/07/2007  . GERD 07/07/2007   Past Medical History:  Diagnosis Date  . Diabetes mellitus   . Hypertension   . Hyperthyroidism     No family history on file.  Past Surgical History:  Procedure Laterality Date  . ANTERIOR CERVICAL DECOMP/DISCECTOMY FUSION N/A 10/11/2016   Procedure: ANTERIOR CERVICAL DISCECTOMY FUSION C6-7 WITH EXCISION OF OSTEOPHYTE;  Surgeon: [name], MD;  Location: [facility];  Service: Orthopedics;  Laterality: N/A;  . CESAREAN SECTION    . TUBAL LIGATION     Social History   Occupational History  . Not on file  Tobacco Use  . Smoking status: Never Smoker  . Smokeless tobacco: Never Used  Substance and Sexual Activity  . Alcohol use: No  . Drug use: No  . Sexual activity: Not on file

## 2018-02-02 ENCOUNTER — Encounter (INDEPENDENT_AMBULATORY_CARE_PROVIDER_SITE_OTHER): Payer: Self-pay | Admitting: Specialist

## 2018-02-11 IMAGING — RF DG CERVICAL SPINE 2 OR 3 VIEWS
1 series · 4 of 4 positions shown · non-contrast
Comparison: MRI cervical spine 09/06/2016.

CLINICAL DATA: C6-7 ACDF.  Intraoperative imaging.

EXAM:
DG C-ARM 61-120 MIN; CERVICAL SPINE - 2-3 VIEW

[Series 1: run · 4 of 4 slices shown]
[im 1/4]
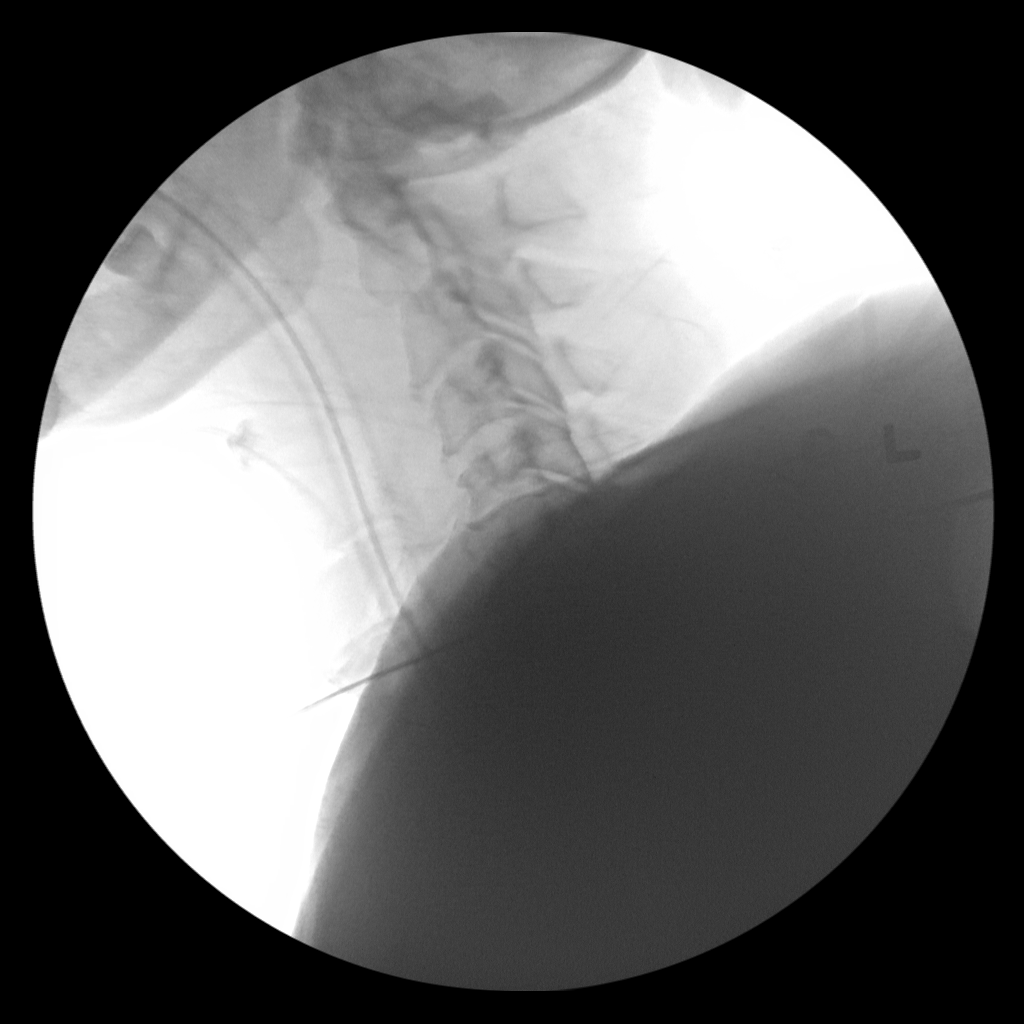
[im 2/4]
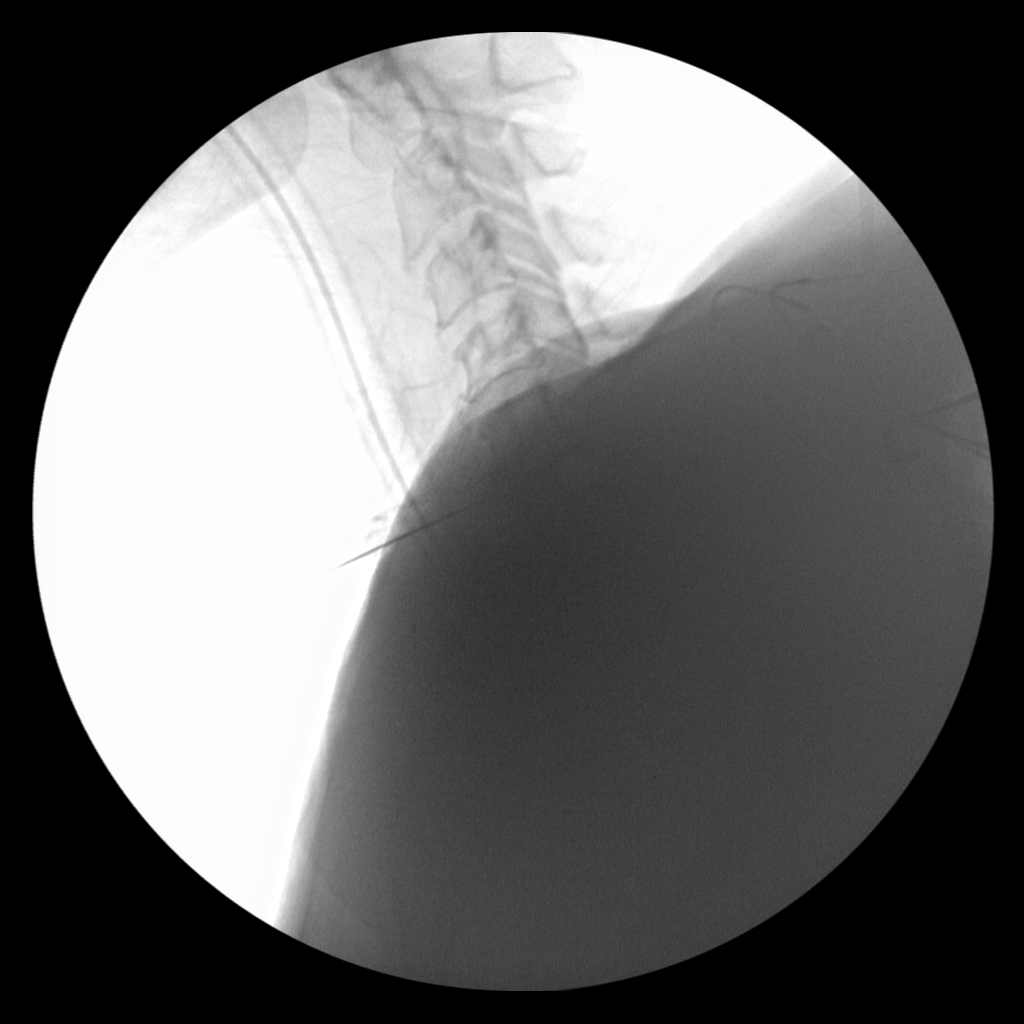
[im 3/4]
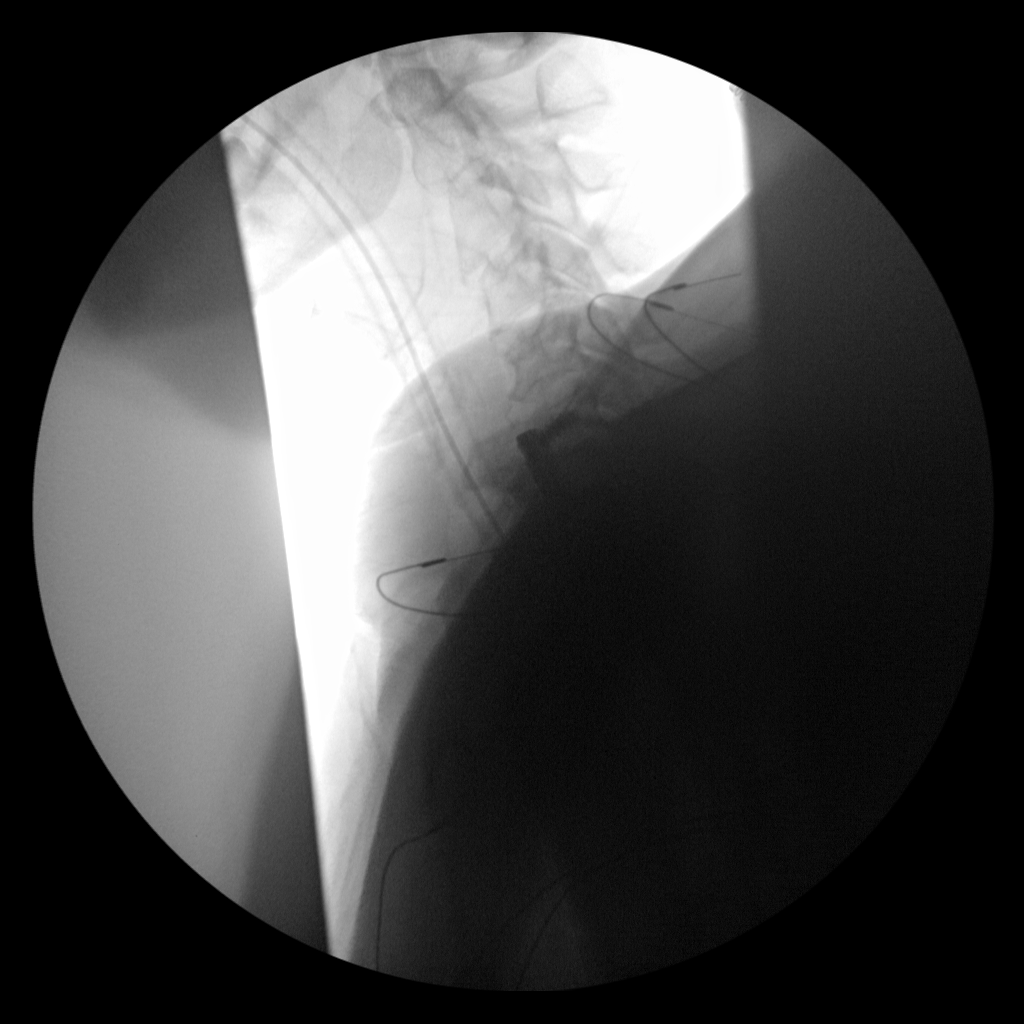
[im 4/4]
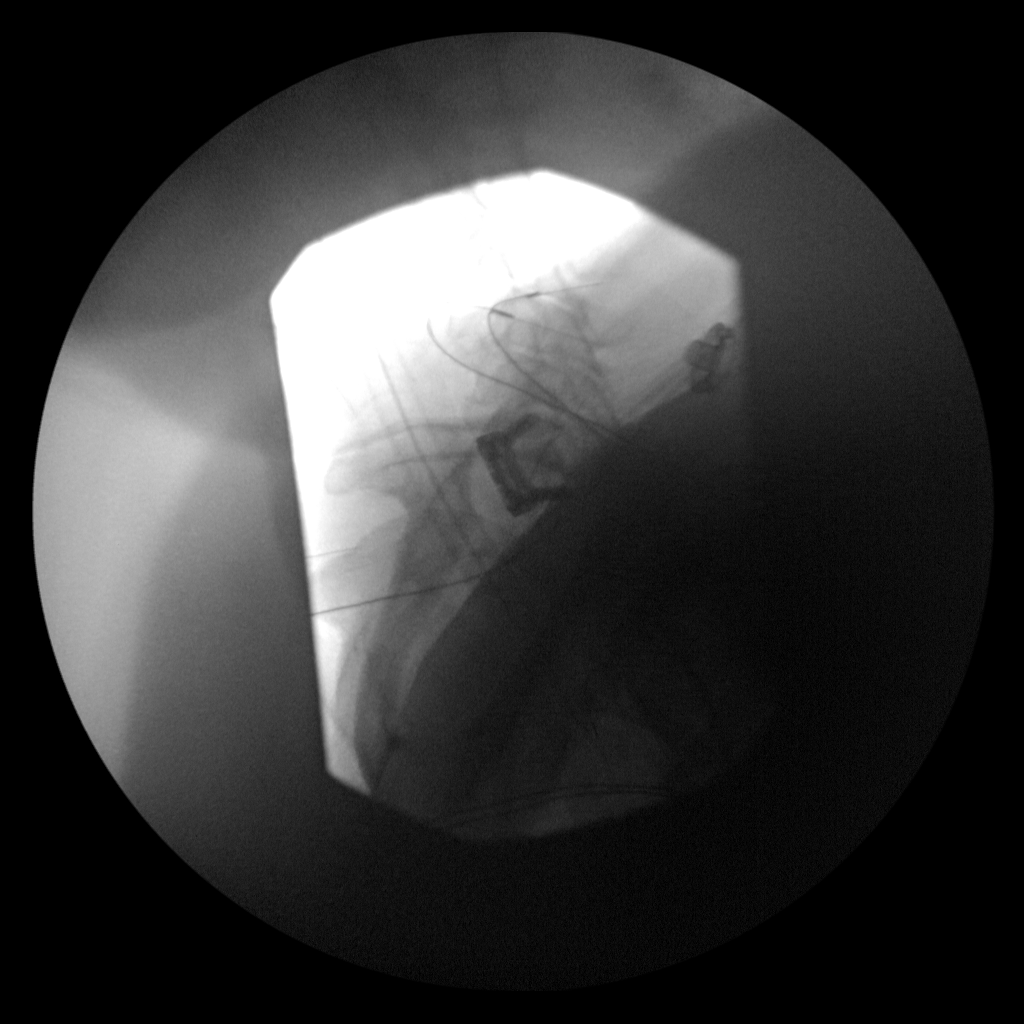

[4 of 4 positions shown; findings below may reference images not displayed]

FINDINGS: We are provided with 4 fluoroscopic intraoperative spot views of the
cervical spine in the lateral projection. Images demonstrate
localization of C6-7 and subsequent placement of anterior plate and
screws and interbody spacer. No acute abnormality is seen.
IMPRESSION: C6-7 ACDF.

## 2018-02-17 ENCOUNTER — Telehealth (INDEPENDENT_AMBULATORY_CARE_PROVIDER_SITE_OTHER): Payer: Self-pay | Admitting: Specialist

## 2018-02-17 NOTE — Telephone Encounter (Signed)
Patient came into the clinic to get her surgery scheduled with Dr. Louanne Skye.  She stated that she had originally wanted the surgery scheduled in July but is now experiencing worse shoulder pain.  Please advise patient.  CB#[phone].  Thank you.

## 2018-02-18 NOTE — Telephone Encounter (Signed)
I spoke with patient and scheduled surgery

## 2018-03-04 ENCOUNTER — Encounter (INDEPENDENT_AMBULATORY_CARE_PROVIDER_SITE_OTHER): Payer: Self-pay | Admitting: Surgery

## 2018-03-04 ENCOUNTER — Ambulatory Visit (INDEPENDENT_AMBULATORY_CARE_PROVIDER_SITE_OTHER): Payer: Self-pay | Admitting: Surgery

## 2018-03-04 DIAGNOSIS — M5412 Radiculopathy, cervical region: Secondary | ICD-10-CM

## 2018-03-04 DIAGNOSIS — M7531 Calcific tendinitis of right shoulder: Secondary | ICD-10-CM

## 2018-03-04 MED FILL — OMEPRAZOLE 20 MG CAP: 20 | 30 days supply | Qty: 30 | Fill #1

## 2018-03-04 MED FILL — AMLODIPINE BESYLATE 10 MG T: 10 | 30 days supply | Qty: 30 | Fill #2

## 2018-03-04 MED FILL — metFORMIN HCL 1000 MG TABS: 1000 | 30 days supply | Qty: 30 | Fill #2

## 2018-03-04 NOTE — Progress Notes (Signed)
57 year old female initially came in today for preop evaluation for right C6-7 foraminotomy.  She states that over the last 7 to 10 days she has not had any right arm radicular symptoms.  2 weeks ago she did have a flareup of right shoulder pain more in the anterior aspect.  That pain was aggravated with overactivity and also laying on her shoulder.  Patient has had a couple of subacromial injections that did give good temporary relief of her shoulder pain.  I did send patient for an MRI of the right shoulder that was done in February 2019 and that did show prominent calcific tendinopathy of the anterior aspect of the distal subscapularis tendon with a 12 mm focal calcification at the level of the insertion on the lesser tuberosity.  Intrinsic degeneration of the distal supraspinatus and interspinous tendons without tear.  Intrinsic degeneration with a longitudinal intrasubstance tear of the biceps tendon at the level of the bicipital groove.  Tendon is diminutive.  Type III acromion.  Patient was seen by Dr. Marlou Sa for this and he did discuss possible shoulder surgery but was waiting to see what was going to be done with her cervical spine issues.  Patient states that since she is not having any cervical radicular symptoms that she is wanting to cancel surgery at this time.  I did speak with Dr. Louanne Skye regarding this.  Also reviewed MRI right shoulder again with Dr. Marlou Sa.  Patient will follow with me in 6 weeks for recheck and I will see which area is causing her problem if any at that time.  If shoulder is the primary problem will refer back to Dr. Marlou Sa and he will discuss outpatient arthroscopy with debridement.  Patient agrees with this plan.  All questions answered.

## 2018-03-05 ENCOUNTER — Ambulatory Visit (INDEPENDENT_AMBULATORY_CARE_PROVIDER_SITE_OTHER): Payer: Self-pay | Admitting: Specialist

## 2018-03-06 ENCOUNTER — Encounter (HOSPITAL_COMMUNITY): Admission: RE | Payer: Self-pay | Source: Ambulatory Visit

## 2018-03-06 ENCOUNTER — Ambulatory Visit (HOSPITAL_COMMUNITY)
Admission: RE | Admit: 2018-03-06 | Payer: No Typology Code available for payment source | Source: Ambulatory Visit | Admitting: Specialist

## 2018-03-06 SURGERY — POSTERIOR CERVICAL FUSION/FORAMINOTOMY LEVEL 1
Anesthesia: General

## 2018-04-15 ENCOUNTER — Ambulatory Visit (INDEPENDENT_AMBULATORY_CARE_PROVIDER_SITE_OTHER): Payer: Self-pay | Admitting: Surgery

## 2018-05-15 ENCOUNTER — Ambulatory Visit: Payer: Self-pay | Attending: Family Medicine

## 2018-05-15 MED FILL — metFORMIN HCL 1000 MG TABS: 1000 | 30 days supply | Qty: 30 | Fill #3

## 2018-05-15 MED FILL — OMEPRAZOLE 20 MG CAP: 20 | 30 days supply | Qty: 30 | Fill #2

## 2018-05-15 MED FILL — methIMAzole 5 MG TABS: 5 | 30 days supply | Qty: 60 | Fill #1

## 2018-05-15 MED FILL — AMLODIPINE BESYLATE 10 MG T: 10 | 30 days supply | Qty: 30 | Fill #3

## 2018-05-22 MED FILL — ?GLIMEPIRIDE 4 MG TABLET: 4 | 30 days supply | Qty: 30 | Fill #2

## 2018-05-29 ENCOUNTER — Encounter: Payer: Self-pay | Admitting: Family Medicine

## 2018-05-29 ENCOUNTER — Ambulatory Visit (INDEPENDENT_AMBULATORY_CARE_PROVIDER_SITE_OTHER): Payer: Self-pay | Admitting: Family Medicine

## 2018-05-29 VITALS — BP 140/66 | HR 86 | Temp 98.9°F | Resp 14 | Ht 66.0 in | Wt 162.0 lb

## 2018-05-29 DIAGNOSIS — I10 Essential (primary) hypertension: Secondary | ICD-10-CM

## 2018-05-29 DIAGNOSIS — E119 Type 2 diabetes mellitus without complications: Secondary | ICD-10-CM

## 2018-05-29 DIAGNOSIS — E0590 Thyrotoxicosis, unspecified without thyrotoxic crisis or storm: Secondary | ICD-10-CM

## 2018-05-29 LAB — POCT GLYCOSYLATED HEMOGLOBIN (HGB A1C): Hemoglobin A1C: 9.8 % — AB (ref 4.0–5.6)

## 2018-05-29 LAB — POCT URINALYSIS DIPSTICK
Bilirubin, UA: NEGATIVE
Blood, UA: NEGATIVE
Glucose, UA: POSITIVE — AB
Ketones, UA: NEGATIVE
Leukocytes, UA: NEGATIVE
Nitrite, UA: NEGATIVE
Protein, UA: NEGATIVE
Spec Grav, UA: 1.025 (ref 1.010–1.025)
Urobilinogen, UA: 0.2 E.U./dL
pH, UA: 5.5 (ref 5.0–8.0)

## 2018-05-29 MED ORDER — AMLODIPINE BESYLATE 10 MG PO TABS: 10.0000 mg | ORAL_TABLET | Freq: Every day | ORAL | 5 refills | Status: DC

## 2018-05-29 MED ORDER — GLIMEPIRIDE 4 MG PO TABS: 4.0000 mg | ORAL_TABLET | Freq: Every day | ORAL | 3 refills | Status: DC

## 2018-05-29 MED ORDER — SITAGLIPTIN PHOSPHATE 25 MG PO TABS: 25.0000 mg | ORAL_TABLET | Freq: Every day | ORAL | 5 refills | Status: DC

## 2018-05-29 MED ORDER — TRUEPLUS LANCETS 28G MISC: 5 refills | Status: DC

## 2018-05-29 MED ORDER — METFORMIN HCL 1000 MG PO TABS: 500.0000 mg | ORAL_TABLET | Freq: Two times a day (BID) | ORAL | 1 refills | Status: DC

## 2018-05-29 MED ORDER — METHIMAZOLE 5 MG PO TABS: 5.0000 mg | ORAL_TABLET | Freq: Two times a day (BID) | ORAL | 1 refills | Status: DC

## 2018-05-29 NOTE — Patient Instructions (Signed)
Diabetes Mellitus and Nutrition When you have diabetes (diabetes mellitus), it is very important to have healthy eating habits because your blood sugar (glucose) levels are greatly affected by what you eat and drink. Eating healthy foods in the appropriate amounts, at about the same times every day, can help you:  Control your blood glucose.  Lower your risk of heart disease.  Improve your blood pressure.  Reach or maintain a healthy weight.  Every person with diabetes is different, and each person has different needs for a meal plan. Your health care provider may recommend that you work with a diet and nutrition specialist (dietitian) to make a meal plan that is best for you. Your meal plan may vary depending on factors such as:  The calories you need.  The medicines you take.  Your weight.  Your blood glucose, blood pressure, and cholesterol levels.  Your activity level.  Other health conditions you have, such as heart or kidney disease.  How do carbohydrates affect me? Carbohydrates affect your blood glucose level more than any other type of food. Eating carbohydrates naturally increases the amount of glucose in your blood. Carbohydrate counting is a method for keeping track of how many carbohydrates you eat. Counting carbohydrates is important to keep your blood glucose at a healthy level, especially if you use insulin or take certain oral diabetes medicines. It is important to know how many carbohydrates you can safely have in each meal. This is different for every person. Your dietitian can help you calculate how many carbohydrates you should have at each meal and for snack. Foods that contain carbohydrates include:  Bread, cereal, rice, pasta, and crackers.  Potatoes and corn.  Peas, beans, and lentils.  Milk and yogurt.  Fruit and juice.  Desserts, such as cakes, cookies, ice cream, and candy.  How does alcohol affect me? Alcohol can cause a sudden decrease in blood  glucose (hypoglycemia), especially if you use insulin or take certain oral diabetes medicines. Hypoglycemia can be a life-threatening condition. Symptoms of hypoglycemia (sleepiness, dizziness, and confusion) are similar to symptoms of having too much alcohol. If your health care provider says that alcohol is safe for you, follow these guidelines:  Limit alcohol intake to no more than 1 drink per day for nonpregnant women and 2 drinks per day for men. One drink equals 12 oz of beer, 5 oz of wine, or 1 oz of hard liquor.  Do not drink on an empty stomach.  Keep yourself hydrated with water, diet soda, or unsweetened iced tea.  Keep in mind that regular soda, juice, and other mixers may contain a lot of sugar and must be counted as carbohydrates.  What are tips for following this plan? Reading food labels  Start by checking the serving size on the label. The amount of calories, carbohydrates, fats, and other nutrients listed on the label are based on one serving of the food. Many foods contain more than one serving per package.  Check the total grams (g) of carbohydrates in one serving. You can calculate the number of servings of carbohydrates in one serving by dividing the total carbohydrates by 15. For example, if a food has 30 g of total carbohydrates, it would be equal to 2 servings of carbohydrates.  Check the number of grams (g) of saturated and trans fats in one serving. Choose foods that have low or no amount of these fats.  Check the number of milligrams (mg) of sodium in one serving. Most people   should limit total sodium intake to less than 2,300 mg per day.  Always check the nutrition information of foods labeled as "low-fat" or "nonfat". These foods may be higher in added sugar or refined carbohydrates and should be avoided.  Talk to your dietitian to identify your daily goals for nutrients listed on the label. Shopping  Avoid buying canned, premade, or processed foods. These  foods tend to be high in fat, sodium, and added sugar.  Shop around the outside edge of the grocery store. This includes fresh fruits and vegetables, bulk grains, fresh meats, and fresh dairy. Cooking  Use low-heat cooking methods, such as baking, instead of high-heat cooking methods like deep frying.  Cook using healthy oils, such as olive, canola, or sunflower oil.  Avoid cooking with butter, cream, or high-fat meats. Meal planning  Eat meals and snacks regularly, preferably at the same times every day. Avoid going long periods of time without eating.  Eat foods high in fiber, such as fresh fruits, vegetables, beans, and whole grains. Talk to your dietitian about how many servings of carbohydrates you can eat at each meal.  Eat 4-6 ounces of lean protein each day, such as lean meat, chicken, fish, eggs, or tofu. 1 ounce is equal to 1 ounce of meat, chicken, or fish, 1 egg, or 1/4 cup of tofu.  Eat some foods each day that contain healthy fats, such as avocado, nuts, seeds, and fish. Lifestyle   Check your blood glucose regularly.  Exercise at least 30 minutes 5 or more days each week, or as told by your health care provider.  Take medicines as told by your health care provider.  Do not use any products that contain nicotine or tobacco, such as cigarettes and e-cigarettes. If you need help quitting, ask your health care provider.  Work with a counselor or diabetes educator to identify strategies to manage stress and any emotional and social challenges. What are some questions to ask my health care provider?  Do I need to meet with a diabetes educator?  Do I need to meet with a dietitian?  What number can I call if I have questions?  When are the best times to check my blood glucose? Where to find more information:  American Diabetes Association: diabetes.org/food-and-fitness/food  Academy of Nutrition and Dietetics:  www.eatright.org/resources/health/diseases-and-conditions/diabetes  National Institute of Diabetes and Digestive and Kidney Diseases (NIH): www.niddk.nih.gov/health-information/diabetes/overview/diet-eating-physical-activity Summary  A healthy meal plan will help you control your blood glucose and maintain a healthy lifestyle.  Working with a diet and nutrition specialist (dietitian) can help you make a meal plan that is best for you.  Keep in mind that carbohydrates and alcohol have immediate effects on your blood glucose levels. It is important to count carbohydrates and to use alcohol carefully. This information is not intended to replace advice given to you by your health care provider. Make sure you discuss any questions you have with your health care provider. Document Released: 06/20/2005 Document Revised: 10/28/2016 Document Reviewed: 10/28/2016 Elsevier Interactive Patient Education  2018 Elsevier Inc.  

## 2018-05-29 NOTE — Progress Notes (Signed)
Patient [name]  Chronic Disease Follow Up Provider: Lanae Boast, FNP  SUBJECTIVE:  Patient presents for follow up for the following  chronic conditions. Patient   has a past medical history of Diabetes mellitus, Hypertension, and Hyperthyroidism.  Patient has been out of the country and without medications for the past 2 months. Presents today for medication refills. Denies problems or concerns. Not following any special diets or exercising.     Review of Systems  Constitutional: Negative.   HENT: Negative.   Eyes: Negative.   Respiratory: Negative.   Cardiovascular: Negative.   Gastrointestinal: Negative.   Genitourinary: Negative.   Musculoskeletal: Negative.   Skin: Negative.   Neurological: Negative.   Psychiatric/Behavioral: Negative.     OBJECTIVE:  BP 140/66 (BP Location: Right Arm, Patient Position: Sitting, Cuff Size: Large)   Pulse 86   Temp 98.9 F (37.2 C) (Oral)   Resp 14   Ht 5\' 6"  (1.676 m)   Wt 162 lb (73.5 kg)   LMP 06/02/2013   SpO2 100%   BMI 26.15 kg/m   Physical Exam  Constitutional: She is oriented to person, place, and time and well-developed, well-nourished, and in no distress. No distress.  HENT:  Head: Normocephalic and atraumatic.  Eyes: Pupils are equal, round, and reactive to light. Conjunctivae and EOM are normal.  Neck: Normal range of motion. Neck supple.  Cardiovascular: Normal rate, regular rhythm and intact distal pulses. Exam reveals no gallop and no friction rub.  No murmur heard. Pulmonary/Chest: Effort normal and breath sounds normal. No respiratory distress. She has no wheezes.  Abdominal: Soft. Bowel sounds are normal. There is no tenderness.  Musculoskeletal: Normal range of motion. She exhibits no edema or tenderness.  Lymphadenopathy:    She has no cervical adenopathy.  Neurological: She is alert and oriented to person, place, and time. Gait normal.  Skin: Skin is warm  and dry.  Psychiatric: Mood, memory, affect and judgment normal.  Nursing note and vitals reviewed.    ASSESSMENT/PLAN:  1. Essential hypertension - Urinalysis Dipstick - amLODipine (NORVASC) 10 MG tablet; Take 1 tablet (10 mg total) by mouth daily.  Dispense: 30 tablet; Refill: 5 - Comprehensive metabolic panel  2. Type 2 diabetes mellitus without complication, without long-term current use of insulin (HCC) - HgB A1c - glimepiride (AMARYL) 4 MG tablet; Take 1 tablet (4 mg total) by mouth daily before breakfast.  Dispense: 30 tablet; Refill: 3 - metFORMIN (GLUCOPHAGE) 1000 MG tablet; Take 0.5 tablets (500 mg total) by mouth 2 (two) times daily with a meal.  Dispense: 180 tablet; Refill: 1 - sitaGLIPtin (JANUVIA) 25 MG tablet; Take 1 tablet (25 mg total) by mouth daily.  Dispense: 30 tablet; Refill: 5 - Comprehensive metabolic panel - Microalbumin, urine  3. Hyperthyroidism - methimazole (TAPAZOLE) 5 MG tablet; Take 1 tablet (5 mg total) by mouth 2 (two) times daily.  Dispense: 60 tablet; Refill: 1 - Thyroid Panel With TSH     Return to care as scheduled and prn. Patient verbalized understanding and agreed with plan of care.   Ms. Doug Sou. Nathaneil Canary, FNP-BC Patient [facility] [place], [place] [address] [phone]    The 2018 Physical Activity Guidelines recommend the equivalent of 150 minutes per week of moderate to vigorous aerobic activity each week, with muscle-strengthening activities on two days during the week    PRESCRIPTION FOR EXERCISE  Frequency Four to five days  per week  Intensity Moderate- During moderate intensity exercise, a person is too winded to sing but is not so winded they cannot talk  Time 30 minutes or a total of 150 minutes per week.   Type Brisk walking PLUS One set each of: 0 Body weight squats  10 0 Plank hold for 30 seconds   Basic bodyweight exercise guide: Squat and plank  (A) Body weight  squat: 0 The squat develops strength in the legs and torso. Stand with your feet about shoulder's width apart and slightly externally rotated. Maintain your weight on your heels or midfoot throughout the exercise. Keep your lower back flat; do not allow it to round or to extend excessively. Your knees should remain aligned with your ankles and legs throughout the motion (knees caving inward towards one another is a common problem). Descend until your hips come just below the level of your knees. If strength or mobility limitations prevent you from reaching this depth, begin with partial squats and gradually increase the depth over time. Once you can perform 15 squats with proper technique and full depth, make the exercise more challenging by holding a weight. (B) Standard plank: 0 The basic plank develops torso (core) strength. Maintain a straight torso, not allowing your lower back to sag or your hips to elevate, throughout the exercise. Gradually increase the time you can hold the position.

## 2018-05-30 LAB — COMPREHENSIVE METABOLIC PANEL
ALT: 21 IU/L (ref 0–32)
AST: 14 IU/L (ref 0–40)
Albumin/Globulin Ratio: 1.3 (ref 1.2–2.2)
Albumin: 4 g/dL (ref 3.5–5.5)
Alkaline Phosphatase: 122 IU/L — ABNORMAL HIGH (ref 39–117)
BUN/Creatinine Ratio: 32 — ABNORMAL HIGH (ref 9–23)
BUN: 11 mg/dL (ref 6–24)
Bilirubin Total: 0.3 mg/dL (ref 0.0–1.2)
CO2: 25 mmol/L (ref 20–29)
Calcium: 9.7 mg/dL (ref 8.7–10.2)
Chloride: 99 mmol/L (ref 96–106)
Creatinine, Ser: 0.34 mg/dL — ABNORMAL LOW (ref 0.57–1.00)
GFR calc Af Amer: 142 mL/min/{1.73_m2} (ref >59)
GFR calc non Af Amer: 123 mL/min/{1.73_m2} (ref >59)
Globulin, Total: 3 g/dL (ref 1.5–4.5)
Glucose: 184 mg/dL — ABNORMAL HIGH (ref 65–99)
Potassium: 4.2 mmol/L (ref 3.5–5.2)
Sodium: 139 mmol/L (ref 134–144)
Total Protein: 7 g/dL (ref 6.0–8.5)

## 2018-05-30 LAB — THYROID PANEL WITH TSH
Free Thyroxine Index: 1.9 (ref 1.2–4.9)
T3 Uptake Ratio: 28 % (ref 24–39)
T4, Total: 6.8 ug/dL (ref 4.5–12.0)
TSH: <0.006 u[IU]/mL — ABNORMAL LOW (ref 0.450–4.500)

## 2018-05-30 LAB — MICROALBUMIN, URINE: Microalbumin, Urine: 9.4 ug/mL

## 2018-07-13 MED FILL — methIMAzole 5 MG TABS: 5 | 30 days supply | Qty: 60 | Fill #0

## 2018-07-13 MED FILL — ?GLIMEPIRIDE 4 MG TABLET: 4 | 30 days supply | Qty: 30 | Fill #0

## 2018-07-13 MED FILL — AMLODIPINE BESYLATE 10 MG T: 10 | 30 days supply | Qty: 30 | Fill #0

## 2018-07-13 MED FILL — $JANUVIA 25 MG TABLET: 25 | 90 days supply | Qty: 90 | Fill #0

## 2018-07-13 MED FILL — ?METFORMIN HCL 1000MG TABS: 1000 | 30 days supply | Qty: 30 | Fill #0

## 2018-08-18 ENCOUNTER — Other Ambulatory Visit: Payer: Self-pay | Admitting: Family Medicine

## 2018-08-18 ENCOUNTER — Other Ambulatory Visit: Payer: Self-pay

## 2018-08-18 DIAGNOSIS — E119 Type 2 diabetes mellitus without complications: Secondary | ICD-10-CM

## 2018-08-18 MED ORDER — GLUCOSE BLOOD VI STRP: ORAL_STRIP | 12 refills | Status: DC

## 2018-08-18 MED FILL — ?GLIMEPIRIDE 4 MG TABLET: 4 | 30 days supply | Qty: 30 | Fill #1

## 2018-08-18 MED FILL — TRUEplus LANCETS 28G MISC: 25 days supply | Qty: 100 | Fill #0

## 2018-08-18 MED FILL — ?METFORMIN HCL 1000MG TABS: 1000 | 30 days supply | Qty: 30 | Fill #1

## 2018-08-18 MED FILL — AMLODIPINE BESYLATE 10 MG T: 10 | 30 days supply | Qty: 30 | Fill #1

## 2018-08-18 MED FILL — TRUE METRIX TEST STRIP: 25 days supply | Qty: 100 | Fill #0

## 2018-08-18 MED FILL — methIMAzole 5 MG TABS: 5 | 30 days supply | Qty: 60 | Fill #1

## 2018-08-31 ENCOUNTER — Ambulatory Visit: Payer: Self-pay | Admitting: Family Medicine

## 2018-10-01 ENCOUNTER — Other Ambulatory Visit: Payer: Self-pay | Admitting: Family Medicine

## 2018-10-01 DIAGNOSIS — E0590 Thyrotoxicosis, unspecified without thyrotoxic crisis or storm: Secondary | ICD-10-CM

## 2018-10-01 MED FILL — ?GLIMEPIRIDE 4 MG TABLET: 4 | 30 days supply | Qty: 30 | Fill #2

## 2018-10-01 MED FILL — ?METFORMIN HCL 1,000 MG TAB: 1000 | 30 days supply | Qty: 30 | Fill #2

## 2018-10-02 MED FILL — methIMAzole 5 MG TABS: 5 | 30 days supply | Qty: 60 | Fill #0

## 2018-10-23 MED FILL — AMLODIPINE BESYLATE 10 MG T: 10 | 30 days supply | Qty: 30 | Fill #2

## 2018-10-23 MED FILL — methIMAzole 5 MG TABS: 5 | 30 days supply | Qty: 60 | Fill #0

## 2018-11-04 ENCOUNTER — Ambulatory Visit: Payer: Self-pay | Admitting: Family Medicine

## 2018-11-20 ENCOUNTER — Ambulatory Visit: Payer: Self-pay | Attending: Family Medicine

## 2018-11-25 ENCOUNTER — Ambulatory Visit: Payer: Self-pay | Admitting: Family Medicine

## 2019-04-16 MED FILL — ?AMLODIPINE BESYLATE 10 MG: 10 | 30 days supply | Qty: 30 | Fill #3

## 2019-04-16 MED FILL — metFORMIN HCL 1000 MG TABS: 1000 | 30 days supply | Qty: 30 | Fill #3

## 2019-04-16 MED FILL — GLIMEPIRIDE 4 MG TABS: 4 | 30 days supply | Qty: 30 | Fill #3

## 2019-04-19 ENCOUNTER — Ambulatory Visit: Payer: Self-pay | Admitting: Family Medicine

## 2019-05-03 ENCOUNTER — Other Ambulatory Visit: Payer: Self-pay

## 2019-05-03 ENCOUNTER — Ambulatory Visit: Payer: Self-pay | Attending: Family Medicine

## 2019-05-11 ENCOUNTER — Telehealth: Payer: Self-pay | Admitting: Family Medicine

## 2019-05-11 MED FILL — metFORMIN HCL 1000 MG TABS: 1000 | 30 days supply | Qty: 30 | Fill #4

## 2019-05-11 MED FILL — ?AMLODIPINE BESYLATE 10 MG: 10 | 30 days supply | Qty: 30 | Fill #4

## 2019-05-11 MED FILL — methIMAzole 5 MG TABS: 5 | 30 days supply | Qty: 60 | Fill #1

## 2019-05-11 NOTE — Telephone Encounter (Signed)
Patient/Guarantor Contact Per application patient and spouse unemployed due to covid 19 ... Patient and spouse  is eligible to draw unemployment.. Patient would need to provide UI declaration page for UI benefits to complete FA process will hold account for 14 days for requested information, Pt was calld and informed, she will bring the letter from UI and the pre-paid card

## 2019-05-12 ENCOUNTER — Other Ambulatory Visit: Payer: Self-pay

## 2019-05-12 DIAGNOSIS — K219 Gastro-esophageal reflux disease without esophagitis: Secondary | ICD-10-CM

## 2019-05-12 MED ORDER — OMEPRAZOLE 20 MG PO CPDR: 20.0000 mg | DELAYED_RELEASE_CAPSULE | Freq: Every day | ORAL | 3 refills | Status: DC

## 2019-05-13 ENCOUNTER — Ambulatory Visit: Payer: Self-pay | Admitting: Family Medicine

## 2019-06-16 ENCOUNTER — Encounter (HOSPITAL_COMMUNITY): Payer: Self-pay

## 2019-06-16 ENCOUNTER — Encounter (HOSPITAL_COMMUNITY): Payer: Self-pay | Admitting: *Deleted

## 2019-06-16 ENCOUNTER — Telehealth: Payer: Self-pay

## 2019-06-16 NOTE — Telephone Encounter (Signed)
Received [facility] Called patient, she will need to make an appointment before refills can be given. She hasn't been seen in greater than 1 year. Thanks!

## 2019-11-08 ENCOUNTER — Encounter: Payer: Self-pay | Admitting: Nurse Practitioner

## 2019-11-08 ENCOUNTER — Ambulatory Visit (INDEPENDENT_AMBULATORY_CARE_PROVIDER_SITE_OTHER): Payer: Self-pay | Admitting: Nurse Practitioner

## 2019-11-08 ENCOUNTER — Other Ambulatory Visit: Payer: Self-pay

## 2019-11-08 VITALS — BP 138/68 | HR 94 | Temp 98.7°F | Resp 16 | Ht 66.0 in | Wt 160.0 lb

## 2019-11-08 DIAGNOSIS — G8929 Other chronic pain: Secondary | ICD-10-CM

## 2019-11-08 DIAGNOSIS — K219 Gastro-esophageal reflux disease without esophagitis: Secondary | ICD-10-CM

## 2019-11-08 DIAGNOSIS — Z0000 Encounter for general adult medical examination without abnormal findings: Secondary | ICD-10-CM

## 2019-11-08 DIAGNOSIS — E079 Disorder of thyroid, unspecified: Secondary | ICD-10-CM

## 2019-11-08 DIAGNOSIS — R7989 Other specified abnormal findings of blood chemistry: Secondary | ICD-10-CM

## 2019-11-08 DIAGNOSIS — R1032 Left lower quadrant pain: Secondary | ICD-10-CM

## 2019-11-08 DIAGNOSIS — I10 Essential (primary) hypertension: Secondary | ICD-10-CM

## 2019-11-08 DIAGNOSIS — E119 Type 2 diabetes mellitus without complications: Secondary | ICD-10-CM

## 2019-11-08 LAB — POCT URINALYSIS DIPSTICK
Bilirubin, UA: NEGATIVE
Glucose, UA: POSITIVE — AB
Ketones, UA: NEGATIVE
Leukocytes, UA: NEGATIVE
Nitrite, UA: NEGATIVE
Protein, UA: NEGATIVE
Spec Grav, UA: 1.02 (ref 1.010–1.025)
Urobilinogen, UA: 0.2 E.U./dL
pH, UA: 5 (ref 5.0–8.0)

## 2019-11-08 LAB — POCT GLYCOSYLATED HEMOGLOBIN (HGB A1C): Hemoglobin A1C: 10.5 % — AB (ref 4.0–5.6)

## 2019-11-08 MED ORDER — METFORMIN HCL 500 MG PO TABS: 500.0000 mg | ORAL_TABLET | Freq: Two times a day (BID) | ORAL | 0 refills | Status: DC

## 2019-11-08 MED ORDER — PANTOPRAZOLE SODIUM 40 MG PO TBEC: 40.0000 mg | DELAYED_RELEASE_TABLET | Freq: Every day | ORAL | 3 refills | Status: DC

## 2019-11-08 MED ORDER — GLIMEPIRIDE 4 MG PO TABS: 4.0000 mg | ORAL_TABLET | Freq: Every day | ORAL | 3 refills | Status: DC

## 2019-11-08 MED ORDER — SITAGLIPTIN PHOSPHATE 25 MG PO TABS: 25.0000 mg | ORAL_TABLET | Freq: Every day | ORAL | 5 refills | Status: DC

## 2019-11-08 MED FILL — GLIMEPIRIDE 4 MG TABS: 4 | 30 days supply | Qty: 30 | Fill #0

## 2019-11-08 MED FILL — metFORMIN HCL 500 MG TABS: 500 | 30 days supply | Qty: 60 | Fill #0

## 2019-11-08 MED FILL — PANTOPRAZOLE SOD DR 40 MG T: 40 | 30 days supply | Qty: 30 | Fill #0

## 2019-11-08 MED FILL — JANUVIA 25 MG TABLET: 25 | 30 days supply | Qty: 30 | Fill #0

## 2019-11-08 NOTE — Progress Notes (Signed)
Established Patient Office Visit  Subjective:  Patient ID: Crystal [name], female    DOB: 1961/09/10  Age: 59 y.o. MRN: [id number]  CC:  Chief Complaint  Patient presents with  . Hypertension  . Diabetes    HPI Crystal [name] presents for follow-up.  She has a history of diabetes, hypothyroidism, hypertensio and GERD.  She has not been seen in over a year.  She admits that she has only been taking Metformin at half the dose.  She has been off the glimepiride and the Januvia.  She has not been monitoring blood glucose or her diet.  She is aware that she needs to make a lot of changes and follow-up regularly.  Her current hemoglobin A1c 10.5 which is up from 9.8 in 2019. She admits that she has been daily increased spicy foods.  This is to help combat COVID-19.  She has noticed increased epigastric and left lower abdominal pain.  She admits that she has had vague lower abdominal pain for quite some time she did have an ultrasound that was negative.  She feels like the pain is never really resolved but does continue to get worse.  She denies any nausea, vomiting, constipation or diarrhea.  She denies any blood in her stool.  She has noticed burning with urination but feels this is related to the spicy foods.  She denies burning with bowel movements.  Past Medical History:  Diagnosis Date  . Diabetes mellitus   . Hypertension   . Hyperthyroidism     Past Surgical History:  Procedure Laterality Date  . ANTERIOR CERVICAL DECOMP/DISCECTOMY FUSION N/A 10/11/2016   Procedure: ANTERIOR CERVICAL DISCECTOMY FUSION C6-7 WITH EXCISION OF OSTEOPHYTE;  Surgeon: [name], MD;  Location: [facility];  Service: Orthopedics;  Laterality: N/A;  . CESAREAN SECTION    . TUBAL LIGATION      History reviewed. No pertinent family history.  Social History   Socioeconomic History  . Marital status: Married    Spouse name: Not on file  . Number of children: Not on file  . Years of education: Not on file  .  Highest education level: Not on file  Occupational History  . Not on file  Tobacco Use  . Smoking status: Never Smoker  . Smokeless tobacco: Never Used  Substance and Sexual Activity  . Alcohol use: No  . Drug use: No  . Sexual activity: Not on file  Other Topics Concern  . Not on file  Social History Narrative  . Not on file   Social Determinants of Health   Financial Resource Strain:   . Difficulty of Paying Living Expenses: Not on file  Food Insecurity:   . Worried About Charity fundraiser in the Last Year: Not on file  . Ran Out of Food in the Last Year: Not on file  Transportation Needs:   . Lack of Transportation (Medical): Not on file  . Lack of Transportation (Non-Medical): Not on file  Physical Activity:   . Days of Exercise per Week: Not on file  . Minutes of Exercise per Session: Not on file  Stress:   . Feeling of Stress : Not on file  Social Connections:   . Frequency of Communication with Friends and Family: Not on file  . Frequency of Social Gatherings with Friends and Family: Not on file  . Attends Religious Services: Not on file  . Active Member of Clubs or Organizations: Not on file  . Attends Club or  Organization Meetings: Not on file  . Marital Status: Not on file  Intimate Partner Violence:   . Fear of Current or Ex-Partner: Not on file  . Emotionally Abused: Not on file  . Physically Abused: Not on file  . Sexually Abused: Not on file    Outpatient Medications Prior to Visit  Medication Sig Dispense Refill  . acetaminophen (TYLENOL) 500 MG tablet Take 1,000 mg by mouth daily as needed for moderate pain.     [name] amLODipine (NORVASC) 10 MG tablet Take 1 tablet (10 mg total) by mouth daily. 30 tablet 5  . Blood Glucose Monitoring Suppl (TRUE METRIX METER) w/Device KIT USE AS DIRECTED IN THE MORNING. 1 kit 1  . loratadine (CLARITIN) 10 MG tablet Take 1 tablet (10 mg total) by mouth daily. (Patient taking differently: Take 10 mg by mouth daily as  needed for allergies. ) 30 tablet 11  . methimazole (TAPAZOLE) 5 MG tablet TAKE 1 TABLET BY MOUTH 2 TIMES DAILY. 60 tablet 1  . TRUEPLUS LANCETS 28G MISC USE AS INSTRUCTED 100 each 5  . glimepiride (AMARYL) 4 MG tablet Take 1 tablet (4 mg total) by mouth daily before breakfast. 30 tablet 3  . metFORMIN (GLUCOPHAGE) 1000 MG tablet Take 0.5 tablets (500 mg total) by mouth 2 (two) times daily with a meal. 180 tablet 1  . omeprazole (PRILOSEC) 20 MG capsule Take 1 capsule (20 mg total) by mouth daily. 30 capsule 3  . sitaGLIPtin (JANUVIA) 25 MG tablet Take 1 tablet (25 mg total) by mouth daily. 30 tablet 5  . glucose blood (TRUE METRIX BLOOD GLUCOSE TEST) test strip Use as instructed 100 each 12  . baclofen (LIORESAL) 10 MG tablet Take 1 tablet (10 mg total) by mouth 3 (three) times daily. (Patient not taking: Reported on 05/29/2018) 30 each 0  . metaxalone (SKELAXIN) 800 MG tablet Take 1 tablet (800 mg total) 3 (three) times daily by mouth. (Patient not taking: Reported on 05/29/2018) 30 tablet 0   Facility-Administered Medications Prior to Visit  Medication Dose Route Frequency Provider Last Rate Last Admin  . MEDERMA gel   Topical Once [name], MD        Allergies  Allergen Reactions  . Aspirin Nausea And Vomiting  . Tramadol     Upset stomach     ROS Review of Systems  Constitutional: Negative.   HENT: Negative.   Eyes: Negative.   Respiratory: Negative.   Cardiovascular: Negative.   Gastrointestinal: Positive for abdominal pain.       Epigastric left lower quadrant  Endocrine: Negative.   Genitourinary: Negative.   Musculoskeletal: Negative.   Skin: Negative.   Allergic/Immunologic: Negative.   Neurological: Negative.   Hematological: Negative.   Psychiatric/Behavioral: Negative.       Objective:    Physical Exam  Constitutional: She is oriented to person, place, and time. She appears well-developed and well-nourished.  HENT:  Head: Normocephalic.    Cardiovascular: Normal rate, regular rhythm and normal heart sounds.  Pulmonary/Chest: Effort normal and breath sounds normal.  Abdominal: Soft. Bowel sounds are normal.  Musculoskeletal:        General: Normal range of motion.  Neurological: She is alert and oriented to person, place, and time.  Skin: Skin is warm and dry.     Psychiatric: She has a normal mood and affect. Her behavior is normal. Judgment and thought content normal.    BP 138/68 (BP Location: Right Arm, Patient Position: Sitting, Cuff Size: Large)  Comment: manually  Pulse 94   Temp 98.7 F (37.1 C) (Oral)   Resp 16   Ht '5\' 6"'$  (1.676 m)   Wt 160 lb (72.6 kg)   LMP 06/02/2013   SpO2 100%   BMI 25.82 kg/m  Wt Readings from Last 3 Encounters:  11/08/19 160 lb (72.6 kg)  05/29/18 162 lb (73.5 kg)  01/29/18 170 lb (77.1 kg)     Health Maintenance Due  Topic Date Due  . OPHTHALMOLOGY EXAM  01/30/1972  . PAP SMEAR-Modifier  10/10/2008  . COLONOSCOPY  01/30/2012  . FOOT EXAM  08/14/2018  . MAMMOGRAM  09/10/2018  . URINE MICROALBUMIN  05/30/2019    There are no preventive care reminders to display for this patient.  Lab Results  Component Value Date   TSH <0.006 (L) 05/29/2018   Lab Results  Component Value Date   WBC 7.1 11/08/2016   HGB 12.2 11/08/2016   HCT 39.3 11/08/2016   MCV 77.1 (L) 11/08/2016   PLT 353 11/08/2016   Lab Results  Component Value Date   NA 139 05/29/2018   K 4.2 05/29/2018   CO2 25 05/29/2018   GLUCOSE 184 (H) 05/29/2018   BUN 11 05/29/2018   CREATININE 0.34 (L) 05/29/2018   BILITOT 0.3 05/29/2018   ALKPHOS 122 (H) 05/29/2018   AST 14 05/29/2018   ALT 21 05/29/2018   PROT 7.0 05/29/2018   ALBUMIN 4.0 05/29/2018   CALCIUM 9.7 05/29/2018   ANIONGAP 10 11/08/2016   Lab Results  Component Value Date   CHOL 209 (H) 09/25/2007   Lab Results  Component Value Date   HDL 44 09/25/2007   Lab Results  Component Value Date   LDLCALC 134 (H) 09/25/2007   Lab  Results  Component Value Date   TRIG 155 (H) 09/25/2007   Lab Results  Component Value Date   CHOLHDL 4.8 Ratio 09/25/2007   Lab Results  Component Value Date   HGBA1C 10.5 (A) 11/08/2019      Assessment & Plan:   Problem List Items Addressed This Visit      Unprioritized   Diabetes mellitus ([place])   Relevant Medications   metFORMIN (GLUCOPHAGE) 500 MG tablet   sitaGLIPtin (JANUVIA) 25 MG tablet   glimepiride (AMARYL) 4 MG tablet   Other Relevant Orders   Urinalysis Dipstick (Completed)   Microalbumin, urine   HgB A1c (Completed)   Lipid Panel   Comp. Metabolic Panel (12)   Disorder of thyroid   Relevant Orders   TSH   GERD   Relevant Medications   pantoprazole (PROTONIX) 40 MG tablet   Hypertension - Primary   Relevant Orders   Urinalysis Dipstick (Completed)   Microalbumin, urine   Comp. Metabolic Panel (12)   Comprehensive metabolic panel    Other Visit Diagnoses    Healthcare maintenance       Relevant Orders   Vitamin D, 25-hydroxy   Magnesium   Vitamin B12   Abdominal pain, chronic, left lower quadrant       Protonix started Encourage patient to decrease spicy foods Continue to monitor pain and discomfort if no change will reevaluate with ultrasound and GI referra      Meds ordered this encounter  Medications  . metFORMIN (GLUCOPHAGE) 500 MG tablet    Sig: Take 1 tablet (500 mg total) by mouth 2 (two) times daily with a meal.    Dispense:  180 tablet    Refill:  0    Order Specific  Question:   Supervising Provider    Answer:   Tresa Garter [[id number]]  . pantoprazole (PROTONIX) 40 MG tablet    Sig: Take 1 tablet (40 mg total) by mouth daily.    Dispense:  30 tablet    Refill:  3    Order Specific Question:   Supervising Provider    Answer:   Tresa Garter [id number]  . sitaGLIPtin (JANUVIA) 25 MG tablet    Sig: Take 1 tablet (25 mg total) by mouth daily.    Dispense:  30 tablet    Refill:  5    Order Specific Question:    Supervising Provider    Answer:   Tresa Garter [id number]  . glimepiride (AMARYL) 4 MG tablet    Sig: Take 1 tablet (4 mg total) by mouth daily before breakfast.    Dispense:  30 tablet    Refill:  3    Order Specific Question:   Supervising Provider    Answer:   Tresa Garter [[id number]]    Follow-up: Return in 3 months (on 02/05/2020).    Vevelyn Francois, NP

## 2019-11-08 NOTE — Patient Instructions (Addendum)
Preventing Cerebrovascular Disease  Arteries are blood vessels that carry blood that contains oxygen from the heart to all parts of the body. Cerebrovascular disease affects arteries that supply the brain. Any condition that blocks or disrupts blood flow to the brain can cause cerebrovascular disease. Brain cells that lose blood supply start to die within minutes (stroke). Stroke is the main danger of cerebrovascular disease. Atherosclerosis and high blood pressure are common causes of cerebrovascular disease. Atherosclerosis is narrowing and hardening of an artery that results when fat, cholesterol, calcium, or other substances (plaque) build up inside an artery. Plaque reduces blood flow through the artery. High blood pressure increases the risk of bleeding inside the brain. Making diet and lifestyle changes to prevent atherosclerosis and high blood pressure lowers your risk of cerebrovascular disease. What nutrition changes can be made?  Eat more fruits, vegetables, and whole grains.  Reduce how much saturated fat you eat. To do this, eat less red meat and fewer full-fat dairy products.  Eat healthy proteins instead of red meat. Healthy proteins include: ? Fish. Eat fish that contains heart-healthy omega-3 fatty acids, twice a week. Examples include salmon, albacore tuna, mackerel, and herring. ? Chicken. ? Nuts. ? Low-fat or nonfat yogurt.  Avoid processed meats, like bacon and lunchmeat.  Avoid foods that contain: ? A lot of sugar, such as sweets and drinks with added sugar. ? A lot of salt (sodium). Avoid adding extra salt to your food, as told by your health care provider. ? Trans fats, such as margarine and baked goods. Trans fats may be listed as "partially hydrogenated oils" on food labels.  Check food labels to see how much sodium, sugar, and trans fats are in foods.  Use vegetable oils that contain low amounts of saturated fat, such as olive oil or canola oil. What lifestyle  changes can be made?  Drink alcohol in moderation. This means no more than 1 drink a day for nonpregnant women and 2 drinks a day for men. One drink equals 12 oz of beer, 5 oz of wine, or 1 oz of hard liquor.  If you are overweight, ask your health care provider to recommend a weight-loss plan for you. Losing 5-10 lb (2.2-4.5 kg) can reduce your risk of diabetes, atherosclerosis, and high blood pressure.  Exercise for 30?60 minutes on most days, or as much as told by your health care provider. ? Do moderate-intensity exercise, such as brisk walking, bicycling, and water aerobics. Ask your health care provider which activities are safe for you.   Do not use any products that contain nicotine or tobacco, such as cigarettes and e-cigarettes. If you need help quitting, ask your health care provider. Why are these changes important? Making these changes lowers your risk of many diseases that can cause cerebrovascular disease and stroke. Stroke is a leading cause of death and disability. Making these changes also improves your overall health and quality of life. What can I do to lower my risk? The following factors make you more likely to develop cerebrovascular disease:  Being overweight.  Smoking.  Being physically inactive.  Eating a high-fat diet.  Having certain health conditions, such as: ? Diabetes. ? High blood pressure. ? Heart disease. ? Atherosclerosis. ? High cholesterol. ? Sickle cell disease.  Talk with your health care provider about your risk for cerebrovascular disease. Work with your health care provider to control diseases that you have that may contribute to cerebrovascular disease. Your health care provider may prescribe medicines to  help prevent major causes of cerebrovascular disease. Where to find more information Learn more about preventing cerebrovascular disease from:  [place], Lung, and [place]:  Mo[employer]de  Centers for Disease Control and Prevention: http://www.curry-wood.biz/ Summary  Cerebrovascular disease can lead to a stroke.  Atherosclerosis and high blood pressure are major causes of cerebrovascular disease.  Making diet and lifestyle changes can reduce your risk of cerebrovascular disease.  Work with your health care provider to get your risk factors under control to reduce your risk of cerebrovascular disease. This information is not intended to replace advice given to you by your health care provider. Make sure you discuss any questions you have with your health care provider. Document Revised: 09/05/2017 Document Reviewed: 10/08/2015 Elsevier Patient Education  2020 [place] diabetes mellitus type 2 patient instructions here.

## 2019-11-10 LAB — COMPREHENSIVE METABOLIC PANEL
ALT: 29 IU/L (ref 0–32)
AST: 17 IU/L (ref 0–40)
Albumin/Globulin Ratio: 1.4 (ref 1.2–2.2)
Albumin: 4.1 g/dL (ref 3.8–4.9)
Alkaline Phosphatase: 114 IU/L (ref 39–117)
BUN/Creatinine Ratio: 38 — ABNORMAL HIGH (ref 9–23)
BUN: 14 mg/dL (ref 6–24)
Bilirubin Total: 0.3 mg/dL (ref 0.0–1.2)
CO2: 22 mmol/L (ref 20–29)
Calcium: 10.1 mg/dL (ref 8.7–10.2)
Chloride: 103 mmol/L (ref 96–106)
Creatinine, Ser: 0.37 mg/dL — ABNORMAL LOW (ref 0.57–1.00)
GFR calc Af Amer: 137 mL/min/{1.73_m2} (ref >59)
GFR calc non Af Amer: 119 mL/min/{1.73_m2} (ref >59)
Globulin, Total: 2.9 g/dL (ref 1.5–4.5)
Glucose: 170 mg/dL — ABNORMAL HIGH (ref 65–99)
Potassium: 4.3 mmol/L (ref 3.5–5.2)
Sodium: 141 mmol/L (ref 134–144)
Total Protein: 7 g/dL (ref 6.0–8.5)

## 2019-11-10 LAB — VITAMIN D 25 HYDROXY (VIT D DEFICIENCY, FRACTURES): Vit D, 25-Hydroxy: 21.8 ng/mL — ABNORMAL LOW (ref 30.0–100.0)

## 2019-11-10 LAB — LIPID PANEL
Chol/HDL Ratio: 5.1 ratio — ABNORMAL HIGH (ref 0.0–4.4)
Cholesterol, Total: 241 mg/dL — ABNORMAL HIGH (ref 100–199)
HDL: 47 mg/dL (ref >39)
LDL Chol Calc (NIH): 167 mg/dL — ABNORMAL HIGH (ref 0–99)
Triglycerides: 149 mg/dL (ref 0–149)
VLDL Cholesterol Cal: 27 mg/dL (ref 5–40)

## 2019-11-10 LAB — MICROALBUMIN, URINE: Microalbumin, Urine: 22.3 ug/mL

## 2019-11-10 LAB — TSH: TSH: <0.005 u[IU]/mL — ABNORMAL LOW (ref 0.450–4.500)

## 2019-11-10 LAB — MAGNESIUM: Magnesium: 1.9 mg/dL (ref 1.6–2.3)

## 2019-11-10 LAB — VITAMIN B12: Vitamin B-12: 748 pg/mL (ref 232–1245)

## 2019-11-10 NOTE — Addendum Note (Signed)
Addended by: Adelina Mings on: 11/10/2019 03:25 PM   Modules accepted: Orders

## 2019-11-12 ENCOUNTER — Other Ambulatory Visit: Payer: Self-pay | Admitting: Nurse Practitioner

## 2019-11-12 LAB — SPECIMEN STATUS REPORT

## 2019-11-12 LAB — T3, FREE: T3, Free: 4.7 pg/mL — ABNORMAL HIGH (ref 2.0–4.4)

## 2019-11-12 LAB — T4, FREE: Free T4: 1.8 ng/dL — ABNORMAL HIGH (ref 0.82–1.77)

## 2019-11-12 MED ORDER — SIMVASTATIN 10 MG PO TABS: 10.0000 mg | ORAL_TABLET | Freq: Every evening | ORAL | 11 refills | Status: DC

## 2019-11-12 MED FILL — SIMVASTATIN 10 MG TABLET: 10 | 30 days supply | Qty: 30 | Fill #0

## 2019-11-15 ENCOUNTER — Telehealth: Payer: Self-pay

## 2019-11-15 NOTE — Telephone Encounter (Signed)
Called, Using Language resources ID [phone]. No answer, interpreter left a message to call back regarding lab results. Thanks!

## 2019-11-15 NOTE — Telephone Encounter (Signed)
-----   Message from Vevelyn Francois, NP sent at 11/12/2019  4:22 PM EST ----- Notify the patient to see if she is ever seeing endocrinology for her thyroid.  I see which she had a right-sided thyroidectomy years ago. We have already discussed her hemoglobin A1c 10.5.  She admits that she will take her meds, to lifestyle modification with diet and exercise Her cholesterol is elevated at 241 with her LDL being 167.  She needs to start treatment for this to help protect her heart.  I will send in simvastatin 10 mg nightly  Vitamin D continues to be low however she can use vitamin D 5000 units over-the-counter daily will recheck in 1 year

## 2019-11-15 NOTE — Telephone Encounter (Signed)
Called, using interpreter [phone]. No answer. Interpreter left a message asking pt to call back to our office regarding lab results. Thanks!

## 2019-11-16 NOTE — Telephone Encounter (Signed)
Called using ID [id number]. No answer. Left a message through interpreter to call back regarding labs. Thanks!

## 2019-11-18 NOTE — Telephone Encounter (Signed)
Unable to reach pt by phone. Will mail letter. Thanks!

## 2019-11-19 ENCOUNTER — Other Ambulatory Visit: Payer: Self-pay | Admitting: Nurse Practitioner

## 2019-11-19 ENCOUNTER — Encounter: Payer: Self-pay | Admitting: Nurse Practitioner

## 2019-11-19 ENCOUNTER — Telehealth: Payer: Self-pay

## 2019-11-19 DIAGNOSIS — E785 Hyperlipidemia, unspecified: Secondary | ICD-10-CM | POA: Insufficient documentation

## 2019-11-19 DIAGNOSIS — E079 Disorder of thyroid, unspecified: Secondary | ICD-10-CM

## 2019-11-19 DIAGNOSIS — E042 Nontoxic multinodular goiter: Secondary | ICD-10-CM

## 2019-11-19 NOTE — Telephone Encounter (Signed)
Patient returned call. I advised of labs and recommendations to start simvastatin 10mg  and otc vitamin D 5000 units once daily. Patient states that she has not seen any specialists for thyroid in a couple of years and not taken any medications for thyroid for 8 months. Thanks!

## 2019-12-13 ENCOUNTER — Other Ambulatory Visit: Payer: Self-pay

## 2019-12-15 ENCOUNTER — Ambulatory Visit (INDEPENDENT_AMBULATORY_CARE_PROVIDER_SITE_OTHER): Payer: 59 | Admitting: Internal Medicine

## 2019-12-15 ENCOUNTER — Other Ambulatory Visit: Payer: Self-pay

## 2019-12-15 ENCOUNTER — Encounter: Payer: Self-pay | Admitting: Internal Medicine

## 2019-12-15 VITALS — BP 120/60 | HR 111 | Temp 98.5°F | Ht 66.0 in | Wt 161.8 lb

## 2019-12-15 DIAGNOSIS — E1165 Type 2 diabetes mellitus with hyperglycemia: Secondary | ICD-10-CM

## 2019-12-15 DIAGNOSIS — E042 Nontoxic multinodular goiter: Secondary | ICD-10-CM | POA: Diagnosis not present

## 2019-12-15 DIAGNOSIS — E0520 Thyrotoxicosis with toxic multinodular goiter without thyrotoxic crisis or storm: Secondary | ICD-10-CM

## 2019-12-15 DIAGNOSIS — E0590 Thyrotoxicosis, unspecified without thyrotoxic crisis or storm: Secondary | ICD-10-CM | POA: Diagnosis not present

## 2019-12-15 MED ORDER — METHIMAZOLE 10 MG PO TABS: 20.0000 mg | ORAL_TABLET | Freq: Every day | ORAL | 6 refills | Status: DC

## 2019-12-15 MED ORDER — ATENOLOL 25 MG PO TABS: 25.0000 mg | ORAL_TABLET | Freq: Every day | ORAL | 0 refills | Status: DC

## 2019-12-15 NOTE — Progress Notes (Signed)
Name: Crystal [name]  MRN/ DOB: [id number], 1960/10/12    Age/ Sex: 59 y.o., female    PCP: Lanae Boast, FNP   Reason for Endocrinology Evaluation: MNG     Date of Initial Endocrinology Evaluation: 12/15/2019     HPI: Ms. Katherleen Folkes is a 59 y.o. female with a past medical history of T2DM, HTN and MNG. The patient presented for initial endocrinology clinic visit on 12/15/2019 for consultative assistance with her MNG   Pt has been diagnosed with MNG years ago, she is S/P left lobectomy in 1992 with benign pathology.  The patient, these records are not available as the surgery was performed in [place].  in 2006 she was noted to have an enlarged right lobe during physical examination, which prompted a thyroid ultrasound revealing multiple thyroid nodules.  By 2014 she had developed new right thyroid lobe nodules, and was noted to have 2 small nodules at the left thyroid bed but were too small to biopsy.     In 01/2013 she had an FNA of the right superior nodule as well as of the right middle nodule with benign cytology.  Stability of the thyroid nodules were confirmed in 2016.  Patient has been noted to have subclinical hyperthyroidism since 2014.  She was written a prescription of methimazole in 2019, but admits she has not taken this in over a year, as she did not feel it was effective.   Today she continues to have weight loss that she has noted approximately 3 years ago, she has occasional palpitations and insomnia. Patient denies diarrhea but has chronic constipation Patient does endorse local neck enlargement, but denies pain or dysphagia at this time.   Her TSH was suppressed to<0.005 uIU/mL with elevated free T4 at 1.8 ng/dl and elevated free T3 at 4.7 pg/mL.Jacqulyn Liner FH of thyroid disease   HISTORY:  Past Medical History:  Past Medical History:  Diagnosis Date  . Diabetes mellitus   . Hypertension   . Hyperthyroidism    Past Surgical History:  Past Surgical History:   Procedure Laterality Date  . ANTERIOR CERVICAL DECOMP/DISCECTOMY FUSION N/A 10/11/2016   Procedure: ANTERIOR CERVICAL DISCECTOMY FUSION C6-7 WITH EXCISION OF OSTEOPHYTE;  Surgeon: [name], MD;  Location: [facility];  Service: Orthopedics;  Laterality: N/A;  . CESAREAN SECTION    . TUBAL LIGATION        Social History:  reports that she has never smoked. She has never used smokeless tobacco. She reports that she does not drink alcohol or use drugs.  Family History: family history is not on file.   HOME MEDICATIONS: Allergies as of 12/15/2019      Reactions   Aspirin Nausea And Vomiting   Tramadol    Upset stomach       Medication List       Accurate as of December 15, 2019  3:25 PM. If you have any questions, ask your nurse or doctor.        acetaminophen 500 MG tablet Commonly known as: TYLENOL Take 1,000 mg by mouth daily as needed for moderate pain.   amLODipine 10 MG tablet Commonly known as: NORVASC Take 1 tablet (10 mg total) by mouth daily.   glimepiride 4 MG tablet Commonly known as: AMARYL Take 1 tablet (4 mg total) by mouth daily before breakfast.   glucose blood test strip Commonly known as: True Metrix Blood Glucose Test Use as instructed   loratadine 10 MG tablet Commonly known as:  CLARITIN Take 1 tablet (10 mg total) by mouth daily. What changed:   when to take this  reasons to take this   metFORMIN 500 MG tablet Commonly known as: GLUCOPHAGE Take 1 tablet (500 mg total) by mouth 2 (two) times daily with a meal.   methimazole 5 MG tablet Commonly known as: TAPAZOLE TAKE 1 TABLET BY MOUTH 2 TIMES DAILY.   pantoprazole 40 MG tablet Commonly known as: PROTONIX Take 1 tablet (40 mg total) by mouth daily.   simvastatin 10 MG tablet Commonly known as: Zocor Take 1 tablet (10 mg total) by mouth every evening.   sitaGLIPtin 25 MG tablet Commonly known as: Januvia Take 1 tablet (25 mg total) by mouth daily.   True Metrix Meter w/Device  Kit USE AS DIRECTED IN THE MORNING.   TRUEplus Lancets 28G Misc USE AS INSTRUCTED         REVIEW OF SYSTEMS: A comprehensive ROS was conducted with the patient and is negative except as per HPI     OBJECTIVE:  VS: BP 120/60   Pulse (!) 111   Temp 98.5 F (36.9 C) (Oral)   Ht '5\' 6"'$  (1.676 m)   Wt 161 lb 12.8 oz (73.4 kg)   LMP 06/02/2013   SpO2 98%   BMI 26.12 kg/m    Wt Readings from Last 3 Encounters:  12/15/19 161 lb 12.8 oz (73.4 kg)  11/08/19 160 lb (72.6 kg)  05/29/18 162 lb (73.5 kg)     EXAM: General: Pt appears well and is in NAD  Eyes: External eye exam normal without stare, lid lag or exophthalmos.  EOM intact.    Neck: General: Supple without adenopathy. Thyroid: Significantly enlarged right thyroid lobe with multiple Quelicin nodules, no nodules were felt on the left.  Lungs: Clear with good BS bilat with no rales, rhonchi, or wheezes  Heart: Auscultation: Tachycardic  Abdomen: Normoactive bowel sounds, soft, nontender, without masses or organomegaly palpable  Extremities:  BL LE: No pretibial edema normal ROM and strength.  Skin: Hair: Texture and amount normal with gender appropriate distribution Skin Inspection: No rashes Skin Palpation: Skin temperature, texture, and thickness normal to palpation  Neuro: Cranial nerves: II - XII grossly intact  Motor: Normal strength throughout DTRs: 2+ and symmetric in UE without delay in relaxation phase  Mental Status: Judgment, insight: Intact Orientation: Oriented to time, place, and person Mood and affect: No depression, anxiety, or agitation     DATA REVIEWED: Results for NAZARIAH, CADET (MRN [id number]) as of 12/16/2019 21:15  Ref. Range 12/15/2019 15:58  Sodium Latest Ref Range: 135 - 145 mEq/L 136  Potassium Latest Ref Range: 3.5 - 5.1 mEq/L 3.9  Chloride Latest Ref Range: 96 - 112 mEq/L 101  CO2 Latest Ref Range: 19 - 32 mEq/L 24  Glucose Latest Ref Range: 70 - 99 mg/dL 329 (H)  BUN Latest Ref  Range: 6 - 23 mg/dL 15  Creatinine Latest Ref Range: 0.40 - 1.20 mg/dL 0.39 (L)  Calcium Latest Ref Range: 8.4 - 10.5 mg/dL 9.1  Alkaline Phosphatase Latest Ref Range: 39 - 117 U/L 101  Albumin Latest Ref Range: 3.5 - 5.2 g/dL 3.7  AST Latest Ref Range: 0 - 37 U/L 16  ALT Latest Ref Range: 0 - 35 U/L 30  Total Protein Latest Ref Range: 6.0 - 8.3 g/dL 7.2  Total Bilirubin Latest Ref Range: 0.2 - 1.2 mg/dL 0.4  GFR Latest Ref Range: >60.00 mL/min 204.34  WBC Latest Ref Range: 4.0 -  10.5 K/uL 7.9  RBC Latest Ref Range: 3.87 - 5.11 Mil/uL 5.36 (H)  Hemoglobin Latest Ref Range: 12.0 - 15.0 g/dL 12.5  HCT Latest Ref Range: 36.0 - 46.0 % 39.1  MCV Latest Ref Range: 78.0 - 100.0 fl 72.9 (L)  MCHC Latest Ref Range: 30.0 - 36.0 g/dL 32.0  RDW Latest Ref Range: 11.5 - 15.5 % 15.2  Platelets Latest Ref Range: 150.0 - 400.0 K/uL 383.0  Neutrophils Latest Ref Range: 43.0 - 77.0 % 61.0  Lymphocytes Latest Ref Range: 12.0 - 46.0 % 30.1  Monocytes Relative Latest Ref Range: 3.0 - 12.0 % 6.7  Eosinophil Latest Ref Range: 0.0 - 5.0 % 1.2  Basophil Latest Ref Range: 0.0 - 3.0 % 1.0  NEUT# Latest Ref Range: 1.4 - 7.7 K/uL 4.8  Lymphocyte # Latest Ref Range: 0.7 - 4.0 K/uL 2.4  Monocyte # Latest Ref Range: 0.1 - 1.0 K/uL 0.5  Eosinophils Absolute Latest Ref Range: 0.0 - 0.7 K/uL 0.1  Basophils Absolute Latest Ref Range: 0.0 - 0.1 K/uL 0.1  TSH Latest Ref Range: 0.35 - 4.50 uIU/mL <0.01 (L)  Triiodothyronine (T3) Latest Ref Range: 76 - 181 ng/dL 133  T4,Free(Direct) Latest Ref Range: 0.60 - 1.60 ng/dL 1.50      ASSESSMENT/PLAN/RECOMMENDATIONS:    1. 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 -Has had a prescription of methimazole but has not been compliant with it. -Given the size of her nodules as well as hypothyroid status I have recommended proceeding with  right thyroid lobectomy, I did explain to her that she will need to be on thyroid hormone replacement therapy for life following the surgery. -In the meantime she will be started again on methimazole to bring her thyroid function to euthyroid levels again, I explained the importance of euthyroid status prior to proceeding with surgery to prevent thyroid stone. - She was initially instructed to take methimazole 2 tabs daily but her TFT's have improved and I am going to reduce it to 1 tablet daily   Medications  Methimazole 10 mg, 1 tablet  daily Atenolol 25 mg daily    2. Multinodular goiter :  -Patient has noted local neck enlargement, but she has no dysphagia or pain at this time -Given that these nodules have become toxic, and the fact that she has not been compliant with methimazole intake I have recommended proceeding with a right lobectomy which the patient is in agreement of. -In the meantime we will proceed with a repeat ultrasound of the thyroid    3.Type 2 diabetes mellitus, poorly controlled:  -This was not addressed in detail during her visit, but I did notice a serum glucose of 329 mg/dL, I will adjust her medications and will discuss diabetes management in detail on next visit. -Patient will need optimization of her glucose control prior to proceeding with surgery - Increase Januvia to 100 mg daily  - Continue Metformin 500 mg BID - Increase  Glimepiride 4 mg , to 2 tablets daily   Follow-up in 3 months  Addendum: A voicemail left for the patient to contact us back on 12/17/2019 at 1300. A letter will be sent   Signed electronically by: Mack Guise, MD  [facility] Endocrinology  [facility] [address] [place], [place] [address] Phone: [phone] FAX: [phone]   CC: Lanae Boast, [address]  [place] [address] Phone: [phone] Fax: [phone]   Return to Endocrinology clinic as below: Future  Appointments  Date Time Provider [place]  02/07/2020  9:40 AM Vevelyn Francois, NP [facility] None

## 2019-12-15 NOTE — Patient Instructions (Signed)
We recommend that you follow these hyperthyroidism instructions at home:  1) Take Methimazole 10 mg,  Two tablets a day  If you develop severe sore throat with high fevers OR develop unexplained yellowing of your skin, eyes, under your tongue, severe abdominal pain with nausea or vomiting --> then please get evaluated immediately.   3) Get repeat thyroid labs 6 weeks.   It is ESSENTIAL to get follow-up labs to help avoid over or undertreatment of your hyperthyroidism - both of which can be dangerous to your health.

## 2019-12-16 ENCOUNTER — Other Ambulatory Visit: Payer: Self-pay | Admitting: Family Medicine

## 2019-12-16 DIAGNOSIS — I10 Essential (primary) hypertension: Secondary | ICD-10-CM

## 2019-12-16 LAB — CBC WITH DIFFERENTIAL/PLATELET
Basophils Absolute: 0.1 10*3/uL (ref 0.0–0.1)
Basophils Relative: 1 % (ref 0.0–3.0)
Eosinophils Absolute: 0.1 10*3/uL (ref 0.0–0.7)
Eosinophils Relative: 1.2 % (ref 0.0–5.0)
HCT: 39.1 % (ref 36.0–46.0)
Hemoglobin: 12.5 g/dL (ref 12.0–15.0)
Lymphocytes Relative: 30.1 % (ref 12.0–46.0)
Lymphs Abs: 2.4 10*3/uL (ref 0.7–4.0)
MCHC: 32 g/dL (ref 30.0–36.0)
MCV: 72.9 fl — ABNORMAL LOW (ref 78.0–100.0)
Monocytes Absolute: 0.5 10*3/uL (ref 0.1–1.0)
Monocytes Relative: 6.7 % (ref 3.0–12.0)
Neutro Abs: 4.8 10*3/uL (ref 1.4–7.7)
Neutrophils Relative %: 61 % (ref 43.0–77.0)
Platelets: 383 10*3/uL (ref 150.0–400.0)
RBC: 5.36 Mil/uL — ABNORMAL HIGH (ref 3.87–5.11)
RDW: 15.2 % (ref 11.5–15.5)
WBC: 7.9 10*3/uL (ref 4.0–10.5)

## 2019-12-16 LAB — COMPREHENSIVE METABOLIC PANEL
ALT: 30 U/L (ref 0–35)
AST: 16 U/L (ref 0–37)
Albumin: 3.7 g/dL (ref 3.5–5.2)
Alkaline Phosphatase: 101 U/L (ref 39–117)
BUN: 15 mg/dL (ref 6–23)
CO2: 24 mEq/L (ref 19–32)
Calcium: 9.1 mg/dL (ref 8.4–10.5)
Chloride: 101 mEq/L (ref 96–112)
Creatinine, Ser: 0.39 mg/dL — ABNORMAL LOW (ref 0.40–1.20)
GFR: 204.34 mL/min (ref >60.00)
Glucose, Bld: 329 mg/dL — ABNORMAL HIGH (ref 70–99)
Potassium: 3.9 mEq/L (ref 3.5–5.1)
Sodium: 136 mEq/L (ref 135–145)
Total Bilirubin: 0.4 mg/dL (ref 0.2–1.2)
Total Protein: 7.2 g/dL (ref 6.0–8.3)

## 2019-12-16 LAB — T4, FREE: Free T4: 1.5 ng/dL (ref 0.60–1.60)

## 2019-12-16 LAB — TSH: TSH: <0.01 u[IU]/mL — ABNORMAL LOW (ref 0.35–4.50)

## 2019-12-16 MED FILL — GLIMEPIRIDE 4 MG TABS: 4 | 30 days supply | Qty: 30 | Fill #1

## 2019-12-16 MED FILL — methIMAzole 10 MG TABS: 10 | 30 days supply | Qty: 60 | Fill #0

## 2019-12-16 MED FILL — AMLODIPINE BESYLATE 10 MG T: 10 | 30 days supply | Qty: 30 | Fill #0

## 2019-12-16 MED FILL — ATENOLOL 25 MG TABLET: 25 | 30 days supply | Qty: 30 | Fill #0

## 2019-12-16 MED FILL — metFORMIN HCL 500 MG TABS: 500 | 30 days supply | Qty: 60 | Fill #1

## 2019-12-16 MED FILL — JANUVIA 25 MG TABLET: 25 | 30 days supply | Qty: 30 | Fill #1

## 2019-12-17 ENCOUNTER — Encounter: Payer: Self-pay | Admitting: Internal Medicine

## 2019-12-17 ENCOUNTER — Telehealth: Payer: Self-pay | Admitting: Internal Medicine

## 2019-12-17 DIAGNOSIS — E0520 Thyrotoxicosis with toxic multinodular goiter without thyrotoxic crisis or storm: Secondary | ICD-10-CM | POA: Insufficient documentation

## 2019-12-17 DIAGNOSIS — E1165 Type 2 diabetes mellitus with hyperglycemia: Secondary | ICD-10-CM | POA: Insufficient documentation

## 2019-12-17 DIAGNOSIS — E0590 Thyrotoxicosis, unspecified without thyrotoxic crisis or storm: Secondary | ICD-10-CM | POA: Insufficient documentation

## 2019-12-17 MED ORDER — METHIMAZOLE 10 MG PO TABS: 10.0000 mg | ORAL_TABLET | Freq: Every day | ORAL | 6 refills | Status: DC

## 2019-12-17 MED ORDER — SITAGLIPTIN PHOSPHATE 100 MG PO TABS: 100.0000 mg | ORAL_TABLET | Freq: Every day | ORAL | 6 refills | Status: DC

## 2019-12-17 NOTE — Telephone Encounter (Signed)
Left a message for the patient to call back to discuss her lab results     [place], MD  [facility] Endocrinology  [facility] [address] [place], [place] [address] Phone: [phone] FAX: [phone]

## 2019-12-20 ENCOUNTER — Other Ambulatory Visit: Payer: Self-pay | Admitting: Internal Medicine

## 2019-12-20 ENCOUNTER — Ambulatory Visit
Admission: RE | Admit: 2019-12-20 | Discharge: 2019-12-20 | Disposition: A | Discharge status: Home / Self Care | Payer: 59 | Source: Ambulatory Visit | Attending: Internal Medicine | Admitting: Internal Medicine

## 2019-12-20 DIAGNOSIS — E042 Nontoxic multinodular goiter: Secondary | ICD-10-CM

## 2019-12-20 DIAGNOSIS — E119 Type 2 diabetes mellitus without complications: Secondary | ICD-10-CM

## 2019-12-20 MED ORDER — GLIMEPIRIDE 4 MG PO TABS: 8.0000 mg | ORAL_TABLET | Freq: Every day | ORAL | 3 refills | Status: DC

## 2019-12-20 NOTE — Telephone Encounter (Signed)
Discussed thyroid ultrasound with results as below, thyroid nodules on the right lobe are enlarging, patient continues to be in agreement with proceeding with surgical evaluation for right lobectomy.  Thyroid ultrasound 12/20/2019 Heterogeneous nodule with cystic components in the superior right thyroid lobe measures 4.9 x 2.8 x 4.6 cm and previously measured 4.1 x 2.4 x 3.5 cm.  Solid and minimally heterogeneous nodule in the medial mid right thyroid lobe measures 3.6 x 1.6 x 3.1 cm and previously measured 3.1 x 1.5 x 2.6 cm.  Left thyroid tissue is surgically absent. No evidence for recurrent or residual thyroid tissue in left thyroidectomy bed.  IMPRESSION: Previously biopsied right thyroid nodules have enlarged in size. No new thyroid nodules.    I did also discuss her previous lab results, and we reviewed the recommendations that was sent in the letter with reducing methimazole 10 mg, to 1 tablet daily, continuing atenolol. Increasing glimepiride to 2 tablets with breakfast, increasing Januvia to 100 mg daily   Patient expressed understanding  Abby Nena Jordan, MD  [facility] Endocrinology  [facility] [address] [place], [place] [address] Phone: [phone] FAX: [phone]

## 2019-12-21 LAB — TRAB (TSH RECEPTOR BINDING ANTIBODY): TRAB: 1.14 IU/L (ref <=2.00)

## 2019-12-21 LAB — T3: T3, Total: 133 ng/dL (ref 76–181)

## 2019-12-29 ENCOUNTER — Ambulatory Visit
Admission: RE | Admit: 2019-12-29 | Discharge: 2019-12-29 | Disposition: A | Discharge status: Home / Self Care | Payer: 59 | Source: Ambulatory Visit | Attending: Family Medicine | Admitting: Family Medicine

## 2019-12-29 ENCOUNTER — Other Ambulatory Visit: Payer: Self-pay | Admitting: Family Medicine

## 2019-12-29 ENCOUNTER — Other Ambulatory Visit: Payer: Self-pay

## 2019-12-29 DIAGNOSIS — Z1231 Encounter for screening mammogram for malignant neoplasm of breast: Secondary | ICD-10-CM

## 2019-12-29 MED FILL — AMLODIPINE BESYLATE 10 MG T: 10 | 30 days supply | Qty: 30 | Fill #0

## 2019-12-29 MED FILL — JANUVIA 100 MG TABLET: 100 | 30 days supply | Qty: 30 | Fill #0

## 2019-12-29 MED FILL — GLIMEPIRIDE 4 MG TABS: 4 | 30 days supply | Qty: 60 | Fill #0

## 2020-01-27 ENCOUNTER — Other Ambulatory Visit: Payer: Self-pay | Admitting: Internal Medicine

## 2020-01-27 DIAGNOSIS — E042 Nontoxic multinodular goiter: Secondary | ICD-10-CM

## 2020-01-31 ENCOUNTER — Other Ambulatory Visit: Payer: Self-pay

## 2020-01-31 ENCOUNTER — Other Ambulatory Visit (INDEPENDENT_AMBULATORY_CARE_PROVIDER_SITE_OTHER): Payer: 59

## 2020-01-31 DIAGNOSIS — E042 Nontoxic multinodular goiter: Secondary | ICD-10-CM | POA: Diagnosis not present

## 2020-01-31 LAB — T4, FREE: Free T4: 1.22 ng/dL (ref 0.60–1.60)

## 2020-01-31 LAB — TSH: TSH: <0.01 u[IU]/mL — ABNORMAL LOW (ref 0.35–4.50)

## 2020-02-01 ENCOUNTER — Telehealth: Payer: Self-pay | Admitting: Internal Medicine

## 2020-02-01 NOTE — Telephone Encounter (Signed)
Patient called stating she missed a call from [place] but I do not see anything documented stating that we called her. Please advise. [phone]

## 2020-02-01 NOTE — Telephone Encounter (Signed)
Please advise 

## 2020-02-01 NOTE — Telephone Encounter (Signed)
Pt stated ok to her results and instructions to continue methimazole and when I asked if she had heard from the surgeons office she just said ok so I am not completely sure she understood me.

## 2020-02-07 ENCOUNTER — Telehealth: Payer: Self-pay | Admitting: Internal Medicine

## 2020-02-07 ENCOUNTER — Ambulatory Visit: Payer: Self-pay | Admitting: Nurse Practitioner

## 2020-02-07 NOTE — Telephone Encounter (Signed)
Patient called stating the new dosage for her Crystal [name] is causing stomach problems and requests a new RX to be sent to   [facility], [place]. [place] Phone:  [phone]  Fax:  [phone]     Of 25mg  but instructions to be to take 2 in the morning and 2 at night.Marland Kitchenph# [phone]

## 2020-02-08 MED ORDER — SITAGLIPTIN PHOSPHATE 25 MG PO TABS: 25.0000 mg | ORAL_TABLET | Freq: Two times a day (BID) | ORAL | 6 refills | Status: DC

## 2020-02-08 NOTE — Telephone Encounter (Signed)
Pt wants to take 25 mg tab 4 times a day or 25 mg tab 2 times a day please advise

## 2020-02-08 NOTE — Addendum Note (Signed)
Addended by: Dorita Sciara on: 02/08/2020 11:32 AM   Modules accepted: Orders

## 2020-02-08 NOTE — Telephone Encounter (Signed)
Please advise 

## 2020-02-08 NOTE — Telephone Encounter (Signed)
Lft vm to return call 

## 2020-02-08 NOTE — Telephone Encounter (Signed)
Lft vm to return call to inform pt of change

## 2020-02-08 NOTE — Telephone Encounter (Signed)
Now the patient's pharmacy is calling saying they need the RX to be "1 time a day" for insurance purposes. Please advise. Pharmacy [phone]

## 2020-02-10 MED FILL — PANTOPRAZOLE SOD DR 40 MG T: 40 | 30 days supply | Qty: 30 | Fill #1

## 2020-02-10 MED FILL — JANUVIA 100 MG TABLET: 100 | 30 days supply | Qty: 30 | Fill #1

## 2020-02-10 MED FILL — AMLODIPINE BESYLATE 10 MG T: 10 | 30 days supply | Qty: 30 | Fill #1

## 2020-02-10 MED FILL — METFORMIN HCL 500 MG TABS: 500 | 30 days supply | Qty: 60 | Fill #2

## 2020-02-10 MED FILL — methIMAzole 10 MG TABS: 10 | 30 days supply | Qty: 30 | Fill #0

## 2020-02-10 MED FILL — GLIMEPIRIDE 4 MG TABS: 4 | 30 days supply | Qty: 60 | Fill #1

## 2020-02-10 NOTE — Telephone Encounter (Signed)
Attempted to reach pt again, will close out this phone note.

## 2020-02-11 ENCOUNTER — Ambulatory Visit (INDEPENDENT_AMBULATORY_CARE_PROVIDER_SITE_OTHER): Payer: 59 | Admitting: Nurse Practitioner

## 2020-02-11 ENCOUNTER — Telehealth: Payer: Self-pay

## 2020-02-11 ENCOUNTER — Other Ambulatory Visit: Payer: Self-pay

## 2020-02-11 ENCOUNTER — Other Ambulatory Visit: Payer: Self-pay | Admitting: Nurse Practitioner

## 2020-02-11 VITALS — BP 132/54 | HR 89 | Ht 66.0 in | Wt 163.0 lb

## 2020-02-11 DIAGNOSIS — E119 Type 2 diabetes mellitus without complications: Secondary | ICD-10-CM | POA: Diagnosis not present

## 2020-02-11 DIAGNOSIS — Z0000 Encounter for general adult medical examination without abnormal findings: Secondary | ICD-10-CM

## 2020-02-11 DIAGNOSIS — J301 Allergic rhinitis due to pollen: Secondary | ICD-10-CM

## 2020-02-11 DIAGNOSIS — L210 Seborrhea capitis: Secondary | ICD-10-CM | POA: Diagnosis not present

## 2020-02-11 DIAGNOSIS — E785 Hyperlipidemia, unspecified: Secondary | ICD-10-CM

## 2020-02-11 DIAGNOSIS — E0590 Thyrotoxicosis, unspecified without thyrotoxic crisis or storm: Secondary | ICD-10-CM

## 2020-02-11 LAB — POCT GLYCOSYLATED HEMOGLOBIN (HGB A1C)
HbA1c POC (<> result, manual entry): 10.7 % (ref 4.0–5.6)
HbA1c, POC (controlled diabetic range): 10.7 % — AB (ref 0.0–7.0)
HbA1c, POC (prediabetic range): 10.7 % — AB (ref 5.7–6.4)
Hemoglobin A1C: 10.7 % — AB (ref 4.0–5.6)

## 2020-02-11 LAB — POCT URINALYSIS DIPSTICK
Bilirubin, UA: NEGATIVE
Blood, UA: NEGATIVE
Glucose, UA: POSITIVE — AB
Ketones, UA: NEGATIVE
Leukocytes, UA: NEGATIVE
Nitrite, UA: NEGATIVE
Protein, UA: NEGATIVE
Spec Grav, UA: 1.02 (ref 1.010–1.025)
Urobilinogen, UA: 0.2 E.U./dL
pH, UA: 5.5 (ref 5.0–8.0)

## 2020-02-11 LAB — POCT GLUCOSE (DEVICE FOR HOME USE)
Glucose Fasting, POC: 246 mg/dL — AB (ref 70–99)
POC Glucose: 246 mg/dl — AB (ref 70–99)

## 2020-02-11 MED ORDER — TRUE METRIX BLOOD GLUCOSE TEST VI STRP: ORAL_STRIP | 12 refills | Status: DC

## 2020-02-11 MED ORDER — SITAGLIPTIN PHOSPHATE 50 MG PO TABS: 50.0000 mg | ORAL_TABLET | Freq: Two times a day (BID) | ORAL | 2 refills | Status: DC

## 2020-02-11 MED ORDER — SM DANDRUFF SHAMPOO 1 % EX SHAM: MEDICATED_SHAMPOO | Freq: Every day | CUTANEOUS | 12 refills | Status: DC | PRN

## 2020-02-11 MED ORDER — ONETOUCH ULTRASOFT LANCETS MISC: 12 refills | Status: AC

## 2020-02-11 MED ORDER — SITAGLIPTIN PHOSPHATE 25 MG PO TABS: 25.0000 mg | ORAL_TABLET | Freq: Four times a day (QID) | ORAL | 2 refills | Status: DC

## 2020-02-11 MED ORDER — BLOOD GLUCOSE METER KIT: PACK | 0 refills | Status: AC

## 2020-02-11 MED ORDER — TRUE METRIX METER W/DEVICE KIT: 1.0000 | PACK | Freq: Three times a day (TID) | 1 refills | Status: DC

## 2020-02-11 MED ORDER — FLUTICASONE PROPIONATE 50 MCG/ACT NA SUSP: 2.0000 | Freq: Every day | NASAL | 6 refills | Status: DC

## 2020-02-11 MED ORDER — VITAMIN D HIGH POTENCY 25 MCG (1000 UT) PO CAPS: 1000.0000 [IU] | ORAL_CAPSULE | Freq: Every day | ORAL | 3 refills | Status: DC

## 2020-02-11 MED ORDER — TRUEPLUS LANCETS 28G MISC: 5 refills | Status: DC

## 2020-02-11 MED ORDER — GLUCOSE BLOOD VI STRP: ORAL_STRIP | 12 refills | Status: DC

## 2020-02-11 MED FILL — ONE TOUCH VERIO TEST STRIP: 30 days supply | Qty: 100 | Fill #0

## 2020-02-11 MED FILL — FLUTICASONE PROP 50 MCG SPR: 50 | 30 days supply | Qty: 16 | Fill #0

## 2020-02-11 MED FILL — ONETOUCH VERIO REFLECT W/DE: W/DEVICE | 1 days supply | Qty: 1 | Fill #0

## 2020-02-11 MED FILL — ONETOUCH DELICA PLUS LANCET: 25 days supply | Qty: 100 | Fill #0

## 2020-02-11 NOTE — Progress Notes (Signed)
Established Patient Office Visit  Subjective:  Patient ID: Crystal [name], female    DOB: [birth year]  Age: 59 y.o. MRN: [id number]  CC:  Chief Complaint  Patient presents with  . Follow-up    3 month follow up for htn , having issues with allergey icthy eyes , runny nose     HPI Crystal [name] presents for follow up. She  has a past medical history of Diabetes mellitus, Hypertension, and Hyperthyroidism.   Diabetes Mellitus Patient presents for follow up of diabetes. Current symptoms include: hyperglycemia, paresthesia of the feet and visual disturbances. Symptoms have progressed to a point and plateaued. Patient denies foot ulcerations, hypoglycemia , polydipsia, polyuria, vomiting and weight loss. Evaluation to date has included: fasting blood sugar, fasting lipid panel, hemoglobin A1C and microalbuminuria.  Home sugars: patient does not check sugars. However she knows that they are high. Current treatment: Continued sulfonylurea which has been not very effective and Continued metformin which has been not very effective. Crystal [name] is not very compliant with her medication. She admits that is makes her have abdominal discomfort. She is not able to take  Januvia 100 mg qd. She wants to have this split up in increments so that she can tolerate it better. She confesses that she still needs to work on diet and exercise. Last dilated eye exam: 2020.  She has been having an itchy scalp. She admits that this is new and is getting worse. She has tried some OTC shampoos that have not been very effective.   She has noticed that her allergy symptoms are getting worse. She is on Claritin and this is helpful but she needs a nasal spray. Denies headache, dizziness, shortness of breath, dyspnea on exertion, chest pain, nausea, vomiting or any edema.    Past Medical History:  Diagnosis Date  . Diabetes mellitus   . Hypertension   . Hyperthyroidism     Past Surgical History:  Procedure  Laterality Date  . ANTERIOR CERVICAL DECOMP/DISCECTOMY FUSION N/A 10/11/2016   Procedure: ANTERIOR CERVICAL DISCECTOMY FUSION C6-7 WITH EXCISION OF OSTEOPHYTE;  Surgeon: [name], MD;  Location: [place];  Service: Orthopedics;  Laterality: N/A;  . CESAREAN SECTION    . TUBAL LIGATION      No family history on file.  Social History   Socioeconomic History  . Marital status: Married    Spouse name: Not on file  . Number of children: Not on file  . Years of education: Not on file  . Highest education level: Not on file  Occupational History  . Not on file  Tobacco Use  . Smoking status: Never Smoker  . Smokeless tobacco: Never Used  Substance and Sexual Activity  . Alcohol use: No  . Drug use: No  . Sexual activity: Not on file  Other Topics Concern  . Not on file  Social History Narrative  . Not on file   Social Determinants of Health   Financial Resource Strain:   . Difficulty of Paying Living Expenses:   Food Insecurity:   . Worried About Charity fundraiser in the Last Year:   . Arboriculturist in the Last Year:   Transportation Needs:   . Film/video editor (Medical):   [name] Lack of Transportation (Non-Medical):   Physical Activity:   . Days of Exercise per Week:   . Minutes of Exercise per Session:   Stress:   . Feeling of Stress :   Social  Connections:   . Frequency of Communication with Friends and Family:   . Frequency of Social Gatherings with Friends and Family:   . Attends Religious Services:   . Active Member of Clubs or Organizations:   . Attends Archivist Meetings:   [name] Marital Status:   Intimate Partner Violence:   . Fear of Current or Ex-Partner:   . Emotionally Abused:   [name] Physically Abused:   . Sexually Abused:     Outpatient Medications Prior to Visit  Medication Sig Dispense Refill  . acetaminophen (TYLENOL) 500 MG tablet Take 1,000 mg by mouth daily as needed for moderate pain.     [name] amLODipine (NORVASC) 10 MG tablet TAKE 1  TABLET BY MOUTH DAILY. 30 tablet 5  . atenolol (TENORMIN) 25 MG tablet Take 1 tablet (25 mg total) by mouth daily. 30 tablet 0  . methimazole (TAPAZOLE) 10 MG tablet Take 1 tablet (10 mg total) by mouth daily. 30 tablet 6  . pantoprazole (PROTONIX) 40 MG tablet Take 1 tablet (40 mg total) by mouth daily. 30 tablet 3  . simvastatin (ZOCOR) 10 MG tablet Take 1 tablet (10 mg total) by mouth every evening. 30 tablet 11  . Blood Glucose Monitoring Suppl (TRUE METRIX METER) w/Device KIT USE AS DIRECTED IN THE MORNING. 1 kit 1  . glucose blood (TRUE METRIX BLOOD GLUCOSE TEST) test strip Use as instructed 100 each 12  . sitaGLIPtin (JANUVIA) 25 MG tablet Take 1 tablet (25 mg total) by mouth 2 (two) times daily. 60 tablet 6  . TRUEPLUS LANCETS 28G MISC USE AS INSTRUCTED 100 each 5  . glimepiride (AMARYL) 4 MG tablet Take 2 tablets (8 mg total) by mouth daily before breakfast. (Patient not taking: Reported on 02/11/2020) 180 tablet 3  . loratadine (CLARITIN) 10 MG tablet Take 1 tablet (10 mg total) by mouth daily. (Patient taking differently: Take 10 mg by mouth daily as needed for allergies. ) 30 tablet 11  . metFORMIN (GLUCOPHAGE) 500 MG tablet Take 1 tablet (500 mg total) by mouth 2 (two) times daily with a meal. 180 tablet 0   Facility-Administered Medications Prior to Visit  Medication Dose Route Frequency Provider Last Rate Last Admin  . MEDERMA gel   Topical Once [name], MD        Allergies  Allergen Reactions  . Aspirin Nausea And Vomiting  . Tramadol     Upset stomach     ROS Review of Systems    Objective:    Physical Exam  BP (!) 132/54 (BP Location: Right Arm, Patient Position: Sitting)   Pulse 89   Ht '5\' 6"'$  (1.676 m)   Wt 163 lb (73.9 kg)   LMP 06/02/2013   SpO2 100%   BMI 26.31 kg/m  Wt Readings from Last 3 Encounters:  02/11/20 163 lb (73.9 kg)  12/15/19 161 lb 12.8 oz (73.4 kg)  11/08/19 160 lb (72.6 kg)     Health Maintenance Due  Topic Date Due  .  OPHTHALMOLOGY EXAM  Never done  . COVID-19 Vaccine (1) Never done  . PAP SMEAR-Modifier  10/10/2008  . COLONOSCOPY  Never done    There are no preventive care reminders to display for this patient.  Lab Results  Component Value Date   TSH <0.01 (L) 01/31/2020   Lab Results  Component Value Date   WBC 7.9 12/15/2019   HGB 12.5 12/15/2019   HCT 39.1 12/15/2019   MCV 72.9 (L) 12/15/2019  PLT 383.0 12/15/2019   Lab Results  Component Value Date   NA 136 12/15/2019   K 3.9 12/15/2019   CO2 24 12/15/2019   GLUCOSE 329 (H) 12/15/2019   BUN 15 12/15/2019   CREATININE 0.39 (L) 12/15/2019   BILITOT 0.4 12/15/2019   ALKPHOS 101 12/15/2019   AST 16 12/15/2019   ALT 30 12/15/2019   PROT 7.2 12/15/2019   ALBUMIN 3.7 12/15/2019   CALCIUM 9.1 12/15/2019   ANIONGAP 10 11/08/2016   GFR 204.34 12/15/2019   Lab Results  Component Value Date   CHOL 241 (H) 11/08/2019   Lab Results  Component Value Date   HDL 47 11/08/2019   Lab Results  Component Value Date   LDLCALC 167 (H) 11/08/2019   Lab Results  Component Value Date   TRIG 149 11/08/2019   Lab Results  Component Value Date   CHOLHDL 5.1 (H) 11/08/2019   Lab Results  Component Value Date   HGBA1C 10.7 (A) 02/11/2020   HGBA1C 10.7 02/11/2020   HGBA1C 10.7 (A) 02/11/2020   HGBA1C 10.7 (A) 02/11/2020      Assessment & Plan:   Problem List Items Addressed This Visit      High   Diabetes mellitus ([place]) - Primary   Relevant Medications   sitaGLIPtin (JANUVIA) 50 MG tablet   Other Relevant Orders   POCT Urinalysis Dipstick (Completed)   POCT Glucose (Device for Home Use) (Completed)   HgB A1c (Completed)   Comp. Metabolic Panel (12)   For home use only DME Other see comment   POCT Glucose (Device for Home Use)   Dyslipidemia   Relevant Orders   Lipid panel     Unprioritized   Hyperthyroidism    Other Visit Diagnoses    Seasonal allergic rhinitis due to pollen       Dandruff, adult       Healthcare  maintenance       Relevant Orders   Vitamin B12   Magnesium   CBC with Differential/Platelet   Iron, TIBC and Ferritin Panel      Meds ordered this encounter  Medications  . DISCONTD: sitaGLIPtin (JANUVIA) 25 MG tablet    Sig: Take 1 tablet (25 mg total) by mouth 4 (four) times daily.    Dispense:  120 tablet    Refill:  2    Per pt request, would like the 25 mg tabs    Order Specific Question:   Supervising Provider    Answer:   Tresa Garter [[id number]]  . DISCONTD: Blood Glucose Monitoring Suppl (TRUE METRIX METER) w/Device KIT    Sig: Inject 1 kit into the skin 4 (four) times daily -  before meals and at bedtime.    Dispense:  1 kit    Refill:  1    Order Specific Question:   Supervising Provider    Answer:   Tresa Garter [id number]  . DISCONTD: glucose blood (TRUE METRIX BLOOD GLUCOSE TEST) test strip    Sig: Use as instructed    Dispense:  100 each    Refill:  12    Order Specific Question:   Supervising Provider    Answer:   Tresa Garter [[id number]]  . DISCONTD: TRUEplus Lancets 28G MISC    Sig: USE AS INSTRUCTED    Dispense:  100 each    Refill:  5    Order Specific Question:   Supervising Provider    Answer:   Tresa Garter [id number]  .  fluticasone (FLONASE) 50 MCG/ACT nasal spray    Sig: Place 2 sprays into both nostrils daily.    Dispense:  16 g    Refill:  6    Order Specific Question:   Supervising Provider    Answer:   Tresa Garter [id number]  . pyrithione zinc (SM DANDRUFF SHAMPOO) 1 % shampoo    Sig: Apply topically daily as needed for itching.    Dispense:  400 mL    Refill:  12    Order Specific Question:   Supervising Provider    Answer:   Tresa Garter [id number]  . Cholecalciferol (VITAMIN D HIGH POTENCY) 25 MCG (1000 UT) capsule    Sig: Take 1 capsule (1,000 Units total) by mouth daily.    Dispense:  90 capsule    Refill:  3    Order Specific Question:   Supervising Provider    Answer:   Tresa Garter [id number]  . sitaGLIPtin (JANUVIA) 50 MG tablet    Sig: Take 1 tablet (50 mg total) by mouth 2 (two) times daily.    Dispense:  60 tablet    Refill:  2    Order Specific Question:   Supervising Provider    Answer:   Tresa Garter [id number]  . glucose blood test strip    Sig: One touch verio    Dispense:  100 each    Refill:  12    Order Specific Question:   Supervising Provider    Answer:   Tresa Garter [[id number]]  . Lancets (ONETOUCH ULTRASOFT) lancets    Sig: Use as instructed    Dispense:  100 each    Refill:  12    Order Specific Question:   Supervising Provider    Answer:   Tresa Garter [[id number]]  . blood glucose meter kit and supplies    Sig: One Touch Verio. Use up to four times daily as directed. (FOR ICD-10 E10.9, E11.9).    Dispense:  1 each    Refill:  0    Order Specific Question:   Supervising Provider    Answer:   Tresa Garter [[id number]]    Order Specific Question:   Number of strips    Answer:   100    Order Specific Question:   Number of lancets    Answer:   100    Follow-up: Return in about 3 months (around 05/13/2020).    Vevelyn Francois, NP

## 2020-02-11 NOTE — Progress Notes (Signed)
Integrated Behavioral Health Case Management Referral Note  02/11/2020 Name: Clydie Dillen MEQAST MRN: [id number] DOB: [birth year] Daneka Lantigua Burcher is a 59 y.o. year old female who sees Vevelyn Francois, NP for primary care. LCSW was consulted to assess patient's access to community resources and assist the patient with [employer] .  Assessment: Patient experiencing low income and inadequate health insurance and is in need of an eye exam. Screened patient for eligibility for [facility] which can provide a free eye exam to patients who meet criteria. Patient does have health insurance but her plan does not cover eye exams. Patient has low income and meets other eligibility requirements of [facility]. Patient to provide proof of income documents to accompany [facility] application and CSW will submit application.   Review of patient status, including review of consultants reports, relevant laboratory and other test results, and collaboration with appropriate care team members and the patient's provider was performed as part of comprehensive patient evaluation and provision of services.    SDOH (Social Determinants of Health) assessments performed: No    Outpatient Encounter Medications as of 02/11/2020  Medication Sig  . acetaminophen (TYLENOL) 500 MG tablet Take 1,000 mg by mouth daily as needed for moderate pain.   [name] amLODipine (NORVASC) 10 MG tablet TAKE 1 TABLET BY MOUTH DAILY.  [name] atenolol (TENORMIN) 25 MG tablet Take 1 tablet (25 mg total) by mouth daily.  . Blood Glucose Monitoring Suppl (TRUE METRIX METER) w/Device KIT Inject 1 kit into the skin 4 (four) times daily -  before meals and at bedtime.  [name] glucose blood (TRUE METRIX BLOOD GLUCOSE TEST) test strip Use as instructed  . methimazole (TAPAZOLE) 10 MG tablet Take 1 tablet (10 mg total) by mouth daily.  . pantoprazole (PROTONIX) 40 MG tablet Take 1 tablet (40 mg total) by mouth daily.  . simvastatin (ZOCOR) 10 MG tablet Take 1  tablet (10 mg total) by mouth every evening.  . TRUEplus Lancets 28G MISC USE AS INSTRUCTED  . [DISCONTINUED] Blood Glucose Monitoring Suppl (TRUE METRIX METER) w/Device KIT USE AS DIRECTED IN THE MORNING.  . [DISCONTINUED] glucose blood (TRUE METRIX BLOOD GLUCOSE TEST) test strip Use as instructed  . [DISCONTINUED] sitaGLIPtin (JANUVIA) 25 MG tablet Take 1 tablet (25 mg total) by mouth 2 (two) times daily.  . [DISCONTINUED] sitaGLIPtin (JANUVIA) 25 MG tablet Take 1 tablet (25 mg total) by mouth 4 (four) times daily.  . [DISCONTINUED] TRUEPLUS LANCETS 28G MISC USE AS INSTRUCTED  . Cholecalciferol (VITAMIN D HIGH POTENCY) 25 MCG (1000 UT) capsule Take 1 capsule (1,000 Units total) by mouth daily.  . fluticasone (FLONASE) 50 MCG/ACT nasal spray Place 2 sprays into both nostrils daily.  [name] glimepiride (AMARYL) 4 MG tablet Take 2 tablets (8 mg total) by mouth daily before breakfast. (Patient not taking: Reported on 02/11/2020)  . loratadine (CLARITIN) 10 MG tablet Take 1 tablet (10 mg total) by mouth daily. (Patient taking differently: Take 10 mg by mouth daily as needed for allergies. )  . metFORMIN (GLUCOPHAGE) 500 MG tablet Take 1 tablet (500 mg total) by mouth 2 (two) times daily with a meal.  . pyrithione zinc (SM DANDRUFF SHAMPOO) 1 % shampoo Apply topically daily as needed for itching.  . sitaGLIPtin (JANUVIA) 50 MG tablet Take 1 tablet (50 mg total) by mouth 2 (two) times daily.   Facility-Administered Encounter Medications as of 02/11/2020  Medication  . MEDERMA gel    Goals Addressed   None  Follow up Plan: 1. Patient to provide supporting documents for [facility] application 2. CSW will assist in submitting and with follow up.  Estanislado Emms, [facility] [phone]

## 2020-02-11 NOTE — Telephone Encounter (Signed)
Insurance prefers one touch verio meter and supplies, if appropriate, can you resend testing supplies

## 2020-02-11 NOTE — Patient Instructions (Signed)

## 2020-02-11 NOTE — Telephone Encounter (Signed)
Good afternoon  I wrote an order this morning right before lunch to have it faxed over.  I will try to locate it in the computer and resend. Sorry for any inconvenience.

## 2020-02-12 LAB — COMP. METABOLIC PANEL (12)
AST: 14 IU/L (ref 0–40)
Albumin/Globulin Ratio: 1.4 (ref 1.2–2.2)
Albumin: 4 g/dL (ref 3.8–4.9)
Alkaline Phosphatase: 114 IU/L (ref 39–117)
BUN/Creatinine Ratio: 28 — ABNORMAL HIGH (ref 9–23)
BUN: 11 mg/dL (ref 6–24)
Bilirubin Total: 0.5 mg/dL (ref 0.0–1.2)
Calcium: 9.2 mg/dL (ref 8.7–10.2)
Chloride: 102 mmol/L (ref 96–106)
Creatinine, Ser: 0.39 mg/dL — ABNORMAL LOW (ref 0.57–1.00)
GFR calc Af Amer: 134 mL/min/{1.73_m2} (ref >59)
GFR calc non Af Amer: 116 mL/min/{1.73_m2} (ref >59)
Globulin, Total: 2.8 g/dL (ref 1.5–4.5)
Glucose: 240 mg/dL — ABNORMAL HIGH (ref 65–99)
Potassium: 4.4 mmol/L (ref 3.5–5.2)
Sodium: 140 mmol/L (ref 134–144)
Total Protein: 6.8 g/dL (ref 6.0–8.5)

## 2020-02-12 LAB — LIPID PANEL
Chol/HDL Ratio: 5.9 ratio — ABNORMAL HIGH (ref 0.0–4.4)
Cholesterol, Total: 246 mg/dL — ABNORMAL HIGH (ref 100–199)
HDL: 42 mg/dL (ref >39)
LDL Chol Calc (NIH): 182 mg/dL — ABNORMAL HIGH (ref 0–99)
Triglycerides: 120 mg/dL (ref 0–149)
VLDL Cholesterol Cal: 22 mg/dL (ref 5–40)

## 2020-02-12 LAB — IRON,TIBC AND FERRITIN PANEL
Ferritin: 70 ng/mL (ref 15–150)
Iron Saturation: 11 % — ABNORMAL LOW (ref 15–55)
Iron: 38 ug/dL (ref 27–159)
Total Iron Binding Capacity: 361 ug/dL (ref 250–450)
UIBC: 323 ug/dL (ref 131–425)

## 2020-02-12 LAB — CBC WITH DIFFERENTIAL/PLATELET
Basophils Absolute: 0 10*3/uL (ref 0.0–0.2)
Basos: 0 %
EOS (ABSOLUTE): 0.1 10*3/uL (ref 0.0–0.4)
Eos: 1 %
Hematocrit: 42.1 % (ref 34.0–46.6)
Hemoglobin: 13.1 g/dL (ref 11.1–15.9)
Immature Grans (Abs): 0 10*3/uL (ref 0.0–0.1)
Immature Granulocytes: 1 %
Lymphocytes Absolute: 2.1 10*3/uL (ref 0.7–3.1)
Lymphs: 28 %
MCH: 23.5 pg — ABNORMAL LOW (ref 26.6–33.0)
MCHC: 31.1 g/dL — ABNORMAL LOW (ref 31.5–35.7)
MCV: 75 fL — ABNORMAL LOW (ref 79–97)
Monocytes Absolute: 0.5 10*3/uL (ref 0.1–0.9)
Monocytes: 7 %
Neutrophils Absolute: 4.5 10*3/uL (ref 1.4–7.0)
Neutrophils: 63 %
Platelets: 367 10*3/uL (ref 150–450)
RBC: 5.58 x10E6/uL — ABNORMAL HIGH (ref 3.77–5.28)
RDW: 14.4 % (ref 11.7–15.4)
WBC: 7.2 10*3/uL (ref 3.4–10.8)

## 2020-02-12 LAB — VITAMIN B12: Vitamin B-12: 717 pg/mL (ref 232–1245)

## 2020-02-12 LAB — MAGNESIUM: Magnesium: 1.7 mg/dL (ref 1.6–2.3)

## 2020-02-14 NOTE — Progress Notes (Signed)
Labs stable Zocor needs to be increased to Zocor 20mg  /day. Is she taking the current dose of Zocor 10mg /day?

## 2020-02-16 ENCOUNTER — Other Ambulatory Visit: Payer: Self-pay | Admitting: Nurse Practitioner

## 2020-02-16 MED ORDER — SIMVASTATIN 20 MG PO TABS: 20.0000 mg | ORAL_TABLET | Freq: Every evening | ORAL | 11 refills | Status: DC

## 2020-03-10 ENCOUNTER — Other Ambulatory Visit: Payer: Self-pay | Admitting: Nurse Practitioner

## 2020-03-10 DIAGNOSIS — E119 Type 2 diabetes mellitus without complications: Secondary | ICD-10-CM

## 2020-03-10 MED FILL — METFORMIN HCL 500 MG TABS: 500 | 30 days supply | Qty: 60 | Fill #0

## 2020-03-10 MED FILL — GLIMEPIRIDE 4 MG TABLET: 4 | 30 days supply | Qty: 60 | Fill #2

## 2020-03-10 MED FILL — SIMVASTATIN 20 MG TABLET: 20 | 30 days supply | Qty: 30 | Fill #0

## 2020-03-10 MED FILL — JANUVIA 100 MG TABLET: 100 | 30 days supply | Qty: 30 | Fill #2

## 2020-03-10 MED FILL — AMLODIPINE BESYLATE 10 MG T: 10 | 30 days supply | Qty: 30 | Fill #2

## 2020-03-10 MED FILL — PANTOPRAZOLE SOD DR 40 MG T: 40 | 30 days supply | Qty: 30 | Fill #2

## 2020-03-20 ENCOUNTER — Ambulatory Visit: Payer: 59 | Admitting: Internal Medicine

## 2020-03-20 DIAGNOSIS — Z0289 Encounter for other administrative examinations: Secondary | ICD-10-CM

## 2020-03-20 NOTE — Progress Notes (Deleted)
Name: Crystal [name] HUTMLY  Age/ Sex: 59 y.o., female   MRN/ DOB: [id number], 04/27/1961     PCP: Crystal Merino, NP   Reason for Endocrinology Evaluation: MNG   Initial Endocrine Consultative Visit: 12/15/2019    PATIENT IDENTIFIER: [name] is a 59 y.o. female with a past medical history of T2DM, HTN and MNG. The patient has followed with Endocrinology clinic since 12/15/2019 for consultative assistance with management of her diabetes.  DIABETIC HISTORY:  [name] was diagnosed with DM*** ***, and started insulin therapy approximately *** years after diagnosis. ***. Her hemoglobin A1c has ranged from *** in ***, peaking at *** in ***.  THYROID HISTORY: Pt has been diagnosed with MNG years ago, she is S/P left lobectomy in 1992 with benign pathology.These records are not available as the surgery was performed in [place].  In 2006 she was noted to have an enlarged right lobe during physical examination, which prompted a thyroid ultrasound revealing multiple thyroid nodules.  By 2014 she had developed new right thyroid lobe nodules, and was noted to have 2 small nodules at the left thyroid bed but were too small to biopsy.    In 01/2013 she had an FNA of the right superior nodule as well as of the right middle nodule with benign cytology, she was also noted to develop hyperthyroidism in 2014. She was prescribed methimazole but admits to non compliance.  An ultrasound in 12/2019 showed increased right thyroid nodule in size and surgical intervention was performed.   Strong FH of thyroid disease  SUBJECTIVE:   During the last visit (12/15/2019): Methimazole was restarted  Today (03/20/2020): [name] is here for a f/u on DM and MNG.  She checks her blood sugars *** times daily, preprandial to breakfast and ***. The patient has *** had hypoglycemic episodes since the last clinic visit, which typically occur *** x / - most often occuring ***.     HOME  REGIMEN:    Januvia  100 mg daily  Metformin 500 mg BID Glimepiride 4 mg , 2 tablets daily  Methimazole 10 mg daily      METER DOWNLOAD SUMMARY: Date range evaluated: *** Fingerstick Blood Glucose Tests = *** Average Number Tests/Day = *** Overall Mean FS Glucose = *** Standard Deviation = ***  BG Ranges: Low = *** High = ***   Hypoglycemic Events/30 Days: BG < 50 = *** Episodes of symptomatic severe hypoglycemia = ***    DIABETIC COMPLICATIONS: Microvascular complications:   ***  Denies:   Last Eye Exam: Completed   Macrovascular complications:   ***  Denies: CAD, CVA, PVD   HISTORY:  Past Medical History:  Past Medical History:  Diagnosis Date  . Diabetes mellitus   . Hypertension   . Hyperthyroidism     Past Surgical History:  Past Surgical History:  Procedure Laterality Date  . ANTERIOR CERVICAL DECOMP/DISCECTOMY FUSION N/A 10/11/2016   Procedure: ANTERIOR CERVICAL DISCECTOMY FUSION C6-7 WITH EXCISION OF OSTEOPHYTE;  Surgeon: Kerrin Champagne, MD;  Location: [facility] OR;  Service: Orthopedics;  Laterality: N/A;  . CESAREAN SECTION    . TUBAL LIGATION       Social History:  reports that she has never smoked. She has never used smokeless tobacco. She reports that she does not drink alcohol and does not use drugs.  Family History: No family history on file.   HOME MEDICATIONS: Allergies as of 03/20/2020      Reactions   Aspirin Nausea And  Vomiting   Tramadol    Upset stomach       Medication List       Accurate as of March 20, 2020 12:48 PM. If you have any questions, ask your nurse or doctor.        acetaminophen 500 MG tablet Commonly known as: TYLENOL Take 1,000 mg by mouth daily as needed for moderate pain.   amLODipine 10 MG tablet Commonly known as: NORVASC TAKE 1 TABLET BY MOUTH DAILY.   atenolol 25 MG tablet Commonly known as: TENORMIN Take 1 tablet (25 mg total) by mouth daily.   blood glucose meter kit and supplies One Touch Verio. Use  up to four times daily as directed. (FOR ICD-10 E10.9, E11.9).   fluticasone 50 MCG/ACT nasal spray Commonly known as: FLONASE Place 2 sprays into both nostrils daily.   glimepiride 4 MG tablet Commonly known as: AMARYL Take 2 tablets (8 mg total) by mouth daily before breakfast.   glucose blood test strip One touch verio   loratadine 10 MG tablet Commonly known as: CLARITIN Take 1 tablet (10 mg total) by mouth daily. What changed:   when to take this  reasons to take this   metFORMIN 500 MG tablet Commonly known as: GLUCOPHAGE TAKE 1 TABLET (500 MG TOTAL) BY MOUTH 2 (TWO) TIMES DAILY WITH A MEAL.   methimazole 10 MG tablet Commonly known as: TAPAZOLE Take 1 tablet (10 mg total) by mouth daily.   onetouch ultrasoft lancets Use as instructed   pantoprazole 40 MG tablet Commonly known as: PROTONIX Take 1 tablet (40 mg total) by mouth daily.   simvastatin 20 MG tablet Commonly known as: Zocor Take 1 tablet (20 mg total) by mouth every evening.   sitaGLIPtin 50 MG tablet Commonly known as: JANUVIA Take 1 tablet (50 mg total) by mouth 2 (two) times daily.   SM Dandruff Shampoo 1 % shampoo Generic drug: pyrithione zinc Apply topically daily as needed for itching.   Vitamin D High Potency 25 MCG (1000 UT) capsule Generic drug: Cholecalciferol Take 1 capsule (1,000 Units total) by mouth daily.        OBJECTIVE:   Vital Signs: LMP 06/02/2013   Wt Readings from Last 3 Encounters:  02/11/20 163 lb (73.9 kg)  12/15/19 161 lb 12.8 oz (73.4 kg)  11/08/19 160 lb (72.6 kg)     Exam: General: Pt appears well and is in NAD  Hydration: Well-hydrated with moist mucous membranes and good skin turgor  HEENT: Head: Unremarkable with good dentition. Oropharynx clear without exudate.  Eyes: External eye exam normal without stare, lid lag or exophthalmos.  EOM intact.  PERRL.  Neck: General: Supple without adenopathy. Thyroid: Thyroid size normal.  No goiter or nodules  appreciated. No thyroid bruit.  Lungs: Clear with good BS bilat with no rales, rhonchi, or wheezes  Heart: RRR with normal S1 and S2 and no gallops; no murmurs; no rub  Abdomen: Normoactive bowel sounds, soft, nontender, without masses or organomegaly palpable  Extremities: No pretibial edema. No tremor. Normal strength and motion throughout. See detailed diabetic foot exam below.  Skin: Normal texture and temperature to palpation. No rash noted. No Acanthosis nigricans/skin tags. No lipohypertrophy.  Neuro: MS is good with appropriate affect, pt is alert and Ox3    DM foot exam: Please see diabetic assessment flow-sheet detailed below:           DATA REVIEWED:  Lab Results  Component Value Date   HGBA1C 10.7 (A)  02/11/2020   HGBA1C 10.7 02/11/2020   HGBA1C 10.7 (A) 02/11/2020   HGBA1C 10.7 (A) 02/11/2020   Lab Results  Component Value Date   MICROALBUR 0.3 09/09/2016   LDLCALC 182 (H) 02/11/2020   CREATININE 0.39 (L) 02/11/2020   Lab Results  Component Value Date   MICRALBCREAT 9 09/09/2016     Lab Results  Component Value Date   CHOL 246 (H) 02/11/2020   HDL 42 02/11/2020   LDLCALC 182 (H) 02/11/2020   TRIG 120 02/11/2020   CHOLHDL 5.9 (H) 02/11/2020         ASSESSMENT / PLAN / RECOMMENDATIONS:   1) Type {NUMBERS 1 OR 2:522190} Diabetes Mellitus, ***controlled, With *** complications - Most recent A1c of *** %. Goal A1c < *** %.  ***  Plan: MEDICATIONS:  ***  EDUCATION / INSTRUCTIONS:  BG monitoring instructions: Patient is instructed to check her blood sugars *** times a day, ***.  Call [facility] if: BG persistently < 70 or > 300. . I reviewed the Rule of 15 for the treatment of hypoglycemia in detail with the patient. Literature supplied.  REFERRALS:  ***.   2) Diabetic complications:   Eye: Does *** have known diabetic retinopathy.   Neuro/ Feet: Does *** have known diabetic peripheral neuropathy .   Renal: Patient does  *** have known baseline CKD. She   is *** on an ACEI/ARB at present. Check urine albumin/creatinine ratio yearly starting at time of diagnosis. If albuminuria is positive, treatment is geared toward better glucose, blood pressure control and use of ACE inhibitors or ARBs. Monitor electrolytes and creatinine once to twice yearly.   3) Lipids: Patient is *** on a statin.  4) Hypertension: *** at goal of < 140/90 mmHg.    F/U in ***    Signed electronically by: Mack Guise, MD  [facility]  [facility] [address] [place], [place] [address] Phone: [phone] FAX: [phone]   CC: Vevelyn Francois, NP [address] [place] [address] Phone: [phone]  Fax: [phone]  Return to Endocrinology clinic as below: Future Appointments  Date Time Provider [place]  03/20/2020  2:00 PM Sencere Symonette, Melanie Crazier, MD [facility] None  05/12/2020 10:20 AM Vevelyn Francois, NP [facility] None

## 2020-04-14 MED FILL — PANTOPRAZOLE SOD DR 40 MG T: 40 | 30 days supply | Qty: 30 | Fill #3

## 2020-04-14 MED FILL — SIMVASTATIN 20 MG TABLET: 20 | 30 days supply | Qty: 30 | Fill #1

## 2020-04-14 MED FILL — METFORMIN HCL 500 MG TABS: 500 | 30 days supply | Qty: 60 | Fill #1

## 2020-04-14 MED FILL — JANUVIA 100 MG TABLET: 100 | 30 days supply | Qty: 30 | Fill #3

## 2020-04-14 MED FILL — AMLODIPINE BESYLATE 10 MG T: 10 | 30 days supply | Qty: 30 | Fill #3

## 2020-04-17 MED FILL — methIMAzole 10 MG TABS: 10 | 30 days supply | Qty: 30 | Fill #1

## 2020-04-17 MED FILL — GLIMEPIRIDE 4 MG TABS: 4 | 30 days supply | Qty: 60 | Fill #2

## 2020-05-12 ENCOUNTER — Other Ambulatory Visit: Payer: Self-pay

## 2020-05-12 ENCOUNTER — Ambulatory Visit (INDEPENDENT_AMBULATORY_CARE_PROVIDER_SITE_OTHER): Payer: 59 | Admitting: Nurse Practitioner

## 2020-05-12 ENCOUNTER — Encounter: Payer: Self-pay | Admitting: Gastroenterology

## 2020-05-12 ENCOUNTER — Other Ambulatory Visit: Payer: Self-pay | Admitting: Nurse Practitioner

## 2020-05-12 VITALS — BP 145/76 | HR 92 | Temp 98.6°F | Resp 14 | Ht 66.0 in | Wt 155.0 lb

## 2020-05-12 DIAGNOSIS — R634 Abnormal weight loss: Secondary | ICD-10-CM

## 2020-05-12 DIAGNOSIS — E119 Type 2 diabetes mellitus without complications: Secondary | ICD-10-CM | POA: Diagnosis not present

## 2020-05-12 DIAGNOSIS — Z1211 Encounter for screening for malignant neoplasm of colon: Secondary | ICD-10-CM

## 2020-05-12 DIAGNOSIS — E0590 Thyrotoxicosis, unspecified without thyrotoxic crisis or storm: Secondary | ICD-10-CM

## 2020-05-12 DIAGNOSIS — I10 Essential (primary) hypertension: Secondary | ICD-10-CM | POA: Diagnosis not present

## 2020-05-12 LAB — POCT URINALYSIS DIPSTICK
Bilirubin, UA: NEGATIVE
Blood, UA: NEGATIVE
Glucose, UA: POSITIVE — AB
Ketones, UA: NEGATIVE
Leukocytes, UA: NEGATIVE
Nitrite, UA: NEGATIVE
Protein, UA: NEGATIVE
Spec Grav, UA: 1.01 (ref 1.010–1.025)
Urobilinogen, UA: 0.2 E.U./dL
pH, UA: 5 (ref 5.0–8.0)

## 2020-05-12 LAB — POCT GLYCOSYLATED HEMOGLOBIN (HGB A1C): Hemoglobin A1C: 11.6 % — AB (ref 4.0–5.6)

## 2020-05-12 MED ORDER — SITAGLIPTIN PHOSPHATE 25 MG PO TABS: 25.0000 mg | ORAL_TABLET | Freq: Four times a day (QID) | ORAL | 11 refills | Status: DC

## 2020-05-12 MED ORDER — METFORMIN HCL 500 MG PO TABS: 1000.0000 mg | ORAL_TABLET | Freq: Two times a day (BID) | ORAL | 11 refills | Status: DC

## 2020-05-12 MED ORDER — SITAGLIPTIN PHOSPHATE 50 MG PO TABS: 50.0000 mg | ORAL_TABLET | Freq: Two times a day (BID) | ORAL | 2 refills | Status: DC

## 2020-05-12 MED FILL — SIMVASTATIN 20 MG TABLET: 20 | 30 days supply | Qty: 30 | Fill #2

## 2020-05-12 MED FILL — methIMAzole 10 MG TABS: 10 | 30 days supply | Qty: 30 | Fill #2

## 2020-05-12 MED FILL — AMLODIPINE BESYLATE 10 MG T: 10 | 30 days supply | Qty: 30 | Fill #4

## 2020-05-12 MED FILL — METFORMIN HCL 500 MG TABS: 500 | 30 days supply | Qty: 120 | Fill #0

## 2020-05-12 MED FILL — PANTOPRAZOLE SOD DR 40 MG T: 40 | 30 days supply | Qty: 30 | Fill #0

## 2020-05-12 MED FILL — GLIMEPIRIDE 4 MG TABS: 4 | 30 days supply | Qty: 60 | Fill #3

## 2020-05-12 MED FILL — JANUVIA 100 MG TABLET: 100 | 30 days supply | Qty: 30 | Fill #4

## 2020-05-12 NOTE — Patient Instructions (Signed)
Diabetes Mellitus and Exercise Exercising regularly is important for your overall health, especially when you have diabetes (diabetes mellitus). Exercising is not only about losing weight. It has many other health benefits, such as increasing muscle strength and bone density and reducing body fat and stress. This leads to improved fitness, flexibility, and endurance, all of which result in better overall health. Exercise has additional benefits for people with diabetes, including:  Reducing appetite.  Helping to lower and control blood glucose.  Lowering blood pressure.  Helping to control amounts of fatty substances (lipids) in the blood, such as cholesterol and triglycerides.  Helping the body to respond better to insulin (improving insulin sensitivity).  Reducing how much insulin the body needs.  Decreasing the risk for heart disease by: ? Lowering cholesterol and triglyceride levels. ? Increasing the levels of good cholesterol. ? Lowering blood glucose levels. What is my activity plan? Your health care provider or certified diabetes educator can help you make a plan for the type and frequency of exercise (activity plan) that works for you. Make sure that you:  Do at least 150 minutes of moderate-intensity or vigorous-intensity exercise each week. This could be brisk walking, biking, or water aerobics. ? Do stretching and strength exercises, such as yoga or weightlifting, at least 2 times a week. ? Spread out your activity over at least 3 days of the week.  Get some form of physical activity every day. ? Do not go more than 2 days in a row without some kind of physical activity. ? Avoid being inactive for more than 30 minutes at a time. Take frequent breaks to walk or stretch.  Choose a type of exercise or activity that you enjoy, and set realistic goals.  Start slowly, and gradually increase the intensity of your exercise over time. What do I need to know about managing my  diabetes?   Check your blood glucose before and after exercising. ? If your blood glucose is 240 mg/dL (13.3 mmol/L) or higher before you exercise, check your urine for ketones. If you have ketones in your urine, do not exercise until your blood glucose returns to normal. ? If your blood glucose is 100 mg/dL (5.6 mmol/L) or lower, eat a snack containing 15-20 grams of carbohydrate. Check your blood glucose 15 minutes after the snack to make sure that your level is above 100 mg/dL (5.6 mmol/L) before you start your exercise.  Know the symptoms of low blood glucose (hypoglycemia) and how to treat it. Your risk for hypoglycemia increases during and after exercise. Common symptoms of hypoglycemia can include: ? Hunger. ? Anxiety. ? Sweating and feeling clammy. ? Confusion. ? Dizziness or feeling light-headed. ? Increased heart rate or palpitations. ? Blurry vision. ? Tingling or numbness around the mouth, lips, or tongue. ? Tremors or shakes. ? Irritability.  Keep a rapid-acting carbohydrate snack available before, during, and after exercise to help prevent or treat hypoglycemia.  Avoid injecting insulin into areas of the body that are going to be exercised. For example, avoid injecting insulin into: ? The arms, when playing tennis. ? The legs, when jogging.  Keep records of your exercise habits. Doing this can help you and your health care provider adjust your diabetes management plan as needed. Write down: ? Food that you eat before and after you exercise. ? Blood glucose levels before and after you exercise. ? The type and amount of exercise you have done. ? When your insulin is expected to peak, if you use   insulin. Avoid exercising at times when your insulin is peaking.  When you start a new exercise or activity, work with your health care provider to make sure the activity is safe for you, and to adjust your insulin, medicines, or food intake as needed.  Drink plenty of water while  you exercise to prevent dehydration or heat stroke. Drink enough fluid to keep your urine clear or pale yellow. Summary  Exercising regularly is important for your overall health, especially when you have diabetes (diabetes mellitus).  Exercising has many health benefits, such as increasing muscle strength and bone density and reducing body fat and stress.  Your health care provider or certified diabetes educator can help you make a plan for the type and frequency of exercise (activity plan) that works for you.  When you start a new exercise or activity, work with your health care provider to make sure the activity is safe for you, and to adjust your insulin, medicines, or food intake as needed. This information is not intended to replace advice given to you by your health care provider. Make sure you discuss any questions you have with your health care provider. Document Revised: 04/17/2017 Document Reviewed: 03/04/2016 Elsevier Patient Education  2020 [employer]  

## 2020-05-12 NOTE — Progress Notes (Signed)
[address] [place], [place]  [address] Phone:  [phone]   Fax:  [phone]   Established Patient Office Visit  Subjective:  Patient ID: Crystal [name], female    DOB: 11-08-1960  Age: 59 y.o. MRN: [id number]  CC:  Chief Complaint  Patient presents with  . Hypertension  . Diabetes    HPI   Crystal [name] is a 59 y.o. female here for discussion regarding unexplained/excessive weight loss. Onset was a few weeks ago. Patient varies but this is not normal for her and her family and friends have noticed the weight loss.  History of eating disorders: none. Patient is not exercising .She is active. Factors associated with weight loss: none. Patient denies: abdominal pain, binge/ purge behaviors, constipation, depressive symptoms, heat intolerance, intentional calorie restriction and poor appetiteShe does have uncontrolled DM. She refuses to use insulin therapy to help control CBG. She is currently on Metformin, glimepiride and Januvia.  She has abdominal discomfort with the Januvia and is not compliant.  She desires to take Januvia 25 mg 4 times daily versus Januvia 100 mg daily.  She does a hyperthyroidism and is being followed by endocrinology. She is also concern today because her sister was recently diagnosed with heart disease.  She admits that she is worried do what ever she needs to do to see why she is losing weight.  Denies headache, dizziness, visual changes, shortness of breath, dyspnea on exertion, chest pain, nausea, vomiting or any edema.    Past Medical History:  Diagnosis Date  . Diabetes mellitus   . Hypertension   . Hyperthyroidism     Past Surgical History:  Procedure Laterality Date  . ANTERIOR CERVICAL DECOMP/DISCECTOMY FUSION N/A 10/11/2016   Procedure: ANTERIOR CERVICAL DISCECTOMY FUSION C6-7 WITH EXCISION OF OSTEOPHYTE;  Surgeon: [name], MD;  Location: [facility];  Service: Orthopedics;  Laterality: N/A;  .  CESAREAN SECTION    . TUBAL LIGATION      No family history on file.  Social History   Socioeconomic History  . Marital status: Married    Spouse name: Not on file  . Number of children: Not on file  . Years of education: Not on file  . Highest education level: Not on file  Occupational History  . Not on file  Tobacco Use  . Smoking status: Never Smoker  . Smokeless tobacco: Never Used  Substance and Sexual Activity  . Alcohol use: No  . Drug use: No  . Sexual activity: Not on file  Other Topics Concern  . Not on file  Social History Narrative  . Not on file   Social Determinants of Health   Financial Resource Strain:   . Difficulty of Paying Living Expenses:   Food Insecurity:   . Worried About Charity fundraiser in the Last Year:   . Arboriculturist in the Last Year:   Transportation Needs:   . Film/video editor (Medical):   [name] Lack of Transportation (Non-Medical):   Physical Activity:   . Days of Exercise per Week:   . Minutes of Exercise per Session:   Stress:   . Feeling of Stress :   Social Connections:   . Frequency of Communication with Friends and Family:   . Frequency of Social Gatherings with Friends and Family:   . Attends Religious Services:   . Active Member of Clubs or Organizations:   . Attends Archivist  Meetings:   [name] Marital Status:   Intimate Partner Violence:   . Fear of Current or Ex-Partner:   . Emotionally Abused:   [name] Physically Abused:   . Sexually Abused:     Outpatient Medications Prior to Visit  Medication Sig Dispense Refill  . acetaminophen (TYLENOL) 500 MG tablet Take 1,000 mg by mouth daily as needed for moderate pain.     [name] amLODipine (NORVASC) 10 MG tablet TAKE 1 TABLET BY MOUTH DAILY. 30 tablet 5  . atenolol (TENORMIN) 25 MG tablet Take 1 tablet (25 mg total) by mouth daily. 30 tablet 0  . blood glucose meter kit and supplies One Touch Verio. Use up to four times daily as directed. (FOR ICD-10 E10.9,  E11.9). 1 each 0  . Cholecalciferol (VITAMIN D HIGH POTENCY) 25 MCG (1000 UT) capsule Take 1 capsule (1,000 Units total) by mouth daily. 90 capsule 3  . fluticasone (FLONASE) 50 MCG/ACT nasal spray Place 2 sprays into both nostrils daily. 16 g 6  . glucose blood test strip One touch verio 100 each 12  . Lancets (ONETOUCH ULTRASOFT) lancets Use as instructed 100 each 12  . loratadine (CLARITIN) 10 MG tablet Take 1 tablet (10 mg total) by mouth daily. (Patient taking differently: Take 10 mg by mouth daily as needed for allergies. ) 30 tablet 11  . methimazole (TAPAZOLE) 10 MG tablet Take 1 tablet (10 mg total) by mouth daily. 30 tablet 6  . pyrithione zinc (SM DANDRUFF SHAMPOO) 1 % shampoo Apply topically daily as needed for itching. 400 mL 12  . simvastatin (ZOCOR) 20 MG tablet Take 1 tablet (20 mg total) by mouth every evening. 30 tablet 11  . JANUVIA 100 MG tablet Take 100 mg by mouth daily.    . metFORMIN (GLUCOPHAGE) 500 MG tablet TAKE 1 TABLET (500 MG TOTAL) BY MOUTH 2 (TWO) TIMES DAILY WITH A MEAL. 60 tablet 5  . pantoprazole (PROTONIX) 40 MG tablet Take 1 tablet (40 mg total) by mouth daily. 30 tablet 3  . glimepiride (AMARYL) 4 MG tablet Take 2 tablets (8 mg total) by mouth daily before breakfast. (Patient not taking: Reported on 02/11/2020) 180 tablet 3  . sitaGLIPtin (JANUVIA) 50 MG tablet Take 1 tablet (50 mg total) by mouth 2 (two) times daily. 60 tablet 2   Facility-Administered Medications Prior to Visit  Medication Dose Route Frequency Provider Last Rate Last Admin  . MEDERMA gel   Topical Once [name], MD        Allergies  Allergen Reactions  . Aspirin Nausea And Vomiting  . Tramadol     Upset stomach     ROS Review of Systems  All other systems reviewed and are negative.     Objective:    Physical Exam Constitutional:      General: She is not in acute distress.    Appearance: Normal appearance. She is not ill-appearing, toxic-appearing or diaphoretic.   HENT:     Head: Normocephalic and atraumatic.     Nose: Nose normal.     Mouth/Throat:     Mouth: Mucous membranes are moist.  Neck:     Comments: Well-healed surgical scar Cardiovascular:     Rate and Rhythm: Normal rate and regular rhythm.     Pulses: Normal pulses.     Heart sounds: Normal heart sounds.  Pulmonary:     Effort: Pulmonary effort is normal.     Breath sounds: Normal breath sounds.  Abdominal:  General: Bowel sounds are normal.     Palpations: Abdomen is soft.  Musculoskeletal:        General: Normal range of motion.     Cervical back: Normal range of motion.  Skin:    General: Skin is warm and dry.     Capillary Refill: Capillary refill takes less than 2 seconds.  Neurological:     General: No focal deficit present.     Mental Status: She is alert and oriented to person, place, and time.  Psychiatric:        Mood and Affect: Mood normal.        Behavior: Behavior normal.        Thought Content: Thought content normal.        Judgment: Judgment normal.     BP (!) 145/76 (BP Location: Right Arm, Patient Position: Sitting, Cuff Size: Normal)   Pulse 92   Temp 98.6 F (37 C) (Oral)   Resp 14   Ht 5\' 6"  (1.676 m)   Wt 155 lb (70.3 kg)   LMP 06/02/2013   SpO2 100%   BMI 25.02 kg/m  Wt Readings from Last 3 Encounters:  05/12/20 155 lb (70.3 kg)  02/11/20 163 lb (73.9 kg)  12/15/19 161 lb 12.8 oz (73.4 kg)     Health Maintenance Due  Topic Date Due  . OPHTHALMOLOGY EXAM  Never done  . COVID-19 Vaccine (1) Never done  . PAP SMEAR-Modifier  10/10/2008  . COLONOSCOPY  Never done  . INFLUENZA VACCINE  05/07/2020    There are no preventive care reminders to display for this patient.  Lab Results  Component Value Date   TSH <0.005 (L) 05/12/2020   Lab Results  Component Value Date   WBC 7.4 05/12/2020   HGB 13.1 05/12/2020   HCT 42.3 05/12/2020   MCV 75 (L) 05/12/2020   PLT 346 05/12/2020   Lab Results  Component Value Date   NA 133  (L) 05/12/2020   K 4.5 05/12/2020   CO2 24 12/15/2019   GLUCOSE 285 (H) 05/12/2020   BUN 10 05/12/2020   CREATININE 0.42 (L) 05/12/2020   BILITOT 0.5 05/12/2020   ALKPHOS 131 (H) 05/12/2020   AST 19 05/12/2020   ALT 30 12/15/2019   PROT 7.1 05/12/2020   ALBUMIN 4.1 05/12/2020   CALCIUM 9.7 05/12/2020   ANIONGAP 10 11/08/2016   GFR 204.34 12/15/2019   Lab Results  Component Value Date   CHOL 246 (H) 02/11/2020   Lab Results  Component Value Date   HDL 42 02/11/2020   Lab Results  Component Value Date   LDLCALC 182 (H) 02/11/2020   Lab Results  Component Value Date   TRIG 120 02/11/2020   Lab Results  Component Value Date   CHOLHDL 5.9 (H) 02/11/2020   Lab Results  Component Value Date   HGBA1C 11.6 (A) 05/12/2020      Assessment & Plan:   Problem List Items Addressed This Visit      Cardiovascular and Mediastinum   Hypertension Blood pressure stable Encouraged on going compliance with current medication regimen Encouraged home monitoring and recording BP <130/80 Eating a heart-healthy diet with less salt Encouraged regular physical activity        Relevant Orders   Urinalysis Dipstick (Completed)      Endocrine   Hyperthyroidism Encourage patient to follow-up with endocrinology for further evaluation and possible adjustment of current regimen   Relevant Orders   TSH (Completed)   T4,  free (Completed)   T3 (Completed)   Diabetes mellitus ([place]) - Primary Encourage compliance with current treatment regimen no dose adjustment however will try for the Janumet 25 mg 4 times daily versus Janumet 100 mg daily due to intolerance. Encourage regular CBG monitoring Encourage contacting office if excessive hyperglycemia and or hypoglycemia Lifestyle modification with healthy diet (fewer calories, more high fiber foods, whole grains and non-starchy vegetables, lower fat meat and fish, low-fat diary include healthy oils) regular exercise (physical activity)  Opthalmology exam discussed completed early 2021 Nutritional consult recommended Regular dental visits encouraged Home BP monitoring also encouraged goal <130/80     Relevant Medications   metFORMIN (GLUCOPHAGE) 500 MG tablet   Other Relevant Orders   HgB A1c (Completed)   Urinalysis Dipstick (Completed)   Comp. Metabolic Panel (12) (Completed)    Other Visit Diagnoses    Weight loss  Discussed with patient some causes of unintentional weight loss which may be related to her chronic conditions of hypothyroidism and uncontrolled diabetes encourage patient to be compliant with her regimen.  Labs completed today to be forwarded to endocrinology for further evaluation      Relevant Orders   CBC with Differential/Platelet (Completed)   Screening for colon cancer     Screen to evaluate for possible answers to additional weight loss   Relevant Orders   Ambulatory referral to Gastroenterology      Meds ordered this encounter  Medications  . DISCONTD: sitaGLIPtin (JANUVIA) 50 MG tablet    Sig: Take 1 tablet (50 mg total) by mouth 2 (two) times daily.    Dispense:  60 tablet    Refill:  2    Order Specific Question:   Supervising Provider    Answer:   JEFFERY, MICHAEL J [[id number]]  . DISCONTD: sitaGLIPtin (JANUVIA) 25 MG tablet    Sig: Take 1 tablet (25 mg total) by mouth 4 (four) times daily.    Dispense:  120 tablet    Refill:  11    Please fill this way for patient thanks    Order Specific Question:   Supervising Provider    Answer:   JEFFERY, MICHAEL J [[id number]]  . metFORMIN (GLUCOPHAGE) 500 MG tablet    Sig: Take 2 tablets (1,000 mg total) by mouth 2 (two) times daily with a meal.    Dispense:  120 tablet    Refill:  11    Order Specific Question:   Supervising Provider    Answer:   Lisette Abu [[id number]]    Follow-up: Return in about 3 months (around 08/12/2020) for well woman [place], NP

## 2020-05-13 LAB — COMP. METABOLIC PANEL (12)
AST: 19 IU/L (ref 0–40)
Albumin/Globulin Ratio: 1.4 (ref 1.2–2.2)
Albumin: 4.1 g/dL (ref 3.8–4.9)
Alkaline Phosphatase: 131 IU/L — ABNORMAL HIGH (ref 48–121)
BUN/Creatinine Ratio: 24 — ABNORMAL HIGH (ref 9–23)
BUN: 10 mg/dL (ref 6–24)
Bilirubin Total: 0.5 mg/dL (ref 0.0–1.2)
Calcium: 9.7 mg/dL (ref 8.7–10.2)
Chloride: 97 mmol/L (ref 96–106)
Creatinine, Ser: 0.42 mg/dL — ABNORMAL LOW (ref 0.57–1.00)
GFR calc Af Amer: 131 mL/min/{1.73_m2} (ref >59)
GFR calc non Af Amer: 113 mL/min/{1.73_m2} (ref >59)
Globulin, Total: 3 g/dL (ref 1.5–4.5)
Glucose: 285 mg/dL — ABNORMAL HIGH (ref 65–99)
Potassium: 4.5 mmol/L (ref 3.5–5.2)
Sodium: 133 mmol/L — ABNORMAL LOW (ref 134–144)
Total Protein: 7.1 g/dL (ref 6.0–8.5)

## 2020-05-13 LAB — CBC WITH DIFFERENTIAL/PLATELET
Basophils Absolute: 0 10*3/uL (ref 0.0–0.2)
Basos: 1 %
EOS (ABSOLUTE): 0.1 10*3/uL (ref 0.0–0.4)
Eos: 1 %
Hematocrit: 42.3 % (ref 34.0–46.6)
Hemoglobin: 13.1 g/dL (ref 11.1–15.9)
Immature Grans (Abs): 0 10*3/uL (ref 0.0–0.1)
Immature Granulocytes: 0 %
Lymphocytes Absolute: 2.6 10*3/uL (ref 0.7–3.1)
Lymphs: 35 %
MCH: 23.2 pg — ABNORMAL LOW (ref 26.6–33.0)
MCHC: 31 g/dL — ABNORMAL LOW (ref 31.5–35.7)
MCV: 75 fL — ABNORMAL LOW (ref 79–97)
Monocytes Absolute: 0.5 10*3/uL (ref 0.1–0.9)
Monocytes: 6 %
Neutrophils Absolute: 4.2 10*3/uL (ref 1.4–7.0)
Neutrophils: 57 %
Platelets: 346 10*3/uL (ref 150–450)
RBC: 5.64 x10E6/uL — ABNORMAL HIGH (ref 3.77–5.28)
RDW: 14.3 % (ref 11.7–15.4)
WBC: 7.4 10*3/uL (ref 3.4–10.8)

## 2020-05-13 LAB — TSH: TSH: <0.005 u[IU]/mL — ABNORMAL LOW (ref 0.450–4.500)

## 2020-05-13 LAB — T3: T3, Total: 181 ng/dL — ABNORMAL HIGH (ref 71–180)

## 2020-05-13 LAB — T4, FREE: Free T4: 1.78 ng/dL — ABNORMAL HIGH (ref 0.82–1.77)

## 2020-05-18 ENCOUNTER — Other Ambulatory Visit: Payer: Self-pay

## 2020-05-18 ENCOUNTER — Telehealth: Payer: Self-pay

## 2020-05-18 MED ORDER — SITAGLIPTIN PHOSPHATE 25 MG PO TABS: 25.0000 mg | ORAL_TABLET | Freq: Four times a day (QID) | ORAL | 11 refills | Status: DC

## 2020-05-18 NOTE — Telephone Encounter (Signed)
Tried to call no answer. Please see message from [facility] below:  Please forward her labs to endocrinology they are currently treating her for her thyroid disorder. She just requested to have all her labs completed at this time. Overall her labs are stable sodium is a little low at 133 and her alkaline phos is slightly elevated at 131. And CBC is stable. Also let her know that insurance will pay for Januvia 25 mg but this is only for daily dosing. Not Januvia 100 mg using 25 mg tabs. She will have to go back to the Januvia 50 mg twice a day. I do apologize but I have tried several times.

## 2020-05-18 NOTE — Telephone Encounter (Signed)
Patient returned call and I have made aware. Thanks!

## 2020-05-19 ENCOUNTER — Encounter: Payer: Self-pay | Admitting: Nurse Practitioner

## 2020-05-19 ENCOUNTER — Telehealth: Payer: Self-pay | Admitting: Internal Medicine

## 2020-05-19 NOTE — Telephone Encounter (Signed)
Using [employer], patient was called and a detailed voicemail was left with MD message. Interpreter [phone].

## 2020-05-19 NOTE — Telephone Encounter (Signed)
-----   Message from Adelina Mings, LPN sent at 8/87/5797  9:28 AM EDT ----- Hi,  This mutual pt recently had some thyroid labs drawn in our office. We wanted to make you aware of the results as she sees you for this. Thank you! ----- Message ----- From: Vevelyn Francois, NP Sent: 05/17/2020  10:19 PM EDT To: Adelina Mings, LPN  Please forward her labs to endocrinology they are currently treating her for her thyroid disorder.  She just requested to have all her labs completed at this time.  Overall her labs are stable sodium is a little low at 133 and her alkaline phos is slightly elevated at 131.  And CBC is stable.Also let her know that insurance will pay for Januvia 25 mg but this is only for daily dosing.  Not Januvia 100 mg using 25 mg tabs.  She will have to go back to the Januvia 50 mg twice a day.  I do apologize but I have tried several times.

## 2020-05-19 NOTE — Telephone Encounter (Signed)
Crystal [name],  Can you please call this pt and ask her to increase methimazole to one AND half tablet daily ( currently she is on 1 tablet daily)   Her thyroid is overactive.    Please FWD this message to Lattie Haw when done, so follow up on surgery referral I did for her a while back.    Thanks you   Abby Nena Jordan, MD  [facility] Endocrinology  [facility] [address] [place], [place] [address] Phone: [phone] FAX: [phone]

## 2020-05-22 NOTE — Telephone Encounter (Signed)
Lattie Haw,   Can you please follow up on the surgery referral I did for her back in 12/2019 ?   Thanks  Abby Nena Jordan, MD  [facility] Endocrinology  [facility] [address] [place], [place] [address] Phone: [phone] FAX: [phone]

## 2020-05-23 NOTE — Telephone Encounter (Signed)
Dr. Kelton Pillar  I contacted [facility] to find out the status and was told they had made an attempt to schedule patient but they were unsuccessful.  I did leave a VM with their [facility] to find out what is needed to get the patient scheduled.  Thanks, Lattie Haw

## 2020-06-05 ENCOUNTER — Other Ambulatory Visit: Payer: Self-pay

## 2020-06-05 ENCOUNTER — Encounter: Payer: 59 | Admitting: Gastroenterology

## 2020-06-05 ENCOUNTER — Ambulatory Visit (AMBULATORY_SURGERY_CENTER): Payer: Self-pay | Admitting: *Deleted

## 2020-06-05 VITALS — Ht 66.0 in | Wt 156.8 lb

## 2020-06-05 DIAGNOSIS — Z1211 Encounter for screening for malignant neoplasm of colon: Secondary | ICD-10-CM

## 2020-06-05 MED ORDER — SUPREP BOWEL PREP KIT 17.5-3.13-1.6 GM/177ML PO SOLN: 1.0000 | Freq: Once | ORAL | 0 refills | Status: AC

## 2020-06-05 MED FILL — SUPREP BOWEL PREP KIT: 17.5-3.13-1 | 1 days supply | Qty: 354 | Fill #0

## 2020-06-05 NOTE — Progress Notes (Signed)
No egg or soy allergy known to patient  No issues with past sedation with any surgeries or procedures no intubation problems in the past  No FH of Malignant Hyperthermia No diet pills per patient No home 02 use per patient  No blood thinners per patient  Pt denies issues with constipation  No A fib or A flutter  EMMI video to pt or via [place] 19 guidelines implemented in PV today with Pt and RN   Pt is Arabic speaking but also speaks [place]- her PV was scheduled for 1030 am- Pt's letter stated for her to be here at 11 am - when she arrived the interpreter had left as she was to be here at 1030 am- Luckily she speaks English- Pv completed- we discussed prep several times- She verbalized understanding of prep instructions and encouraged to call with questions - she states she reads English  as well    Due to the COVID-19 pandemic we are asking patients to follow these guidelines. Please only bring one care partner. Please be aware that your care partner may wait in the car in the parking lot or if they feel like they will be too hot to wait in the car, they may wait in the lobby on the 4th floor. All care partners are required to wear a mask the entire time (we do not have any that we can provide them), they need to practice social distancing, and we will do a Covid check for all patient's and care partners when you arrive. Also we will check their temperature and your temperature. If the care partner waits in their car they need to stay in the parking lot the entire time and we will call them on their cell phone when the patient is ready for discharge so they can bring the car to the front of the building. Also all patient's will need to wear a mask into building.

## 2020-06-15 MED FILL — methIMAzole 10 MG TABS: 10 | 30 days supply | Qty: 30 | Fill #3

## 2020-06-15 MED FILL — SUPREP BOWEL PREP KIT: 17.5-3.13-1 | 354 days supply | Qty: 354 | Fill #0

## 2020-06-15 MED FILL — GLIMEPIRIDE 4 MG TABS: 4 | 30 days supply | Qty: 60 | Fill #4

## 2020-06-15 MED FILL — AMLODIPINE BESYLATE 10 MG T: 10 | 30 days supply | Qty: 30 | Fill #5

## 2020-06-15 MED FILL — METFORMIN HCL 500 MG TABS: 500 | 30 days supply | Qty: 120 | Fill #1

## 2020-06-15 MED FILL — JANUVIA 100 MG TABLET: 100 | 30 days supply | Qty: 30 | Fill #5

## 2020-06-22 ENCOUNTER — Encounter: Payer: Self-pay | Admitting: Gastroenterology

## 2020-06-22 ENCOUNTER — Other Ambulatory Visit: Payer: Self-pay

## 2020-06-22 ENCOUNTER — Ambulatory Visit (AMBULATORY_SURGERY_CENTER): Payer: 59 | Admitting: Gastroenterology

## 2020-06-22 VITALS — BP 138/69 | HR 71 | Temp 97.7°F | Resp 14 | Ht 66.0 in | Wt 156.8 lb

## 2020-06-22 DIAGNOSIS — Z1211 Encounter for screening for malignant neoplasm of colon: Secondary | ICD-10-CM | POA: Diagnosis present

## 2020-06-22 DIAGNOSIS — D128 Benign neoplasm of rectum: Secondary | ICD-10-CM

## 2020-06-22 DIAGNOSIS — D127 Benign neoplasm of rectosigmoid junction: Secondary | ICD-10-CM

## 2020-06-22 MED ORDER — SODIUM CHLORIDE 0.9 % IV SOLN: 500.0000 mL | Freq: Once | INTRAVENOUS | Status: DC

## 2020-06-22 NOTE — Progress Notes (Signed)
Report to PACU, RN, vss, BBS= Clear.  

## 2020-06-22 NOTE — Op Note (Signed)
[facility] Patient Name: Crystal [name] Procedure Date: 06/22/2020 11:52 AM MRN: [id number] Endoscopist: Justice Britain , MD Age: 59 Referring MD:  Date of Birth: 1961/05/12 Gender: Female Account #: [id number] Procedure:                Colonoscopy Indications:              Screening for colorectal malignant neoplasm, This                            is the patient's first colonoscopy Medicines:                Monitored Anesthesia Care Procedure:                Pre-Anesthesia Assessment:                           - Prior to the procedure, a History and Physical                            was performed, and patient medications and                            allergies were reviewed. The patient's tolerance of                            previous anesthesia was also reviewed. The risks                            and benefits of the procedure and the sedation                            options and risks were discussed with the patient.                            All questions were answered, and informed consent                            was obtained. Prior Anticoagulants: The patient has                            taken no previous anticoagulant or antiplatelet                            agents. ASA Grade Assessment: II - A patient with                            mild systemic disease. After reviewing the risks                            and benefits, the patient was deemed in                            satisfactory condition to undergo the procedure.  After obtaining informed consent, the colonoscope                            was passed under direct vision. Throughout the                            procedure, the patient's blood pressure, pulse, and                            oxygen saturations were monitored continuously. The                            Colonoscope was introduced through the anus and                            advanced to the 5 cm  into the ileum. The                            colonoscopy was performed without difficulty. The                            patient tolerated the procedure. The quality of the                            bowel preparation was adequate. The terminal ileum,                            ileocecal valve, appendiceal orifice, and rectum                            were photographed. Scope In: 93:73:42 PM Scope Out: 12:27:13 PM Scope Withdrawal Time: 0 hours 15 minutes 11 seconds  Total Procedure Duration: 0 hours 20 minutes 29 seconds  Findings:                 The digital rectal exam findings include                            hemorrhoids. Pertinent negatives include no                            palpable rectal lesions.                           Extensive amounts of liquid semi-liquid semi-solid                            stool was found in the entire colon, interfering                            with visualization. Lavage of the area was                            performed using copious amounts, resulting in  clearance with adequate visualization.                           The terminal ileum and ileocecal valve appeared                            normal.                           Five sessile polyps were found in the rectum (4)                            and recto-sigmoid colon (1). The polyps were 2 to 5                            mm in size. These polyps were removed with a cold                            snare. Resection and retrieval were complete.                           Many small and large-mouthed diverticula were found                            in the entire colon including cecum - though the                            more significant burden in the left colon.                           Normal mucosa was found in the entire colon                            otherwise.                           Non-bleeding non-thrombosed internal hemorrhoids                             were found during retroflexion, during perianal                            exam and during digital exam. The hemorrhoids were                            Grade II (internal hemorrhoids that prolapse but                            reduce spontaneously). Complications:            No immediate complications. Estimated Blood Loss:     Estimated blood loss was minimal. Impression:               - Hemorrhoids found on digital rectal exam.                           -  Stool in the entire examined colon.                           - The examined portion of the ileum was normal.                           - Five 2 to 5 mm polyps in the rectum and at the                            recto-sigmoid colon, removed with a cold snare.                            Resected and retrieved.                           - Diverticulosis in the entire examined colon.                           - Normal mucosa in the entire examined colon                            otherwise.                           - Non-bleeding non-thrombosed internal hemorrhoids. Recommendation:           - The patient will be observed post-procedure,                            until all discharge criteria are met.                           - Discharge patient to home.                           - Patient has a contact number available for                            emergencies. The signs and symptoms of potential                            delayed complications were discussed with the                            patient. Return to normal activities tomorrow.                            Written discharge instructions were provided to the                            patient.                           - High fiber diet.                           -  Use FiberCon 1-2 tablets PO daily.                           - Continue present medications.                           - Await pathology results.                           - Repeat colonoscopy in  3/02/10/09 years for                            surveillance based on pathology results and                            findings of adenomatous tissue.                           - Patient described >20 years of left sided                            discomfort that has been ongoing while eating spicy                            foods that comes/goes. Not clearly demonstrated                            that diverticular disease is etiology. Follow up in                            clinic to discuss things further and consideration                            of additional workup including EGD and possible                            cross-sectional imaging can be made in the coming                            weeks.                           - The findings and recommendations were discussed                            with the patient. Justice Britain, MD 06/22/2020 12:35:47 PM

## 2020-06-22 NOTE — Progress Notes (Signed)
Called to room to assist during endoscopic procedure.  Patient ID and intended procedure confirmed with present staff. Received instructions for my participation in the procedure from the performing physician.  

## 2020-06-22 NOTE — Progress Notes (Signed)
Pt's states no medical or surgical changes since previsit or office visit. 

## 2020-06-22 NOTE — Patient Instructions (Signed)
Impression/Recommendations:  Polyp handout given to patient. Hemorrhoid handout given to patient. Diverticulosis handout given to patient. High fiber diet handout given to patient.  Use FiberCon 1-2 tablets by mouth per day.  Continue present medications. Await pathology results.  YOU HAD AN ENDOSCOPIC PROCEDURE TODAY AT [facility]:   Refer to the procedure report that was given to you for any specific questions about what was found during the examination.  If the procedure report does not answer your questions, please call your gastroenterologist to clarify.  If you requested that your care partner not be given the details of your procedure findings, then the procedure report has been included in a sealed envelope for you to review at your convenience later.  YOU SHOULD EXPECT: Some feelings of bloating in the abdomen. Passage of more gas than usual.  Walking can help get rid of the air that was put into your GI tract during the procedure and reduce the bloating. If you had a lower endoscopy (such as a colonoscopy or flexible sigmoidoscopy) you may notice spotting of blood in your stool or on the toilet paper. If you underwent a bowel prep for your procedure, you may not have a normal bowel movement for a few days.  Please Note:  You might notice some irritation and congestion in your nose or some drainage.  This is from the oxygen used during your procedure.  There is no need for concern and it should clear up in a day or so.  SYMPTOMS TO REPORT IMMEDIATELY:   Following lower endoscopy (colonoscopy or flexible sigmoidoscopy):  Excessive amounts of blood in the stool  Significant tenderness or worsening of abdominal pains  Swelling of the abdomen that is new, acute  Fever of 100F or higher For urgent or emergent issues, a gastroenterologist can be reached at any hour by calling [phone]. Do not use MyChart messaging for urgent concerns.    DIET:  We do  recommend a small meal at first, but then you may proceed to your regular diet.  Drink plenty of fluids but you should avoid alcoholic beverages for 24 hours.  ACTIVITY:  You should plan to take it easy for the rest of today and you should NOT DRIVE or use heavy machinery until tomorrow (because of the sedation medicines used during the test).    FOLLOW UP: Our staff will call the number listed on your records 48-72 hours following your procedure to check on you and address any questions or concerns that you may have regarding the information given to you following your procedure. If we do not reach you, we will leave a message.  We will attempt to reach you two times.  During this call, we will ask if you have developed any symptoms of COVID 19. If you develop any symptoms (ie: fever, flu-like symptoms, shortness of breath, cough etc.) before then, please call [phone].  If you test positive for Covid 19 in the 2 weeks post procedure, please call and report this information to [place].    If any biopsies were taken you will be contacted by phone or by letter within the next 1-3 weeks.  Please call us at [phone] if you have not heard about the biopsies in 3 weeks.    SIGNATURES/CONFIDENTIALITY: You and/or your care partner have signed paperwork which will be entered into your electronic medical record.  These signatures attest to the fact that that the information above on your After Visit Summary has  been reviewed and is understood.  Full responsibility of the confidentiality of this discharge information lies with you and/or your care-partner.

## 2020-06-26 ENCOUNTER — Telehealth: Payer: Self-pay | Admitting: *Deleted

## 2020-06-26 ENCOUNTER — Telehealth: Payer: Self-pay

## 2020-06-26 NOTE — Telephone Encounter (Signed)
  Follow up Call-  Call back number 06/22/2020  Post procedure Call Back phone  # Ceasar Lund (son) [phone]  Permission to leave phone message Yes  Some recent data might be hidden     Patient questions:  Do you have a fever, pain , or abdominal swelling? No. Pain Score  0 *  Have you tolerated food without any problems? Yes.    Have you been able to return to your normal activities? Yes.    Do you have any questions about your discharge instructions: Diet   No. Medications  No. Follow up visit  No.  Do you have questions or concerns about your Care? No.  Actions: * If pain score is 4 or above: No action needed, pain <4.  Spoke with pts son.  He states that she is doing well.

## 2020-06-26 NOTE — Telephone Encounter (Signed)
Called [phone] and left a message we tried to reach pt for a follow up call. maw

## 2020-06-27 ENCOUNTER — Encounter: Payer: Self-pay | Admitting: Gastroenterology

## 2020-07-13 ENCOUNTER — Other Ambulatory Visit: Payer: Self-pay | Admitting: Nurse Practitioner

## 2020-07-13 DIAGNOSIS — I10 Essential (primary) hypertension: Secondary | ICD-10-CM

## 2020-07-13 MED FILL — methIMAzole 10 MG TABS: 10 | 30 days supply | Qty: 30 | Fill #4

## 2020-07-13 MED FILL — SIMVASTATIN 20 MG TABLET: 20 | 30 days supply | Qty: 30 | Fill #3

## 2020-07-13 MED FILL — METFORMIN HCL 500 MG TABS: 500 | 30 days supply | Qty: 120 | Fill #2

## 2020-07-13 MED FILL — ONE TOUCH VERIO TEST STRIP: 30 days supply | Qty: 100 | Fill #1

## 2020-07-13 MED FILL — GLIMEPIRIDE 4 MG TABS: 4 | 30 days supply | Qty: 60 | Fill #5

## 2020-07-13 MED FILL — JANUVIA 100 MG TABLET: 100 | 30 days supply | Qty: 30 | Fill #6

## 2020-07-14 ENCOUNTER — Other Ambulatory Visit: Payer: Self-pay

## 2020-07-14 DIAGNOSIS — I10 Essential (primary) hypertension: Secondary | ICD-10-CM

## 2020-08-11 ENCOUNTER — Encounter: Payer: Self-pay | Admitting: Nurse Practitioner

## 2020-08-11 ENCOUNTER — Other Ambulatory Visit: Payer: Self-pay | Admitting: Nurse Practitioner

## 2020-08-11 ENCOUNTER — Ambulatory Visit (INDEPENDENT_AMBULATORY_CARE_PROVIDER_SITE_OTHER): Payer: 59 | Admitting: Nurse Practitioner

## 2020-08-11 ENCOUNTER — Other Ambulatory Visit: Payer: Self-pay

## 2020-08-11 VITALS — BP 140/61 | HR 74 | Temp 98.1°F | Resp 16 | Ht 64.0 in | Wt 164.0 lb

## 2020-08-11 DIAGNOSIS — K219 Gastro-esophageal reflux disease without esophagitis: Secondary | ICD-10-CM

## 2020-08-11 DIAGNOSIS — L679 Hair color and hair shaft abnormality, unspecified: Secondary | ICD-10-CM

## 2020-08-11 DIAGNOSIS — E0590 Thyrotoxicosis, unspecified without thyrotoxic crisis or storm: Secondary | ICD-10-CM | POA: Diagnosis not present

## 2020-08-11 DIAGNOSIS — E785 Hyperlipidemia, unspecified: Secondary | ICD-10-CM

## 2020-08-11 DIAGNOSIS — E119 Type 2 diabetes mellitus without complications: Secondary | ICD-10-CM

## 2020-08-11 DIAGNOSIS — I10 Essential (primary) hypertension: Secondary | ICD-10-CM

## 2020-08-11 LAB — POCT URINALYSIS DIPSTICK
Bilirubin, UA: NEGATIVE
Blood, UA: NEGATIVE
Glucose, UA: NEGATIVE
Ketones, UA: NEGATIVE
Leukocytes, UA: NEGATIVE
Nitrite, UA: NEGATIVE
Protein, UA: NEGATIVE
Spec Grav, UA: 1.015 (ref 1.010–1.025)
Urobilinogen, UA: 0.2 E.U./dL
pH, UA: 5.5 (ref 5.0–8.0)

## 2020-08-11 LAB — GLUCOSE, POCT (MANUAL RESULT ENTRY): POC Glucose: 231 mg/dl — AB (ref 70–99)

## 2020-08-11 LAB — POCT GLYCOSYLATED HEMOGLOBIN (HGB A1C)
HbA1c POC (<> result, manual entry): 8.9 % (ref 4.0–5.6)
HbA1c, POC (controlled diabetic range): 8.9 % — AB (ref 0.0–7.0)
HbA1c, POC (prediabetic range): 8.9 % — AB (ref 5.7–6.4)
Hemoglobin A1C: 8.9 % — AB (ref 4.0–5.6)

## 2020-08-11 MED ORDER — PANTOPRAZOLE SODIUM 40 MG PO TBEC: 40.0000 mg | DELAYED_RELEASE_TABLET | Freq: Every day | ORAL | 3 refills | Status: DC

## 2020-08-11 MED ORDER — VITAMIN D3 125 MCG (5000 UT) PO CAPS
1.0000 | ORAL_CAPSULE | Freq: Every day | ORAL | 5 refills | Status: AC
Start: 2020-08-11 — End: 2021-02-07

## 2020-08-11 MED ORDER — METFORMIN HCL 500 MG PO TABS: 1000.0000 mg | ORAL_TABLET | Freq: Two times a day (BID) | ORAL | 3 refills | Status: DC

## 2020-08-11 MED ORDER — AMLODIPINE BESYLATE 10 MG PO TABS: 10.0000 mg | ORAL_TABLET | Freq: Every day | ORAL | 3 refills | Status: DC

## 2020-08-11 MED ORDER — JANUVIA 100 MG PO TABS: 100.0000 mg | ORAL_TABLET | Freq: Every day | ORAL | 3 refills | Status: DC

## 2020-08-11 MED ORDER — VITAMINS FOR THE HAIR PO TABS: 1.0000 | ORAL_TABLET | Freq: Every day | ORAL | 3 refills | Status: AC

## 2020-08-11 MED ORDER — SIMVASTATIN 20 MG PO TABS: 20.0000 mg | ORAL_TABLET | Freq: Every evening | ORAL | 3 refills | Status: DC

## 2020-08-11 MED FILL — SIMVASTATIN 20 MG TABLET: 20 | 90 days supply | Qty: 90 | Fill #0

## 2020-08-11 MED FILL — PANTOPRAZOLE SOD DR 40 MG T: 40 | 90 days supply | Qty: 90 | Fill #0

## 2020-08-11 MED FILL — AMLODIPINE BESYLATE 10 MG T: 10 | 90 days supply | Qty: 90 | Fill #0

## 2020-08-11 MED FILL — METFORMIN HCL 500 MG TABS: 500 | 90 days supply | Qty: 360 | Fill #0

## 2020-08-11 MED FILL — JANUVIA 100 MG TABLET: 100 | 90 days supply | Qty: 90 | Fill #0

## 2020-08-11 NOTE — Progress Notes (Signed)
Crystal [name], [place]  [id number] Phone:  [phone]   Fax:  [phone]   Established Patient Office Visit  Subjective:  Patient ID: Crystal [name], female    DOB: 02-19-1961  Age: 59 y.o. MRN: [id number]  CC:  Chief Complaint  Patient presents with  . Follow-up    HPI Crystal [name] presents for follow up. She  has a past medical history of Allergy, Diabetes mellitus, GERD (gastroesophageal reflux disease), Hyperlipidemia, Hypertension, and Hyperthyroidism.   Diabetes Mellitus Patient presents for follow up of diabetes. Current symptoms include: hyperglycemia. Symptoms have stabilized. Patient denies foot ulcerations, increased appetite, nausea, paresthesia of the feet, polydipsia, polyuria, vomiting and weight loss. Evaluation to date has included: fasting blood sugar, fasting lipid panel, hemoglobin A1C and microalbuminuria.  Home sugars: varies . Current treatment: Continued sulfonylurea which has been somewhat effective, Continued metformin which has been somewhat effective, Continued statin which has been somewhat effective, Continued Januvia which has been somewhat effective and Effective. Last dilated eye exam: Referral pending.  She admits that she has been more compliant with her diet she has discontinued the use of bread.  She is taking her Januvia as directed.  She denies abdominal symptoms as long she takes it with food.    Crystal [name] IOEVOJ is also here for follow up of elevated cholesterol. Compliance with treatment has been poor. The patient exercises rarely. Patient denies muscle pain associated with her medications because she is not taking her medication.  She has tried some holistic approaches to treat her cholesterol.   Past Medical History:  Diagnosis Date  . Allergy   . Diabetes mellitus   . GERD (gastroesophageal reflux disease)   . Hyperlipidemia   . Hypertension   . Hyperthyroidism     Past  Surgical History:  Procedure Laterality Date  . ANTERIOR CERVICAL DECOMP/DISCECTOMY FUSION N/A 10/11/2016   Procedure: ANTERIOR CERVICAL DISCECTOMY FUSION C6-7 WITH EXCISION OF OSTEOPHYTE;  Surgeon: [name], MD;  Location: [facility];  Service: Orthopedics;  Laterality: N/A;  . CESAREAN SECTION    . TUBAL LIGATION      Family History  Problem Relation Age of Onset  . Colon cancer Neg Hx   . Colon polyps Neg Hx   . Esophageal cancer Neg Hx   . Rectal cancer Neg Hx   . Stomach cancer Neg Hx     Social History   Socioeconomic History  . Marital status: Married    Spouse name: Not on file  . Number of children: Not on file  . Years of education: Not on file  . Highest education level: Not on file  Occupational History  . Not on file  Tobacco Use  . Smoking status: Never Smoker  . Smokeless tobacco: Never Used  Substance and Sexual Activity  . Alcohol use: No  . Drug use: No  . Sexual activity: Not on file  Other Topics Concern  . Not on file  Social History Narrative  . Not on file   Social Determinants of Health   Financial Resource Strain:   . Difficulty of Paying Living Expenses: Not on file  Food Insecurity:   . Worried About Charity fundraiser in the Last Year: Not on file  . Ran Out of Food in the Last Year: Not on file  Transportation Needs:   . Lack of Transportation (Medical): Not on file  . Lack of Transportation (Non-Medical): Not on file  Physical Activity:   . Days of Exercise per Week: Not on file  . Minutes of Exercise per Session: Not on file  Stress:   . Feeling of Stress : Not on file  Social Connections:   . Frequency of Communication with Friends and Family: Not on file  . Frequency of Social Gatherings with Friends and Family: Not on file  . Attends Religious Services: Not on file  . Active Member of Clubs or Organizations: Not on file  . Attends Archivist Meetings: Not on file  . Marital Status: Not on file  Intimate Partner  Violence:   . Fear of Current or Ex-Partner: Not on file  . Emotionally Abused: Not on file  . Physically Abused: Not on file  . Sexually Abused: Not on file    Outpatient Medications Prior to Visit  Medication Sig Dispense Refill  . acetaminophen (TYLENOL) 500 MG tablet Take 1,000 mg by mouth daily as needed for moderate pain.     [name] atenolol (TENORMIN) 25 MG tablet Take 1 tablet (25 mg total) by mouth daily. 30 tablet 0  . blood glucose meter kit and supplies One Touch Verio. Use up to four times daily as directed. (FOR ICD-10 E10.9, E11.9). 1 each 0  . fluticasone (FLONASE) 50 MCG/ACT nasal spray Place 2 sprays into both nostrils daily. 16 g 6  . glimepiride (AMARYL) 4 MG tablet Take 2 tablets (8 mg total) by mouth daily before breakfast. 180 tablet 3  . glucose blood test strip One touch verio 100 each 12  . Lancets (ONETOUCH ULTRASOFT) lancets Use as instructed 100 each 12  . loratadine (CLARITIN) 10 MG tablet Take 1 tablet (10 mg total) by mouth daily. (Patient not taking: Reported on 06/05/2020) 30 tablet 11  . methimazole (TAPAZOLE) 10 MG tablet Take 1 tablet (10 mg total) by mouth daily. 30 tablet 6  . pyrithione zinc (SM DANDRUFF SHAMPOO) 1 % shampoo Apply topically daily as needed for itching. 400 mL 12  . amLODipine (NORVASC) 10 MG tablet TAKE 1 TABLET BY MOUTH DAILY. 30 tablet 5  . Cholecalciferol (VITAMIN D HIGH POTENCY) 25 MCG (1000 UT) capsule Take 1 capsule (1,000 Units total) by mouth daily. 90 capsule 3  . JANUVIA 100 MG tablet Take 100 mg by mouth daily.    . metFORMIN (GLUCOPHAGE) 500 MG tablet Take 2 tablets (1,000 mg total) by mouth 2 (two) times daily with a meal. 120 tablet 11  . pantoprazole (PROTONIX) 40 MG tablet TAKE 1 TABLET (40 MG TOTAL) BY MOUTH DAILY. 30 tablet 3  . simvastatin (ZOCOR) 20 MG tablet Take 1 tablet (20 mg total) by mouth every evening. 30 tablet 11  . sitaGLIPtin (JANUVIA) 25 MG tablet Take 1 tablet (25 mg total) by mouth 4 (four) times daily. 120  tablet 11   Facility-Administered Medications Prior to Visit  Medication Dose Route Frequency Provider Last Rate Last Admin  . MEDERMA gel   Topical Once [name], MD        Allergies  Allergen Reactions  . Aspirin Nausea And Vomiting  . Tramadol     Upset stomach     ROS Review of Systems    Objective:    Physical Exam Constitutional:      General: She is not in acute distress.    Appearance: She is not ill-appearing, toxic-appearing or diaphoretic.  HENT:     Nose: Nose normal.     Mouth/Throat:     Mouth: Mucous  membranes are moist.  Cardiovascular:     Rate and Rhythm: Normal rate and regular rhythm.     Pulses: Normal pulses.     Heart sounds: Normal heart sounds.  Pulmonary:     Effort: Pulmonary effort is normal.     Breath sounds: Normal breath sounds.  Abdominal:     General: Bowel sounds are normal.     Palpations: Abdomen is soft.  Musculoskeletal:        General: Normal range of motion.     Cervical back: Normal range of motion.  Skin:    General: Skin is warm and dry.     Capillary Refill: Capillary refill takes less than 2 seconds.  Neurological:     General: No focal deficit present.     Mental Status: She is alert and oriented to person, place, and time.  Psychiatric:        Mood and Affect: Mood normal.        Behavior: Behavior normal.        Thought Content: Thought content normal.        Judgment: Judgment normal.     BP 140/61 (BP Location: Left Arm, Patient Position: Sitting, Cuff Size: Normal)   Pulse 74   Temp 98.1 F (36.7 C)   Resp 16   Ht $R'5\' 4"'IH$  (1.626 m)   Wt 164 lb (74.4 kg)   LMP 06/02/2013   SpO2 99%   BMI 28.15 kg/m  Wt Readings from Last 3 Encounters:  08/11/20 164 lb (74.4 kg)  06/22/20 156 lb 12.8 oz (71.1 kg)  06/05/20 156 lb 12.8 oz (71.1 kg)     Health Maintenance Due  Topic Date Due  . OPHTHALMOLOGY EXAM  Never done    There are no preventive care reminders to display for this patient.  Lab  Results  Component Value Date   TSH <0.005 (L) 05/12/2020   Lab Results  Component Value Date   WBC 7.4 05/12/2020   HGB 13.1 05/12/2020   HCT 42.3 05/12/2020   MCV 75 (L) 05/12/2020   PLT 346 05/12/2020   Lab Results  Component Value Date   NA 133 (L) 05/12/2020   K 4.5 05/12/2020   CO2 24 12/15/2019   GLUCOSE 285 (H) 05/12/2020   BUN 10 05/12/2020   CREATININE 0.42 (L) 05/12/2020   BILITOT 0.5 05/12/2020   ALKPHOS 131 (H) 05/12/2020   AST 19 05/12/2020   ALT 30 12/15/2019   PROT 7.1 05/12/2020   ALBUMIN 4.1 05/12/2020   CALCIUM 9.7 05/12/2020   ANIONGAP 10 11/08/2016   GFR 204.34 12/15/2019   Lab Results  Component Value Date   CHOL 246 (H) 02/11/2020   Lab Results  Component Value Date   HDL 42 02/11/2020   Lab Results  Component Value Date   LDLCALC 182 (H) 02/11/2020   Lab Results  Component Value Date   TRIG 120 02/11/2020   Lab Results  Component Value Date   CHOLHDL 5.9 (H) 02/11/2020   Lab Results  Component Value Date   HGBA1C 8.9 (A) 08/11/2020   HGBA1C 8.9 08/11/2020   HGBA1C 8.9 (A) 08/11/2020   HGBA1C 8.9 (A) 08/11/2020      Assessment & Plan:   Problem List Items Addressed This Visit      Digestive   GERD   Relevant Medications   pantoprazole (PROTONIX) 40 MG tablet     Endocrine   Hyperthyroidism She will follow-up with endocrinology   Diabetes mellitus ([place]) -  Primary   Relevant Medications  Congratulated patient on current achievement and A1c.  Encourage compliance with current treatment regimen.  Did speak with her related to diabetes and cholesterol and the importance of stroke prevention Encourage regular CBG monitoring. Encourage contacting office if excessive hyperglycemia and or hypoglycemia Continue with lifestyle modification with healthy diet (fewer calories, more high fiber foods, whole grains and non-starchy vegetables, lower fat meat and fish, low-fat diary include healthy oils) regular exercise (physical  activity) and weight loss Opthalmology exam discussed referral placed Regular dental visits encouraged referral placed Home BP monitoring also encouraged goal <130/80     JANUVIA 100 MG tablet   metFORMIN (GLUCOPHAGE) 500 MG tablet   simvastatin (ZOCOR) 20 MG tablet   Other Relevant Orders   Urinalysis Dipstick (Completed)   POC Glucose (CBG) (Completed)   POC HgB A1c (Completed)   Microalbumin, urine   Comp. Metabolic Panel (12)   Ambulatory referral to Ophthalmology   Ambulatory referral to Dentistry     Other   Dyslipidemia  Continue dietary measures   Relevant Medications   simvastatin (ZOCOR) 20 MG tablet   Other Relevant Orders   Lipid panel    Other Visit Diagnoses    Essential hypertension     Encouraged on going compliance with current medication regimen Encouraged home monitoring and recording BP <130/80 Eating a heart-healthy diet with less salt Encouraged regular physical activity     Relevant Medications   amLODipine (NORVASC) 10 MG tablet   simvastatin (ZOCOR) 20 MG tablet   Hair changes       Relevant Orders   Ambulatory referral to Dermatology      Meds ordered this encounter  Medications  . amLODipine (NORVASC) 10 MG tablet    Sig: Take 1 tablet (10 mg total) by mouth daily.    Dispense:  90 tablet    Refill:  3    Order Specific Question:   Supervising Provider    Answer:   Tresa Garter [id number]  . JANUVIA 100 MG tablet    Sig: Take 1 tablet (100 mg total) by mouth daily.    Dispense:  90 tablet    Refill:  3    Order Specific Question:   Supervising Provider    Answer:   Tresa Garter [id number]  . metFORMIN (GLUCOPHAGE) 500 MG tablet    Sig: Take 2 tablets (1,000 mg total) by mouth 2 (two) times daily with a meal.    Dispense:  360 tablet    Refill:  3    Order Specific Question:   Supervising Provider    Answer:   Tresa Garter [id number]  . pantoprazole (PROTONIX) 40 MG tablet    Sig: Take 1 tablet (40 mg total)  by mouth daily.    Dispense:  90 tablet    Refill:  3    Order Specific Question:   Supervising Provider    Answer:   Tresa Garter [id number]  . simvastatin (ZOCOR) 20 MG tablet    Sig: Take 1 tablet (20 mg total) by mouth every evening.    Dispense:  90 tablet    Refill:  3    Order Specific Question:   Supervising Provider    Answer:   Tresa Garter [id number]  . Cholecalciferol (VITAMIN D3) 125 MCG (5000 UT) CAPS    Sig: Take 1 capsule (5,000 Units total) by mouth daily with breakfast. Take along with calcium and magnesium.    Dispense:  30  capsule    Refill:  5    May substitute with similar over-the-counter product.    Order Specific Question:   Supervising Provider    Answer:   Tresa Garter [id number]  . Specialty Vitamins Products (VITAMINS FOR THE HAIR) TABS    Sig: Take 1 tablet by mouth daily.    Dispense:  90 tablet    Refill:  3    Order Specific Question:   Supervising Provider    Answer:   Tresa Garter [[id number]]    Follow-up: Return for well woman physcial in 3-4 months outside of her regular 68mon fu visit.    Vevelyn Francois, NP

## 2020-08-11 NOTE — Patient Instructions (Addendum)
[facility] Address: [address] [place], [place] [address] Phone: [phone]     Diabetes Mellitus and Nutrition, Adult When you have diabetes (diabetes mellitus), it is very important to have healthy eating habits because your blood sugar (glucose) levels are greatly affected by what you eat and drink. Eating healthy foods in the appropriate amounts, at about the same times every day, can help you:  Control your blood glucose.  Lower your risk of heart disease.  Improve your blood pressure.  Reach or maintain a healthy weight. Every person with diabetes is different, and each person has different needs for a meal plan. Your health care provider may recommend that you work with a diet and nutrition specialist (dietitian) to make a meal plan that is best for you. Your meal plan may vary depending on factors such as:  The calories you need.  The medicines you take.  Your weight.  Your blood glucose, blood pressure, and cholesterol levels.  Your activity level.  Other health conditions you have, such as heart or kidney disease. How do carbohydrates affect me? Carbohydrates, also called carbs, affect your blood glucose level more than any other type of food. Eating carbs naturally raises the amount of glucose in your blood. Carb counting is a method for keeping track of how many carbs you eat. Counting carbs is important to keep your blood glucose at a healthy level, especially if you use insulin or take certain oral diabetes medicines. It is important to know how many carbs you can safely have in each meal. This is different for every person. Your dietitian can help you calculate how many carbs you should have at each meal and for each snack. Foods that contain carbs include:  Bread, cereal, rice, pasta, and crackers.  Potatoes and corn.  Peas, beans, and lentils.  Milk and yogurt.  Fruit and juice.  Desserts, such as cakes, cookies, ice cream, and  candy. How does alcohol affect me? Alcohol can cause a sudden decrease in blood glucose (hypoglycemia), especially if you use insulin or take certain oral diabetes medicines. Hypoglycemia can be a life-threatening condition. Symptoms of hypoglycemia (sleepiness, dizziness, and confusion) are similar to symptoms of having too much alcohol. If your health care provider says that alcohol is safe for you, follow these guidelines:  Limit alcohol intake to no more than 1 drink per day for nonpregnant women and 2 drinks per day for men. One drink equals 12 oz of beer, 5 oz of wine, or 1 oz of hard liquor.  Do not drink on an empty stomach.  Keep yourself hydrated with water, diet soda, or unsweetened iced tea.  Keep in mind that regular soda, juice, and other mixers may contain a lot of sugar and must be counted as carbs. What are tips for following this plan?  Reading food labels  Start by checking the serving size on the "Nutrition Facts" label of packaged foods and drinks. The amount of calories, carbs, fats, and other nutrients listed on the label is based on one serving of the item. Many items contain more than one serving per package.  Check the total grams (g) of carbs in one serving. You can calculate the number of servings of carbs in one serving by dividing the total carbs by 15. For example, if a food has 30 g of total carbs, it would be equal to 2 servings of carbs.  Check the number of grams (g) of saturated and trans fats  in one serving. Choose foods that have low or no amount of these fats.  Check the number of milligrams (mg) of salt (sodium) in one serving. Most people should limit total sodium intake to less than 2,300 mg per day.  Always check the nutrition information of foods labeled as "low-fat" or "nonfat". These foods may be higher in added sugar or refined carbs and should be avoided.  Talk to your dietitian to identify your daily goals for nutrients listed on the  label. Shopping  Avoid buying canned, premade, or processed foods. These foods tend to be high in fat, sodium, and added sugar.  Shop around the outside edge of the grocery store. This includes fresh fruits and vegetables, bulk grains, fresh meats, and fresh dairy. Cooking  Use low-heat cooking methods, such as baking, instead of high-heat cooking methods like deep frying.  Cook using healthy oils, such as olive, canola, or sunflower oil.  Avoid cooking with butter, cream, or high-fat meats. Meal planning  Eat meals and snacks regularly, preferably at the same times every day. Avoid going long periods of time without eating.  Eat foods high in fiber, such as fresh fruits, vegetables, beans, and whole grains. Talk to your dietitian about how many servings of carbs you can eat at each meal.  Eat 4-6 ounces (oz) of lean protein each day, such as lean meat, chicken, fish, eggs, or tofu. One oz of lean protein is equal to: ? 1 oz of meat, chicken, or fish. ? 1 egg. ?  cup of tofu.  Eat some foods each day that contain healthy fats, such as avocado, nuts, seeds, and fish. Lifestyle  Check your blood glucose regularly.  Exercise regularly as told by your health care provider. This may include: ? 150 minutes of moderate-intensity or vigorous-intensity exercise each week. This could be brisk walking, biking, or water aerobics. ? Stretching and doing strength exercises, such as yoga or weightlifting, at least 2 times a week.  Take medicines as told by your health care provider.  Do not use any products that contain nicotine or tobacco, such as cigarettes and e-cigarettes. If you need help quitting, ask your health care provider.  Work with a Social worker or diabetes educator to identify strategies to manage stress and any emotional and social challenges. Questions to ask a health care provider  Do I need to meet with a diabetes educator?  Do I need to meet with a dietitian?  What  number can I call if I have questions?  When are the best times to check my blood glucose? Where to find more information:  American Diabetes Association: diabetes.org  Academy of Nutrition and Dietetics: www.eatright.[employer] of Diabetes and Digestive and Kidney Diseases (NIH): [employer] Summary  A healthy meal plan will help you control your blood glucose and maintain a healthy lifestyle.  Working with a diet and nutrition specialist (dietitian) can help you make a meal plan that is best for you.  Keep in mind that carbohydrates (carbs) and alcohol have immediate effects on your blood glucose levels. It is important to count carbs and to use alcohol carefully. This information is not intended to replace advice given to you by your health care provider. Make sure you discuss any questions you have with your health care provider. Document Revised: 09/05/2017 Document Reviewed: 10/28/2016 Elsevier Patient Education  2020 [employer].

## 2020-08-12 LAB — COMP. METABOLIC PANEL (12)
AST: 20 IU/L (ref 0–40)
Albumin/Globulin Ratio: 1.5 (ref 1.2–2.2)
Albumin: 4.1 g/dL (ref 3.8–4.9)
Alkaline Phosphatase: 108 IU/L (ref 44–121)
BUN/Creatinine Ratio: 29 — ABNORMAL HIGH (ref 9–23)
BUN: 12 mg/dL (ref 6–24)
Bilirubin Total: 0.5 mg/dL (ref 0.0–1.2)
Calcium: 9.6 mg/dL (ref 8.7–10.2)
Chloride: 103 mmol/L (ref 96–106)
Creatinine, Ser: 0.42 mg/dL — ABNORMAL LOW (ref 0.57–1.00)
GFR calc Af Amer: 131 mL/min/{1.73_m2} (ref >59)
GFR calc non Af Amer: 113 mL/min/{1.73_m2} (ref >59)
Globulin, Total: 2.7 g/dL (ref 1.5–4.5)
Glucose: 205 mg/dL — ABNORMAL HIGH (ref 65–99)
Potassium: 4.7 mmol/L (ref 3.5–5.2)
Sodium: 143 mmol/L (ref 134–144)
Total Protein: 6.8 g/dL (ref 6.0–8.5)

## 2020-08-12 LAB — LIPID PANEL
Chol/HDL Ratio: 4.5 ratio — ABNORMAL HIGH (ref 0.0–4.4)
Cholesterol, Total: 193 mg/dL (ref 100–199)
HDL: 43 mg/dL (ref >39)
LDL Chol Calc (NIH): 130 mg/dL — ABNORMAL HIGH (ref 0–99)
Triglycerides: 112 mg/dL (ref 0–149)
VLDL Cholesterol Cal: 20 mg/dL (ref 5–40)

## 2020-08-12 LAB — MICROALBUMIN, URINE: Microalbumin, Urine: <3 ug/mL

## 2020-08-14 MED FILL — methIMAzole 10 MG TABS: 10 | 30 days supply | Qty: 30 | Fill #5

## 2020-08-16 MED FILL — GLIMEPIRIDE 4 MG TABS: 4 | 30 days supply | Qty: 60 | Fill #6

## 2020-08-16 NOTE — Progress Notes (Signed)
Pt was called to discuss her lab results. Pt didn't understand @ first but I explained her levels from 3 months ago to her levels now and what the levels the provider would like it to be. After discussing the levels pt stated she understood and will keep her f/u appt.

## 2020-09-19 MED FILL — GLIMEPIRIDE 4 MG TABS: 4 | 30 days supply | Qty: 60 | Fill #7

## 2020-10-04 ENCOUNTER — Other Ambulatory Visit: Payer: Self-pay | Admitting: Physician Assistant

## 2020-10-04 MED FILL — FLUOCINOLONE ACETONIDE SCAL: 0.01 | 30 days supply | Qty: 118 | Fill #0

## 2020-10-04 MED FILL — KETOCONAZOLE 2% SHAMPOO: 2 | 30 days supply | Qty: 120 | Fill #0

## 2020-10-04 MED FILL — DOXYCYCLINE HYCLATE 100 MG: 100 | 30 days supply | Qty: 60 | Fill #0

## 2020-10-20 ENCOUNTER — Telehealth (INDEPENDENT_AMBULATORY_CARE_PROVIDER_SITE_OTHER): Payer: 59 | Admitting: Nurse Practitioner

## 2020-10-20 DIAGNOSIS — L299 Pruritus, unspecified: Secondary | ICD-10-CM | POA: Diagnosis not present

## 2020-10-23 DIAGNOSIS — L299 Pruritus, unspecified: Secondary | ICD-10-CM | POA: Insufficient documentation

## 2020-10-23 NOTE — Patient Instructions (Signed)
Ears itching:  May try debrox ear drops  Will see in office next week to assess in person  Follow up:  1 week

## 2020-10-23 NOTE — Progress Notes (Signed)
Virtual Visit via Telephone Note  I connected with Jeanne Diefendorf Dowlen on 10/23/20 at  2:00 PM EST by telephone and verified that I am speaking with the correct person using two identifiers.  Location: Patient: home Provider: remote   I discussed the limitations, risks, security and privacy concerns of performing an evaluation and management service by telephone and the availability of in person appointments. I also discussed with the patient that there may be a patient responsible charge related to this service. The patient expressed understanding and agreed to proceed.  Chief Complaint  Patient presents with  . Otalgia    States that ears are actually itching, but not painful       History of Present Illness:  Patient presents today for virtual televisit.  Arabic interpreter was used for this phone call.  Patient states that her ears are itching.  She denies any other symptoms.  We discussed that this could be due to impacted cerumen.  It is hard to assess through televisit.  We discussed the patient can try Debrox eardrops.  We will see her in the office next week so that we can look at her ears.  Patient agrees to this. Denies f/c/s, n/v/d, hemoptysis, PND, chest pain or edema.  Observations/Objective:  Vitals with BMI 08/11/2020 06/22/2020 06/22/2020  Height 5\' 4"  - -  Weight 164 lbs - -  BMI 82.51 - -  Systolic [phone]  Diastolic 61 69 76  Pulse 74 71 71      Assessment and Plan:  Ears itching:  May try debrox ear drops  Will see in office next week to assess in person   Follow Up Instructions:  1 week    I discussed the assessment and treatment plan with the patient. The patient was provided an opportunity to ask questions and all were answered. The patient agreed with the plan and demonstrated an understanding of the instructions.   The patient was advised to call back or seek an in-person evaluation if the symptoms worsen or if the condition fails to  improve as anticipated.  I provided 22 minutes of non-face-to-face time during this encounter.   Fenton Foy, NP

## 2020-11-13 ENCOUNTER — Other Ambulatory Visit: Payer: Self-pay | Admitting: Nurse Practitioner

## 2020-11-13 ENCOUNTER — Telehealth: Payer: Self-pay | Admitting: Nurse Practitioner

## 2020-11-13 DIAGNOSIS — I10 Essential (primary) hypertension: Secondary | ICD-10-CM

## 2020-11-13 DIAGNOSIS — E119 Type 2 diabetes mellitus without complications: Secondary | ICD-10-CM

## 2020-11-13 MED ORDER — JANUVIA 100 MG PO TABS: 100.0000 mg | ORAL_TABLET | Freq: Every day | ORAL | 3 refills | Status: DC

## 2020-11-13 MED ORDER — GLIMEPIRIDE 4 MG PO TABS: 8.0000 mg | ORAL_TABLET | Freq: Every day | ORAL | 3 refills | Status: DC

## 2020-11-13 MED ORDER — AMLODIPINE BESYLATE 10 MG PO TABS: 10.0000 mg | ORAL_TABLET | Freq: Every day | ORAL | 3 refills | Status: DC

## 2020-11-13 MED ORDER — METFORMIN HCL 500 MG PO TABS: 1000.0000 mg | ORAL_TABLET | Freq: Two times a day (BID) | ORAL | 3 refills | Status: DC

## 2020-11-13 NOTE — Telephone Encounter (Signed)
sent 

## 2020-12-07 ENCOUNTER — Other Ambulatory Visit (HOSPITAL_COMMUNITY): Payer: Self-pay | Admitting: Nurse Practitioner

## 2020-12-07 ENCOUNTER — Other Ambulatory Visit: Payer: Self-pay

## 2020-12-07 ENCOUNTER — Encounter: Payer: Self-pay | Admitting: Nurse Practitioner

## 2020-12-07 ENCOUNTER — Ambulatory Visit (INDEPENDENT_AMBULATORY_CARE_PROVIDER_SITE_OTHER): Payer: 59 | Admitting: Nurse Practitioner

## 2020-12-07 VITALS — BP 155/75 | HR 99 | Ht 64.0 in | Wt 161.0 lb

## 2020-12-07 DIAGNOSIS — I10 Essential (primary) hypertension: Secondary | ICD-10-CM | POA: Diagnosis not present

## 2020-12-07 DIAGNOSIS — H6122 Impacted cerumen, left ear: Secondary | ICD-10-CM

## 2020-12-07 DIAGNOSIS — M542 Cervicalgia: Secondary | ICD-10-CM | POA: Diagnosis not present

## 2020-12-07 DIAGNOSIS — M6281 Muscle weakness (generalized): Secondary | ICD-10-CM | POA: Diagnosis not present

## 2020-12-07 DIAGNOSIS — E119 Type 2 diabetes mellitus without complications: Secondary | ICD-10-CM | POA: Diagnosis not present

## 2020-12-07 LAB — POCT GLYCOSYLATED HEMOGLOBIN (HGB A1C): Hemoglobin A1C: 9.7 % — AB (ref 4.0–5.6)

## 2020-12-07 MED ORDER — AMLODIPINE BESYLATE 10 MG PO TABS
10.0000 mg | ORAL_TABLET | Freq: Every day | ORAL | 3 refills | Status: DC
Start: 2020-12-07 — End: 2021-05-04

## 2020-12-07 MED ORDER — GLIMEPIRIDE 4 MG PO TABS: 8.0000 mg | ORAL_TABLET | Freq: Every day | ORAL | 3 refills | Status: DC

## 2020-12-07 MED ORDER — SIMVASTATIN 20 MG PO TABS: 20.0000 mg | ORAL_TABLET | Freq: Every evening | ORAL | 3 refills | Status: DC

## 2020-12-07 MED ORDER — PANTOPRAZOLE SODIUM 40 MG PO TBEC: 40.0000 mg | DELAYED_RELEASE_TABLET | Freq: Every day | ORAL | 3 refills | Status: DC

## 2020-12-07 MED ORDER — DEBROX 6.5 % OT SOLN: 5.0000 [drp] | Freq: Two times a day (BID) | OTIC | 0 refills | Status: DC

## 2020-12-07 MED ORDER — JANUVIA 100 MG PO TABS: 100.0000 mg | ORAL_TABLET | Freq: Every day | ORAL | 3 refills | Status: DC

## 2020-12-07 MED ORDER — METHIMAZOLE 10 MG PO TABS: 10.0000 mg | ORAL_TABLET | Freq: Every day | ORAL | 6 refills | Status: DC

## 2020-12-07 MED ORDER — ATENOLOL 25 MG PO TABS: 25.0000 mg | ORAL_TABLET | Freq: Every day | ORAL | 0 refills | Status: DC

## 2020-12-07 MED ORDER — FLUTICASONE PROPIONATE 50 MCG/ACT NA SUSP: 2.0000 | Freq: Every day | NASAL | 6 refills | Status: DC

## 2020-12-07 MED ORDER — METFORMIN HCL 500 MG PO TABS: 1000.0000 mg | ORAL_TABLET | Freq: Two times a day (BID) | ORAL | 3 refills | Status: DC

## 2020-12-07 MED FILL — JANUVIA 100 MG TABLET: 100 | 30 days supply | Qty: 30 | Fill #0

## 2020-12-07 MED FILL — SIMVASTATIN 20 MG TABLET: 20 | 90 days supply | Qty: 90 | Fill #0

## 2020-12-07 MED FILL — AMLODIPINE BESYLATE 10 MG T: 10 | 90 days supply | Qty: 90 | Fill #0

## 2020-12-07 MED FILL — METFORMIN HCL 500 MG TABS: 500 | 90 days supply | Qty: 360 | Fill #0

## 2020-12-07 MED FILL — FLUTICASONE PROP 50 MCG SPR: 50 | 30 days supply | Qty: 16 | Fill #0

## 2020-12-07 MED FILL — methIMAzole 10 MG TABS: 10 | 30 days supply | Qty: 30 | Fill #0

## 2020-12-07 MED FILL — PANTOPRAZOLE SOD DR 40 MG T: 40 | 90 days supply | Qty: 90 | Fill #0

## 2020-12-07 MED FILL — ATENOLOL 25 MG TABLET: 25 | 30 days supply | Qty: 30 | Fill #0

## 2020-12-07 NOTE — Progress Notes (Signed)
[address], [place]  [address] Phone:  [phone]   Fax:  [phone]   Established Patient Office Visit  Subjective:  Patient ID: Crystal [name], female    DOB: Jun 17, 1961  Age: 60 y.o. MRN: [id number]  CC:  Chief Complaint  Patient presents with  . Diabetes    HPI Crystal [name] presents for diabetes. She  has a past medical history of Allergy, Diabetes mellitus, GERD (gastroesophageal reflux disease), Hyperlipidemia, Hypertension, and Hyperthyroidism.   Diabetes Mellitus Patient presents for follow up of diabetes. Current symptoms include: hyperglycemia, paresthesia of the feet and visual disturbances. She admits that this related to her diet. She loves to eat and especially at night. She has been eating cereal however she know that this causes a spike in her CBG. Symptoms have progressed to a point and plateaued. Patient denies foot ulcerations, hypoglycemia , nausea, polydipsia, polyuria and vomiting. Evaluation to date has included: fasting blood sugar, fasting lipid panel, hemoglobin A1C and microalbuminuria.  Home sugars: BGs are running  consistent with Hgb A1C. Current treatment: Continued sulfonylurea which has been not very effective, Continued metformin which has been not very effective, Continued statin which has been somewhat effective and Continued janvua which has been somewhat effective. Last dilated eye exam: requested information.   Neck Pain Patient complains of neck pain. Event that precipitated these symptoms: none known. Onset of symptoms several months months ago, and have been gradually worsening since that time. Current symptoms are numbness in right arm and hand, paresthesias in arm and hand and weakness in hand. Patient denies stiffness. Patient has had recurrent self limited episodes of neck pain in the past. Previous treatments include: physical therapy. Past Medical History:  Diagnosis Date  . Allergy    . Diabetes mellitus   . GERD (gastroesophageal reflux disease)   . Hyperlipidemia   . Hypertension   . Hyperthyroidism     Past Surgical History:  Procedure Laterality Date  . ANTERIOR CERVICAL DECOMP/DISCECTOMY FUSION N/A 10/11/2016   Procedure: ANTERIOR CERVICAL DISCECTOMY FUSION C6-7 WITH EXCISION OF OSTEOPHYTE;  Surgeon: [name], MD;  Location: [facility];  Service: Orthopedics;  Laterality: N/A;  . CESAREAN SECTION    . TUBAL LIGATION      Family History  Problem Relation Age of Onset  . Colon cancer Neg Hx   . Colon polyps Neg Hx   . Esophageal cancer Neg Hx   . Rectal cancer Neg Hx   . Stomach cancer Neg Hx     Social History   Socioeconomic History  . Marital status: Married    Spouse name: Not on file  . Number of children: Not on file  . Years of education: Not on file  . Highest education level: Not on file  Occupational History  . Not on file  Tobacco Use  . Smoking status: Never Smoker  . Smokeless tobacco: Never Used  Substance and Sexual Activity  . Alcohol use: No  . Drug use: No  . Sexual activity: Not on file  Other Topics Concern  . Not on file  Social History Narrative  . Not on file   Social Determinants of Health   Financial Resource Strain: Not on file  Food Insecurity: Not on file  Transportation Needs: Not on file  Physical Activity: Not on file  Stress: Not on file  Social Connections: Not on file  Intimate Partner Violence: Not on file    Outpatient  Medications Prior to Visit  Medication Sig Dispense Refill  . acetaminophen (TYLENOL) 500 MG tablet Take 1,000 mg by mouth daily as needed for moderate pain.     . blood glucose meter kit and supplies One Touch Verio. Use up to four times daily as directed. (FOR ICD-10 E10.9, E11.9). 1 each 0  . Cholecalciferol (VITAMIN D3) 125 MCG (5000 UT) CAPS Take 1 capsule (5,000 Units total) by mouth daily with breakfast. Take along with calcium and magnesium. 30 capsule 5  . glucose blood  test strip One touch verio 100 each 12  . Lancets (ONETOUCH ULTRASOFT) lancets Use as instructed 100 each 12  . loratadine (CLARITIN) 10 MG tablet Take 1 tablet (10 mg total) by mouth daily. 30 tablet 11  . pyrithione zinc (SM DANDRUFF SHAMPOO) 1 % shampoo Apply topically daily as needed for itching. 400 mL 12  . Specialty Vitamins Products (VITAMINS FOR THE HAIR) TABS Take 1 tablet by mouth daily. 90 tablet 3  . amLODipine (NORVASC) 10 MG tablet Take 1 tablet (10 mg total) by mouth daily. 90 tablet 3  . atenolol (TENORMIN) 25 MG tablet Take 1 tablet (25 mg total) by mouth daily. 30 tablet 0  . fluticasone (FLONASE) 50 MCG/ACT nasal spray Place 2 sprays into both nostrils daily. 16 g 6  . glimepiride (AMARYL) 4 MG tablet Take 2 tablets (8 mg total) by mouth daily before breakfast. 180 tablet 3  . JANUVIA 100 MG tablet Take 1 tablet (100 mg total) by mouth daily. 90 tablet 3  . metFORMIN (GLUCOPHAGE) 500 MG tablet Take 2 tablets (1,000 mg total) by mouth 2 (two) times daily with a meal. 360 tablet 3  . methimazole (TAPAZOLE) 10 MG tablet Take 1 tablet (10 mg total) by mouth daily. 30 tablet 6  . pantoprazole (PROTONIX) 40 MG tablet Take 1 tablet (40 mg total) by mouth daily. 90 tablet 3  . simvastatin (ZOCOR) 20 MG tablet Take 1 tablet (20 mg total) by mouth every evening. 90 tablet 3   Facility-Administered Medications Prior to Visit  Medication Dose Route Frequency Provider Last Rate Last Admin  . MEDERMA gel   Topical Once [name], MD        Allergies  Allergen Reactions  . Aspirin Nausea And Vomiting  . Tramadol     Upset stomach     ROS Review of Systems  Constitutional: Negative.   HENT: Negative.        Ear itch  Respiratory: Negative for chest tightness and shortness of breath.   Cardiovascular: Negative for chest pain.  Gastrointestinal: Negative for nausea and vomiting.  Endocrine: Negative.   Genitourinary: Negative.   Allergic/Immunologic: Negative.    Neurological: Negative.   Hematological: Negative.   Psychiatric/Behavioral: Negative.       Objective:    Physical Exam Constitutional:      General: She is not in acute distress.    Appearance: She is obese. She is not toxic-appearing.  HENT:     Head: Normocephalic and atraumatic.     Right Ear: Tympanic membrane normal.     Left Ear: There is impacted cerumen.  Cardiovascular:     Rate and Rhythm: Normal rate and regular rhythm.     Pulses: Normal pulses.     Heart sounds: Normal heart sounds.  Pulmonary:     Effort: Pulmonary effort is normal.     Breath sounds: Normal breath sounds.  Abdominal:     Palpations: Abdomen is soft.  Musculoskeletal:     Cervical back: No rigidity or tenderness.     Right lower leg: No edema.     Left lower leg: No edema.  Skin:    General: Skin is warm and dry.  Neurological:     Mental Status: She is alert. Mental status is at baseline. She is disoriented.  Psychiatric:        Mood and Affect: Mood normal.        Behavior: Behavior normal.        Thought Content: Thought content normal.     BP (!) 155/75   Pulse 99   Ht $R'5\' 4"'Ed$  (1.626 m)   Wt 161 lb (73 kg)   LMP 06/02/2013   SpO2 99%   BMI 27.64 kg/m  Wt Readings from Last 3 Encounters:  12/07/20 161 lb (73 kg)  08/11/20 164 lb (74.4 kg)  06/22/20 156 lb 12.8 oz (71.1 kg)     Health Maintenance Due  Topic Date Due  . OPHTHALMOLOGY EXAM  Never done    There are no preventive care reminders to display for this patient.  Lab Results  Component Value Date   TSH <0.005 (L) 05/12/2020   Lab Results  Component Value Date   WBC 7.4 05/12/2020   HGB 13.1 05/12/2020   HCT 42.3 05/12/2020   MCV 75 (L) 05/12/2020   PLT 346 05/12/2020   Lab Results  Component Value Date   NA 143 08/11/2020   K 4.7 08/11/2020   CO2 24 12/15/2019   GLUCOSE 205 (H) 08/11/2020   BUN 12 08/11/2020   CREATININE 0.42 (L) 08/11/2020   BILITOT 0.5 08/11/2020   ALKPHOS 108 08/11/2020    AST 20 08/11/2020   ALT 30 12/15/2019   PROT 6.8 08/11/2020   ALBUMIN 4.1 08/11/2020   CALCIUM 9.6 08/11/2020   ANIONGAP 10 11/08/2016   GFR 204.34 12/15/2019   Lab Results  Component Value Date   CHOL 193 08/11/2020   Lab Results  Component Value Date   HDL 43 08/11/2020   Lab Results  Component Value Date   LDLCALC 130 (H) 08/11/2020   Lab Results  Component Value Date   TRIG 112 08/11/2020   Lab Results  Component Value Date   CHOLHDL 4.5 (H) 08/11/2020   Lab Results  Component Value Date   HGBA1C 9.7 (A) 12/07/2020      Assessment & Plan:   Problem List Items Addressed This Visit      Endocrine   Diabetes mellitus ([place]) - Primary Uncontrolled.  Encourage compliance with current treatment regimen   Encourage regular CBG monitoring Encourage contacting office if excessive hyperglycemia and or hypoglycemia Lifestyle modification with healthy diet (fewer calories, more high fiber foods, whole grains and non-starchy vegetables, lower fat meat and fish, low-fat diary include healthy oils) regular exercise (physical activity) and weight loss Opthalmology exam discussed  Nutritional consult recommended/. She is aware of the changes that she needs to make in her diet and has promised to make the changes and will return with a better controlled A1c. .  Regular dental visits encouraged Home BP monitoring also encouraged goal <130/80     Relevant Medications   glimepiride (AMARYL) 4 MG tablet   JANUVIA 100 MG tablet   metFORMIN (GLUCOPHAGE) 500 MG tablet   simvastatin (ZOCOR) 20 MG tablet   Other Relevant Orders   POCT glycosylated hemoglobin (Hb A1C) (Completed)   Comp. Metabolic Panel (12)    Other Visit Diagnoses  Neck pain     worsening   Relevant Orders   Ambulatory referral to Physical Therapy   Essential hypertension     Stable on current regimen   Relevant Medications   amLODipine (NORVASC) 10 MG tablet   atenolol (TENORMIN) 25 MG tablet    simvastatin (ZOCOR) 20 MG tablet   Muscle weakness of right arm     Worsening refill in place    Relevant Orders   Ambulatory referral to Physical Therapy      Meds ordered this encounter  Medications  . carbamide peroxide (DEBROX) 6.5 % OTIC solution    Sig: Place 5 drops into the left ear 2 (two) times daily.    Dispense:  15 mL    Refill:  0    Order Specific Question:   Supervising Provider    Answer:   Quentin Angst [id number]  . amLODipine (NORVASC) 10 MG tablet    Sig: Take 1 tablet (10 mg total) by mouth daily.    Dispense:  90 tablet    Refill:  3    Order Specific Question:   Supervising Provider    Answer:   Quentin Angst [id number]  . atenolol (TENORMIN) 25 MG tablet    Sig: Take 1 tablet (25 mg total) by mouth daily.    Dispense:  30 tablet    Refill:  0    Order Specific Question:   Supervising Provider    Answer:   Quentin Angst [id number]  . fluticasone (FLONASE) 50 MCG/ACT nasal spray    Sig: Place 2 sprays into both nostrils daily.    Dispense:  16 g    Refill:  6    Order Specific Question:   Supervising Provider    Answer:   Quentin Angst [id number]  . glimepiride (AMARYL) 4 MG tablet    Sig: Take 2 tablets (8 mg total) by mouth daily before breakfast.    Dispense:  180 tablet    Refill:  3    Order Specific Question:   Supervising Provider    Answer:   Quentin Angst [id number]  . JANUVIA 100 MG tablet    Sig: Take 1 tablet (100 mg total) by mouth daily.    Dispense:  90 tablet    Refill:  3    Order Specific Question:   Supervising Provider    Answer:   Quentin Angst [id number]  . metFORMIN (GLUCOPHAGE) 500 MG tablet    Sig: Take 2 tablets (1,000 mg total) by mouth 2 (two) times daily with a meal.    Dispense:  360 tablet    Refill:  3    Order Specific Question:   Supervising Provider    Answer:   Quentin Angst [id number]  . methimazole (TAPAZOLE) 10 MG tablet    Sig: Take 1 tablet (10 mg total) by  mouth daily.    Dispense:  30 tablet    Refill:  6    Order Specific Question:   Supervising Provider    Answer:   Quentin Angst [id number]  . pantoprazole (PROTONIX) 40 MG tablet    Sig: Take 1 tablet (40 mg total) by mouth daily.    Dispense:  90 tablet    Refill:  3    Order Specific Question:   Supervising Provider    Answer:   Quentin Angst [id number]  . simvastatin (ZOCOR) 20 MG tablet    Sig: Take 1 tablet (20 mg total) by  mouth every evening.    Dispense:  90 tablet    Refill:  3    Order Specific Question:   Supervising Provider    Answer:   Tresa Garter [id number]    Follow-up: Return in about 3 months (around 03/09/2021) for follow up [id number].    Crystal Francois, NP

## 2020-12-07 NOTE — Patient Instructions (Addendum)
Diabetes Mellitus and Nutrition, Adult When you have diabetes, or diabetes mellitus, it is very important to have healthy eating habits because your blood sugar (glucose) levels are greatly affected by what you eat and drink. Eating healthy foods in the right amounts, at about the same times every day, can help you:  Control your blood glucose.  Lower your risk of heart disease.  Improve your blood pressure.  Reach or maintain a healthy weight. What can affect my meal plan? Every person with diabetes is different, and each person has different needs for a meal plan. Your health care provider may recommend that you work with a dietitian to make a meal plan that is best for you. Your meal plan may vary depending on factors such as:  The calories you need.  The medicines you take.  Your weight.  Your blood glucose, blood pressure, and cholesterol levels.  Your activity level.  Other health conditions you have, such as heart or kidney disease. How do carbohydrates affect me? Carbohydrates, also called carbs, affect your blood glucose level more than any other type of food. Eating carbs naturally raises the amount of glucose in your blood. Carb counting is a method for keeping track of how many carbs you eat. Counting carbs is important to keep your blood glucose at a healthy level, especially if you use insulin or take certain oral diabetes medicines. It is important to know how many carbs you can safely have in each meal. This is different for every person. Your dietitian can help you calculate how many carbs you should have at each meal and for each snack. How does alcohol affect me? Alcohol can cause a sudden decrease in blood glucose (hypoglycemia), especially if you use insulin or take certain oral diabetes medicines. Hypoglycemia can be a life-threatening condition. Symptoms of hypoglycemia, such as sleepiness, dizziness, and confusion, are similar to symptoms of having too much  alcohol.  Do not drink alcohol if: ? Your health care provider tells you not to drink. ? You are pregnant, may be pregnant, or are planning to become pregnant.  If you drink alcohol: ? Do not drink on an empty stomach. ? Limit how much you use to:  0-1 drink a day for women.  0-2 drinks a day for men. ? Be aware of how much alcohol is in your drink. In the [place] one drink equals one 12 oz bottle of beer (355 mL), one 5 oz glass of wine (148 mL), or one 1 oz glass of hard liquor (44 mL). ? Keep yourself hydrated with water, diet soda, or unsweetened iced tea.  Keep in mind that regular soda, juice, and other mixers may contain a lot of sugar and must be counted as carbs. What are tips for following this plan? Reading food labels  Start by checking the serving size on the "Nutrition Facts" label of packaged foods and drinks. The amount of calories, carbs, fats, and other nutrients listed on the label is based on one serving of the item. Many items contain more than one serving per package.  Check the total grams (g) of carbs in one serving. You can calculate the number of servings of carbs in one serving by dividing the total carbs by 15. For example, if a food has 30 g of total carbs per serving, it would be equal to 2 servings of carbs.  Check the number of grams (g) of saturated fats and trans fats in one serving. Choose foods that have   a low amount or none of these fats.  Check the number of milligrams (mg) of salt (sodium) in one serving. Most people should limit total sodium intake to less than 2,300 mg per day.  Always check the nutrition information of foods labeled as "low-fat" or "nonfat." These foods may be higher in added sugar or refined carbs and should be avoided.  Talk to your dietitian to identify your daily goals for nutrients listed on the label. Shopping  Avoid buying canned, pre-made, or processed foods. These foods tend to be high in fat, sodium, and added  sugar.  Shop around the outside edge of the grocery store. This is where you will most often find fresh fruits and vegetables, bulk grains, fresh meats, and fresh dairy. Cooking  Use low-heat cooking methods, such as baking, instead of high-heat cooking methods like deep frying.  Cook using healthy oils, such as olive, canola, or sunflower oil.  Avoid cooking with butter, cream, or high-fat meats. Meal planning  Eat meals and snacks regularly, preferably at the same times every day. Avoid going long periods of time without eating.  Eat foods that are high in fiber, such as fresh fruits, vegetables, beans, and whole grains. Talk with your dietitian about how many servings of carbs you can eat at each meal.  Eat 4-6 oz (112-168 g) of lean protein each day, such as lean meat, chicken, fish, eggs, or tofu. One ounce (oz) of lean protein is equal to: ? 1 oz (28 g) of meat, chicken, or fish. ? 1 egg. ?  cup (62 g) of tofu.  Eat some foods each day that contain healthy fats, such as avocado, nuts, seeds, and fish.   What foods should I eat? Fruits Berries. Apples. Oranges. Peaches. Apricots. Plums. Grapes. Mango. Papaya. Pomegranate. Kiwi. Cherries. Vegetables Lettuce. Spinach. Leafy greens, including kale, chard, collard greens, and mustard greens. Beets. Cauliflower. Cabbage. Broccoli. Carrots. Green beans. Tomatoes. Peppers. Onions. Cucumbers. Brussels sprouts. Grains Whole grains, such as whole-wheat or whole-grain bread, crackers, tortillas, cereal, and pasta. Unsweetened oatmeal. Quinoa. Brown or wild rice. Meats and other proteins Seafood. Poultry without skin. Lean cuts of poultry and beef. Tofu. Nuts. Seeds. Dairy Low-fat or fat-free dairy products such as milk, yogurt, and cheese. The items listed above may not be a complete list of foods and beverages you can eat. Contact a dietitian for more information.  What foods should I avoid? Fruits Fruits canned with  syrup. Vegetables Canned vegetables. Frozen vegetables with butter or cream sauce. Grains Refined white flour and flour products such as bread, pasta, snack foods, and cereals. Avoid all processed foods. Meats and other proteins Fatty cuts of meat. Poultry with skin. Breaded or fried meats. Processed meat. Avoid saturated fats. Dairy Full-fat yogurt, cheese, or milk. Beverages Sweetened drinks, such as soda or iced tea. The items listed above may not be a complete list of foods and beverages you should avoid. Contact a dietitian for more information. Questions to ask a health care provider  Do I need to meet with a diabetes educator?  Do I need to meet with a dietitian?  What number can I call if I have questions?  When are the best times to check my blood glucose? Where to find more information:  American Diabetes Association: diabetes.org  Academy of Nutrition and Dietetics: www.eatright.[employer] of Diabetes and Digestive and Kidney Diseases: [employer]  Association of Diabetes Care and Education Specialists: www.diabeteseducator.org Summary  It is important to have healthy  eating habits because your blood sugar (glucose) levels are greatly affected by what you eat and drink.  A healthy meal plan will help you control your blood glucose and maintain a healthy lifestyle.  Your health care provider may recommend that you work with a dietitian to make a meal plan that is best for you.  Keep in mind that carbohydrates (carbs) and alcohol have immediate effects on your blood glucose levels. It is important to count carbs and to use alcohol carefully. This information is not intended to replace advice given to you by your health care provider. Make sure you discuss any questions you have with your health care provider. Document Revised: 08/31/2019 Document Reviewed: 08/31/2019 Elsevier Patient Education  2021 [place] [facility] Address: [address] [place], [place] [address] Phone: [phone]   [facility] [address] [place], [place]  [address] Phone:  [phone]   Fax:  [phone]  [facility]

## 2020-12-08 LAB — COMP. METABOLIC PANEL (12)
AST: 19 IU/L (ref 0–40)
Albumin/Globulin Ratio: 1.3 (ref 1.2–2.2)
Albumin: 4.1 g/dL (ref 3.8–4.9)
Alkaline Phosphatase: 106 IU/L (ref 44–121)
BUN/Creatinine Ratio: 23 (ref 9–23)
BUN: 10 mg/dL (ref 6–24)
Bilirubin Total: 0.4 mg/dL (ref 0.0–1.2)
Calcium: 9.8 mg/dL (ref 8.7–10.2)
Chloride: 101 mmol/L (ref 96–106)
Creatinine, Ser: 0.44 mg/dL — ABNORMAL LOW (ref 0.57–1.00)
Globulin, Total: 3.1 g/dL (ref 1.5–4.5)
Glucose: 194 mg/dL — ABNORMAL HIGH (ref 65–99)
Potassium: 4.6 mmol/L (ref 3.5–5.2)
Sodium: 142 mmol/L (ref 134–144)
Total Protein: 7.2 g/dL (ref 6.0–8.5)
eGFR: 112 mL/min/{1.73_m2} (ref >59)

## 2021-03-09 ENCOUNTER — Ambulatory Visit: Payer: 59 | Admitting: Nurse Practitioner

## 2021-05-04 ENCOUNTER — Ambulatory Visit (INDEPENDENT_AMBULATORY_CARE_PROVIDER_SITE_OTHER): Payer: 59 | Admitting: Nurse Practitioner

## 2021-05-04 ENCOUNTER — Other Ambulatory Visit (HOSPITAL_COMMUNITY): Payer: Self-pay

## 2021-05-04 ENCOUNTER — Encounter: Payer: Self-pay | Admitting: Nurse Practitioner

## 2021-05-04 ENCOUNTER — Other Ambulatory Visit: Payer: Self-pay

## 2021-05-04 VITALS — BP 129/56 | HR 86 | Temp 97.7°F | Ht 64.0 in | Wt 156.0 lb

## 2021-05-04 DIAGNOSIS — E119 Type 2 diabetes mellitus without complications: Secondary | ICD-10-CM | POA: Diagnosis not present

## 2021-05-04 DIAGNOSIS — E0590 Thyrotoxicosis, unspecified without thyrotoxic crisis or storm: Secondary | ICD-10-CM

## 2021-05-04 DIAGNOSIS — E785 Hyperlipidemia, unspecified: Secondary | ICD-10-CM

## 2021-05-04 DIAGNOSIS — M25531 Pain in right wrist: Secondary | ICD-10-CM

## 2021-05-04 DIAGNOSIS — I10 Essential (primary) hypertension: Secondary | ICD-10-CM | POA: Diagnosis not present

## 2021-05-04 DIAGNOSIS — G8929 Other chronic pain: Secondary | ICD-10-CM

## 2021-05-04 LAB — POCT GLYCOSYLATED HEMOGLOBIN (HGB A1C)
HbA1c POC (<> result, manual entry): 9 % (ref 4.0–5.6)
HbA1c, POC (controlled diabetic range): 9 % — AB (ref 0.0–7.0)
HbA1c, POC (prediabetic range): 9 % — AB (ref 5.7–6.4)
Hemoglobin A1C: 9 % — AB (ref 4.0–5.6)

## 2021-05-04 LAB — POCT URINALYSIS DIPSTICK
Bilirubin, UA: NEGATIVE
Blood, UA: NEGATIVE
Glucose, UA: NEGATIVE
Leukocytes, UA: NEGATIVE
Nitrite, UA: NEGATIVE
Protein, UA: NEGATIVE
Spec Grav, UA: 1.025 (ref 1.010–1.025)
Urobilinogen, UA: 0.2 E.U./dL
pH, UA: 5.5 (ref 5.0–8.0)

## 2021-05-04 MED ORDER — METHIMAZOLE 10 MG PO TABS
10.0000 mg | ORAL_TABLET | Freq: Every day | ORAL | 3 refills | Status: DC
  Filled 2021-05-04: qty 90, 90d supply, fill #0

## 2021-05-04 MED ORDER — ATENOLOL 25 MG PO TABS
25.0000 mg | ORAL_TABLET | Freq: Every day | ORAL | 0 refills | Status: DC
  Filled 2021-05-04: qty 30, 30d supply, fill #0

## 2021-05-04 MED FILL — Sitagliptin Phosphate Tab 100 MG (Base Equiv): ORAL | 90 days supply | Qty: 90 | Fill #0 | Status: CN

## 2021-05-04 MED FILL — Metformin HCl Tab 500 MG: ORAL | 90 days supply | Qty: 360 | Fill #0 | Status: AC

## 2021-05-04 MED FILL — Sitagliptin Phosphate Tab 100 MG (Base Equiv): ORAL | 30 days supply | Qty: 30 | Fill #0 | Status: AC

## 2021-05-04 MED FILL — Amlodipine Besylate Tab 10 MG (Base Equivalent): ORAL | 90 days supply | Qty: 90 | Fill #0 | Status: CN

## 2021-05-04 NOTE — Progress Notes (Signed)
[place], [place]  [address] Phone:  [phone]   Fax:  [phone]   Established Patient Office Visit  Subjective:  Patient ID: Crystal [name], female    DOB: 12/20/1960  Age: 60 y.o. MRN: [id number]  CC:  Chief Complaint  Patient presents with   Follow-up    3 month follow up    HPI Crystal [name] presents for follow up. She  has a past medical history of Allergy, Diabetes mellitus, GERD (gastroesophageal reflux disease), Hyperlipidemia, Hypertension, and Hyperthyroidism.   She reports being that she was in [place]. She was out of her medication for a few weeks. She reports doing a herbal drinks. She reports that she is losing weight however is eating.  She has a history of hypothyroidism.  She admitted today that she has not taken her methimazole in a "long time".  When asked she admits that her heart rate does occasionally increase.  Past Medical History:  Diagnosis Date   Allergy    Diabetes mellitus    GERD (gastroesophageal reflux disease)    Hyperlipidemia    Hypertension    Hyperthyroidism     Past Surgical History:  Procedure Laterality Date   ANTERIOR CERVICAL DECOMP/DISCECTOMY FUSION N/A 10/11/2016   Procedure: ANTERIOR CERVICAL DISCECTOMY FUSION C6-7 WITH EXCISION OF OSTEOPHYTE;  Surgeon: [name], MD;  Location: [facility];  Service: Orthopedics;  Laterality: N/A;   CESAREAN SECTION     TUBAL LIGATION      Family History  Problem Relation Age of Onset   Colon cancer Neg Hx    Colon polyps Neg Hx    Esophageal cancer Neg Hx    Rectal cancer Neg Hx    Stomach cancer Neg Hx     Social History   Socioeconomic History   Marital status: Married    Spouse name: Not on file   Number of children: Not on file   Years of education: Not on file   Highest education level: Not on file  Occupational History   Not on file  Tobacco Use   Smoking status: Never   Smokeless tobacco: Never  Substance and  Sexual Activity   Alcohol use: No   Drug use: No   Sexual activity: Not on file  Other Topics Concern   Not on file  Social History Narrative   Not on file   Social Determinants of Health   Financial Resource Strain: Not on file  Food Insecurity: Not on file  Transportation Needs: Not on file  Physical Activity: Not on file  Stress: Not on file  Social Connections: Not on file  Intimate Partner Violence: Not on file    Outpatient Medications Prior to Visit  Medication Sig Dispense Refill   acetaminophen (TYLENOL) 500 MG tablet Take 1,000 mg by mouth daily as needed for moderate pain.      amLODipine (NORVASC) 10 MG tablet TAKE 1 TABLET BY MOUTH ONCE A DAY 90 tablet 3   blood glucose meter kit and supplies One Touch Verio. Use up to four times daily as directed. (FOR ICD-10 E10.9, E11.9). 1 each 0   carbamide peroxide (DEBROX) 6.5 % OTIC solution PLACE 5 DROPS INTO THE LEFT EAR 2 (TWO) TIMES DAILY. 15 mL 0   doxycycline (VIBRA-TABS) 100 MG tablet TAKE 1 CAPSULE BY MOUTH TWICE A DAY WITH MEALS 60 tablet 2   Fluocinolone Acetonide Scalp 0.01 % OIL APPLY 2 TIMES DAILY TO SCALP  FOR 2 WEEKS ON AND 1 WEEK OFF REPEAT 118.28 mL 2   glimepiride (AMARYL) 4 MG tablet TAKE 2 TABLETS BY MOUTH ONCE A DAY BEFORE BREAKFAST 180 tablet 3   JANUVIA 100 MG tablet TAKE 1 TABLET BY MOUTH ONCE A DAY 90 tablet 3   ketoconazole (NIZORAL) 2 % shampoo USE 1 TIME A WEEK LATHER ON SCALP, LEAVE ON 3-5 MINUTES, RINSE WELL 120 mL 5   Lancets (ONETOUCH ULTRASOFT) lancets Use as instructed 100 each 12   loratadine (CLARITIN) 10 MG tablet Take 1 tablet (10 mg total) by mouth daily. 30 tablet 11   metFORMIN (GLUCOPHAGE) 500 MG tablet TAKE 2 TABLETS BY MOUTH 2 TIMES DAILY WITH A MEAL 360 tablet 3   pantoprazole (PROTONIX) 40 MG tablet TAKE 1 TABLET BY MOUTH ONCE A DAY 90 tablet 3   pyrithione zinc (SM DANDRUFF SHAMPOO) 1 % shampoo Apply topically daily as needed for itching. 400 mL 12   simvastatin (ZOCOR) 20 MG  tablet TAKE 1 TABLET BY MOUTH EVERY EVENING 90 tablet 3   Specialty Vitamins Products (VITAMINS FOR THE HAIR) TABS Take 1 tablet by mouth daily. 90 tablet 3   atenolol (TENORMIN) 25 MG tablet Take 1 tablet (25 mg total) by mouth daily. 30 tablet 0   methimazole (TAPAZOLE) 10 MG tablet TAKE 1 TABLET BY MOUTH ONCE A DAY 30 tablet 6   amLODipine (NORVASC) 10 MG tablet Take 1 tablet (10 mg total) by mouth daily. 90 tablet 3   atenolol (TENORMIN) 25 MG tablet TAKE 1 TABLET BY MOUTH ONCE A DAY 30 tablet 0   carbamide peroxide (DEBROX) 6.5 % OTIC solution Place 5 drops into the left ear 2 (two) times daily. 15 mL 0   fluticasone (FLONASE) 50 MCG/ACT nasal spray Place 2 sprays into both nostrils daily. 16 g 6   fluticasone (FLONASE) 50 MCG/ACT nasal spray PLACE 2 SPRAYS INTO BOTH NOSTRILS DAILY. 16 g 6   glimepiride (AMARYL) 4 MG tablet Take 2 tablets (8 mg total) by mouth daily before breakfast. 180 tablet 3   JANUVIA 100 MG tablet Take 1 tablet (100 mg total) by mouth daily. 90 tablet 3   metFORMIN (GLUCOPHAGE) 500 MG tablet Take 2 tablets (1,000 mg total) by mouth 2 (two) times daily with a meal. 360 tablet 3   methimazole (TAPAZOLE) 10 MG tablet Take 1 tablet (10 mg total) by mouth daily. 30 tablet 6   pantoprazole (PROTONIX) 40 MG tablet Take 1 tablet (40 mg total) by mouth daily. 90 tablet 3   simvastatin (ZOCOR) 20 MG tablet Take 1 tablet (20 mg total) by mouth every evening. 90 tablet 3   Facility-Administered Medications Prior to Visit  Medication Dose Route Frequency Provider Last Rate Last Admin   MEDERMA gel   Topical Once [name], MD        Allergies  Allergen Reactions   Aspirin Nausea And Vomiting   Tramadol     Upset stomach     ROS Review of Systems    Objective:    Physical Exam HENT:     Head: Normocephalic.     Nose: Nose normal.     Mouth/Throat:     Mouth: Mucous membranes are moist.  Cardiovascular:     Rate and Rhythm: Normal rate and regular rhythm.      Pulses: Normal pulses.     Heart sounds: Normal heart sounds.  Pulmonary:     Effort: Pulmonary effort is normal.  Breath sounds: Normal breath sounds.  Abdominal:     General: Bowel sounds are normal.     Palpations: Abdomen is soft.  Musculoskeletal:        General: Normal range of motion.     Cervical back: Normal range of motion.     Right lower leg: No edema.     Left lower leg: No edema.  Skin:    General: Skin is warm and dry.     Capillary Refill: Capillary refill takes less than 2 seconds.  Neurological:     General: No focal deficit present.     Mental Status: She is alert and oriented to person, place, and time.  Psychiatric:        Mood and Affect: Mood normal.        Behavior: Behavior normal.        Thought Content: Thought content normal.        Judgment: Judgment normal.   BP (!) 129/56   Pulse 86   Temp 97.7 F (36.5 C)   Ht _0  (1.626 m)   Wt 156 lb (70.8 kg)   LMP 06/02/2013   BMI 26.78 kg/m  Wt Readings from Last 3 Encounters:  05/04/21 156 lb (70.8 kg)  12/07/20 161 lb (73 kg)  08/11/20 164 lb (74.4 kg)     Health Maintenance Due  Topic Date Due   OPHTHALMOLOGY EXAM  Never done   PAP SMEAR-Modifier  10/10/2008   FOOT EXAM  02/10/2021    There are no preventive care reminders to display for this patient.  Lab Results  Component Value Date   TSH <0.005 (L) 05/12/2020   Lab Results  Component Value Date   WBC 7.4 05/12/2020   HGB 13.1 05/12/2020   HCT 42.3 05/12/2020   MCV 75 (L) 05/12/2020   PLT 346 05/12/2020   Lab Results  Component Value Date   NA 142 12/07/2020   K 4.6 12/07/2020   CO2 24 12/15/2019   GLUCOSE 194 (H) 12/07/2020   BUN 10 12/07/2020   CREATININE 0.44 (L) 12/07/2020   BILITOT 0.4 12/07/2020   ALKPHOS 106 12/07/2020   AST 19 12/07/2020   ALT 30 12/15/2019   PROT 7.2 12/07/2020   ALBUMIN 4.1 12/07/2020   CALCIUM 9.8 12/07/2020   ANIONGAP 10 11/08/2016   EGFR 112 12/07/2020   GFR 204.34 12/15/2019    Lab Results  Component Value Date   CHOL 193 08/11/2020   Lab Results  Component Value Date   HDL 43 08/11/2020   Lab Results  Component Value Date   LDLCALC 130 (H) 08/11/2020   Lab Results  Component Value Date   TRIG 112 08/11/2020   Lab Results  Component Value Date   CHOLHDL 4.5 (H) 08/11/2020   Lab Results  Component Value Date   HGBA1C 9.0 (A) 05/04/2021   HGBA1C 9.0 05/04/2021   HGBA1C 9.0 (A) 05/04/2021   HGBA1C 9.0 (A) 05/04/2021      Assessment & Plan:   Problem List Items Addressed This Visit       Endocrine   Diabetes mellitus ([place]) - Primary Uncontrolled however improving A1c 9.0 down from 9.4% Encourage compliance with current treatment regimen  Encourage regular CBG monitoring Encourage contacting office if excessive hyperglycemia and or hypoglycemia Lifestyle modification with healthy diet (fewer calories, more high fiber foods, whole grains and non-starchy vegetables, lower fat meat and fish, low-fat diary include healthy oils) regular exercise (physical activity) and weight loss Opthalmology exam  discussed  Nutritional consult recommended Regular dental visits encouraged Home BP monitoring also encouraged goal <130/80    Relevant Orders   Urinalysis Dipstick (Completed)   HgB A1c (Completed)   Comp. Metabolic Panel (12)   Hyperthyroidism Labs pending Encourage patient to take her medication as directed.  Discussed hyperthyroidism   Relevant Medications   methimazole (TAPAZOLE) 10 MG tablet   atenolol (TENORMIN) 25 MG tablet   Other Relevant Orders   TSH   T3   T4, free     Other   Dyslipidemia   Relevant Orders   Lipid panel   Other Visit Diagnoses     Essential hypertension   Stable Encouraged on going compliance with current medication regimen Encouraged home monitoring and recording BP <130/80 Eating a heart-healthy diet with less salt Encouraged regular physical activity     Relevant Medications   atenolol  (TENORMIN) 25 MG tablet   Other Relevant Orders   Urinalysis Dipstick (Completed)   Wrist pain, chronic, right     Persistent   Relevant Orders   Ambulatory referral to Physical Therapy       Meds ordered this encounter  Medications   methimazole (TAPAZOLE) 10 MG tablet    Sig: Take 1 tablet (10 mg total) by mouth daily.    Dispense:  90 tablet    Refill:  3    Order Specific Question:   Supervising Provider    Answer:   Tresa Garter [[id number]]   atenolol (TENORMIN) 25 MG tablet    Sig: Take 1 tablet (25 mg total) by mouth daily.    Dispense:  30 tablet    Refill:  0    Order Specific Question:   Supervising Provider    Answer:   Tresa Garter [id number]     Follow-up: No follow-ups on file.    Vevelyn Francois, NP

## 2021-05-04 NOTE — Patient Instructions (Addendum)
Physical therapy Address: [address], [place], [place] [address] Phone: [phone]  Diabetes Mellitus and Nutrition, Adult When you have diabetes, or diabetes mellitus, it is very important to have healthy eating habits because your blood sugar (glucose) levels are greatly affected by what you eat and drink. Eating healthy foods in the right amounts, at about the same times every day, can help you: Control your blood glucose. Lower your risk of heart disease. Improve your blood pressure. Reach or maintain a healthy weight. What can affect my meal plan? Every person with diabetes is different, and each person has different needs for a meal plan. Your health care provider may recommend that you work with a dietitian to make a meal plan that is best for you. Your meal plan may vary depending on factors such as: The calories you need. The medicines you take. Your weight. Your blood glucose, blood pressure, and cholesterol levels. Your activity level. Other health conditions you have, such as heart or kidney disease. How do carbohydrates affect me? Carbohydrates, also called carbs, affect your blood glucose level more than any other type of food. Eating carbs naturally raises the amount of glucose in your blood. Carb counting is a method for keeping track of how many carbs you eat. Counting carbs is important to keep your blood glucose at a healthy level,especially if you use insulin or take certain oral diabetes medicines. It is important to know how many carbs you can safely have in each meal. This is different for every person. Your dietitian can help you calculate how manycarbs you should have at each meal and for each snack. How does alcohol affect me? Alcohol can cause a sudden decrease in blood glucose (hypoglycemia), especially if you use insulin or take certain oral diabetes medicines. Hypoglycemia can be a life-threatening condition. Symptoms of hypoglycemia, such as sleepiness,  dizziness, and confusion, are similar to symptoms of having too much alcohol. Do not drink alcohol if: Your health care provider tells you not to drink. You are pregnant, may be pregnant, or are planning to become pregnant. If you drink alcohol: Do not drink on an empty stomach. Limit how much you use to: 0-1 drink a day for women. 0-2 drinks a day for men. Be aware of how much alcohol is in your drink. In the [place] one drink equals one 12 oz bottle of beer (355 mL), one 5 oz glass of wine (148 mL), or one 1 oz glass of hard liquor (44 mL). Keep yourself hydrated with water, diet soda, or unsweetened iced tea. Keep in mind that regular soda, juice, and other mixers may contain a lot of sugar and must be counted as carbs. What are tips for following this plan?  Reading food labels Start by checking the serving size on the "Nutrition Facts" label of packaged foods and drinks. The amount of calories, carbs, fats, and other nutrients listed on the label is based on one serving of the item. Many items contain more than one serving per package. Check the total grams (g) of carbs in one serving. You can calculate the number of servings of carbs in one serving by dividing the total carbs by 15. For example, if a food has 30 g of total carbs per serving, it would be equal to 2 servings of carbs. Check the number of grams (g) of saturated fats and trans fats in one serving. Choose foods that have a low amount or none of these fats. Check the number  of milligrams (mg) of salt (sodium) in one serving. Most people should limit total sodium intake to less than 2,300 mg per day. Always check the nutrition information of foods labeled as "low-fat" or "nonfat." These foods may be higher in added sugar or refined carbs and should be avoided. Talk to your dietitian to identify your daily goals for nutrients listed on the label. Shopping Avoid buying canned, pre-made, or processed foods. These foods tend to be  high in fat, sodium, and added sugar. Shop around the outside edge of the grocery store. This is where you will most often find fresh fruits and vegetables, bulk grains, fresh meats, and fresh dairy. Cooking Use low-heat cooking methods, such as baking, instead of high-heat cooking methods like deep frying. Cook using healthy oils, such as olive, canola, or sunflower oil. Avoid cooking with butter, cream, or high-fat meats. Meal planning Eat meals and snacks regularly, preferably at the same times every day. Avoid going long periods of time without eating. Eat foods that are high in fiber, such as fresh fruits, vegetables, beans, and whole grains. Talk with your dietitian about how many servings of carbs you can eat at each meal. Eat 4-6 oz (112-168 g) of lean protein each day, such as lean meat, chicken, fish, eggs, or tofu. One ounce (oz) of lean protein is equal to: 1 oz (28 g) of meat, chicken, or fish. 1 egg.  cup (62 g) of tofu. Eat some foods each day that contain healthy fats, such as avocado, nuts, seeds, and fish. What foods should I eat? Fruits Berries. Apples. Oranges. Peaches. Apricots. Plums. Grapes. Mango. Papaya.Pomegranate. Kiwi. Cherries. Vegetables Lettuce. Spinach. Leafy greens, including kale, chard, collard greens, and mustard greens. Beets. Cauliflower. Cabbage. Broccoli. Carrots. Green beans.Tomatoes. Peppers. Onions. Cucumbers. Brussels sprouts. Grains Whole grains, such as whole-wheat or whole-grain bread, crackers, tortillas,cereal, and pasta. Unsweetened oatmeal. Quinoa. Brown or wild rice. Meats and other proteins Seafood. Poultry without skin. Lean cuts of poultry and beef. Tofu. Nuts. Seeds. Dairy Low-fat or fat-free dairy products such as milk, yogurt, and cheese. The items listed above may not be a complete list of foods and beverages you can eat. Contact a dietitian for more information. What foods should I avoid? Fruits Fruits canned with  syrup. Vegetables Canned vegetables. Frozen vegetables with butter or cream sauce. Grains Refined white flour and flour products such as bread, pasta, snack foods, andcereals. Avoid all processed foods. Meats and other proteins Fatty cuts of meat. Poultry with skin. Breaded or fried meats. Processed meat.Avoid saturated fats. Dairy Full-fat yogurt, cheese, or milk. Beverages Sweetened drinks, such as soda or iced tea. The items listed above may not be a complete list of foods and beverages you should avoid. Contact a dietitian for more information. Questions to ask a health care provider Do I need to meet with a diabetes educator? Do I need to meet with a dietitian? What number can I call if I have questions? When are the best times to check my blood glucose? Where to find more information: American Diabetes Association: diabetes.org Academy of Nutrition and Dietetics: www.eatright.[employer] of Diabetes and Digestive and Kidney Diseases: [employer] Association of Diabetes Care and Education Specialists: www.diabeteseducator.org Summary It is important to have healthy eating habits because your blood sugar (glucose) levels are greatly affected by what you eat and drink. A healthy meal plan will help you control your blood glucose and maintain a healthy lifestyle. Your health care provider may recommend that you work with  a dietitian to make a meal plan that is best for you. Keep in mind that carbohydrates (carbs) and alcohol have immediate effects on your blood glucose levels. It is important to count carbs and to use alcohol carefully. This information is not intended to replace advice given to you by your health care provider. Make sure you discuss any questions you have with your healthcare provider. Document Revised: 08/31/2019 Document Reviewed: 08/31/2019 Elsevier Patient Education  2021 [employer].  Hyperthyroidism  Hyperthyroidism is when the thyroid  gland is too active (overactive). The thyroid gland is a small gland located in the lower front part of the neck, just in front of the windpipe (trachea). This gland makes hormones that help control how the body uses food for energy (metabolism) as well as how the heart and brain function. These hormones also play a role in keeping your bones strong. When the thyroid is overactive, it produces toomuch of a hormone called thyroxine. What are the causes? This condition may be caused by: Graves' disease. This is a disorder in which the body's disease-fighting system (immune system) attacks the thyroid gland. This is the most common cause. Inflammation of the thyroid gland. A tumor in the thyroid gland. Use of certain medicines, including: Prescription thyroid hormone replacement. Herbal supplements that mimic thyroid hormones. Amiodarone therapy. Solid or fluid-filled lumps within your thyroid gland (thyroid nodules). Taking in a large amount of iodine from foods or medicines. What increases the risk? You are more likely to develop this condition if: You are female. You have a family history of thyroid conditions. You smoke tobacco. You use a medicine called lithium. You take medicines that affect the immune system (immunosuppressants). What are the signs or symptoms? Symptoms of this condition include: Nervousness. Inability to tolerate heat. Unexplained weight loss. Diarrhea. Change in the texture of hair or skin. Heart skipping beats or making extra beats. Rapid heart rate. Loss of menstruation. Shaky hands. Fatigue. Restlessness. Sleep problems. Enlarged thyroid gland or a lump in the thyroid (nodule). You may also have symptoms of Graves' disease, which may include: Protruding eyes. Dry eyes. Red or swollen eyes. Problems with vision. How is this diagnosed? This condition may be diagnosed based on: Your symptoms and medical history. A physical exam. Blood tests. Thyroid  ultrasound. This test involves using sound waves to produce images of the thyroid gland. A thyroid scan. A radioactive substance is injected into a vein, and images show how much iodine is present in the thyroid. Radioactive iodine uptake test (RAIU). A small amount of radioactive iodine is given by mouth to see how much iodine the thyroid absorbs after a certain amount of time. How is this treated? Treatment depends on the cause and severity of the condition. Treatment may include: Medicines to reduce the amount of thyroid hormone your body makes. Radioactive iodine treatment (radioiodine therapy). This involves swallowing a small dose of radioactive iodine, in capsule or liquid form, to kill thyroid cells. Surgery to remove part or all of your thyroid gland. You may need to take thyroid hormone replacement medicine for the rest of your life after thyroid surgery. Medicines to help manage your symptoms. Follow these instructions at home:  Take over-the-counter and prescription medicines only as told by your health care provider. Do not use any products that contain nicotine or tobacco, such as cigarettes and e-cigarettes. If you need help quitting, ask your health care provider. Follow any instructions from your health care provider about diet. You may be instructed  to limit foods that contain iodine. Keep all follow-up visits as told by your health care provider. This is important. You will need to have blood tests regularly so that your health care provider can monitor your condition. Contact a health care provider if: Your symptoms do not get better with treatment. You have a fever. You are taking thyroid hormone replacement medicine and you: Have symptoms of depression. Feel like you are tired all the time. Gain weight. Get help right away if: You have chest pain. You have decreased alertness or a change in your awareness. You have abdominal pain. You feel dizzy. You have a rapid  heartbeat. You have an irregular heartbeat. You have difficulty breathing. Summary The thyroid gland is a small gland located in the lower front part of the neck, just in front of the windpipe (trachea). Hyperthyroidism is when the thyroid gland is too active (overactive) and produces too much of a hormone called thyroxine. The most common cause is Graves' disease, a disorder in which your immune system attacks the thyroid gland. Hyperthyroidism can cause various symptoms, such as unexplained weight loss, nervousness, inability to tolerate heat, or changes in your heartbeat. Treatment may include medicine to reduce the amount of thyroid hormone your body makes, radioiodine therapy, surgery, or medicines to manage symptoms. This information is not intended to replace advice given to you by your health care provider. Make sure you discuss any questions you have with your healthcare provider. Document Revised: 06/08/2020 Document Reviewed: 06/08/2020 Elsevier Patient Education  2022 [employer].

## 2021-05-05 LAB — LIPID PANEL
Chol/HDL Ratio: 5.2 ratio — ABNORMAL HIGH (ref 0.0–4.4)
Cholesterol, Total: 232 mg/dL — ABNORMAL HIGH (ref 100–199)
HDL: 45 mg/dL (ref >39)
LDL Chol Calc (NIH): 167 mg/dL — ABNORMAL HIGH (ref 0–99)
Triglycerides: 111 mg/dL (ref 0–149)
VLDL Cholesterol Cal: 20 mg/dL (ref 5–40)

## 2021-05-05 LAB — COMP. METABOLIC PANEL (12)
AST: 18 IU/L (ref 0–40)
Albumin/Globulin Ratio: 1.5 (ref 1.2–2.2)
Albumin: 4.1 g/dL (ref 3.8–4.9)
Alkaline Phosphatase: 126 IU/L — ABNORMAL HIGH (ref 44–121)
BUN/Creatinine Ratio: 34 — ABNORMAL HIGH (ref 9–23)
BUN: 12 mg/dL (ref 6–24)
Bilirubin Total: 0.5 mg/dL (ref 0.0–1.2)
Calcium: 9.8 mg/dL (ref 8.7–10.2)
Chloride: 101 mmol/L (ref 96–106)
Creatinine, Ser: 0.35 mg/dL — ABNORMAL LOW (ref 0.57–1.00)
Globulin, Total: 2.8 g/dL (ref 1.5–4.5)
Glucose: 162 mg/dL — ABNORMAL HIGH (ref 65–99)
Potassium: 4.7 mmol/L (ref 3.5–5.2)
Sodium: 140 mmol/L (ref 134–144)
Total Protein: 6.9 g/dL (ref 6.0–8.5)
eGFR: 118 mL/min/{1.73_m2} (ref >59)

## 2021-05-05 LAB — TSH: TSH: <0.005 u[IU]/mL — ABNORMAL LOW (ref 0.450–4.500)

## 2021-05-05 LAB — T3: T3, Total: 156 ng/dL (ref 71–180)

## 2021-05-05 LAB — T4, FREE: Free T4: 1.79 ng/dL — ABNORMAL HIGH (ref 0.82–1.77)

## 2021-05-09 ENCOUNTER — Other Ambulatory Visit: Payer: Self-pay

## 2021-05-09 ENCOUNTER — Ambulatory Visit (HOSPITAL_COMMUNITY)
Admission: EM | Admit: 2021-05-09 | Discharge: 2021-05-09 | Disposition: A | Discharge status: Home / Self Care | Payer: 59 | Attending: Medical Oncology | Admitting: Medical Oncology

## 2021-05-09 ENCOUNTER — Encounter (HOSPITAL_COMMUNITY): Payer: Self-pay | Admitting: *Deleted

## 2021-05-09 DIAGNOSIS — H60393 Other infective otitis externa, bilateral: Secondary | ICD-10-CM

## 2021-05-09 MED ORDER — NEOMYCIN-POLYMYXIN-HC 3.5-10000-1 OT SUSP: 4.0000 [drp] | Freq: Three times a day (TID) | OTIC | 0 refills | Status: DC

## 2021-05-09 NOTE — ED Triage Notes (Signed)
Pt reports worsening Rt ear pain.

## 2021-05-09 NOTE — ED Provider Notes (Signed)
[facility]    CSN: [id number] Arrival date & time: 05/09/21  1448      History   Chief Complaint Chief Complaint  Patient presents with   Otalgia    RT    HPI Crystal [name] is a 60 y.o. female.   HPI  Otalgia: Pt reports right  and left ear pain for the past few weeks. Ears feel itchy at times and hurt to touch. When she awakes there is some debris on the pillow. She denies ear trauma, loss of hearing, fever, cold symptoms. She has tried OTC pain medications with temporary relief.   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   Type 2 diabetes mellitus with hyperglycemia, without long-term current use of insulin ([place]) 12/17/2019   Dyslipidemia 11/19/2019   Spinal stenosis of cervical region 10/11/2016    Class: Chronic   Cervical spinal stenosis 10/11/2016   Hypertension 09/09/2016   Diabetes mellitus ([place]) 09/09/2016   Multiple thyroid nodules 01/27/2013   Disorder of thyroid 07/07/2007   VARICOSE VEIN 07/07/2007   GERD 07/07/2007    Past Surgical History:  Procedure Laterality Date   ANTERIOR CERVICAL DECOMP/DISCECTOMY FUSION N/A 10/11/2016   Procedure: ANTERIOR CERVICAL DISCECTOMY FUSION C6-7 WITH EXCISION OF OSTEOPHYTE;  Surgeon: [name], MD;  Location: [facility];  Service: Orthopedics;  Laterality: N/A;   CESAREAN SECTION     TUBAL LIGATION      OB History   No obstetric history on file.      Home Medications    Prior to Admission medications   Medication Sig Start Date End Date Taking? Authorizing Provider  acetaminophen (TYLENOL) 500 MG tablet Take 1,000 mg by mouth daily as needed for moderate pain.    Yes [provider]  amLODipine (NORVASC) 10 MG  tablet TAKE 1 TABLET BY MOUTH ONCE A DAY 12/07/20 12/07/21 Yes King, Diona Foley, NP  atenolol (TENORMIN) 25 MG tablet Take 1 tablet (25 mg total) by mouth daily. 05/04/21 05/04/22 Yes Vevelyn Francois, NP  Fluocinolone Acetonide Scalp 0.01 % OIL APPLY 2 TIMES DAILY TO SCALP FOR 2 WEEKS ON AND 1 WEEK OFF REPEAT 10/04/20 10/04/21 Yes Anderson, Dena D, PA-C  glimepiride (AMARYL) 4 MG tablet TAKE 2 TABLETS BY MOUTH ONCE A DAY BEFORE BREAKFAST 12/07/20 12/07/21 Yes King, Diona Foley, NP  JANUVIA 100 MG tablet TAKE 1 TABLET BY MOUTH ONCE A DAY 12/07/20 12/07/21 Yes King, Diona Foley, NP  ketoconazole (NIZORAL) 2 % shampoo USE 1 TIME A WEEK LATHER ON SCALP, LEAVE ON 3-5 MINUTES, RINSE WELL 10/04/20 10/04/21 Yes Anderson, Dena D, PA-C  loratadine (CLARITIN) 10 MG tablet Take 1 tablet (10 mg total) by mouth daily. 09/09/16  Yes Dorena Dew, FNP  metFORMIN (GLUCOPHAGE) 500 MG tablet TAKE 2 TABLETS BY MOUTH 2 TIMES DAILY WITH A MEAL 12/07/20 12/07/21 Yes King, Diona Foley, NP  methimazole (TAPAZOLE) 10 MG tablet Take 1 tablet (10 mg total) by mouth daily. 05/04/21 05/04/22 Yes Vevelyn Francois, NP  pantoprazole (PROTONIX) 40 MG tablet TAKE 1 TABLET BY MOUTH ONCE A DAY 12/07/20 12/07/21 Yes King, Diona Foley, NP  pyrithione zinc (SM DANDRUFF SHAMPOO) 1 % shampoo Apply  topically daily as needed for itching. 02/11/20  Yes Vevelyn Francois, NP  simvastatin (ZOCOR) 20 MG tablet TAKE 1 TABLET BY MOUTH EVERY EVENING 12/07/20 12/07/21 Yes Vevelyn Francois, NP  Specialty Vitamins Products (VITAMINS FOR THE HAIR) TABS Take 1 tablet by mouth daily. 08/11/20 08/11/21 Yes Vevelyn Francois, NP  blood glucose meter kit and supplies One Touch Verio. Use up to four times daily as directed. (FOR ICD-10 E10.9, E11.9). 02/11/20   Vevelyn Francois, NP  carbamide peroxide (DEBROX) 6.5 % OTIC solution PLACE 5 DROPS INTO THE LEFT EAR 2 (TWO) TIMES DAILY. 12/07/20 12/07/21  Vevelyn Francois, NP  doxycycline (VIBRA-TABS) 100 MG tablet TAKE 1 CAPSULE BY MOUTH TWICE A DAY WITH MEALS  10/04/20 10/04/21  Lennie Odor D, PA-C  Lancets [facility] ULTRASOFT) lancets Use as instructed 02/11/20   Vevelyn Francois, NP    Family History Family History  Problem Relation Age of Onset   Colon cancer Neg Hx    Colon polyps Neg Hx    Esophageal cancer Neg Hx    Rectal cancer Neg Hx    Stomach cancer Neg Hx     Social History Social History   Tobacco Use   Smoking status: Never   Smokeless tobacco: Never  Substance Use Topics   Alcohol use: No   Drug use: No     Allergies   Aspirin and Tramadol   Review of Systems Review of Systems  As stated above in HPI Physical Exam Triage Vital Signs ED Triage Vitals [05/09/21 1548]  Enc Vitals Group     BP 133/64     Pulse Rate 81     Resp 18     Temp 98.6 F (37 C)     Temp src      SpO2 95 %     Weight      Height      Head Circumference      Peak Flow      Pain Score 10     Pain Loc      Pain Edu?      Excl. in [place]?    No data found.  Updated Vital Signs BP 133/64   Pulse 81   Temp 98.6 F (37 C)   Resp 18   LMP 06/02/2013   SpO2 95%   Physical Exam Vitals and nursing note reviewed.  Constitutional:      General: She is not in acute distress.    Appearance: Normal appearance. She is not ill-appearing, toxic-appearing or diaphoretic.  HENT:     Head: Normocephalic and atraumatic.     Nose: Nose normal.     Mouth/Throat:     Mouth: Mucous membranes are moist.  Eyes:     Extraocular Movements: Extraocular movements intact.     Pupils: Pupils are equal, round, and reactive to light.  Cardiovascular:     Rate and Rhythm: Normal rate and regular rhythm.     Heart sounds: Normal heart sounds.  Pulmonary:     Effort: Pulmonary effort is normal.     Breath sounds: Normal breath sounds.  Musculoskeletal:     Cervical back: Normal range of motion and neck supple.  Lymphadenopathy:     Cervical: No cervical adenopathy.  Skin:    Findings: No rash.  Neurological:     Mental Status: She is alert.      UC Treatments / Results  Labs (all labs ordered are listed, but only abnormal results are  displayed) Labs Reviewed - No data to display  EKG   Radiology No results found.  Procedures Procedures (including critical care time)  Medications Ordered in UC Medications - No data to display  Initial Impression / Assessment and Plan / UC Course  I have reviewed the triage vital signs and the nursing notes.  Pertinent labs & imaging results that were available during my care of the patient were reviewed by me and considered in my medical decision making (see chart for details).     New.  Discussed otitis externa with patient.  Treating with Cortisporin drops.  Follow-up as needed. Final Clinical Impressions(s) / UC Diagnoses   Final diagnoses:  None   Discharge Instructions   None    ED Prescriptions   None    PDMP not reviewed this encounter.   Hughie Closs, [place] 05/09/21 1646

## 2021-06-01 ENCOUNTER — Other Ambulatory Visit: Payer: Self-pay | Admitting: Nurse Practitioner

## 2021-06-01 DIAGNOSIS — M25531 Pain in right wrist: Secondary | ICD-10-CM

## 2021-06-01 NOTE — Progress Notes (Signed)
   [facility] [address] [place], [place]  [address] Phone:  [phone]   Fax:  [phone] 

## 2021-06-08 ENCOUNTER — Ambulatory Visit (HOSPITAL_COMMUNITY)
Admission: EM | Admit: 2021-06-08 | Discharge: 2021-06-08 | Disposition: A | Discharge status: Home / Self Care | Payer: 59 | Attending: Emergency Medicine | Admitting: Emergency Medicine

## 2021-06-08 ENCOUNTER — Encounter (HOSPITAL_COMMUNITY): Payer: Self-pay | Admitting: Emergency Medicine

## 2021-06-08 ENCOUNTER — Other Ambulatory Visit: Payer: Self-pay

## 2021-06-08 DIAGNOSIS — H60391 Other infective otitis externa, right ear: Secondary | ICD-10-CM | POA: Diagnosis not present

## 2021-06-08 DIAGNOSIS — J309 Allergic rhinitis, unspecified: Secondary | ICD-10-CM | POA: Diagnosis not present

## 2021-06-08 MED ORDER — CETIRIZINE HCL 10 MG PO TABS: 10.0000 mg | ORAL_TABLET | Freq: Every day | ORAL | 0 refills | Status: DC

## 2021-06-08 MED ORDER — CIPROFLOXACIN-DEXAMETHASONE 0.3-0.1 % OT SUSP: 4.0000 [drp] | Freq: Two times a day (BID) | OTIC | 0 refills | Status: AC

## 2021-06-08 MED ORDER — FLUTICASONE PROPIONATE 50 MCG/ACT NA SUSP: 2.0000 | Freq: Every day | NASAL | 0 refills | Status: DC

## 2021-06-08 NOTE — ED Triage Notes (Signed)
Patient c/o RT ear pain x 2 weeks.   Patient denies fever.   Patient endorses worsening RT sided facial and neck pain.   Patient endorses sound sensitivity.   Patient was seen at this clinic previously for same problems.   Patient has tried a prescribed "ear drop" w/ no relief of symptoms.

## 2021-06-08 NOTE — Discharge Instructions (Addendum)
Please begin Zyrtec and Flonase daily as prescribed, please do not skip doses.  I have initiated a referral to ear nose and throat as well as to allergy/immunology for you.  You should hear about both of these referrals within the next week.  I have prescribed Ciprodex eardrops for you please use twice daily for the next 7 days.

## 2021-06-08 NOTE — ED Provider Notes (Signed)
[facility]    CSN: [id number] Arrival date & time: 06/08/21  1604      History   Chief Complaint Chief Complaint  Patient presents with   Otalgia    HPI Crystal [name] is a 60 y.o. female.   Patient is a poorly controlled type II diabetic who returns for unresolved right otitis externa.  Patient states she use the Cortisporin drops as prescribed and her symptoms improved however when she stopped using them her symptoms came back.  Patient states she currently has itching in her eyes across the right side of her face and her right ear and down her right neck.  Per her chart patient has a history of eczema and allergic dermatitis otherwise, she does not appear to be compliant with with allergy medications.  Patient states she did take a Zyrtec this morning which helped a little bit and then her symptoms immediately returned.  Patient today is asking for a referral to ear nose and throat as well as to an allergist.  The history is provided by the patient.  Otalgia Location:  Right Behind ear:  No abnormality Quality: Itching. Severity:  No pain Onset quality:  Gradual Duration:  1 month Timing:  Intermittent Progression:  Waxing and waning Chronicity:  Chronic Context: not direct blow, not elevation change, not foreign body in ear, not loud noise, not recent URI and not water in ear   Relieved by:  OTC medications Worsened by:  Nothing Ineffective treatments:  None tried Associated symptoms: ear discharge   Associated symptoms: no abdominal pain, no congestion, no cough, no diarrhea, no fever, no headaches, no hearing loss, no neck pain, no rash, no rhinorrhea, no sore throat, no tinnitus and no vomiting   Risk factors: no recent travel, no chronic ear infection and no prior ear surgery    Past Medical History:  Diagnosis Date   Allergy    Diabetes mellitus    GERD (gastroesophageal reflux disease)    Hyperlipidemia    Hypertension    Hyperthyroidism      Patient Active Problem List   Diagnosis Date Noted   Ear itching 10/23/2020   Hyperthyroidism 12/17/2019   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   Type 2 diabetes mellitus with hyperglycemia, without long-term current use of insulin ([place]) 12/17/2019   Dyslipidemia 11/19/2019   Spinal stenosis of cervical region 10/11/2016    Class: Chronic   Cervical spinal stenosis 10/11/2016   Hypertension 09/09/2016   Diabetes mellitus ([place]) 09/09/2016   Multiple thyroid nodules 01/27/2013   Disorder of thyroid 07/07/2007   VARICOSE VEIN 07/07/2007   GERD 07/07/2007    Past Surgical History:  Procedure Laterality Date   ANTERIOR CERVICAL DECOMP/DISCECTOMY FUSION N/A 10/11/2016   Procedure: ANTERIOR CERVICAL DISCECTOMY FUSION C6-7 WITH EXCISION OF OSTEOPHYTE;  Surgeon: [name], MD;  Location: [facility];  Service: Orthopedics;  Laterality: N/A;   CESAREAN SECTION     TUBAL LIGATION      OB History   No obstetric history on file.      Home Medications    Prior to Admission medications   Medication Sig Start Date End Date Taking? Authorizing Provider  amLODipine (NORVASC) 10 MG tablet TAKE 1 TABLET BY MOUTH ONCE A DAY 12/07/20 12/07/21 Yes King, Diona Foley, NP  cetirizine (ZYRTEC) 10 MG tablet Take 1 tablet (10 mg total) by mouth daily. 06/08/21 07/08/21 Yes Lynden Oxford, PA-C  ciprofloxacin-dexamethasone (CIPRODEX) OTIC suspension Place 4 drops into the right ear 2 (  two) times daily for 7 days. 06/08/21 06/15/21 Yes Lynden Oxford, PA-C  fluticasone (FLONASE) 50 MCG/ACT nasal spray Place 2 sprays into both nostrils daily. 06/08/21 07/08/21 Yes Lynden Oxford, PA-C  JANUVIA 100 MG tablet TAKE 1 TABLET BY MOUTH ONCE A DAY 12/07/20 12/07/21 Yes Vevelyn Francois, NP  metFORMIN (GLUCOPHAGE) 500 MG tablet TAKE 2 TABLETS BY MOUTH 2 TIMES DAILY WITH A MEAL 12/07/20 12/07/21 Yes King, Diona Foley, NP  acetaminophen (TYLENOL) 500 MG tablet Take 1,000 mg by mouth daily as needed for moderate pain.     [provider]  atenolol (TENORMIN) 25 MG tablet Take 1 tablet (25 mg total) by mouth daily. 05/04/21 05/04/22  Vevelyn Francois, NP  blood glucose meter kit and supplies One Touch Verio. Use up to four times daily as directed. (FOR ICD-10 E10.9, E11.9). 02/11/20   Vevelyn Francois, NP  carbamide peroxide (DEBROX) 6.5 % OTIC solution PLACE 5 DROPS INTO THE LEFT EAR 2 (TWO) TIMES DAILY. 12/07/20 12/07/21  Vevelyn Francois, NP  Fluocinolone Acetonide Scalp 0.01 % OIL APPLY 2 TIMES DAILY TO SCALP FOR 2 WEEKS ON AND 1 WEEK OFF REPEAT 10/04/20 10/04/21  Lennie Odor D, PA-C  glimepiride (AMARYL) 4 MG tablet TAKE 2 TABLETS BY MOUTH ONCE A DAY BEFORE BREAKFAST 12/07/20 12/07/21  Vevelyn Francois, NP  ketoconazole (NIZORAL) 2 % shampoo USE 1 TIME A WEEK LATHER ON SCALP, LEAVE ON 3-5 MINUTES, RINSE WELL 10/04/20 10/04/21  Lennie Odor D, PA-C  Lancets [facility] ULTRASOFT) lancets Use as instructed 02/11/20   Vevelyn Francois, NP  methimazole (TAPAZOLE) 10 MG tablet Take 1 tablet (10 mg total) by mouth daily. 05/04/21 05/04/22  Vevelyn Francois, NP  pantoprazole (PROTONIX) 40 MG tablet TAKE 1 TABLET BY MOUTH ONCE A DAY 12/07/20 12/07/21  Vevelyn Francois, NP  pyrithione zinc (SM DANDRUFF SHAMPOO) 1 % shampoo Apply topically daily as needed for itching. 02/11/20   Vevelyn Francois, NP  simvastatin (ZOCOR) 20 MG tablet TAKE 1 TABLET BY MOUTH EVERY EVENING 12/07/20 12/07/21  Vevelyn Francois, NP  Specialty Vitamins Products (VITAMINS FOR THE HAIR) TABS Take 1 tablet by mouth daily. 08/11/20 08/11/21  Vevelyn Francois, NP    Family History Family History  Problem Relation Age of Onset   Colon cancer Neg Hx    Colon polyps Neg Hx    Esophageal cancer Neg Hx    Rectal cancer Neg Hx    Stomach cancer Neg Hx     Social History Social History   Tobacco Use   Smoking status: Never   Smokeless tobacco: Never  Substance Use Topics   Alcohol use: No   Drug use: No     Allergies   Aspirin and Tramadol   Review of Systems Review of  Systems  Constitutional:  Negative for fever.  HENT:  Positive for ear discharge and ear pain. Negative for congestion, hearing loss, rhinorrhea, sore throat and tinnitus.   Respiratory:  Negative for cough.   Gastrointestinal:  Negative for abdominal pain, diarrhea and vomiting.  Musculoskeletal:  Negative for neck pain.  Skin:  Negative for rash.  Neurological:  Negative for headaches.  All other systems reviewed and are negative.   Physical Exam Triage Vital Signs ED Triage Vitals  Enc Vitals Group     BP 06/08/21 1651 127/79     Pulse Rate 06/08/21 1651 89     Resp 06/08/21 1651 16     Temp 06/08/21 1651 98.8  F (37.1 C)     Temp Source 06/08/21 1651 Oral     SpO2 06/08/21 1651 96 %     Weight --      Height --      Head Circumference --      Peak Flow --      Pain Score 06/08/21 1648 10     Pain Loc --      Pain Edu? --      Excl. in [place]? --    No data found.  Updated Vital Signs BP 127/79 (BP Location: Right Arm)   Pulse 89   Temp 98.8 F (37.1 C) (Oral)   Resp 16   LMP 06/02/2013   SpO2 96%   Visual Acuity Right Eye Distance:   Left Eye Distance:   Bilateral Distance:    Right Eye Near:   Left Eye Near:    Bilateral Near:     Physical Exam Constitutional:      General: She is not in acute distress.    Appearance: Normal appearance. She is not ill-appearing or toxic-appearing.  HENT:     Head: Normocephalic and atraumatic.     Right Ear: No decreased hearing noted. Drainage present. No swelling or tenderness. A middle ear effusion is present. There is no impacted cerumen. No foreign body. No mastoid tenderness. No PE tube. Tympanic membrane is erythematous. Tympanic membrane is not injected, scarred or perforated.     Left Ear: Tympanic membrane, ear canal and external ear normal. There is no impacted cerumen.     Nose: Rhinorrhea present. No congestion.     Mouth/Throat:     Mouth: Mucous membranes are moist.  Eyes:     Extraocular Movements:  Extraocular movements intact.     Conjunctiva/sclera: Conjunctivae normal.     Pupils: Pupils are equal, round, and reactive to light.  Cardiovascular:     Rate and Rhythm: Normal rate and regular rhythm.     Heart sounds: Normal heart sounds.  Pulmonary:     Effort: Pulmonary effort is normal.     Breath sounds: Normal breath sounds.  Musculoskeletal:     Cervical back: Normal range of motion and neck supple.  Skin:    General: Skin is warm and dry.  Neurological:     General: No focal deficit present.     Mental Status: She is alert.  Psychiatric:        Mood and Affect: Mood normal.        Behavior: Behavior normal.        Thought Content: Thought content normal.        Judgment: Judgment normal.     UC Treatments / Results  Labs (all labs ordered are listed, but only abnormal results are displayed) Labs Reviewed - No data to display  EKG   Radiology No results found.  Procedures Procedures (including critical care time)  Medications Ordered in UC Medications - No data to display  Initial Impression / Assessment and Plan / UC Course  I have reviewed the triage vital signs and the nursing notes.  Pertinent labs & imaging results that were available during my care of the patient were reviewed by me and considered in my medical decision making (see chart for details).     Recurrent otitis externa of right ear.  Suspect patient's symptoms are largely due to poorly controlled type 2 diabetes.  Will refer to ENT as requested.  She also appears to have severe and poorly controlled  allergic rhinitis, for this I will refer her to allergy and immunology for further work-up and possible treatment.  Patient was strongly encouraged to take her medications as recommended and not skip doses.  Patient was also encouraged to work hard to get her daily blood sugar levels under control.  Will prescribe course of Ciprodex while she is waiting for the referral to ENT.  Patient  verbalized understanding and agreement with plan.  All questions addressed. Final Clinical Impressions(s) / UC Diagnoses   Final diagnoses:  Other infective otitis externa of right ear, unspecified chronicity  Allergic rhinitis, unspecified seasonality, unspecified trigger     Discharge Instructions      Please begin Zyrtec and Flonase daily as prescribed, please do not skip doses.  I have initiated a referral to ear nose and throat as well as to allergy/immunology for you.  You should hear about both of these referrals within the next week.  I have prescribed Ciprodex eardrops for you please use twice daily for the next 7 days.     ED Prescriptions     Medication Sig Dispense Auth. Provider   ciprofloxacin-dexamethasone (CIPRODEX) OTIC suspension Place 4 drops into the right ear 2 (two) times daily for 7 days. 7.5 mL Lynden Oxford, PA-C   cetirizine (ZYRTEC) 10 MG tablet Take 1 tablet (10 mg total) by mouth daily. 30 tablet Lynden Oxford, PA-C   fluticasone [facility]) 50 MCG/ACT nasal spray Place 2 sprays into both nostrils daily. 18.2 mL Lynden Oxford, PA-C      PDMP not reviewed this encounter.   Lynden Oxford, PA-C 06/08/21 1740

## 2021-06-09 ENCOUNTER — Other Ambulatory Visit (HOSPITAL_COMMUNITY): Payer: Self-pay

## 2021-06-09 MED ORDER — CIPROFLOXACIN-DEXAMETHASONE 0.3-0.1 % OT SUSP
OTIC | 0 refills | Status: DC
  Filled 2021-06-09: qty 7.5, 18d supply, fill #0

## 2021-06-09 MED FILL — Sitagliptin Phosphate Tab 100 MG (Base Equiv): ORAL | 30 days supply | Qty: 30 | Fill #1 | Status: AC

## 2021-06-09 MED FILL — Glimepiride Tab 4 MG: ORAL | 90 days supply | Qty: 180 | Fill #0 | Status: AC

## 2021-07-05 ENCOUNTER — Emergency Department (HOSPITAL_COMMUNITY)
Admission: EM | Admit: 2021-07-05 | Discharge: 2021-07-05 | Disposition: A | Discharge status: Home / Self Care | Payer: 59 | Attending: Emergency Medicine | Admitting: Emergency Medicine

## 2021-07-05 ENCOUNTER — Encounter (HOSPITAL_COMMUNITY): Payer: Self-pay | Admitting: Emergency Medicine

## 2021-07-05 ENCOUNTER — Emergency Department (HOSPITAL_COMMUNITY): Payer: 59

## 2021-07-05 ENCOUNTER — Other Ambulatory Visit (HOSPITAL_COMMUNITY): Payer: Self-pay

## 2021-07-05 DIAGNOSIS — E119 Type 2 diabetes mellitus without complications: Secondary | ICD-10-CM | POA: Insufficient documentation

## 2021-07-05 DIAGNOSIS — M47816 Spondylosis without myelopathy or radiculopathy, lumbar region: Secondary | ICD-10-CM | POA: Insufficient documentation

## 2021-07-05 DIAGNOSIS — M545 Low back pain, unspecified: Secondary | ICD-10-CM

## 2021-07-05 DIAGNOSIS — Z79899 Other long term (current) drug therapy: Secondary | ICD-10-CM | POA: Diagnosis not present

## 2021-07-05 DIAGNOSIS — Z7984 Long term (current) use of oral hypoglycemic drugs: Secondary | ICD-10-CM | POA: Insufficient documentation

## 2021-07-05 DIAGNOSIS — I10 Essential (primary) hypertension: Secondary | ICD-10-CM | POA: Insufficient documentation

## 2021-07-05 MED ORDER — DICLOFENAC SODIUM 25 MG PO TBEC
25.0000 mg | DELAYED_RELEASE_TABLET | Freq: Two times a day (BID) | ORAL | 0 refills | Status: AC
  Filled 2021-07-05: qty 20, 10d supply, fill #0

## 2021-07-05 MED ORDER — METHOCARBAMOL 500 MG PO TABS
500.0000 mg | ORAL_TABLET | Freq: Two times a day (BID) | ORAL | 0 refills | Status: DC
  Filled 2021-07-05: qty 20, 10d supply, fill #0

## 2021-07-05 MED ORDER — LIDOCAINE 5 % EX PTCH
1.0000 | MEDICATED_PATCH | CUTANEOUS | 0 refills | Status: DC
  Filled 2021-07-05: qty 30, 30d supply, fill #0

## 2021-07-05 MED FILL — Amlodipine Besylate Tab 10 MG (Base Equivalent): ORAL | 90 days supply | Qty: 90 | Fill #0 | Status: CN

## 2021-07-05 MED FILL — Sitagliptin Phosphate Tab 100 MG (Base Equiv): ORAL | 30 days supply | Qty: 30 | Fill #2 | Status: AC

## 2021-07-05 NOTE — ED Provider Notes (Signed)
[facility] EMERGENCY DEPARTMENT Provider Note   CSN: [id number] Arrival date & time: 07/05/21  0160     History Chief Complaint  Patient presents with   Back Pain    Crystal [name] is a 60 y.o. female.  60 year old female presents with complaint of pain in her low back for the past 2 to 3 weeks without fall or injury.  Patient states pain does not radiate, denies abdominal pain, groin numbness or weakness, loss of bowel or bladder control, fevers or other complaints or concerns.  Pain is worse when she goes to transition from sitting to standing.  Patient has been taking Aleve which provides minimal relief.  Has not been to PCP or specialist for this as of yet.      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   Type 2 diabetes mellitus with hyperglycemia, without long-term current use of insulin ([place]) 12/17/2019   Dyslipidemia 11/19/2019   Spinal stenosis of cervical region 10/11/2016    Class: Chronic   Cervical spinal stenosis 10/11/2016   Hypertension 09/09/2016   Diabetes mellitus ([place]) 09/09/2016   Multiple thyroid nodules 01/27/2013   Disorder of thyroid 07/07/2007   VARICOSE VEIN 07/07/2007   GERD 07/07/2007    Past Surgical History:  Procedure Laterality Date   ANTERIOR CERVICAL DECOMP/DISCECTOMY FUSION N/A 10/11/2016   Procedure: ANTERIOR CERVICAL DISCECTOMY FUSION C6-7 WITH EXCISION OF OSTEOPHYTE;  Surgeon: [name], MD;  Location: [place];  Service: Orthopedics;  Laterality: N/A;   CESAREAN SECTION     TUBAL LIGATION       OB History   No obstetric history on file.     Family History  Problem Relation Age of Onset   Colon cancer Neg Hx    Colon polyps  Neg Hx    Esophageal cancer Neg Hx    Rectal cancer Neg Hx    Stomach cancer Neg Hx     Social History   Tobacco Use   Smoking status: Never   Smokeless tobacco: Never  Substance Use Topics   Alcohol use: No   Drug use: No    Home Medications Prior to Admission medications   Medication Sig Start Date End Date Taking? Authorizing Provider  diclofenac (VOLTAREN) 25 MG EC tablet Take 1 tablet (25 mg total) by mouth 2 (two) times daily for 10 days. 07/05/21 07/15/21 Yes Tacy Learn, PA-C  lidocaine (LIDODERM) 5 % Place 1 patch onto the skin daily. Remove & Discard patch within 12 hours or as directed by MD 07/05/21  Yes Tacy Learn, PA-C  methocarbamol (ROBAXIN) 500 MG tablet Take 1 tablet (500 mg total) by mouth 2 (two) times daily. 07/05/21  Yes Tacy Learn, PA-C  acetaminophen (TYLENOL) 500 MG tablet Take 1,000 mg by mouth daily as needed for moderate pain.     [provider]  amLODipine (NORVASC) 10 MG tablet TAKE 1 TABLET BY MOUTH ONCE A DAY 12/07/20 12/07/21  Vevelyn Francois, NP  atenolol (TENORMIN) 25 MG tablet Take 1 tablet (25 mg total) by mouth daily. 05/04/21 05/04/22  Vevelyn Francois, NP  blood glucose meter kit and supplies One Touch Verio.  Use up to four times daily as directed. (FOR ICD-10 E10.9, E11.9). 02/11/20   Vevelyn Francois, NP  carbamide peroxide (DEBROX) 6.5 % OTIC solution PLACE 5 DROPS INTO THE LEFT EAR 2 (TWO) TIMES DAILY. 12/07/20 12/07/21  Vevelyn Francois, NP  cetirizine (ZYRTEC) 10 MG tablet Take 1 tablet (10 mg total) by mouth daily. 06/08/21 07/08/21  Lynden Oxford Scales, PA-C  ciprofloxacin-dexamethasone (CIPRODEX) OTIC suspension Place 4 drops into right ear twice a day for 7 days 06/08/21   Lynden Oxford Scales, PA-C  Fluocinolone Acetonide Scalp 0.01 % OIL APPLY 2 TIMES DAILY TO SCALP FOR 2 WEEKS ON AND 1 WEEK OFF REPEAT 10/04/20 10/04/21  Lennie Odor D, PA-C  fluticasone (FLONASE) 50 MCG/ACT nasal spray Place 2 sprays into both nostrils daily.  06/08/21 07/08/21  Lynden Oxford Scales, PA-C  glimepiride (AMARYL) 4 MG tablet TAKE 2 TABLETS BY MOUTH ONCE A DAY BEFORE BREAKFAST 12/07/20 12/07/21  Vevelyn Francois, NP  JANUVIA 100 MG tablet TAKE 1 TABLET BY MOUTH ONCE A DAY 12/07/20 12/07/21  Vevelyn Francois, NP  ketoconazole (NIZORAL) 2 % shampoo USE 1 TIME A WEEK LATHER ON SCALP, LEAVE ON 3-5 MINUTES, RINSE WELL 10/04/20 10/04/21  Lennie Odor D, PA-C  Lancets [facility] ULTRASOFT) lancets Use as instructed 02/11/20   Vevelyn Francois, NP  metFORMIN (GLUCOPHAGE) 500 MG tablet TAKE 2 TABLETS BY MOUTH 2 TIMES DAILY WITH A MEAL 12/07/20 12/07/21  Vevelyn Francois, NP  methimazole (TAPAZOLE) 10 MG tablet Take 1 tablet (10 mg total) by mouth daily. 05/04/21 05/04/22  Vevelyn Francois, NP  pantoprazole (PROTONIX) 40 MG tablet TAKE 1 TABLET BY MOUTH ONCE A DAY 12/07/20 12/07/21  Vevelyn Francois, NP  pyrithione zinc (SM DANDRUFF SHAMPOO) 1 % shampoo Apply topically daily as needed for itching. 02/11/20   Vevelyn Francois, NP  simvastatin (ZOCOR) 20 MG tablet TAKE 1 TABLET BY MOUTH EVERY EVENING 12/07/20 12/07/21  Vevelyn Francois, NP  Specialty Vitamins Products (VITAMINS FOR THE HAIR) TABS Take 1 tablet by mouth daily. 08/11/20 08/11/21  Vevelyn Francois, NP    Allergies    Aspirin and Tramadol  Review of Systems   Review of Systems  Constitutional:  Negative for fever.  Gastrointestinal:  Negative for abdominal pain, constipation, diarrhea, nausea and vomiting.  Genitourinary:  Negative for decreased urine volume and difficulty urinating.  Musculoskeletal:  Positive for back pain. Negative for arthralgias, gait problem, joint swelling and myalgias.  Skin:  Negative for rash and wound.  Allergic/Immunologic: Positive for immunocompromised state.  Neurological:  Negative for weakness and numbness.  Psychiatric/Behavioral:  Negative for confusion.   All other systems reviewed and are negative.  Physical Exam Updated Vital Signs BP 135/67 (BP Location: Left Arm)   Pulse 83    Temp 98.7 F (37.1 C) (Oral)   Resp 18   Ht _0  (1.626 m)   Wt 72.6 kg   LMP 06/02/2013   SpO2 100%   BMI 27.46 kg/m   Physical Exam Vitals and nursing note reviewed.  Constitutional:      General: She is not in acute distress.    Appearance: She is well-developed. She is not diaphoretic.  HENT:     Head: Normocephalic and atraumatic.  Cardiovascular:     Pulses: Normal pulses.  Pulmonary:     Effort: Pulmonary effort is normal.  Abdominal:     Palpations: Abdomen is soft.     Tenderness: There is no abdominal tenderness.  Musculoskeletal:  General: Tenderness present. No swelling.       Back:     Right lower leg: No edema.     Left lower leg: No edema.     Comments: Mild TTP, pain mostly reproduced when patient stands up out of bed. Leg strength equal, reflexes symmetric.   Skin:    General: Skin is warm and dry.     Findings: No erythema or rash.  Neurological:     Mental Status: She is alert and oriented to person, place, and time.     Sensory: No sensory deficit.     Motor: No weakness.     Gait: Gait normal.  Psychiatric:        Behavior: Behavior normal.    ED Results / Procedures / Treatments   Labs (all labs ordered are listed, but only abnormal results are displayed) Labs Reviewed - No data to display  EKG None  Radiology DG Lumbar Spine Complete  Result Date: 07/05/2021 CLINICAL DATA:  Low back pain EXAM: LUMBAR SPINE - COMPLETE 4+ VIEW COMPARISON:  None. FINDINGS: No fracture or dislocation of the lumbar spine. Moderate multilevel disc space height loss and osteophytosis, as well as mild multilevel facet degenerative change, worst at the lower lumbar levels. Moderate burden of overlying stool. IMPRESSION: No fracture or dislocation of the lumbar spine. Moderate multilevel disc space height loss and osteophytosis, as well as mild multilevel facet degenerative change, worst at the lower lumbar levels. Lumbar disc and neural foraminal pathology  may be further evaluated by MRI if indicated by neurologically localizing signs and symptoms. Electronically Signed   By: Delanna Ahmadi M.D.   On: 07/05/2021 09:10    Procedures Procedures   Medications Ordered in ED Medications - No data to display  ED Course  I have reviewed the triage vital signs and the nursing notes.  Pertinent labs & imaging results that were available during my care of the patient were reviewed by me and considered in my medical decision making (see chart for details).  Clinical Course as of 07/05/21 1349  Thu Jul 05, 7062  [id number] 60 year old female with complaint of low back pain as above.,  On exam found to have generalized low back pain, mild with palpation however noticeable when she transitions from sitting to standing, is ambulatory with a steady gait.  Reflexes symmetric, leg strength equal.  Plan is to treat with Lidoderm patch, muscle relaxant and NSAID.  Due to history of diabetes, will avoid steroids at this time.  Advise follow-up with PCP to discuss physical therapy and other management. [LM]  9381 X-ray shows moderate multilevel disc space height loss and osteophytosis as well as mild multilevel facet degenerative changes. [LM]    Clinical Course User Index [LM] Roque Lias   MDM Rules/Calculators/A&P                           Final Clinical Impression(s) / ED Diagnoses Final diagnoses:  Acute bilateral low back pain without sciatica  Spondylosis of lumbar region without myelopathy or radiculopathy    Rx / DC Orders ED Discharge Orders          Ordered    lidocaine (LIDODERM) 5 %  Every 24 hours        07/05/21 1349    methocarbamol (ROBAXIN) 500 MG tablet  2 times daily        07/05/21 1349    diclofenac (VOLTAREN) 25 MG EC  tablet  2 times daily        07/05/21 1349             Roque Lias 07/05/21 1350    Lucrezia Starch, MD 07/06/21 [phone]

## 2021-07-05 NOTE — ED Provider Notes (Signed)
Emergency Medicine Provider Triage Evaluation Note  Crystal [name] , a 60 y.o. female  was evaluated in triage.  Pt complains of midline low back pain x 2-3 weeks without falls/heavy lifting/injury.  Denies associated abdominal pain, loss of bowel or bladder control, groin numbness.  Pain does not radiate.  Pain temporarily is relieved with Aleve.  Pain is worse when transitioning from sitting to standing.  Requesting MRI.  Review of Systems  Positive: Low back pain Negative: Abdominal pain, loss of bowel or bladder control, sadle paresthesia   Physical Exam  BP (!) 160/73 (BP Location: Left Arm)   Pulse 87   Temp 98.7 F (37.1 C) (Oral)   Resp 14   LMP 06/02/2013   SpO2 98%  Gen:   Awake, no distress   Resp:  Normal effort  MSK:   Moves extremities without difficulty  Other:  Pain not reproduced with palpation, normal gait, normal leg strength  Medical Decision Making  Medically screening exam initiated at 7:54 AM.  Appropriate orders placed.  Crystal [name] was informed that the remainder of the evaluation will be completed by another provider, this initial triage assessment does not replace that evaluation, and the importance of remaining in the ED until their evaluation is complete.     Crystal Learn, PA-C 07/05/21 0979    Crystal Starch, MD 07/05/21 1200

## 2021-07-05 NOTE — ED Triage Notes (Signed)
Pt endorses lower mid back pain for 2-3 weeks, no radiation. Ambulatory to triage. OTC meds help but pain comes right back.

## 2021-07-06 ENCOUNTER — Telehealth (INDEPENDENT_AMBULATORY_CARE_PROVIDER_SITE_OTHER): Payer: 59 | Admitting: Nurse Practitioner

## 2021-07-06 ENCOUNTER — Other Ambulatory Visit: Payer: Self-pay

## 2021-07-06 ENCOUNTER — Other Ambulatory Visit (HOSPITAL_COMMUNITY): Payer: Self-pay

## 2021-07-06 DIAGNOSIS — M47817 Spondylosis without myelopathy or radiculopathy, lumbosacral region: Secondary | ICD-10-CM

## 2021-07-06 MED ORDER — ACETAMINOPHEN-CODEINE #3 300-30 MG PO TABS
1.0000 | ORAL_TABLET | Freq: Three times a day (TID) | ORAL | 0 refills | Status: AC | PRN
  Filled 2021-07-06: qty 21, 7d supply, fill #0

## 2021-07-06 NOTE — Progress Notes (Signed)
   [place] [place], [place]  [address] Phone:  [phone]   Fax:  [phone] Virtual Visit via Telephone Note  I connected with Crystal [name] on 07/06/21 at  8:40 AM EDT by telephone and verified that I am speaking with the correct person using two identifiers.   I discussed the limitations, risks, security and privacy concerns of performing an evaluation and management service by telephone and the availability of in person appointments. I also discussed with the patient that there may be a patient responsible charge related to this service. The patient expressed understanding and agreed to proceed.  Patient home Provider Office  History of Present Illness:  Crystal [name]  has a past medical history of Allergy, Diabetes mellitus, GERD (gastroesophageal reflux disease), Hyperlipidemia, Hypertension, and Hyperthyroidism.   Back Pain Patient presents for evaluation of low back problems. She was seen in the ED on yesterday. Symptoms have been present for several weeks and include pain in lower back (aching in character; varies/10 in severity). Initial inciting event: none. Symptoms are worse  varies . Alleviating factors identifiable by the patient are medication Aleve . Aggravating factors identifiable by the patient are sitting and standing. Treatments initiated by the patient:  Robaxin, Diclofenac  . The Diclofenac caused stomach irritation and pain. Previous lower back problems:  She reports pain in her teens.  . Previous work up:  [employer] .    ROS   Observations/Objective: No exam; telephone visit  Assessment and Plan: 1. Spondylosis of lumbosacral region without myelopathy or radiculopathy - AMB referral to orthopedics - acetaminophen-codeine (TYLENOL #3) 300-30 MG tablet; Take 1 tablet by mouth every 8 (eight) hours as needed for up to 7 days for moderate pain.  Dispense: 30 tablet; Refill: 0 - Ambulatory referral to Physical  Therapy   Follow Up Instructions:  As scheduled   I discussed the assessment and treatment plan with the patient. The patient was provided an opportunity to ask questions and all were answered. The patient agreed with the plan and demonstrated an understanding of the instructions.   The patient was advised to call back or seek an in-person evaluation if the symptoms worsen or if the condition fails to improve as anticipated.  I provided 10 minutes of telephone- visit time during this encounter.   Vevelyn Francois, NP

## 2021-07-06 NOTE — Patient Instructions (Signed)
Spondylolysis Spondylolysis is a small break or crack (stress fracture) in a bone in the spine (vertebra) in the lower back (lumbar spine). The stress fracture occurs on the bony mass between and behind the vertebra. Spondylolysis may be caused by an injury (trauma) or by overuse. Since the lower back is almost always under pressure from daily living, this stress fracture usually does not heal normally. Spondylolysis may eventually cause one vertebra to slip forward and out of place (spondylolisthesis). What are the causes? This condition may be caused by: Trauma, such as a fall. Excessive wear and tear. This is often a result of doing sports or physical activities that involve repetitive overstretching (hyperextension) and rotation of the spine. What increases the risk? You are more likely to develop this condition if you have: A family history of this condition. An inward curvature of your spine (lordosis). A condition that affects your spine, such as spina bifida. You are more likely to develop this condition if you participate in: Gymnastics. Dance. Football. Wrestling. Martial arts. Weight lifting. Tennis. Swimming. What are the signs or symptoms? Symptoms of this condition may include: Long-lasting (chronic) pain in the lower back. Stiffness in the back or legs. Tightness in the hamstring muscles, which are in the backs of the thighs. In some cases, there may be no symptoms of this condition. How is this diagnosed? This condition may be diagnosed based on: Your symptoms. Your medical history. A physical exam. Imaging tests, such as: X-rays. CT scan. MRI. How is this treated? This condition may be treated by: Resting. You may be asked to avoid or modify activities that put strain on your back until your symptoms improve. Medicines to help relieve pain. NSAIDs to help reduce swelling and discomfort. Injections of medicine (cortisone) in your back. These injections can  help to relieve pain and numbness. A brace to stabilize and support your back. Physical therapy. You may work with an occupational therapist or physical therapist who can teach you how to reduce pressure on your back while you do everyday activities. Surgery. This may be needed if you have: A severe injury. Pain that lasts for more than 6 months. Numbness in your pelvic region. Changes in control of your stool or urine. Follow these instructions at home: Medicines Take over-the-counter and prescription medicines only as told by your health care provider. Ask your health care provider if the medicine prescribed to you: Requires you to avoid driving or using heavy machinery. Can cause constipation. You may need to take these actions to prevent or treat constipation: Drink enough fluid to keep your urine pale yellow. Take over-the-counter or prescription medicines. Eat foods that are high in fiber, such as beans, whole grains, and fresh fruits and vegetables. Limit foods that are high in fat and processed sugars, such as fried or sweet foods. If you have a brace: Wear the brace as told by your health care provider. Remove it only as told by your health care provider. Keep the brace clean. If the brace is not waterproof: Do not let it get wet. Cover it with a watertight covering when you take a bath or a shower. Activity Rest and return to your normal activities as told by your health care provider. Ask your health care provider what activities are safe for you. Ask your health care provider when it is safe to drive if you have a back brace. Work with a physical therapist to make a safe exercise program, as recommended by your health care   provider. Do exercises as told by your physical therapist. This may include exercises to strengthen your back and abdominal muscles (core exercises). Managing pain, stiffness, and swelling   If directed, put ice on the affected area. If you have a  removable brace, remove it as told by your health care provider. Put ice in a plastic bag. Place a towel between your skin and the bag. Leave the ice on for 20 minutes, 2-3 times a day. If directed, apply heat to the affected area as often as told by your health care provider. Use the heat source that your health care provider recommends, such as a moist heat pack or a heating pad. If you have a removable brace, remove it as told by your health care provider. Place a towel between your skin and the heat source. Leave the heat on for 20-30 minutes. Remove the heat if your skin turns bright red. This is especially important if you are unable to feel pain, heat, or cold. You may have a greater risk of getting burned. General instructions Do not use any products that contain nicotine or tobacco, such as cigarettes, e-cigarettes, and chewing tobacco. These can delay bone healing. If you need help quitting, ask your health care provider. Maintain a healthy weight. Extra weight puts stress on your back. Keep all follow-up visits as told by your health care provider. This is important. Contact a health care provider if: You have pain that gets worse or does not get better. Get help right away if: You have severe back pain. You have changes in control of your stool or urine. You develop weakness or numbness in your legs. You are unable to stand or walk. Summary Spondylolysis is a small break or crack (stress fracture) in a bone in the spine (vertebra) in the lower back (lumbar spine). This condition may be treated by resting, medicines, physical therapy, wearing a brace, or surgery. Rest and return to your normal activities as told by your health care provider. Ask your health care provider what activities are safe for you. Contact a health care provider if you have pain that gets worse or does not get better. This information is not intended to replace advice given to you by your health care  provider. Make sure you discuss any questions you have with your health care provider. Document Revised: 01/14/2019 Document Reviewed: 04/28/2018 [employer]vier Patient Education  2022 [employer]  

## 2021-07-10 ENCOUNTER — Ambulatory Visit: Payer: 59

## 2021-07-12 ENCOUNTER — Ambulatory Visit: Payer: 59 | Admitting: Surgery

## 2021-07-16 ENCOUNTER — Other Ambulatory Visit (HOSPITAL_COMMUNITY): Payer: Self-pay

## 2021-07-17 ENCOUNTER — Ambulatory Visit: Payer: Self-pay | Admitting: Allergy

## 2021-07-17 NOTE — Progress Notes (Deleted)
New Patient Note  RE: Crystal [name] MRN: [id number] DOB: June 17, 1961 Date of Office Visit: 07/17/2021  Consult requested by: Theadora Rama Scales,* Primary care provider: Barbette Merino, NP  Chief Complaint: No chief complaint on file.  History of Present Illness: I had the pleasure of seeing Crystal [name] for initial evaluation at the [facility] on 07/17/2021. She is a 60 y.o. female, who is referred here by Barbette Merino, NP for the evaluation of allergic rhinitis.  She reports symptoms of ***. Symptoms have been going on for *** years. The symptoms are present *** all year around with worsening in ***. Other triggers include exposure to ***. Anosmia: ***. Headache: ***. She has used *** with ***fair improvement in symptoms. Sinus infections: ***. Previous work up includes: ***. Previous ENT evaluation: ***. Previous sinus imaging: ***. History of nasal polyps: ***. Last eye exam: ***. History of reflux: ***.  06/08/2021 UC visit: "Patient is a poorly controlled type II diabetic who returns for unresolved right otitis externa.  Patient states she use the Cortisporin drops as prescribed and her symptoms improved however when she stopped using them her symptoms came back.  Patient states she currently has itching in her eyes across the right side of her face and her right ear and down her right neck.  Per her chart patient has a history of eczema and allergic dermatitis otherwise, she does not appear to be compliant with with allergy medications.  Patient states she did take a Zyrtec this morning which helped a little bit and then her symptoms immediately returned.  Patient today is asking for a referral to ear nose and throat as well as to an allergist.  Recurrent otitis externa of right ear.  Suspect patient's symptoms are largely due to poorly controlled type 2 diabetes.  Will refer to ENT as requested.  She also appears to have severe and poorly controlled  allergic rhinitis, for this I will refer her to allergy and immunology for further work-up and possible treatment.  Patient was strongly encouraged to take her medications as recommended and not skip doses.  Patient was also encouraged to work hard to get her daily blood sugar levels under control.  Will prescribe course of Ciprodex while she is waiting for the referral to ENT.  Patient verbalized understanding and agreement with plan.  All questions addressed."  Assessment and Plan: Crystal [name] is a 60 y.o. female with: No problem-specific Assessment & Plan notes found for this encounter.  No follow-ups on file.  No orders of the defined types were placed in this encounter.  Lab Orders  No laboratory test(s) ordered today    Other allergy screening: Asthma: {Blank single:19197::"yes","no"} Rhino conjunctivitis: {Blank single:19197::"yes","no"} Food allergy: {Blank single:19197::"yes","no"} Medication allergy: {Blank single:19197::"yes","no"} Hymenoptera allergy: {Blank single:19197::"yes","no"} Urticaria: {Blank single:19197::"yes","no"} Eczema:{Blank single:19197::"yes","no"} History of recurrent infections suggestive of immunodeficency: {Blank single:19197::"yes","no"}  Diagnostics: Spirometry:  Tracings reviewed. Her effort: {Blank single:19197::"Good reproducible efforts.","It was hard to get consistent efforts and there is a question as to whether this reflects a maximal maneuver.","Poor effort, data can not be interpreted."} FVC: ***L FEV1: ***L, ***% predicted FEV1/FVC ratio: ***% Interpretation: {Blank single:19197::"Spirometry consistent with mild obstructive disease","Spirometry consistent with moderate obstructive disease","Spirometry consistent with severe obstructive disease","Spirometry consistent with possible restrictive disease","Spirometry consistent with mixed obstructive and restrictive disease","Spirometry uninterpretable due to technique","Spirometry consistent with  normal pattern","No overt abnormalities noted given today's efforts"}.  Please see scanned spirometry results for details.  Skin Testing: {Blank single:19197::"Select  foods","Environmental allergy panel","Environmental allergy panel and select foods","Food allergy panel","None","Deferred due to recent antihistamines use"}. *** Results discussed with patient/family.   Past Medical History: Patient Active Problem List   Diagnosis Date Noted  . Ear itching 10/23/2020  . Hyperthyroidism 12/17/2019  . 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 Type 2 diabetes mellitus with hyperglycemia, without long-term current use of insulin ([place]) 12/17/2019  . Dyslipidemia 11/19/2019  . Spinal stenosis of cervical region 10/11/2016    Class: Chronic  . Cervical spinal stenosis 10/11/2016  . Hypertension 09/09/2016  . Diabetes mellitus ([place]) 09/09/2016  . Multiple thyroid nodules 01/27/2013  . Disorder of thyroid 07/07/2007  . VARICOSE VEIN 07/07/2007  . GERD 07/07/2007   Past Medical History:  Diagnosis Date  . Allergy   . Diabetes mellitus   . GERD (gastroesophageal reflux disease)   . Hyperlipidemia   . Hypertension   . Hyperthyroidism    Past Surgical History: Past Surgical History:  Procedure Laterality Date  . ANTERIOR CERVICAL DECOMP/DISCECTOMY FUSION N/A 10/11/2016   Procedure: ANTERIOR CERVICAL DISCECTOMY FUSION C6-7 WITH EXCISION OF OSTEOPHYTE;  Surgeon: [name], MD;  Location: [facility];  Service: Orthopedics;  Laterality: N/A;  . CESAREAN SECTION    . TUBAL LIGATION     Medication List:  Current Outpatient Medications  Medication Sig Dispense Refill  . acetaminophen (TYLENOL) 500 MG tablet Take 1,000 mg by mouth daily as needed for moderate pain.     [name] amLODipine (NORVASC) 10 MG tablet TAKE 1 TABLET BY MOUTH ONCE A DAY 90 tablet 3  . atenolol (TENORMIN) 25 MG tablet Take 1 tablet (25 mg total) by mouth daily. 30 tablet 0  . blood glucose meter kit and supplies One Touch Verio.  Use up to four times daily as directed. (FOR ICD-10 E10.9, E11.9). 1 each 0  . carbamide peroxide (DEBROX) 6.5 % OTIC solution PLACE 5 DROPS INTO THE LEFT EAR 2 (TWO) TIMES DAILY. 15 mL 0  . cetirizine (ZYRTEC) 10 MG tablet Take 1 tablet (10 mg total) by mouth daily. 30 tablet 0  . ciprofloxacin-dexamethasone (CIPRODEX) OTIC suspension Place 4 drops into right ear twice a day for 7 days 7.5 mL 0  . Fluocinolone Acetonide Scalp 0.01 % OIL APPLY 2 TIMES DAILY TO SCALP FOR 2 WEEKS ON AND 1 WEEK OFF REPEAT 118.28 mL 2  . fluticasone (FLONASE) 50 MCG/ACT nasal spray Place 2 sprays into both nostrils daily. 18.2 mL 0  . glimepiride (AMARYL) 4 MG tablet TAKE 2 TABLETS BY MOUTH ONCE A DAY BEFORE BREAKFAST 180 tablet 3  . JANUVIA 100 MG tablet TAKE 1 TABLET BY MOUTH ONCE A DAY 90 tablet 3  . ketoconazole (NIZORAL) 2 % shampoo USE 1 TIME A WEEK LATHER ON SCALP, LEAVE ON 3-5 MINUTES, RINSE WELL 120 mL 5  . Lancets (ONETOUCH ULTRASOFT) lancets Use as instructed 100 each 12  . lidocaine (LIDODERM) 5 % Place 1 patch onto the skin daily. Remove & Discard patch within 12 hours or as directed by MD 30 patch 0  . metFORMIN (GLUCOPHAGE) 500 MG tablet TAKE 2 TABLETS BY MOUTH 2 TIMES DAILY WITH A MEAL 360 tablet 3  . methimazole (TAPAZOLE) 10 MG tablet Take 1 tablet (10 mg total) by mouth daily. 90 tablet 3  . methocarbamol (ROBAXIN) 500 MG tablet Take 1 tablet (500 mg total) by mouth 2 (two) times daily. 20 tablet 0  . pantoprazole (PROTONIX) 40 MG tablet TAKE 1 TABLET BY MOUTH ONCE A DAY 90 tablet  3  . pyrithione zinc (SM DANDRUFF SHAMPOO) 1 % shampoo Apply topically daily as needed for itching. 400 mL 12  . simvastatin (ZOCOR) 20 MG tablet TAKE 1 TABLET BY MOUTH EVERY EVENING 90 tablet 3  . Specialty Vitamins Products (VITAMINS FOR THE HAIR) TABS Take 1 tablet by mouth daily. 90 tablet 3   Current Facility-Administered Medications  Medication Dose Route Frequency Provider Last Rate Last Admin  . MEDERMA gel    Topical Once [name], MD       Allergies: Allergies  Allergen Reactions  . Aspirin Nausea And Vomiting  . Tramadol     Upset stomach    Social History: Social History   Socioeconomic History  . Marital status: Married    Spouse name: Not on file  . Number of children: Not on file  . Years of education: Not on file  . Highest education level: Not on file  Occupational History  . Not on file  Tobacco Use  . Smoking status: Never  . Smokeless tobacco: Never  Substance and Sexual Activity  . Alcohol use: No  . Drug use: No  . Sexual activity: Not on file  Other Topics Concern  . Not on file  Social History Narrative  . Not on file   Social Determinants of Health   Financial Resource Strain: Not on file  Food Insecurity: Not on file  Transportation Needs: Not on file  Physical Activity: Not on file  Stress: Not on file  Social Connections: Not on file   Lives in a ***. Smoking: *** Occupation: ***  Environmental HistoryFreight forwarder in the house: Estate agent in the family room: {Blank single:19197::"yes","no"} Carpet in the bedroom: {Blank single:19197::"yes","no"} Heating: {Blank single:19197::"electric","gas","heat pump"} Cooling: {Blank single:19197::"central","window","heat pump"} Pet: {Blank single:19197::"yes ***","no"}  Family History: Family History  Problem Relation Age of Onset  . Colon cancer Neg Hx   . Colon polyps Neg Hx   . Esophageal cancer Neg Hx   . Rectal cancer Neg Hx   . Stomach cancer Neg Hx    Problem                               Relation Asthma                                   *** Eczema                                *** Food allergy                          *** Allergic rhino conjunctivitis     ***  Review of Systems  Constitutional:  Negative for appetite change, chills, fever and unexpected weight change.  HENT:  Negative for congestion and rhinorrhea.   Eyes:  Negative for  itching.  Respiratory:  Negative for cough, chest tightness, shortness of breath and wheezing.   Cardiovascular:  Negative for chest pain.  Gastrointestinal:  Negative for abdominal pain.  Genitourinary:  Negative for difficulty urinating.  Skin:  Negative for rash.  Neurological:  Negative for headaches.   Objective: LMP 06/02/2013  There is no height or weight on file to calculate BMI. Physical Exam Vitals and nursing note reviewed.  Constitutional:  Appearance: Normal appearance. She is well-developed.  HENT:     Head: Normocephalic and atraumatic.     Right Ear: Tympanic membrane and external ear normal.     Left Ear: Tympanic membrane and external ear normal.     Nose: Nose normal.     Mouth/Throat:     Mouth: Mucous membranes are moist.     Pharynx: Oropharynx is clear.  Eyes:     Conjunctiva/sclera: Conjunctivae normal.  Cardiovascular:     Rate and Rhythm: Normal rate and regular rhythm.     Heart sounds: Normal heart sounds. No murmur heard.   No friction rub. No gallop.  Pulmonary:     Effort: Pulmonary effort is normal.     Breath sounds: Normal breath sounds. No wheezing, rhonchi or rales.  Musculoskeletal:     Cervical back: Neck supple.  Skin:    General: Skin is warm.     Findings: No rash.  Neurological:     Mental Status: She is alert and oriented to person, place, and time.  Psychiatric:        Behavior: Behavior normal.  The plan was reviewed with the patient/family, and all questions/concerned were addressed.  It was my pleasure to see Crystal [name] today and participate in her care. Please feel free to contact me with any questions or concerns.  Sincerely,  Rexene Alberts, DO Allergy & Immunology  [facility] office: [place] office: [phone]

## 2021-07-31 ENCOUNTER — Other Ambulatory Visit: Payer: Self-pay

## 2021-07-31 ENCOUNTER — Ambulatory Visit: Payer: 59 | Attending: Nurse Practitioner

## 2021-07-31 DIAGNOSIS — M6281 Muscle weakness (generalized): Secondary | ICD-10-CM | POA: Diagnosis present

## 2021-07-31 DIAGNOSIS — G8929 Other chronic pain: Secondary | ICD-10-CM | POA: Diagnosis present

## 2021-07-31 DIAGNOSIS — R293 Abnormal posture: Secondary | ICD-10-CM | POA: Diagnosis present

## 2021-07-31 DIAGNOSIS — M545 Low back pain, unspecified: Secondary | ICD-10-CM | POA: Diagnosis not present

## 2021-07-31 NOTE — Therapy (Signed)
[place], [place], [address] Phone: [phone]   Fax:  [phone]  Physical Therapy Evaluation  Patient Details  Name: Crystal [name] FGHWEX MRN: [id number] Date of Birth: [birth year] Referring Provider (PT): Vevelyn Francois, NP   Encounter Date: 07/31/2021   PT End of Session - 07/31/21 1622     Visit Number 1    Number of Visits 13    Date for PT Re-Evaluation 09/15/21    Authorization Type [employer]    PT Start Time 1622   patient late   PT Stop Time 1700    PT Time Calculation (min) 38 min    Activity Tolerance Patient tolerated treatment well    Behavior During Therapy [facility] for tasks assessed/performed             Past Medical History:  Diagnosis Date   Allergy    Diabetes mellitus    GERD (gastroesophageal reflux disease)    Hyperlipidemia    Hypertension    Hyperthyroidism     Past Surgical History:  Procedure Laterality Date   ANTERIOR CERVICAL DECOMP/DISCECTOMY FUSION N/A 10/11/2016   Procedure: ANTERIOR CERVICAL DISCECTOMY FUSION C6-7 WITH EXCISION OF OSTEOPHYTE;  Surgeon: [name], MD;  Location: [facility];  Service: Orthopedics;  Laterality: N/A;   CESAREAN SECTION     TUBAL LIGATION      There were no vitals filed for this visit.    Subjective Assessment - 07/31/21 1622     Subjective Patient reports mid/lower back pain that started in May and went away and then returned over the past month. She reports increased pain when she transfers to standing after she has been lying down. The pain is sometimes so bad that she needs assistance to transfer from sit to stand. Patient denies any MOI in May or this past month to cause back pain. No previous history of back pain. She denies any numbness or tingling. She reports diarrhea that has been going on since Friday. She denies any abdominal pain or changes in diet. She also reports Rt shoulder pain and neck pain and has difficulty  keeping her Rt hand steady when she writes.    Limitations Lifting;Other (comment)   transfers   How long can you sit comfortably? sitting is ok    How long can you stand comfortably? 1 hour    How long can you walk comfortably? 45 minutes    Diagnostic tests Lumbar X-ray IMPRESSION:  No fracture or dislocation of the lumbar spine. Moderate multilevel  disc space height loss and osteophytosis, as well as mild multilevel  facet degenerative change, worst at the lower lumbar levels. Lumbar  disc and neural foraminal pathology may be further evaluated by MRI  if indicated by neurologically localizing signs and symptoms.    Patient Stated Goals "to get better." (has difficulty with picking objects up from the floor    Currently in Pain? Yes    Pain Score 6     Pain Location Back    Pain Orientation Lower    Pain Descriptors / Indicators Stabbing    Pain Type Chronic pain    Pain Onset 1 to 4 weeks ago    Pain Frequency Intermittent    Aggravating Factors  transfering to standing when she has been standing or laying for prolonged periods    Pain Relieving Factors OTC pain reliever  [facility] PT Assessment - 07/31/21 0001       Assessment   Medical Diagnosis M47.817 (ICD-10-CM) - Spondylosis of lumbosacral region without myelopathy or radiculopathy    Referring Provider (PT) Vevelyn Francois, NP    Onset Date/Surgical Date --   acute on chronic low back pain; worsening over the past month   Hand Dominance Right    Prior Therapy Yes- Rt shoulder      Precautions   Precautions None      Restrictions   Weight Bearing Restrictions No      Balance Screen   Has the patient fallen in the past 6 months No      [place] residence    Living Arrangements Spouse/significant other;Children    Additional Comments no stairs      Prior Function   Level of Independence Independent    Vocation Unemployed    Leisure walking      Cognition    Overall Cognitive Status Within Functional Limits for tasks assessed      Observation/Other Assessments   Focus on Therapeutic Outcomes (FOTO)  N/A due to language      Sensation   Light Touch Not tested      Coordination   Gross Motor Movements are Fluid and Coordinated Yes      Posture/Postural Control   Posture Comments decreased lordosis, posterior pelvic tilt, and decreased muscle bulk in glutes.      AROM   Lumbar Flexion fingertips 50 cm from floor   pain low back   Lumbar Extension 75% limited   pain low back   Lumbar - Right Side Bend fingertips 4 cm from joint line    Lumbar - Left Side Bend fingertips 8 cm from joint line   pain low back   Lumbar - Right Rotation 50% limited    Lumbar - Left Rotation 50% limited      Strength   Right Hip Flexion 3+/5    Right Hip Extension 4/5    Right Hip ABduction 4-/5    Left Hip Flexion 3+/5    Left Hip Extension 4-/5    Left Hip ABduction 4/5      Flexibility   Soft Tissue Assessment /Muscle Length yes   90/90 L < R soft tissue end feel   Hamstrings WNL, less ROM on Lt    Quadriceps WNL, hypermobility bilaterally      Palpation   Spinal mobility pain and hypomobility in lumbar spine    SI assessment  R iliac crest elevated > L; L LE longer than R supine -> long sit;    Palpation comment TTP bilat glutes  L > R and paraspinals with palpable tightness in paraspinals.      Special Tests   Other special tests (+) SLR on Rt, FABER (+) R                        Objective measurements completed on examination: See above findings.                PT Education - 07/31/21 1824     Education Details education on assessment findings, POC, and HEP.    Person(s) Educated Patient    Methods Explanation;Demonstration;Tactile cues;Verbal cues;Handout    Comprehension Verbalized understanding;Returned demonstration;Verbal cues required;Tactile cues required;Need further instruction              PT  Short Term Goals - 07/31/21  [place] #1   Title Patient will be independent with initial HEP.    Baseline issued at eval    Status New    Target Date 08/14/21      PT SHORT TERM GOAL #2   Title Patient will demonstrate knowledge and application of appropriate sitting and standing posture to reduce stress on her low back.    Baseline see posture assessment    Status New    Target Date 08/21/21               PT Long Term Goals - 07/31/21 1708       PT LONG TERM GOAL #1   Title Patient will be able to complete sit to stand transfer after laying down for extended period of time without need for assistance.    Baseline patient reports requiring assistance to transfer when she has been laying down or sitting for prolonged period due to severe back pain    Status New    Target Date 09/11/21      PT LONG TERM GOAL #2   Title Patient will demonstrate at least 4+/5 bilateral hip strength to improve stability about the chain with prolonged standing and walking.    Baseline see flowsheet    Status New    Target Date 09/11/21      PT LONG TERM GOAL #3   Title Patient will improve trunk flexion AROM by at least 20 cm when measured from fingertips to floor to improve ability to complete bending activity.    Baseline 50 cm from floor    Status New    Target Date 09/11/21      PT LONG TERM GOAL #4   Title Patient will improve lumbar extension AROM by at least 25% to improve ability to reach overhead with proper mechanics.    Baseline 75% limited    Status New    Target Date 09/11/21                    Plan - 07/31/21 1716     Clinical Impression Statement Patient is a 60 y/o female who presents to OPPT with chief complaint of acute on chronic low back pain. She reports the pain began in May without known cause and subsided only to return this past month without known cause. Upon assessment she is noted to have limited trunk AROM in all planes with  flexion being the most limited and painful. She has significant hip weakness bilaterally. She is noted to have decreased lordosis and posterior pelvic tilt in standing. She has pain and hypomobility about the lumbar spine and tautness/palpable tenderness about bilateral lumbar paraspinals. She will benefit from skilled PT to address the above stated deficits in order to optimize her function.    Personal Factors and Comorbidities Age;Time since onset of injury/illness/exacerbation;Fitness    Examination-Activity Limitations Transfers    Examination-Participation Restrictions Cleaning    Stability/Clinical Decision Making Stable/Uncomplicated    Clinical Decision Making Low    Rehab Potential Good    PT Frequency 2x / week    PT Duration 6 weeks    PT Treatment/Interventions ADLs/Self Care Home Management;Cryotherapy;Electrical Stimulation;Moist Heat;Traction;Functional mobility training;Therapeutic activities;Therapeutic exercise;Balance training;Neuromuscular re-education;Patient/family education;Manual techniques;Passive range of motion;Dry needling;Taping;Spinal Manipulations    PT Next Visit Plan posture education, lumbar spine mobility, hip strengthening    PT Home Exercise Plan Access Code: [email or web address]    Consulted and Agree with Plan  of Care Patient             Patient will benefit from skilled therapeutic intervention in order to improve the following deficits and impairments:  Decreased range of motion, Pain, Improper body mechanics, Hypomobility, Postural dysfunction, Decreased strength  Visit Diagnosis: Chronic bilateral low back pain without sciatica  Muscle weakness (generalized)  Abnormal posture     Problem List Patient Active Problem List   Diagnosis Date Noted   Ear itching 10/23/2020   Hyperthyroidism 12/17/2019   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   Type 2 diabetes mellitus with hyperglycemia, without long-term current use of insulin ([place]) 12/17/2019    Dyslipidemia 11/19/2019   Spinal stenosis of cervical region 10/11/2016    Class: Chronic   Cervical spinal stenosis 10/11/2016   Hypertension 09/09/2016   Diabetes mellitus ([place]) 09/09/2016   Multiple thyroid nodules 01/27/2013   Disorder of thyroid 07/07/2007   VARICOSE VEIN 07/07/2007   GERD 07/07/2007  Gwendolyn Grant, PT, DPT, ATC 07/31/21 6:42 PM  [place] [facility] [address] [place], [place], [address] Phone: [phone]   Fax:  [phone]  Name: Crystal [name] MRN: [id number] Date of Birth: 21-Apr-1961

## 2021-08-06 ENCOUNTER — Ambulatory Visit: Payer: Self-pay | Admitting: Nurse Practitioner

## 2021-08-08 ENCOUNTER — Ambulatory Visit: Payer: 59 | Admitting: Nurse Practitioner

## 2021-08-14 ENCOUNTER — Other Ambulatory Visit: Payer: Self-pay

## 2021-08-14 ENCOUNTER — Ambulatory Visit: Payer: 59 | Attending: Nurse Practitioner

## 2021-08-14 DIAGNOSIS — G8929 Other chronic pain: Secondary | ICD-10-CM | POA: Insufficient documentation

## 2021-08-14 DIAGNOSIS — M6281 Muscle weakness (generalized): Secondary | ICD-10-CM | POA: Insufficient documentation

## 2021-08-14 DIAGNOSIS — M545 Low back pain, unspecified: Secondary | ICD-10-CM | POA: Insufficient documentation

## 2021-08-14 DIAGNOSIS — R293 Abnormal posture: Secondary | ICD-10-CM | POA: Insufficient documentation

## 2021-08-15 NOTE — Therapy (Addendum)
[place], [place], [address] Phone: [phone]   Fax:  [phone]  Physical Therapy Treatment  Patient Details  Name: Crystal [name] MRN: [id number] Date of Birth: [birth year] Referring Provider (PT): Vevelyn Francois, NP   Encounter Date: 08/14/2021   PT End of Session - 08/14/21 0909     Visit Number 2    Number of Visits 13    Date for PT Re-Evaluation 09/15/21    Authorization Type [employer]    PT Start Time 6440   Session limited due to patient being late.   PT Stop Time 1615    PT Time Calculation (min) 28 min    Activity Tolerance Patient tolerated treatment well    Behavior During Therapy WFL for tasks assessed/performed             Past Medical History:  Diagnosis Date   Allergy    Diabetes mellitus    GERD (gastroesophageal reflux disease)    Hyperlipidemia    Hypertension    Hyperthyroidism     Past Surgical History:  Procedure Laterality Date   ANTERIOR CERVICAL DECOMP/DISCECTOMY FUSION N/A 10/11/2016   Procedure: ANTERIOR CERVICAL DISCECTOMY FUSION C6-7 WITH EXCISION OF OSTEOPHYTE;  Surgeon: [name], MD;  Location: [place];  Service: Orthopedics;  Laterality: N/A;   CESAREAN SECTION     TUBAL LIGATION      There were no vitals filed for this visit.   Subjective Assessment - 08/14/21 0904     Subjective Patient says that she has been doing her HEP and that it has helped. Pt says that she still has pain in her back and in Lt glute.    Limitations Lifting;Other (comment)   transfers   How long can you sit comfortably? sitting is ok    How long can you stand comfortably? 1 hour    How long can you walk comfortably? 45 minutes    Diagnostic tests Lumbar X-ray IMPRESSION:  No fracture or dislocation of the lumbar spine. Moderate multilevel  disc space height loss and osteophytosis, as well as mild multilevel  facet degenerative change, worst at the lower lumbar  levels. Lumbar  disc and neural foraminal pathology may be further evaluated by MRI  if indicated by neurologically localizing signs and symptoms.    Patient Stated Goals "to get better." (has difficulty with picking objects up from the floor    Currently in Pain? Yes    Pain Score 4     Pain Location Back   Lt leg   Pain Orientation Left    Pain Descriptors / Indicators Stabbing    Pain Type Chronic pain            [facility] Adult PT Treatment/Exercise:  Therapeutic Exercise: - standing hip extension 3x10 bilat  - supine bridges 2x10  - repeated lumbar extension standing 3x15  - Deadbug with LE and UE *discontinued due to valsalva 1x5 - seated pelvic tilts 1x5 *discontinued due to inability to understand exercise or correct form with cues.    Manual Therapy: - NA  Neuromuscular re-ed: - diaphragmatic breathing 3x10, supine  - single leg balance 2x10 seconds bilat   Therapeutic Activity: - NA  Self-care/Home Management: - see patient education                              PT Education - 08/14/21 1913  Education Details Pt instructed on exercises for pain management and updated HEP.    Person(s) Educated Patient    Methods Explanation;Demonstration;Handout    Comprehension Verbalized understanding;Tactile cues required              PT Short Term Goals - 07/31/21 1704       PT SHORT TERM GOAL #1   Title Patient will be independent with initial HEP.    Baseline issued at eval    Status New    Target Date 08/14/21      PT SHORT TERM GOAL #2   Title Patient will demonstrate knowledge and application of appropriate sitting and standing posture to reduce stress on her low back.    Baseline see posture assessment    Status New    Target Date 08/21/21               PT Long Term Goals - 07/31/21 1708       PT LONG TERM GOAL #1   Title Patient will be able to complete sit to stand transfer after laying down for extended period  of time without need for assistance.    Baseline patient reports requiring assistance to transfer when she has been laying down or sitting for prolonged period due to severe back pain    Status New    Target Date 09/11/21      PT LONG TERM GOAL #2   Title Patient will demonstrate at least 4+/5 bilateral hip strength to improve stability about the chain with prolonged standing and walking.    Baseline see flowsheet    Status New    Target Date 09/11/21      PT LONG TERM GOAL #3   Title Patient will improve trunk flexion AROM by at least 20 cm when measured from fingertips to floor to improve ability to complete bending activity.    Baseline 50 cm from floor    Status New    Target Date 09/11/21      PT LONG TERM GOAL #4   Title Patient will improve lumbar extension AROM by at least 25% to improve ability to reach overhead with proper mechanics.    Baseline 75% limited    Status New    Target Date 09/11/21                   Plan - 08/14/21 1915     Clinical Impression Statement Patient presents with reports of pain in low back and Lt glute. Pt reports decrease in pain following repeated lumbar extension, but indicates "stretch" in back of Lt thigh. Pt unable to demonstrate initial HEP correctly and unable to correct form with verbal and tactile cues. Discontinued pelvic tilt exercises due to inability to correct form and maintain position. Patient demonstrated significant difficulty activating abdominal muscles without valsalva manuever. With heavy verbal and tactile cues, pt was able to demonstrate core activation without valsalva during diaphragmatic breathing without any reported increased pain. Moderate verbal and tactile cues given throughout all activities per procedure list for proper form and technique, pt demonstrated significant motor control deficits indicated by inability to maintain form without cues. Updated HEP.    Personal Factors and Comorbidities Age;Time since  onset of injury/illness/exacerbation;Fitness    Examination-Activity Limitations Transfers    Examination-Participation Restrictions Cleaning    Stability/Clinical Decision Making Stable/Uncomplicated    Rehab Potential Good    PT Frequency 2x / week    PT Duration 6 weeks  PT Treatment/Interventions ADLs/Self Care Home Management;Cryotherapy;Electrical Stimulation;Moist Heat;Traction;Functional mobility training;Therapeutic activities;Therapeutic exercise;Balance training;Neuromuscular re-education;Patient/family education;Manual techniques;Passive range of motion;Dry needling;Taping;Spinal Manipulations    PT Next Visit Plan Assess ability to demonstrate correct HEP; Assess pain with updated HEP. Progress exercises to patient form fatigue; Add mirror to exercises to give visual feedback of performance.    PT Home Exercise Plan Access Code: [email or web address]    Consulted and Agree with Plan of Care Patient             Patient will benefit from skilled therapeutic intervention in order to improve the following deficits and impairments:  Decreased range of motion, Pain, Improper body mechanics, Hypomobility, Postural dysfunction, Decreased strength  Visit Diagnosis: No diagnosis found.     Problem List Patient Active Problem List   Diagnosis Date Noted   Ear itching 10/23/2020   Hyperthyroidism 12/17/2019   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   Type 2 diabetes mellitus with hyperglycemia, without long-term current use of insulin ([place]) 12/17/2019   Dyslipidemia 11/19/2019   Spinal stenosis of cervical region 10/11/2016    Class: Chronic   Cervical spinal stenosis 10/11/2016   Hypertension 09/09/2016   Diabetes mellitus ([place]) 09/09/2016   Multiple thyroid nodules 01/27/2013   Disorder of thyroid 07/07/2007   VARICOSE VEIN 07/07/2007   GERD 07/07/2007   Glade Lloyd, SPT 08/15/21 9:31 AM   [facility] [address] [place], [place], [address] Phone: [phone]   Fax:  [phone]  Name: Crystal [name] MRN: [id number] Date of Birth: Aug 26, 1961

## 2021-08-17 ENCOUNTER — Ambulatory Visit: Payer: 59 | Admitting: Physical Therapy

## 2021-08-17 ENCOUNTER — Other Ambulatory Visit: Payer: Self-pay

## 2021-08-17 DIAGNOSIS — R293 Abnormal posture: Secondary | ICD-10-CM

## 2021-08-17 DIAGNOSIS — G8929 Other chronic pain: Secondary | ICD-10-CM

## 2021-08-17 DIAGNOSIS — M6281 Muscle weakness (generalized): Secondary | ICD-10-CM

## 2021-08-17 DIAGNOSIS — M545 Low back pain, unspecified: Secondary | ICD-10-CM | POA: Diagnosis not present

## 2021-08-17 NOTE — Therapy (Signed)
[place], [place], [address] Phone: [phone]   Fax:  [phone]  Physical Therapy Treatment  Patient Details  Name: Crystal [name] TMLYYT MRN: [id number] Date of Birth: [birth year] Referring Provider (PT): Vevelyn Francois, NP   Encounter Date: 08/17/2021   PT End of Session - 08/17/21 1105     Visit Number 3    Number of Visits 13    Date for PT Re-Evaluation 09/15/21    Authorization Type [employer]    PT Start Time 1100    PT Stop Time 1150    PT Time Calculation (min) 50 min             Past Medical History:  Diagnosis Date   Allergy    Diabetes mellitus    GERD (gastroesophageal reflux disease)    Hyperlipidemia    Hypertension    Hyperthyroidism     Past Surgical History:  Procedure Laterality Date   ANTERIOR CERVICAL DECOMP/DISCECTOMY FUSION N/A 10/11/2016   Procedure: ANTERIOR CERVICAL DISCECTOMY FUSION C6-7 WITH EXCISION OF OSTEOPHYTE;  Surgeon: [name], MD;  Location: [place];  Service: Orthopedics;  Laterality: N/A;   CESAREAN SECTION     TUBAL LIGATION      There were no vitals filed for this visit.   Subjective Assessment - 08/17/21 1104     Subjective It is a little better.  Pain is 7/10 in AM, about a 4/10 right now. Sometimes it goes into L leg but not right now.    Currently in Pain? Yes    Pain Score 4     Pain Location Back    Pain Orientation Lower;Right;Left    Pain Descriptors / Indicators Aching    Pain Onset More than a month ago    Pain Frequency Intermittent                 [facility] Adult PT Treatment/Exercise:   Therapeutic Exercise: Standing extension x 10 Supine abdominal set x 10 Bridge x 10  Lower trunk rotation x 5 reset Lower core: ball under pelvis for alternating march and bent knee fall out x 10 each x 2 sets , dead bug modified on ball   Standing hip extension , abduction  X 15 cues for core and form    Squat x 15  Sit  to stand x 10   2 sets, no UE needed, used  10 lbs DB 2nd set  Walk/suitcase with 1 10 lbs DB, x 180 feet x 2    Manual Therapy:   Lt and Rt trunk  in side lying  Trigger point, compression to quadratus lumborum  Soft tissue work ;Prone lumbar paraspinals   Neuromuscular re-ed: N/A   Therapeutic Activity: N/A   Modalities: N/A   Self Care: N/A   Consider / progression for next session:            PT Short Term Goals - 07/31/21 1704       PT SHORT TERM GOAL #1   Title Patient will be independent with initial HEP.    Baseline issued at eval    Status New    Target Date 08/14/21      PT SHORT TERM GOAL #2   Title Patient will demonstrate knowledge and application of appropriate sitting and standing posture to reduce stress on her low back.    Baseline see posture assessment    Status New    Target Date 08/21/21  PT Long Term Goals - 07/31/21 1708       PT LONG TERM GOAL #1   Title Patient will be able to complete sit to stand transfer after laying down for extended period of time without need for assistance.    Baseline patient reports requiring assistance to transfer when she has been laying down or sitting for prolonged period due to severe back pain    Status New    Target Date 09/11/21      PT LONG TERM GOAL #2   Title Patient will demonstrate at least 4+/5 bilateral hip strength to improve stability about the chain with prolonged standing and walking.    Baseline see flowsheet    Status New    Target Date 09/11/21      PT LONG TERM GOAL #3   Title Patient will improve trunk flexion AROM by at least 20 cm when measured from fingertips to floor to improve ability to complete bending activity.    Baseline 50 cm from floor    Status New    Target Date 09/11/21      PT LONG TERM GOAL #4   Title Patient will improve lumbar extension AROM by at least 25% to improve ability to reach overhead with proper mechanics.    Baseline 75%  limited    Status New    Target Date 09/11/21                   Plan - 08/17/21 1106     Clinical Impression Statement Patient did not have pain during session. Manual therapy for release of lumbar and quadratus lumborum bilateral.  She was given quadruped for spine and hip mobility, stretching and she was very receptive.  Cont PC including loading and core strengthening.    PT Treatment/Interventions ADLs/Self Care Home Management;Cryotherapy;Electrical Stimulation;Moist Heat;Traction;Functional mobility training;Therapeutic activities;Therapeutic exercise;Balance training;Neuromuscular re-education;Patient/family education;Manual techniques;Passive range of motion;Dry needling;Taping;Spinal Manipulations    PT Next Visit Plan Progress exercises to patient form fatigue; Add mirror to exercises to give visual feedback of performance. quadruped.    PT Home Exercise Plan Access Code: [email or web address]    Consulted and Agree with Plan of Care Patient             Patient will benefit from skilled therapeutic intervention in order to improve the following deficits and impairments:  Decreased range of motion, Pain, Improper body mechanics, Hypomobility, Postural dysfunction, Decreased strength  Visit Diagnosis: Chronic bilateral low back pain without sciatica  Muscle weakness (generalized)  Abnormal posture     Problem List Patient Active Problem List   Diagnosis Date Noted   Ear itching 10/23/2020   Hyperthyroidism 12/17/2019   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   Type 2 diabetes mellitus with hyperglycemia, without long-term current use of insulin ([place]) 12/17/2019   Dyslipidemia 11/19/2019   Spinal stenosis of cervical region 10/11/2016    Class: Chronic   Cervical spinal stenosis 10/11/2016   Hypertension 09/09/2016   Diabetes mellitus ([place]) 09/09/2016   Multiple thyroid nodules 01/27/2013   Disorder of thyroid 07/07/2007   VARICOSE VEIN 07/07/2007   GERD 07/07/2007     Carlton Buskey, PT 08/17/2021, 12:03 PM  [place] [address] [place], [place], [address] Phone: [phone]   Fax:  [phone]  Name: Angeles Zehner MRN: [id number] Date of Birth: Feb 24, 1961   Raeford Razor, PT 08/17/21 12:03 PM Phone: [phone] Fax: [phone]

## 2021-08-20 ENCOUNTER — Other Ambulatory Visit (HOSPITAL_COMMUNITY): Payer: Self-pay

## 2021-08-20 MED ORDER — IBUPROFEN 800 MG PO TABS
ORAL_TABLET | ORAL | 0 refills | Status: DC
  Filled 2021-08-20: qty 14, 14d supply, fill #0

## 2021-08-20 MED FILL — Metformin HCl Tab 500 MG: ORAL | 90 days supply | Qty: 360 | Fill #1 | Status: AC

## 2021-08-20 MED FILL — Amlodipine Besylate Tab 10 MG (Base Equivalent): ORAL | 90 days supply | Qty: 90 | Fill #0 | Status: CN

## 2021-08-20 MED FILL — Sitagliptin Phosphate Tab 100 MG (Base Equiv): ORAL | 30 days supply | Qty: 30 | Fill #3 | Status: AC

## 2021-08-21 ENCOUNTER — Other Ambulatory Visit: Payer: Self-pay

## 2021-08-21 ENCOUNTER — Ambulatory Visit: Payer: 59

## 2021-08-21 DIAGNOSIS — M545 Low back pain, unspecified: Secondary | ICD-10-CM | POA: Diagnosis not present

## 2021-08-21 DIAGNOSIS — R293 Abnormal posture: Secondary | ICD-10-CM

## 2021-08-21 DIAGNOSIS — M6281 Muscle weakness (generalized): Secondary | ICD-10-CM

## 2021-08-21 DIAGNOSIS — G8929 Other chronic pain: Secondary | ICD-10-CM

## 2021-08-21 NOTE — Therapy (Addendum)
[name], [place], [address] Phone: [phone]   Fax:  [phone]  Physical Therapy Treatment  Patient Details  Name: Crystal [name] XKGYJE MRN: [id number] Date of Birth: [birth year] Referring Provider (PT): Vevelyn Francois, NP   Encounter Date: 08/21/2021   PT End of Session - 08/21/21 1712     Visit Number 4    Number of Visits 13    Date for PT Re-Evaluation 09/15/21    Authorization Type [employer]    PT Start Time 6378    PT Stop Time 5885    PT Time Calculation (min) 42 min    Activity Tolerance Patient tolerated treatment well    Behavior During Therapy [facility] for tasks assessed/performed             Past Medical History:  Diagnosis Date   Allergy    Diabetes mellitus    GERD (gastroesophageal reflux disease)    Hyperlipidemia    Hypertension    Hyperthyroidism     Past Surgical History:  Procedure Laterality Date   ANTERIOR CERVICAL DECOMP/DISCECTOMY FUSION N/A 10/11/2016   Procedure: ANTERIOR CERVICAL DISCECTOMY FUSION C6-7 WITH EXCISION OF OSTEOPHYTE;  Surgeon: [name], MD;  Location: [place];  Service: Orthopedics;  Laterality: N/A;   CESAREAN SECTION     TUBAL LIGATION      There were no vitals filed for this visit.   Subjective Assessment - 08/21/21 1539     Subjective Pt says she feels alot better and can tell she is getting much better. Patient says she has been doing her HEP at home.    Currently in Pain? No/denies    Pain Score --    Pain Location --    Pain Orientation --    Pain Descriptors / Indicators --    Pain Onset --              [facility] Adult PT Treatment/Exercise:   Therapeutic Exercise: Standing hip extension and abduction 2 x 10 bilat SLS 2x10 on each leg. Holds for as long as possible.  Bridge  2 x 10  Lower trunk rotation 1x10 alternating sides  Lower core: ball roll out with leg extension 3x10  Sit to stand 2x12  Palof press yellow  band 2x10 both sides  Shoulder extension yellow band 2x12 Trunk rotations seated with yellow weighted ball, 1x15 Seated marching with PPT 2x20  March in bridge with PPT 1x20 alternating Deadbug holds 1x10s, 10sec holds.    *not performed today:  Squat x 15  2 sets, no UE needed, used  10 lbs DB 2nd set  Walk/suitcase with 1 10 lbs DB, x 180 feet x 2      Manual Therapy:  *not performed today Lt and Rt trunk  in side lying  Trigger point, compression to quadratus lumborum  Soft tissue work ;Prone lumbar paraspinals    Neuromuscular re-ed: N/A   Therapeutic Activity: N/A   Modalities: N/A   Self Care: N/A        [facility] PT Assessment - 08/22/21 0001       Posture/Postural Control   Posture Comments seated posture: neutral lordosis and pelvic tilt                                      PT Short Term Goals - 08/21/21 1720  PT SHORT TERM GOAL #1   Title Patient will be independent with initial HEP.    Baseline issued at eval    Status Achieved    Target Date 08/14/21      PT SHORT TERM GOAL #2   Title Patient will demonstrate knowledge and application of appropriate sitting and standing posture to reduce stress on her low back.    Baseline see posture assessment    Status On-going    Target Date 08/21/21               PT Long Term Goals - 07/31/21 1708       PT LONG TERM GOAL #1   Title Patient will be able to complete sit to stand transfer after laying down for extended period of time without need for assistance.    Baseline patient reports requiring assistance to transfer when she has been laying down or sitting for prolonged period due to severe back pain    Status New    Target Date 09/11/21      PT LONG TERM GOAL #2   Title Patient will demonstrate at least 4+/5 bilateral hip strength to improve stability about the chain with prolonged standing and walking.    Baseline see flowsheet    Status New    Target Date  09/11/21      PT LONG TERM GOAL #3   Title Patient will improve trunk flexion AROM by at least 20 cm when measured from fingertips to floor to improve ability to complete bending activity.    Baseline 50 cm from floor    Status New    Target Date 09/11/21      PT LONG TERM GOAL #4   Title Patient will improve lumbar extension AROM by at least 25% to improve ability to reach overhead with proper mechanics.    Baseline 75% limited    Status New    Target Date 09/11/21                   Plan - 08/21/21 1713     Clinical Impression Statement Patient tolerated todays treatment session well with no adverse effects. Pt reports continued adherence to HEP and that it "feels good". Pt meets HEP short term goal but the other short term goal is ongoing due to patient ability to correct posture when cued, but continued difficulty in modifying posture without being prompted. Moderate verbal and tactile cues given throughout all procedures for form and technique, patient able to make corrections to form with cues. Able to progress trunk stabilization exercises with no increased pain. Pt reports no pain at the end of the session.    PT Treatment/Interventions ADLs/Self Care Home Management;Cryotherapy;Electrical Stimulation;Moist Heat;Traction;Functional mobility training;Therapeutic activities;Therapeutic exercise;Balance training;Neuromuscular re-education;Patient/family education;Manual techniques;Passive range of motion;Dry needling;Taping;Spinal Manipulations    PT Next Visit Plan Progress exercises to patient form fatigue; Add mirror to exercises to give visual feedback of performance.    PT Home Exercise Plan Access Code: [email or web address]    Consulted and Agree with Plan of Care Patient             Patient will benefit from skilled therapeutic intervention in order to improve the following deficits and impairments:  Decreased range of motion, Pain, Improper body mechanics, Hypomobility,  Postural dysfunction, Decreased strength  Visit Diagnosis: No diagnosis found.     Problem List Patient Active Problem List   Diagnosis Date Noted   Ear itching 10/23/2020   Hyperthyroidism 12/17/2019  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   Type 2 diabetes mellitus with hyperglycemia, without long-term current use of insulin ([place]) 12/17/2019   Dyslipidemia 11/19/2019   Spinal stenosis of cervical region 10/11/2016    Class: Chronic   Cervical spinal stenosis 10/11/2016   Hypertension 09/09/2016   Diabetes mellitus ([place]) 09/09/2016   Multiple thyroid nodules 01/27/2013   Disorder of thyroid 07/07/2007   VARICOSE VEIN 07/07/2007   GERD 07/07/2007   Glade Lloyd, SPT 08/22/21 9:08 AM   [facility] [address] [place], [place], [address] Phone: [phone]   Fax:  [phone]  Name: Crystal [name] MRN: [id number] Date of Birth: 03-28-1961

## 2021-08-23 ENCOUNTER — Other Ambulatory Visit: Payer: Self-pay

## 2021-08-23 ENCOUNTER — Ambulatory Visit: Payer: 59

## 2021-08-23 DIAGNOSIS — M545 Low back pain, unspecified: Secondary | ICD-10-CM

## 2021-08-23 DIAGNOSIS — M6281 Muscle weakness (generalized): Secondary | ICD-10-CM

## 2021-08-23 DIAGNOSIS — R293 Abnormal posture: Secondary | ICD-10-CM

## 2021-08-23 NOTE — Therapy (Addendum)
[place], [place], [address] Phone: [phone]   Fax:  [phone]  Physical Therapy Treatment  Patient Details  Name: Crystal [name] FIEPPI MRN: [id number] Date of Birth: 1961-05-10 Referring Provider (PT): Vevelyn Francois, NP   Encounter Date: 08/23/2021   PT End of Session - 08/23/21 1626     Visit Number 5    Number of Visits 13    Date for PT Re-Evaluation 09/15/21    Authorization Type [employer]    PT Start Time 0630   session limited due to pt being late   PT Stop Time 1615    PT Time Calculation (min) 34 min    Activity Tolerance Patient tolerated treatment well    Behavior During Therapy [facility] for tasks assessed/performed             Past Medical History:  Diagnosis Date   Allergy    Diabetes mellitus    GERD (gastroesophageal reflux disease)    Hyperlipidemia    Hypertension    Hyperthyroidism     Past Surgical History:  Procedure Laterality Date   ANTERIOR CERVICAL DECOMP/DISCECTOMY FUSION N/A 10/11/2016   Procedure: ANTERIOR CERVICAL DISCECTOMY FUSION C6-7 WITH EXCISION OF OSTEOPHYTE;  Surgeon: [name], MD;  Location: [place];  Service: Orthopedics;  Laterality: N/A;   CESAREAN SECTION     TUBAL LIGATION      There were no vitals filed for this visit.   Subjective Assessment - 08/23/21 1624     Subjective Pt says she was late to todays session due to traffic and an accident on the street. Pt says she will come early to her next physical therapy appointment.    Currently in Pain? No/denies                                   [facility] Adult PT Treatment/Exercise:   Therapeutic Exercise:  Bridge  2 x 10 w/o band  Bridge 3x10 with red band  Lower trunk rotation 1x20 alternating sides  Sit to stand 2x10 Palof press yellow band 2x10 both sides  Shoulder extension yellow band 2x12 Trunk rotations seated with yellow weighted ball, 1x12,  discontinued due to inability to understand exercise  Deadbug LE only 2x10   Clam shells (hip abduction with knee flexed in sidelying) 2x10 each side.  Leg press 3x10 25lbs  Updated HEP  *not performed today:  Squat x 15  2 sets, no UE needed, used  10 lbs DB 2nd set  Walk/suitcase with 1 10 lbs DB, x 180 feet x 2  Lower core: ball roll out with leg extension 3x10  Seated marching with PPT 2x20  March in bridge with PPT 1x20 alternating Standing hip extension and abduction 2 x 10 bilat   Manual Therapy:  *not performed today Lt and Rt trunk  in side lying  Trigger point, compression to quadratus lumborum  Soft tissue work ;Prone lumbar paraspinals    Neuromuscular re-ed: N/A   Therapeutic Activity: N/A   Modalities: N/A   Self Care: N/A       PT Short Term Goals - 08/21/21 1720       PT SHORT TERM GOAL #1   Title Patient will be independent with initial HEP.    Baseline issued at eval    Status Achieved    Target Date 08/14/21      PT SHORT  TERM GOAL #2   Title Patient will demonstrate knowledge and application of appropriate sitting and standing posture to reduce stress on her low back.    Baseline see posture assessment    Status On-going    Target Date 08/21/21               PT Long Term Goals - 07/31/21 1708       PT LONG TERM GOAL #1   Title Patient will be able to complete sit to stand transfer after laying down for extended period of time without need for assistance.    Baseline patient reports requiring assistance to transfer when she has been laying down or sitting for prolonged period due to severe back pain    Status New    Target Date 09/11/21      PT LONG TERM GOAL #2   Title Patient will demonstrate at least 4+/5 bilateral hip strength to improve stability about the chain with prolonged standing and walking.    Baseline see flowsheet    Status New    Target Date 09/11/21      PT LONG TERM GOAL #3   Title Patient will improve  trunk flexion AROM by at least 20 cm when measured from fingertips to floor to improve ability to complete bending activity.    Baseline 50 cm from floor    Status New    Target Date 09/11/21      PT LONG TERM GOAL #4   Title Patient will improve lumbar extension AROM by at least 25% to improve ability to reach overhead with proper mechanics.    Baseline 75% limited    Status New    Target Date 09/11/21                   Plan - 08/23/21 1558     Clinical Impression Statement Patient tolerated todays treatment session well with no adverse effects, but was limited due to patient being late. Discussed with patient the limitations of arriving late to therapy session and troubleshooting options for arriving on time. Pt reports continued adherence to HEP. Able to add specific exercises for targetting hip abductors with moderate fatigue and no reports of increased pain. Moderate verbal and tactile cues given throughout all procedures for form and technique, patient able to make corrections.    PT Treatment/Interventions ADLs/Self Care Home Management;Cryotherapy;Electrical Stimulation;Moist Heat;Traction;Functional mobility training;Therapeutic activities;Therapeutic exercise;Balance training;Neuromuscular re-education;Patient/family education;Manual techniques;Passive range of motion;Dry needling;Taping;Spinal Manipulations    PT Next Visit Plan Progress exercises to patient form fatigue; Add mirror to exercises to give visual feedback of performance.    PT Home Exercise Plan Access Code: [email or web address]    Consulted and Agree with Plan of Care Patient             Patient will benefit from skilled therapeutic intervention in order to improve the following deficits and impairments:  Decreased range of motion, Pain, Improper body mechanics, Hypomobility, Postural dysfunction, Decreased strength  Visit Diagnosis: No diagnosis found.     Problem List Patient Active Problem List    Diagnosis Date Noted   Ear itching 10/23/2020   Hyperthyroidism 12/17/2019   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   Type 2 diabetes mellitus with hyperglycemia, without long-term current use of insulin ([place]) 12/17/2019   Dyslipidemia 11/19/2019   Spinal stenosis of cervical region 10/11/2016    Class: Chronic   Cervical spinal stenosis 10/11/2016   Hypertension 09/09/2016   Diabetes mellitus ([place]) 09/09/2016   Multiple thyroid  nodules 01/27/2013   Disorder of thyroid 07/07/2007   VARICOSE VEIN 07/07/2007   GERD 07/07/2007   Glade Lloyd, SPT 08/23/21 4:30 PM   [facility] [address] [place], [place], [address] Phone: [phone]   Fax:  [phone]  Name: Crystal [name] MRN: [id number] Date of Birth: 24-Jul-1961

## 2021-08-27 ENCOUNTER — Other Ambulatory Visit: Payer: Self-pay

## 2021-08-27 ENCOUNTER — Encounter: Payer: Self-pay | Admitting: Physical Therapy

## 2021-08-27 ENCOUNTER — Ambulatory Visit: Payer: 59 | Admitting: Physical Therapy

## 2021-08-27 DIAGNOSIS — M545 Low back pain, unspecified: Secondary | ICD-10-CM | POA: Diagnosis not present

## 2021-08-27 DIAGNOSIS — R293 Abnormal posture: Secondary | ICD-10-CM

## 2021-08-27 DIAGNOSIS — M6281 Muscle weakness (generalized): Secondary | ICD-10-CM

## 2021-08-27 DIAGNOSIS — G8929 Other chronic pain: Secondary | ICD-10-CM

## 2021-08-27 NOTE — Therapy (Signed)
[place], [place], [address] Phone: [phone]   Fax:  [phone]  Physical Therapy Treatment  Patient Details  Name: Crystal [name] ZJQBHA MRN: [id number] Date of Birth: [birth year] Referring Provider (PT): Vevelyn Francois, NP   Encounter Date: 08/27/2021   PT End of Session - 08/27/21 1508     Visit Number 6    Number of Visits 13    Date for PT Re-Evaluation 09/15/21    Authorization Type [employer]    PT Start Time 1505    PT Stop Time 9735    PT Time Calculation (min) 39 min    Activity Tolerance Patient tolerated treatment well    Behavior During Therapy [facility] for tasks assessed/performed             Past Medical History:  Diagnosis Date   Allergy    Diabetes mellitus    GERD (gastroesophageal reflux disease)    Hyperlipidemia    Hypertension    Hyperthyroidism     Past Surgical History:  Procedure Laterality Date   ANTERIOR CERVICAL DECOMP/DISCECTOMY FUSION N/A 10/11/2016   Procedure: ANTERIOR CERVICAL DISCECTOMY FUSION C6-7 WITH EXCISION OF OSTEOPHYTE;  Surgeon: [name], MD;  Location: [place];  Service: Orthopedics;  Laterality: N/A;   CESAREAN SECTION     TUBAL LIGATION      There were no vitals filed for this visit.   Subjective Assessment - 08/27/21 1507     Subjective Pain is mild today, 3/10.  She thinks her back pain is getting better.  She has pain when she lays down at night.    Diagnostic tests Lumbar X-ray IMPRESSION:  No fracture or dislocation of the lumbar spine. Moderate multilevel  disc space height loss and osteophytosis, as well as mild multilevel  facet degenerative change, worst at the lower lumbar levels. Lumbar  disc and neural foraminal pathology may be further evaluated by MRI  if indicated by neurologically localizing signs and symptoms.    Currently in Pain? Yes    Pain Score 3     Pain Location Back    Pain Orientation Lower    Pain Descriptors  / Indicators Aching;Tightness    Pain Type Chronic pain    Pain Onset More than a month ago    Pain Frequency Intermittent    Aggravating Factors  overactivity, standing, bending, sleeping    Pain Relieving Factors meds, oil and stretching              [facility] Adult PT Treatment/Exercise:   Therapeutic Exercise: Squat x 15  Split squat x 10  Hip abduction x 15 cues  Step ups using hip machine x 15  Leg press 1 plate x 2 x 15 reps cues for knees collapsing inward  Supine core: isometric hold with towel under hips Alt. Toe taps Alt. Knee extension  Bridging 2 x 10 Quadruped UE x 5 , LE x 5 and bird dog x 8      Manual Therapy:   Neuromuscular re-ed: N/A   Therapeutic Activity: N/A   Modalities: N/A   Self Care: N/A   Consider / progression for next session:              PT Short Term Goals - 08/21/21 1720       PT SHORT TERM GOAL #1   Title Patient will be independent with initial HEP.    Baseline issued at eval    Status Achieved  Target Date 08/14/21      PT SHORT TERM GOAL #2   Title Patient will demonstrate knowledge and application of appropriate sitting and standing posture to reduce stress on her low back.    Baseline see posture assessment    Status On-going    Target Date 08/21/21               PT Long Term Goals - 08/27/21 1509       PT LONG TERM GOAL #1   Title Patient will be able to complete sit to stand transfer after laying down for extended period of time without need for assistance.    Status Achieved      PT LONG TERM GOAL #2   Title Patient will demonstrate at least 4+/5 bilateral hip strength to improve stability about the chain with prolonged standing and walking.      PT LONG TERM GOAL #3   Title Patient will improve trunk flexion AROM by at least 20 cm when measured from fingertips to floor to improve ability to complete bending activity.    Status Achieved      PT LONG TERM GOAL #4   Title Patient will  improve lumbar extension AROM by at least 25% to improve ability to reach overhead with proper mechanics.    Baseline 25%    Status Achieved                   Plan - 08/27/21 1524     Clinical Impression Statement Patient was able to tolerated strengthening exercies without increase in back pain .  She needs cues for alignemt and to slow, con trol the movement.  Tends to flex through her lumbar spine with core work. Heavy cues needed thorughout due to language and motor learning deficits.    PT Treatment/Interventions ADLs/Self Care Home Management;Cryotherapy;Electrical Stimulation;Moist Heat;Traction;Functional mobility training;Therapeutic activities;Therapeutic exercise;Balance training;Neuromuscular re-education;Patient/family education;Manual techniques;Passive range of motion;Dry needling;Taping;Spinal Manipulations    PT Next Visit Plan Progress exercises to patient form fatigue; Add mirror to exercises to give visual feedback of performance. CORE>  SIMPLIFY    PT Home Exercise Plan Access Code: [email or web address]    Consulted and Agree with Plan of Care Patient             Patient will benefit from skilled therapeutic intervention in order to improve the following deficits and impairments:  Decreased range of motion, Pain, Improper body mechanics, Hypomobility, Postural dysfunction, Decreased strength  Visit Diagnosis: Chronic bilateral low back pain without sciatica  Muscle weakness (generalized)  Abnormal posture     Problem List Patient Active Problem List   Diagnosis Date Noted   Ear itching 10/23/2020   Hyperthyroidism 12/17/2019   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   Type 2 diabetes mellitus with hyperglycemia, without long-term current use of insulin ([place]) 12/17/2019   Dyslipidemia 11/19/2019   Spinal stenosis of cervical region 10/11/2016    Class: Chronic   Cervical spinal stenosis 10/11/2016   Hypertension 09/09/2016   Diabetes mellitus ([place]) 09/09/2016    Multiple thyroid nodules 01/27/2013   Disorder of thyroid 07/07/2007   VARICOSE VEIN 07/07/2007   GERD 07/07/2007    Kamree Wiens, PT 08/27/2021, 3:44 PM  [facility] [address] [place], [place], [address] Phone: [phone]   Fax:  [phone]  Name: Crystal [name] MRN: [id number] Date of Birth: [birth year]  Raeford Razor, PT 08/27/21 3:46 PM Phone: [phone] Fax: [phone]

## 2021-08-29 ENCOUNTER — Ambulatory Visit: Payer: 59

## 2021-09-04 ENCOUNTER — Ambulatory Visit: Payer: 59

## 2021-09-04 ENCOUNTER — Other Ambulatory Visit: Payer: Self-pay

## 2021-09-04 DIAGNOSIS — M6281 Muscle weakness (generalized): Secondary | ICD-10-CM

## 2021-09-04 DIAGNOSIS — M545 Low back pain, unspecified: Secondary | ICD-10-CM | POA: Diagnosis not present

## 2021-09-04 DIAGNOSIS — R293 Abnormal posture: Secondary | ICD-10-CM

## 2021-09-04 DIAGNOSIS — G8929 Other chronic pain: Secondary | ICD-10-CM

## 2021-09-04 NOTE — Therapy (Addendum)
[place], [place], [address] Phone: [phone]   Fax:  [phone]  Physical Therapy Treatment  Patient Details  Name: Crystal [name] YBOFBP MRN: [id number] Date of Birth: 1961-06-11 Referring Provider (PT): Vevelyn Francois, NP   Encounter Date: 09/04/2021   PT End of Session - 09/04/21 1621     Visit Number 7    Number of Visits 13    Date for PT Re-Evaluation 09/15/21    Authorization Type [employer]    PT Start Time 8242    PT Stop Time 3536    PT Time Calculation (min) 42 min    Activity Tolerance Patient tolerated treatment well    Behavior During Therapy [facility] for tasks assessed/performed             Past Medical History:  Diagnosis Date   Allergy    Diabetes mellitus    GERD (gastroesophageal reflux disease)    Hyperlipidemia    Hypertension    Hyperthyroidism     Past Surgical History:  Procedure Laterality Date   ANTERIOR CERVICAL DECOMP/DISCECTOMY FUSION N/A 10/11/2016   Procedure: ANTERIOR CERVICAL DISCECTOMY FUSION C6-7 WITH EXCISION OF OSTEOPHYTE;  Surgeon: [name], MD;  Location: [place];  Service: Orthopedics;  Laterality: N/A;   CESAREAN SECTION     TUBAL LIGATION      There were no vitals filed for this visit.   Subjective Assessment - 09/04/21 1537     Subjective Pt reports a little bit of pain in her back today, but that it is the worst at night. Pt says she has trouble sleeping due to her back pain.    Diagnostic tests Lumbar X-ray IMPRESSION:  No fracture or dislocation of the lumbar spine. Moderate multilevel  disc space height loss and osteophytosis, as well as mild multilevel  facet degenerative change, worst at the lower lumbar levels. Lumbar  disc and neural foraminal pathology may be further evaluated by MRI  if indicated by neurologically localizing signs and symptoms.    Currently in Pain? Yes    Pain Score 3     Pain Location Back    Pain Orientation  Lower    Pain Descriptors / Indicators Aching;Tightness    Pain Type Chronic pain    Pain Onset More than a month ago    Pain Frequency Intermittent    Aggravating Factors  overactivity, standing, bending, sleeping    Pain Relieving Factors meds, oil, and stretching           [facility] Adult PT Treatment/Exercise:  Therapeutic Exercise: - Deadbug holds LE only 10x3secs - Bridges 2x10  - Single leg raise 2x10 bilat - Squat with 10lb weight 2x10  - Farmers carries 5lbs KB each hand 4 laps length of gym - Farmers carries 10 lbs KB 4 laps length of gym  - Leg press 25lbs 3x10  - Picking up 5lb KB from waist height and from floor 2x10  Manual Therapy: - NA  Neuromuscular re-ed: - NA  Therapeutic Activity: - NA  Self-care/Home Management: - Educated on positional changes for pain relief during sleep       [facility] PT Assessment - 09/04/21 0001       Functional Tests   Functional tests Other      Other:   Other/ Comments Pick up object: WFL no pain or difficulty      AROM   Lumbar Flexion fingertips to floor   no pain  Strength   Right Hip Flexion 3+/5    Right Hip Extension 4/5    Right Hip ABduction 4-/5    Left Hip Flexion 3+/5    Left Hip Extension 4-/5    Left Hip ABduction 4/5                                      PT Short Term Goals - 08/21/21 1720       PT SHORT TERM GOAL #1   Title Patient will be independent with initial HEP.    Baseline issued at eval    Status Achieved    Target Date 08/14/21      PT SHORT TERM GOAL #2   Title Patient will demonstrate knowledge and application of appropriate sitting and standing posture to reduce stress on her low back.    Baseline see posture assessment    Status On-going    Target Date 08/21/21               PT Long Term Goals - 08/27/21 1509       PT LONG TERM GOAL #1   Title Patient will be able to complete sit to stand transfer after laying down for extended period  of time without need for assistance.    Status Achieved      PT LONG TERM GOAL #2   Title Patient will demonstrate at least 4+/5 bilateral hip strength to improve stability about the chain with prolonged standing and walking.      PT LONG TERM GOAL #3   Title Patient will improve trunk flexion AROM by at least 20 cm when measured from fingertips to floor to improve ability to complete bending activity.    Status Achieved      PT LONG TERM GOAL #4   Title Patient will improve lumbar extension AROM by at least 25% to improve ability to reach overhead with proper mechanics.    Baseline 25%    Status Achieved                   Plan - 09/04/21 1610     Clinical Impression Statement Patient tolerated todays treatment session well without increased back pain. Pt continues to have strength and endurance deficits of bilat hips, but has made improvements with painfree movement into trunk flexion, see flowsheet. Pt required heavy cues for form and technique of core exercises due to language and motor control deficits. Pt demonstrated significant difficulty and fatigue with single leg raise and deadbug holds.    PT Treatment/Interventions ADLs/Self Care Home Management;Cryotherapy;Electrical Stimulation;Moist Heat;Traction;Functional mobility training;Therapeutic activities;Therapeutic exercise;Balance training;Neuromuscular re-education;Patient/family education;Manual techniques;Passive range of motion;Dry needling;Taping;Spinal Manipulations    PT Next Visit Plan Progress exercises to patient form fatigue; Add mirror to exercises to give visual feedback of performance. CORE>  SIMPLIFY; Consider discharge and progressing HEP.    PT Home Exercise Plan Access Code: [email or web address]    Consulted and Agree with Plan of Care Patient             Patient will benefit from skilled therapeutic intervention in order to improve the following deficits and impairments:  Decreased range of motion, Pain,  Improper body mechanics, Hypomobility, Postural dysfunction, Decreased strength  Visit Diagnosis: No diagnosis found.     Problem List Patient Active Problem List   Diagnosis Date Noted   Ear itching 10/23/2020   Hyperthyroidism 12/17/2019   Toxic  multinodular goiter 12/17/2019   Type 2 diabetes mellitus with hyperglycemia, without long-term current use of insulin ([place]) 12/17/2019   Dyslipidemia 11/19/2019   Spinal stenosis of cervical region 10/11/2016    Class: Chronic   Cervical spinal stenosis 10/11/2016   Hypertension 09/09/2016   Diabetes mellitus ([place]) 09/09/2016   Multiple thyroid nodules 01/27/2013   Disorder of thyroid 07/07/2007   VARICOSE VEIN 07/07/2007   GERD 07/07/2007    Glade Lloyd, SPT 09/04/21 5:01 PM   [facility] [address] [place], [place], [address] Phone: [phone]   Fax:  [phone]  Name: Latiesha Harada MRN: [id number] Date of Birth: October 10, 1960

## 2021-09-06 ENCOUNTER — Ambulatory Visit: Payer: 59 | Attending: Nurse Practitioner

## 2021-09-06 ENCOUNTER — Other Ambulatory Visit: Payer: Self-pay

## 2021-09-06 DIAGNOSIS — G8929 Other chronic pain: Secondary | ICD-10-CM | POA: Insufficient documentation

## 2021-09-06 DIAGNOSIS — M545 Low back pain, unspecified: Secondary | ICD-10-CM | POA: Insufficient documentation

## 2021-09-06 DIAGNOSIS — M6281 Muscle weakness (generalized): Secondary | ICD-10-CM | POA: Diagnosis present

## 2021-09-06 DIAGNOSIS — R293 Abnormal posture: Secondary | ICD-10-CM | POA: Diagnosis present

## 2021-09-06 NOTE — Therapy (Addendum)
[place], [place], [address] Phone: [phone]   Fax:  [phone]  Physical Therapy Treatment  Patient Details  Name: Crystal [name] [id number] MRN: [id number] Date of Birth: April 19, 1961 Referring Provider (PT): Vevelyn Francois, NP   Encounter Date: 09/06/2021   PT End of Session - 09/06/21 1539     Visit Number 8    Number of Visits 13    Date for PT Re-Evaluation 09/15/21    Authorization Type [employer]    PT Start Time 6712    PT Stop Time 4580    PT Time Calculation (min) 45 min    Activity Tolerance Patient tolerated treatment well    Behavior During Therapy [facility] for tasks assessed/performed             Past Medical History:  Diagnosis Date   Allergy    Diabetes mellitus    GERD (gastroesophageal reflux disease)    Hyperlipidemia    Hypertension    Hyperthyroidism     Past Surgical History:  Procedure Laterality Date   ANTERIOR CERVICAL DECOMP/DISCECTOMY FUSION N/A 10/11/2016   Procedure: ANTERIOR CERVICAL DISCECTOMY FUSION C6-7 WITH EXCISION OF OSTEOPHYTE;  Surgeon: [name], MD;  Location: [place];  Service: Orthopedics;  Laterality: N/A;   CESAREAN SECTION     TUBAL LIGATION      There were no vitals filed for this visit.   Subjective Assessment - 09/06/21 1535     Subjective Pt says that she was able to using pillows for positioning to help with LBP during sleep and she was able to sleep without pain.    Diagnostic tests Lumbar X-ray IMPRESSION:  No fracture or dislocation of the lumbar spine. Moderate multilevel  disc space height loss and osteophytosis, as well as mild multilevel  facet degenerative change, worst at the lower lumbar levels. Lumbar  disc and neural foraminal pathology may be further evaluated by MRI  if indicated by neurologically localizing signs and symptoms.    Currently in Pain? No/denies           [facility] Adult PT Treatment/Exercise:   Therapeutic  Exercise: - Bike 5 minutes L1 - standing rows red band 1x10  - standing shoulder extension yellow band 1x20 yellow band, 1x20 red band   - Deadbug holds LE only 10x3secs - standing hip extension 1x15 bilat  - standing palof red band 1x15 each side  - Single leg raise 2x10 bilat - sidelying hip abduction 2x10 bilat  - prone hip extension 1x10 each leg.     Not performed today: - Squat with 10lb weight 2x10  - Farmers carries 5lbs KB each hand 4 laps length of gym - Farmers carries 10 lbs KB 4 laps length of gym  - Leg press 25lbs 3x10  - Picking up 5lb KB from waist height and from floor 2x10 - Bridges 2x10   Manual Therapy: - NA   Neuromuscular re-ed: - SLS 10x10secs bilat - tandem stance 1x30secs bilat - double leg stance on airex EC 3x30secs     Therapeutic Activity: - NA   Self-care/Home Management: - NA                               PT Short Term Goals - 09/06/21 1709       PT SHORT TERM GOAL #1   Title Patient will be independent with initial HEP.  Baseline issued at eval    Status Achieved    Target Date 08/14/21      PT SHORT TERM GOAL #2   Title Patient will demonstrate knowledge and application of appropriate sitting and standing posture to reduce stress on her low back.    Baseline posture cues continue to be required during session    Status On-going    Target Date 08/21/21               PT Long Term Goals - 08/27/21 1509       PT LONG TERM GOAL #1   Title Patient will be able to complete sit to stand transfer after laying down for extended period of time without need for assistance.    Status Achieved      PT LONG TERM GOAL #2   Title Patient will demonstrate at least 4+/5 bilateral hip strength to improve stability about the chain with prolonged standing and walking.      PT LONG TERM GOAL #3   Title Patient will improve trunk flexion AROM by at least 20 cm when measured from fingertips to floor to improve  ability to complete bending activity.    Status Achieved      PT LONG TERM GOAL #4   Title Patient will improve lumbar extension AROM by at least 25% to improve ability to reach overhead with proper mechanics.    Baseline 25%    Status Achieved                   Plan - 09/06/21 1627     Clinical Impression Statement Patient tolerated todays treatment session well without increased back pain and moderate muscle fatigue reported throughout. Todays session focused on progressing HEP and assessing adherence to form and technique with advanced HEP. Pt conts to report difficulty and significant fatigue with abdominal exercises and requires heavy cues for maintaining stable posture throughout all exercises. Pt denies any pain at the end of session.    PT Treatment/Interventions ADLs/Self Care Home Management;Cryotherapy;Electrical Stimulation;Moist Heat;Traction;Functional mobility training;Therapeutic activities;Therapeutic exercise;Balance training;Neuromuscular re-education;Patient/family education;Manual techniques;Passive range of motion;Dry needling;Taping;Spinal Manipulations    PT Next Visit Plan Progress exercises to patient form fatigue; Add mirror to exercises to give visual feedback of performance. CORE>  SIMPLIFY; Consider discharge and progressing HEP.    PT Home Exercise Plan Access Code: [email or web address]    Consulted and Agree with Plan of Care Patient             Patient will benefit from skilled therapeutic intervention in order to improve the following deficits and impairments:  Decreased range of motion, Pain, Improper body mechanics, Hypomobility, Postural dysfunction, Decreased strength  Visit Diagnosis: No diagnosis found.     Problem List Patient Active Problem List   Diagnosis Date Noted   Ear itching 10/23/2020   Hyperthyroidism 12/17/2019   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   Type 2 diabetes mellitus with hyperglycemia, without long-term current use of  insulin ([place]) 12/17/2019   Dyslipidemia 11/19/2019   Spinal stenosis of cervical region 10/11/2016    Class: Chronic   Cervical spinal stenosis 10/11/2016   Hypertension 09/09/2016   Diabetes mellitus ([place]) 09/09/2016   Multiple thyroid nodules 01/27/2013   Disorder of thyroid 07/07/2007   VARICOSE VEIN 07/07/2007   GERD 07/07/2007    Glade Lloyd, SPT 09/06/21 5:10 PM   [facility] [address] [place], [place], [address] Phone: [phone]   Fax:  [phone]  Name:  Kailan Carmen MRN: [id number] Date of Birth: [birth year]

## 2021-09-07 ENCOUNTER — Ambulatory Visit (INDEPENDENT_AMBULATORY_CARE_PROVIDER_SITE_OTHER): Payer: 59 | Admitting: Orthopaedic Surgery

## 2021-09-07 ENCOUNTER — Other Ambulatory Visit (HOSPITAL_BASED_OUTPATIENT_CLINIC_OR_DEPARTMENT_OTHER): Payer: Self-pay

## 2021-09-07 DIAGNOSIS — G8929 Other chronic pain: Secondary | ICD-10-CM

## 2021-09-07 DIAGNOSIS — M545 Low back pain, unspecified: Secondary | ICD-10-CM | POA: Diagnosis not present

## 2021-09-07 MED ORDER — METHOCARBAMOL 500 MG PO TABS
500.0000 mg | ORAL_TABLET | Freq: Two times a day (BID) | ORAL | 0 refills | Status: DC
  Filled 2021-09-07: qty 28, 14d supply, fill #0

## 2021-09-07 NOTE — Progress Notes (Signed)
Chief Complaint: Lower back pain     History of Present Illness:   Crystal [name] is a 60 y.o. female presents today with continued lower back pain has been chronic in nature.  This is mostly bothering her at night.  She was recently seen in the emergency room in September 2022 and sent for further referral.  She has previously seen Dr. Louanne Skye for this pain.  She states that Tylenol and ibuprofen are helping.  She states that there is a muscle cramping component of it.    Surgical History:   None  PMH/PSH/Family History/Social History/Meds/Allergies:    Past Medical History:  Diagnosis Date   Allergy    Diabetes mellitus    GERD (gastroesophageal reflux disease)    Hyperlipidemia    Hypertension    Hyperthyroidism    Past Surgical History:  Procedure Laterality Date   ANTERIOR CERVICAL DECOMP/DISCECTOMY FUSION N/A 10/11/2016   Procedure: ANTERIOR CERVICAL DISCECTOMY FUSION C6-7 WITH EXCISION OF OSTEOPHYTE;  Surgeon: [name], MD;  Location: [place];  Service: Orthopedics;  Laterality: N/A;   CESAREAN SECTION     TUBAL LIGATION     Social History   Socioeconomic History   Marital status: Married    Spouse name: Not on file   Number of children: Not on file   Years of education: Not on file   Highest education level: Not on file  Occupational History   Not on file  Tobacco Use   Smoking status: Never   Smokeless tobacco: Never  Substance and Sexual Activity   Alcohol use: No   Drug use: No   Sexual activity: Not on file  Other Topics Concern   Not on file  Social History Narrative   Not on file   Social Determinants of Health   Financial Resource Strain: Not on file  Food Insecurity: Not on file  Transportation Needs: Not on file  Physical Activity: Not on file  Stress: Not on file  Social Connections: Not on file   Family History  Problem Relation Age of Onset   Colon cancer Neg Hx    Colon polyps Neg Hx     Esophageal cancer Neg Hx    Rectal cancer Neg Hx    Stomach cancer Neg Hx    Allergies  Allergen Reactions   Aspirin Nausea And Vomiting   Tramadol     Upset stomach    Current Outpatient Medications  Medication Sig Dispense Refill   methocarbamol (ROBAXIN) 500 MG tablet Take 1 tablet (500 mg total) by mouth in the morning and at bedtime. 28 tablet 0   acetaminophen (TYLENOL) 500 MG tablet Take 1,000 mg by mouth daily as needed for moderate pain.      amLODipine (NORVASC) 10 MG tablet TAKE 1 TABLET BY MOUTH ONCE A DAY 90 tablet 3   atenolol (TENORMIN) 25 MG tablet Take 1 tablet (25 mg total) by mouth daily. 30 tablet 0   blood glucose meter kit and supplies One Touch Verio. Use up to four times daily as directed. (FOR ICD-10 E10.9, E11.9). 1 each 0   carbamide peroxide (DEBROX) 6.5 % OTIC solution PLACE 5 DROPS INTO THE LEFT EAR 2 (TWO) TIMES DAILY. 15 mL 0   cetirizine (ZYRTEC) 10 MG tablet Take 1 tablet (10 mg total) by  mouth daily. 30 tablet 0   ciprofloxacin-dexamethasone (CIPRODEX) OTIC suspension Place 4 drops into right ear twice a day for 7 days 7.5 mL 0   Fluocinolone Acetonide Scalp 0.01 % OIL APPLY 2 TIMES DAILY TO SCALP FOR 2 WEEKS ON AND 1 WEEK OFF REPEAT 118.28 mL 2   fluticasone (FLONASE) 50 MCG/ACT nasal spray Place 2 sprays into both nostrils daily. 18.2 mL 0   glimepiride (AMARYL) 4 MG tablet TAKE 2 TABLETS BY MOUTH ONCE A DAY BEFORE BREAKFAST 180 tablet 3   ibuprofen (ADVIL) 800 MG tablet Take 1 tablet once daily with evening meal. 14 tablet 0   JANUVIA 100 MG tablet TAKE 1 TABLET BY MOUTH ONCE A DAY 90 tablet 3   ketoconazole (NIZORAL) 2 % shampoo USE 1 TIME A WEEK LATHER ON SCALP, LEAVE ON 3-5 MINUTES, RINSE WELL 120 mL 5   Lancets (ONETOUCH ULTRASOFT) lancets Use as instructed 100 each 12   lidocaine (LIDODERM) 5 % Place 1 patch onto the skin daily. Remove & Discard patch within 12 hours or as directed by MD 30 patch 0   metFORMIN (GLUCOPHAGE) 500 MG tablet TAKE 2  TABLETS BY MOUTH 2 TIMES DAILY WITH A MEAL 360 tablet 3   methimazole (TAPAZOLE) 10 MG tablet Take 1 tablet (10 mg total) by mouth daily. 90 tablet 3   pantoprazole (PROTONIX) 40 MG tablet TAKE 1 TABLET BY MOUTH ONCE A DAY 90 tablet 3   pyrithione zinc (SM DANDRUFF SHAMPOO) 1 % shampoo Apply topically daily as needed for itching. 400 mL 12   simvastatin (ZOCOR) 20 MG tablet TAKE 1 TABLET BY MOUTH EVERY EVENING 90 tablet 3   Current Facility-Administered Medications  Medication Dose Route Frequency Provider Last Rate Last Admin   MEDERMA gel   Topical Once [name], MD       No results found.  Review of Systems:   A ROS was performed including pertinent positives and negatives as documented in the HPI.  Physical Exam :   Constitutional: NAD and appears stated age Neurological: Alert and oriented Psych: Appropriate affect and cooperative Last menstrual period 06/02/2013.   Comprehensive Musculoskeletal Exam:    Tenderness palpation about the lumbar spine.  Paraspinal musculature.  There is no radiation down the thigh or hip.  No pain with resisted abduction of the hips.  No pain with internal or external rotation of the hips.  She able to do a straight leg raise on the right.  Motor and sensation is intact bilateral lower extremities.  Imaging:   Xray (x-rays lumbar spine 3 views): Multilevel degenerative disc disease   I personally reviewed and interpreted the radiographs.   Assessment:   60 year old female with lower back pain in the setting of multilevel degenerative disc disease.  Given the fact that her pain is predominantly in her paraspinal musculature today I would like to prescribe her for a 2-week course of Robaxin.  She is also undergoing physical therapy which I agree with and recommend core strengthening program she will see back as needed  Plan :    -Follow-up as needed     I personally saw and evaluated the patient, and participated in the management and  treatment plan.  Vanetta Mulders, MD Attending Physician, Orthopedic Surgery  This document was dictated using Dragon voice recognition software. A reasonable attempt at proof reading has been made to minimize errors.

## 2021-09-11 ENCOUNTER — Telehealth: Payer: Self-pay

## 2021-09-11 ENCOUNTER — Ambulatory Visit: Payer: 59

## 2021-09-11 NOTE — Telephone Encounter (Signed)
Called due to missed appointment. Pt said she forgot about today. She confirmed next appointment.

## 2021-09-13 ENCOUNTER — Ambulatory Visit: Payer: 59

## 2021-09-13 ENCOUNTER — Other Ambulatory Visit: Payer: Self-pay

## 2021-09-13 DIAGNOSIS — G8929 Other chronic pain: Secondary | ICD-10-CM

## 2021-09-13 DIAGNOSIS — M6281 Muscle weakness (generalized): Secondary | ICD-10-CM

## 2021-09-13 DIAGNOSIS — M545 Low back pain, unspecified: Secondary | ICD-10-CM | POA: Diagnosis not present

## 2021-09-13 DIAGNOSIS — R293 Abnormal posture: Secondary | ICD-10-CM

## 2021-09-13 NOTE — Therapy (Addendum)
[place], [place], [address] Phone: [phone]   Fax:  [phone]  Physical Therapy Treatment/Discharge  Patient Details  Name: Crystal [name] [id number] MRN: [id number] Date of Birth: [birth year] Referring Provider (PT): Vevelyn Francois, NP   Encounter Date: 09/13/2021   PT End of Session - 09/13/21 1530     Visit Number 9    Number of Visits 13    Date for PT Re-Evaluation --   NA discharge   Authorization Type [employer]    PT Start Time 3734    PT Stop Time 2876    PT Time Calculation (min) 45 min    Activity Tolerance Patient tolerated treatment well    Behavior During Therapy [facility] for tasks assessed/performed             Past Medical History:  Diagnosis Date   Allergy    Diabetes mellitus    GERD (gastroesophageal reflux disease)    Hyperlipidemia    Hypertension    Hyperthyroidism     Past Surgical History:  Procedure Laterality Date   ANTERIOR CERVICAL DECOMP/DISCECTOMY FUSION N/A 10/11/2016   Procedure: ANTERIOR CERVICAL DISCECTOMY FUSION C6-7 WITH EXCISION OF OSTEOPHYTE;  Surgeon: [name], MD;  Location: [place];  Service: Orthopedics;  Laterality: N/A;   CESAREAN SECTION     TUBAL LIGATION      There were no vitals filed for this visit.   Subjective Assessment - 09/13/21 1532     Subjective Pt says she has pain in the Rt side of her back. Pt says she had a Dr appointment last week and they gave her medicine for her back and she has been taking it, but has not noticed any change in symtpoms. She says she has noticed a great improvement in her strength and back pain since when she first started physical therapy.    Limitations Lifting;Other (comment)    How long can you sit comfortably? sitting is ok    How long can you stand comfortably? 1 hour    How long can you walk comfortably? 45 minutes    Diagnostic tests Lumbar X-ray IMPRESSION:  No fracture or dislocation of the  lumbar spine. Moderate multilevel  disc space height loss and osteophytosis, as well as mild multilevel  facet degenerative change, worst at the lower lumbar levels. Lumbar  disc and neural foraminal pathology may be further evaluated by MRI  if indicated by neurologically localizing signs and symptoms.    Currently in Pain? Yes    Pain Score 6     Pain Location Back    Pain Orientation Right;Lower    Pain Descriptors / Indicators Tightness;Squeezing    Pain Type Chronic pain    Pain Onset More than a month ago    Pain Frequency Intermittent    Aggravating Factors  "none"    Pain Relieving Factors "rubbing it"    Effect of Pain on Daily Activities None            [facility] Adult PT Treatment/Exercise:  Therapeutic Exercise: - hip abduction and extension 2x15 yellow theraband  - attempted lunges, pt limited by clothing - quadruped donkey kicks 2x10 bilat  - piriformis stretch 3x30secs  - heel raises 2x12  - updated HEP Manual Therapy: - NA  Neuromuscular re-ed: - NA  Therapeutic Activity: - Discussed re-assessment findings and progress towards goals.   Self-care/Home Management: - Discussed self trigger point release using tennis ball and stretches for  pain management.      [facility] PT Assessment - 09/13/21 0001       AROM   Lumbar Flexion fingertips to floor    Lumbar Extension WFL, no pain    Lumbar - Right Side Bend fingertips to joint line    Lumbar - Left Side Bend fingertips to joint line    Lumbar - Right Rotation WFL    Lumbar - Left Rotation [facility]      Strength   Right Hip Flexion 4/5    Right Hip Extension 4+/5    Right Hip ABduction 4+/5    Left Hip Flexion 4/5    Left Hip Extension 4+/5    Left Hip ABduction 4+/5      Flexibility   Hamstrings WFL, no pain      Palpation   Spinal mobility hypomobile, no pain- moderate paraspinal guarding throughout L1-L4 CPAs    Palpation comment TTP and tightness in Rt lumbar paraspinals      Special Tests   Other  special tests (-) bilat, FABER (-) bilat                                      PT Short Term Goals - 09/13/21 1834       PT SHORT TERM GOAL #1   Title Patient will be independent with initial HEP.    Baseline issued at eval    Status Achieved    Target Date 08/14/21      PT SHORT TERM GOAL #2   Title Patient will demonstrate knowledge and application of appropriate sitting and standing posture to reduce stress on her low back.    Baseline posture cues continue to be required during session    Status Achieved    Target Date 08/21/21               PT Long Term Goals - 09/13/21 1834       PT LONG TERM GOAL #1   Title Patient will be able to complete sit to stand transfer after laying down for extended period of time without need for assistance.    Status Achieved      PT LONG TERM GOAL #2   Title Patient will demonstrate at least 4+/5 bilateral hip strength to improve stability about the chain with prolonged standing and walking.    Status Partially Met      PT LONG TERM GOAL #3   Title Patient will improve trunk flexion AROM by at least 20 cm when measured from fingertips to floor to improve ability to complete bending activity.    Status Achieved      PT LONG TERM GOAL #4   Title Patient will improve lumbar extension AROM by at least 25% to improve ability to reach overhead with proper mechanics.    Baseline 25%    Status Achieved                   Plan - 09/13/21 1837     Clinical Impression Statement Patient presents to O[facility] with Rt low back pain, but overall improved function. Pt has achieved all short term goals and demonstrates improved hip strength, lumbopelvic control, decreased aggrevating factors, and as well as decreased symtpoms. Pt reports no symtpom provocation from assessment, indicating significant improvement from initial eval.  Pt able to achieve 3/4 long term goals and no longer requires skilled physical therapy  to  address remaining hip strength deficits due to indep with pain management techniques and HEP. Therefore, patient is appropriate to be discharged at this time and encouraged to continue with HEP.    PT Treatment/Interventions ADLs/Self Care Home Management;Cryotherapy;Electrical Stimulation;Moist Heat;Traction;Functional mobility training;Therapeutic activities;Therapeutic exercise;Balance training;Neuromuscular re-education;Patient/family education;Manual techniques;Passive range of motion;Dry needling;Taping;Spinal Manipulations    PT Next Visit Plan NA discharge    PT Home Exercise Plan Access Code: [email or web address]    Consulted and Agree with Plan of Care Patient             Patient will benefit from skilled therapeutic intervention in order to improve the following deficits and impairments:  Decreased range of motion, Pain, Improper body mechanics, Hypomobility, Postural dysfunction, Decreased strength  Visit Diagnosis: No diagnosis found.     Problem List Patient Active Problem List   Diagnosis Date Noted   Ear itching 10/23/2020   Hyperthyroidism 12/17/2019   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   Type 2 diabetes mellitus with hyperglycemia, without long-term current use of insulin ([place]) 12/17/2019   Dyslipidemia 11/19/2019   Spinal stenosis of cervical region 10/11/2016    Class: Chronic   Cervical spinal stenosis 10/11/2016   Hypertension 09/09/2016   Diabetes mellitus ([place]) 09/09/2016   Multiple thyroid nodules 01/27/2013   Disorder of thyroid 07/07/2007   VARICOSE VEIN 07/07/2007   GERD 07/07/2007   PHYSICAL THERAPY DISCHARGE SUMMARY  Visits from Start of Care: 9  Current functional level related to goals / functional outcomes: See goals above    Remaining deficits: See impression   Education / Equipment: NA   Patient agrees to discharge. Patient goals were  mostly met . Patient is being discharged due to maximized rehab potential.   Glade Lloyd, [place] 09/13/21  6:58 PM   [place], [place], [address] Phone: [phone]   Fax:  [phone]  Name: Elasia Furnish MRN: [id number] Date of Birth: 1961/03/15

## 2021-09-25 NOTE — Progress Notes (Signed)
New Patient Note  RE: Crystal [name] MRN: [id number] DOB: 1961/09/21 Date of Office Visit: 09/26/2021  Consult requested by: Lynden Oxford Scales,* Primary care provider: Vevelyn Francois, NP  Chief Complaint: Allergic Rhinitis  (Sneezing, itching - ears, eyes, sides of nose, neck. at night has pain on the right side of her head down to her neck.  Tried nasal sprays and allergy medication no change )  History of Present Illness: I had the pleasure of seeing Crystal [name] for initial evaluation at the [facility] on 09/26/2021. She is a 60 y.o. female, who is referred here by Vevelyn Francois, NP for the evaluation of allergic rhinitis and rash. She is accompanied today by an Arabic interpreter in person.   Rhinitis: She reports symptoms of nasal congestion, ear pressure, sneezing, itching, right sided neck pain. Symptoms have been going on for 3 years. The symptoms are present all year around. Other triggers include exposure to unknown. Anosmia: no. Headache: sometimes. She has used zyrtec, Flonase with no improvement in symptoms. Sinus infections: no. Previous work up includes: none. Previous ENT evaluation: yes for the ears and was told she might have TMJ. She is not sure what TMJ is. No rhinoscopy.  Previous sinus imaging: no. History of nasal polyps: no. History of reflux: yes and takes medications only as needed.  Itching:  Itching started about 3 years ago. Mainly occurs on her face.  Frequency of episodes: daily. Suspected triggers are unknown. Denies any fevers, chills, changes in medications, foods, personal care products or recent infections. She has tried the following therapies: none.   Patient wears perfume.  She uses Vaseline prn.   Assessment and Plan: Crystal [name] is a 60 y.o. female with: Other allergic rhinitis Perennial rhinitis symptoms with ear pressure and right-sided neck pain for the last 3 years.  Unknown triggers.  Tried Zyrtec and Flonase  with minimal benefit.  No prior allergy evaluation.  Saw ENT and was told she may have TMJ.  No prior rhinoscopy. Today's skin testing showed: Positive to cockroach and borderline to mold mix #4. Start environmental control measures as below. Patient denies having cockroach/mold issues. I doubt these allergens are contributing to her symptoms. And environmental  allergies typically do not cause unilateral symptoms. Use Flonase (fluticasone) nasal spray 1 spray per nostril twice a day as needed for nasal congestion.  Nasal saline spray (i.e., Simply Saline) or nasal saline lavage (i.e., NeilMed) is recommended as needed and prior to medicated nasal sprays. Refer to ENT - for chronic nasal congestion and ear pain.  Pruritus Pruritus for the last 3 years mainly on her face.  Has not tried any antihistamines.  Uses Vaseline and perfume. Has uncontrolled diabetes. Today's skin testing showed: Positive to cockroach and borderline to mold mix #4. Negative to common foods. See below for proper skin care. Do not use anything with fragrances/scents.  No perfume, no dryer sheets, no fabric softener.  Moisturize daily. Take zyrtec $RemoveBef'10mg'[id number]$  daily to help with the itching.  Get bloodwork to rule out other etiologies.  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 Patient was not sure what TMJ was. Handout given in Arabic.  Return in about 6 months (around 03/27/2022).  Meds ordered this encounter  Medications   cetirizine (ZYRTEC ALLERGY) 10 MG tablet    Sig: Take 1 tablet (10 mg total) by mouth at bedtime.    Dispense:  30  tablet    Refill:  3   fluticasone (FLONASE) 50 MCG/ACT nasal spray    Sig: Place 1 spray into both nostrils 2 (two) times daily as needed (nasal congestion).    Dispense:  16 g    Refill:  5   Lab Orders         ANA w/Reflex         Alpha-Gal Panel         CBC with Differential/Platelet         Comprehensive  metabolic panel         C-reactive protein         Sedimentation rate         Tryptase         Thyroid Cascade Profile      Other allergy screening: Asthma: no Food allergy: no Medication allergy: no Hymenoptera allergy: no Urticaria: no Eczema:no History of recurrent infections suggestive of immunodeficency: no  Diagnostics: Skin Testing: Environmental allergy panel and select foods. Positive to cockroach and borderline to mold mix #4. Negative to common foods Results discussed with patient/family.  Airborne Adult Perc - 09/26/21 1427     Time Antigen Placed 1427    Allergen Manufacturer Lavella Hammock    Location Back    Number of Test 59    1. Control-Buffer 50% Glycerol Negative    2. Control-Histamine 1 mg/ml 2+    3. Albumin saline Negative    4. Hoskins Negative    5. [place] Negative    6. Johnson Negative    7. Crittenden Blue Negative    8. Meadow Fescue Negative    9. Perennial Rye Negative    10. Sweet Vernal Negative    11. Timothy Negative    12. Cocklebur Negative    13. Burweed Marshelder Negative    14. Ragweed, short Negative    15. Ragweed, Giant Negative    16. Plantain,  English Negative    17. Lamb's Quarters Negative    18. Sheep Sorrell Negative    19. Rough Pigweed Negative    20. Marsh Elder, Rough Negative    21. Mugwort, Common Negative    22. Ash mix Negative    23. Birch mix Negative    24. Beech American Negative    25. Box, Elder Negative    26. Cedar, red Negative    27. Cottonwood, [place] Negative    28. Elm mix Negative    29. Hickory Negative    30. Maple mix Negative    31. Oak, [place] mix Negative    32. Pecan Pollen Negative    33. Pine mix Negative    34. Sycamore Eastern Negative    35. Pine Hills, Black Pollen Negative    36. Alternaria alternata Negative    37. Cladosporium Herbarum Negative    38. Aspergillus mix Negative    39. Penicillium mix Negative    40. Bipolaris sorokiniana (Helminthosporium) Negative    41.  Drechslera spicifera (Curvularia) Negative    42. Mucor plumbeus Negative    43. Fusarium moniliforme Negative    44. Aureobasidium pullulans (pullulara) Negative    45. Rhizopus oryzae Negative    46. Botrytis cinera Negative    47. Epicoccum nigrum Negative    48. Phoma betae Negative    49. Candida Albicans Negative    50. Trichophyton mentagrophytes Negative    51. Mite, D Farinae  5,000 AU/ml Negative    52. Mite, D Pteronyssinus  5,000 AU/ml Negative  53. Cat Hair 10,000 BAU/ml Negative    54.  Dog Epithelia Negative    55. Mixed Feathers Negative    56. Horse Epithelia Negative    57. Cockroach, German Negative    58. Mouse Negative    59. Tobacco Leaf Negative             Food Perc - 09/26/21 1427       Test Information   Time Antigen Placed 1427    Allergen Manufacturer Lavella Hammock    Location Back    Number of allergen test 10      Food   1. Peanut Negative    2. Soybean food Negative    3. Wheat, whole Negative    4. Sesame Negative    5. Milk, cow Negative    6. Egg White, chicken Negative    7. Casein Negative    8. Shellfish mix Negative    9. Fish mix Negative    10. 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) 09/26/2021   Ear itching 10/23/2020   Hyperthyroidism 12/17/2019   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   Type 2 diabetes  mellitus with hyperglycemia, without long-term current use of insulin ([place]) 12/17/2019   Dyslipidemia 11/19/2019   Spinal stenosis of cervical region 10/11/2016    Class: Chronic   Cervical spinal stenosis 10/11/2016   Hypertension 09/09/2016   Diabetes mellitus ([place]) 09/09/2016   Multiple thyroid nodules 01/27/2013   Disorder of thyroid 07/07/2007   VARICOSE VEIN 07/07/2007   GERD 07/07/2007   Past Medical History:  Diagnosis Date   Allergy    Diabetes mellitus    GERD (gastroesophageal reflux disease)    Hyperlipidemia    Hypertension    Hyperthyroidism    Recurrent upper respiratory infection (URI)    Past Surgical History: Past Surgical History:  Procedure Laterality Date   ANTERIOR CERVICAL DECOMP/DISCECTOMY FUSION N/A 10/11/2016   Procedure: ANTERIOR CERVICAL DISCECTOMY FUSION C6-7 WITH EXCISION OF OSTEOPHYTE;  Surgeon: [name], MD;  Location: [facility];  Service: Orthopedics;  Laterality: N/A;   CESAREAN SECTION     TUBAL LIGATION     Medication List:  Current Outpatient Medications  Medication Sig Dispense Refill   acetaminophen (TYLENOL) 500 MG tablet Take 1,000 mg by mouth daily as needed for moderate pain.      amLODipine (NORVASC) 10 MG tablet TAKE 1 TABLET BY MOUTH ONCE A DAY 90 tablet 3   atenolol (TENORMIN) 25 MG tablet Take 1 tablet (25 mg total) by mouth daily. 30 tablet  0   blood glucose meter kit and supplies One Touch Verio. Use up to four times daily as directed. (FOR ICD-10 E10.9, E11.9). 1 each 0   carbamide peroxide (DEBROX) 6.5 % OTIC solution PLACE 5 DROPS INTO THE LEFT EAR 2 (TWO) TIMES DAILY. 15 mL 0   cetirizine (ZYRTEC ALLERGY) 10 MG tablet Take 1 tablet (10 mg total) by mouth at bedtime. 30 tablet 3   ciprofloxacin-dexamethasone (CIPRODEX) OTIC suspension Place 4 drops into right ear twice a day for 7 days 7.5 mL 0   Fluocinolone Acetonide Scalp 0.01 % OIL APPLY 2 TIMES DAILY TO SCALP FOR 2 WEEKS ON AND 1 WEEK OFF REPEAT 118.28 mL 2    fluticasone (FLONASE) 50 MCG/ACT nasal spray Place 1 spray into both nostrils 2 (two) times daily as needed (nasal congestion). 16 g 5   glimepiride (AMARYL) 4 MG tablet TAKE 2 TABLETS BY MOUTH ONCE A DAY BEFORE BREAKFAST 180 tablet 3   JANUVIA 100 MG tablet TAKE 1 TABLET BY MOUTH ONCE A DAY 90 tablet 3   Lancets (ONETOUCH ULTRASOFT) lancets Use as instructed 100 each 12   metFORMIN (GLUCOPHAGE) 500 MG tablet TAKE 2 TABLETS BY MOUTH 2 TIMES DAILY WITH A MEAL 360 tablet 3   methimazole (TAPAZOLE) 10 MG tablet Take 1 tablet (10 mg total) by mouth daily. 90 tablet 3   pantoprazole (PROTONIX) 40 MG tablet TAKE 1 TABLET BY MOUTH ONCE A DAY 90 tablet 3   simvastatin (ZOCOR) 20 MG tablet TAKE 1 TABLET BY MOUTH EVERY EVENING 90 tablet 3   Current Facility-Administered Medications  Medication Dose Route Frequency Provider Last Rate Last Admin   MEDERMA gel   Topical Once [name], MD       Allergies: Allergies  Allergen Reactions   Aspirin Nausea And Vomiting   Tramadol     Upset stomach    Social History: Social History   Socioeconomic History   Marital status: Married    Spouse name: Not on file   Number of children: Not on file   Years of education: Not on file   Highest education level: Not on file  Occupational History   Not on file  Tobacco Use   Smoking status: Never   Smokeless tobacco: Never  Substance and Sexual Activity   Alcohol use: No   Drug use: No   Sexual activity: Not on file  Other Topics Concern   Not on file  Social History Narrative   Not on file   Social Determinants of Health   Financial Resource Strain: Not on file  Food Insecurity: Not on file  Transportation Needs: Not on file  Physical Activity: Not on file  Stress: Not on file  Social Connections: Not on file   Lives in a house which is 59 years old. Smoking: denies Occupation: not employed  Environmental HistoryFreight forwarder in the house: no Charity fundraiser in the family room:  no Carpet in the bedroom: no Heating: gas Cooling: central Pet: no  Family History: Family History  Problem Relation Age of Onset   Colon cancer Neg Hx    Colon polyps Neg Hx    Esophageal cancer Neg Hx    Rectal cancer Neg Hx    Stomach cancer Neg Hx    Problem                               Relation Asthma  no Eczema                                no Food allergy                          no Allergic rhino conjunctivitis     no  Review of Systems  Constitutional:  Negative for appetite change, chills, fever and unexpected weight change.  HENT:  Positive for congestion and sinus pressure. Negative for rhinorrhea.   Eyes:  Negative for itching.  Respiratory:  Negative for cough, chest tightness, shortness of breath and wheezing.   Cardiovascular:  Negative for chest pain.  Gastrointestinal:  Negative for abdominal pain.  Genitourinary:  Negative for difficulty urinating.  Musculoskeletal:  Positive for neck pain.  Skin:  Negative for rash.       itching  Allergic/Immunologic: Positive for environmental allergies. Negative for food allergies.  Neurological:  Positive for headaches.   Objective: BP 136/82    Pulse (!) 113    Temp (!) 97.2 F (36.2 C)    Resp 16    Ht $R'5\' 6"'Rw$  (1.676 m)    Wt 165 lb (74.8 kg)    LMP 06/02/2013    SpO2 100%    BMI 26.63 kg/m  Body mass index is 26.63 kg/m. Physical Exam Vitals and nursing note reviewed.  Constitutional:      Appearance: Normal appearance. She is well-developed.  HENT:     Head: Normocephalic and atraumatic.     Right Ear: Tympanic membrane and external ear normal.     Left Ear: Tympanic membrane and external ear normal.     Nose: Nose normal.     Mouth/Throat:     Mouth: Mucous membranes are moist.     Pharynx: Oropharynx is clear.  Eyes:     Conjunctiva/sclera: Conjunctivae normal.  Cardiovascular:     Rate and Rhythm: Normal rate and regular rhythm.     Heart sounds: Normal heart  sounds. No murmur heard.   No friction rub. No gallop.  Pulmonary:     Effort: Pulmonary effort is normal.     Breath sounds: Normal breath sounds. No wheezing, rhonchi or rales.  Musculoskeletal:     Cervical back: Neck supple.  Skin:    General: Skin is warm.     Findings: No rash.     Comments: Hyperpigmentation noted on the face.  Neurological:     Mental Status: She is alert and oriented to person, place, and time.  Psychiatric:        Behavior: Behavior normal.  The plan was reviewed with the patient/family, and all questions/concerned were addressed.  It was my pleasure to see Crystal [name] today and participate in her care. Please feel free to contact me with any questions or concerns.  Sincerely,  Rexene Alberts, DO Allergy & Immunology  [facility] office: [place] office: [phone]

## 2021-09-26 ENCOUNTER — Telehealth: Payer: Self-pay

## 2021-09-26 ENCOUNTER — Other Ambulatory Visit (HOSPITAL_COMMUNITY): Payer: Self-pay

## 2021-09-26 ENCOUNTER — Encounter: Payer: Self-pay | Admitting: Allergy

## 2021-09-26 ENCOUNTER — Other Ambulatory Visit: Payer: Self-pay

## 2021-09-26 ENCOUNTER — Ambulatory Visit (INDEPENDENT_AMBULATORY_CARE_PROVIDER_SITE_OTHER): Payer: 59 | Admitting: Allergy

## 2021-09-26 VITALS — BP 136/82 | HR 113 | Temp 97.2°F | Resp 16 | Ht 66.0 in | Wt 165.0 lb

## 2021-09-26 DIAGNOSIS — M26609 Unspecified temporomandibular joint disorder, unspecified side: Secondary | ICD-10-CM | POA: Diagnosis not present

## 2021-09-26 DIAGNOSIS — J3089 Other allergic rhinitis: Secondary | ICD-10-CM | POA: Insufficient documentation

## 2021-09-26 DIAGNOSIS — L299 Pruritus, unspecified: Secondary | ICD-10-CM | POA: Insufficient documentation

## 2021-09-26 MED ORDER — FLUTICASONE PROPIONATE 50 MCG/ACT NA SUSP
1.0000 | Freq: Two times a day (BID) | NASAL | 5 refills | Status: DC | PRN
  Filled 2021-09-26: qty 16, 30d supply, fill #0

## 2021-09-26 MED ORDER — CETIRIZINE HCL 10 MG PO TABS
10.0000 mg | ORAL_TABLET | Freq: Every day | ORAL | 3 refills | Status: DC
  Filled 2021-09-26: qty 30, 30d supply, fill #0

## 2021-09-26 NOTE — Assessment & Plan Note (Addendum)
Perennial rhinitis symptoms with ear pressure and right-sided neck pain for the last 3 years.  Unknown triggers.  Tried Zyrtec and Flonase with minimal benefit.  No prior allergy evaluation.  Saw ENT and was told she may have TMJ.  No prior rhinoscopy.  Today's skin testing showed: Positive to cockroach and borderline to mold mix #4.  Start environmental control measures as below.  Patient denies having cockroach/mold issues.  I doubt these allergens are contributing to her symptoms. And environmental  allergies typically do not cause unilateral symptoms.  Use Flonase (fluticasone) nasal spray 1 spray per nostril twice a day as needed for nasal congestion.   Nasal saline spray (i.e., Simply Saline) or nasal saline lavage (i.e., NeilMed) is recommended as needed and prior to medicated nasal sprays.  Refer to ENT - for chronic nasal congestion and ear pain.

## 2021-09-26 NOTE — Telephone Encounter (Signed)
Please refer the patient to ENT for chronic nasal congestion and ear pain.

## 2021-09-26 NOTE — Assessment & Plan Note (Signed)
Per ENT she may have TMJ causing her ear and neck pain issues. Patient was not sure what TMJ was.  Handout given in Arabic.

## 2021-09-26 NOTE — Patient Instructions (Addendum)
Today's skin testing showed: Positive to cockroach and borderline to mold mix #4. Negative to common foods Results given.  Rhinitis: Start environmental control measures as below. Use Flonase (fluticasone) nasal spray 1 spray per nostril twice a day as needed for nasal congestion.  Nasal saline spray (i.e., Simply Saline) or nasal saline lavage (i.e., NeilMed) is recommended as needed and prior to medicated nasal sprays. Refer to ENT - for chronic nasal congestion and ear pain.  Itching See below for proper skin care. Do not use anything with fragrances/scents.  No perfume, no dryer sheets, no fabric softener.  Moisturize daily. Take zyrtec 10mg  daily to help with the itching.  Get bloodwork We are ordering labs, so please allow 1-2 weeks for the results to come back. With the newly implemented Cures Act, the labs might be visible to you at the same time that they become visible to me. However, I will not address the results until all of the results are back, so please be patient.  In the meantime, continue recommendations in your patient instructions, including avoidance measures (if applicable), until you hear from me.  TMJ Handout given in Arabic.  Follow up in 6 months or sooner if needed.    Skin care recommendations  Bath time: Always use lukewarm water. AVOID very hot or cold water. Keep bathing time to 5-10 minutes. Do NOT use bubble bath. Use a mild soap and use just enough to wash the dirty areas. Do NOT scrub skin vigorously.  After bathing, pat dry your skin with a towel. Do NOT rub or scrub the skin.  Moisturizers and prescriptions:  ALWAYS apply moisturizers immediately after bathing (within 3 minutes). This helps to lock-in moisture. Use the moisturizer several times a day over the whole body. Good summer moisturizers include: Aveeno, CeraVe, Cetaphil. Good winter moisturizers include: Aquaphor, Vaseline, Cerave, Cetaphil, Eucerin, Vanicream. When using  moisturizers along with medications, the moisturizer should be applied about one hour after applying the medication to prevent diluting effect of the medication or moisturize around where you applied the medications. When not using medications, the moisturizer can be continued twice daily as maintenance.  Laundry and clothing: Avoid laundry products with added color or perfumes. Use unscented hypo-allergenic laundry products such as Tide free, Cheer free & gentle, and All free and clear.  If the skin still seems dry or sensitive, you can try double-rinsing the clothes. Avoid tight or scratchy clothing such as wool. Do not use fabric softeners or dyer sheets.   Cockroach Allergen Avoidance Cockroaches are often found in the homes of densely populated urban areas, schools or commercial buildings, but these creatures can lurk almost anywhere. This does not mean that you have a dirty house or living area. Block all areas where roaches can enter the home. This includes crevices, wall cracks and windows.  Cockroaches need water to survive, so fix and seal all leaky faucets and pipes. Have an exterminator go through the house when your family and pets are gone to eliminate any remaining roaches. Keep food in lidded containers and put pet food dishes away after your pets are done eating. Vacuum and sweep the floor after meals, and take out garbage and recyclables. Use lidded garbage containers in the kitchen. Wash dishes immediately after use and clean under stoves, refrigerators or toasters where crumbs can accumulate. Wipe off the stove and other kitchen surfaces and cupboards regularly. Mold Control Mold and fungi can grow on a variety of surfaces provided certain temperature and moisture  conditions exist.  Outdoor molds grow on plants, decaying vegetation and soil. The major outdoor mold, Alternaria and Cladosporium, are found in very high numbers during hot and dry conditions. Generally, a late summer -  fall peak is seen for common outdoor fungal spores. Rain will temporarily lower outdoor mold spore count, but counts rise rapidly when the rainy period ends. The most important indoor molds are Aspergillus and Penicillium. Dark, humid and poorly ventilated basements are ideal sites for mold growth. The next most common sites of mold growth are the bathroom and the kitchen. Outdoor (Seasonal) Mold Control Use air conditioning and keep windows closed. Avoid exposure to decaying vegetation. Avoid leaf raking. Avoid grain handling. Consider wearing a face mask if working in moldy areas.  Indoor (Perennial) Mold Control  Maintain humidity below 50%. Get rid of mold growth on hard surfaces with water, detergent and, if necessary, 5% bleach (do not mix with other cleaners). Then dry the area completely. If mold covers an area more than 10 square feet, consider hiring an indoor environmental professional. For clothing, washing with soap and water is best. If moldy items cannot be cleaned and dried, throw them away. Remove sources e.g. contaminated carpets. Repair and seal leaking roofs or pipes. Using dehumidifiers in damp basements may be helpful, but empty the water and clean units regularly to prevent mildew from forming. All rooms, especially basements, bathrooms and kitchens, require ventilation and cleaning to deter mold and mildew growth. Avoid carpeting on concrete or damp floors, and storing items in damp areas.

## 2021-09-26 NOTE — Assessment & Plan Note (Signed)
Pruritus for the last 3 years mainly on her face.  Has not tried any antihistamines.  Uses Vaseline and perfume. Has uncontrolled diabetes.  Today's skin testing showed: Positive to cockroach and borderline to mold mix #4. Negative to common foods.  See below for proper skin care.  Do not use anything with fragrances/scents.  o No perfume, no dryer sheets, no fabric softener.   Moisturize daily.  Take zyrtec 10mg  daily to help with the itching.   Get bloodwork to rule out other etiologies.

## 2021-09-28 ENCOUNTER — Ambulatory Visit: Payer: 59 | Admitting: Nurse Practitioner

## 2021-09-29 LAB — COMPREHENSIVE METABOLIC PANEL
ALT: 37 IU/L — ABNORMAL HIGH (ref 0–32)
AST: 19 IU/L (ref 0–40)
Albumin/Globulin Ratio: 1.5 (ref 1.2–2.2)
Albumin: 4.3 g/dL (ref 3.8–4.9)
Alkaline Phosphatase: 142 IU/L — ABNORMAL HIGH (ref 44–121)
BUN/Creatinine Ratio: 27 — ABNORMAL HIGH (ref 9–23)
BUN: 10 mg/dL (ref 6–24)
Bilirubin Total: 0.3 mg/dL (ref 0.0–1.2)
CO2: 23 mmol/L (ref 20–29)
Calcium: 10.2 mg/dL (ref 8.7–10.2)
Chloride: 102 mmol/L (ref 96–106)
Creatinine, Ser: 0.37 mg/dL — ABNORMAL LOW (ref 0.57–1.00)
Globulin, Total: 2.9 g/dL (ref 1.5–4.5)
Glucose: 144 mg/dL — ABNORMAL HIGH (ref 70–99)
Potassium: 5.1 mmol/L (ref 3.5–5.2)
Sodium: 143 mmol/L (ref 134–144)
Total Protein: 7.2 g/dL (ref 6.0–8.5)
eGFR: 116 mL/min/{1.73_m2} (ref >59)

## 2021-09-29 LAB — CBC WITH DIFFERENTIAL/PLATELET
Basophils Absolute: 0 10*3/uL (ref 0.0–0.2)
Basos: 0 %
EOS (ABSOLUTE): 0.1 10*3/uL (ref 0.0–0.4)
Eos: 2 %
Hematocrit: 41.9 % (ref 34.0–46.6)
Hemoglobin: 12.9 g/dL (ref 11.1–15.9)
Immature Grans (Abs): 0 10*3/uL (ref 0.0–0.1)
Immature Granulocytes: 0 %
Lymphocytes Absolute: 2.7 10*3/uL (ref 0.7–3.1)
Lymphs: 37 %
MCH: 22.4 pg — ABNORMAL LOW (ref 26.6–33.0)
MCHC: 30.8 g/dL — ABNORMAL LOW (ref 31.5–35.7)
MCV: 73 fL — ABNORMAL LOW (ref 79–97)
Monocytes Absolute: 0.5 10*3/uL (ref 0.1–0.9)
Monocytes: 7 %
Neutrophils Absolute: 4 10*3/uL (ref 1.4–7.0)
Neutrophils: 54 %
Platelets: 412 10*3/uL (ref 150–450)
RBC: 5.75 x10E6/uL — ABNORMAL HIGH (ref 3.77–5.28)
RDW: 13.9 % (ref 11.7–15.4)
WBC: 7.4 10*3/uL (ref 3.4–10.8)

## 2021-09-29 LAB — ALPHA-GAL PANEL
Allergen Lamb IgE: <0.1 kU/L
Beef IgE: 0.11 kU/L — AB
IgE (Immunoglobulin E), Serum: 121 IU/mL (ref 6–495)
O215-IgE Alpha-Gal: <0.1 kU/L
Pork IgE: <0.1 kU/L

## 2021-09-29 LAB — C-REACTIVE PROTEIN: CRP: 11 mg/L — ABNORMAL HIGH (ref 0–10)

## 2021-09-29 LAB — THYROXINE (T4) FREE, DIRECT: T4,Free (Direct): 1.93 ng/dL — ABNORMAL HIGH (ref 0.82–1.77)

## 2021-09-29 LAB — ANA W/REFLEX: Anti Nuclear Antibody (ANA): NEGATIVE

## 2021-09-29 LAB — TRYPTASE: Tryptase: 6.5 ug/L (ref 2.2–13.2)

## 2021-09-29 LAB — SEDIMENTATION RATE: Sed Rate: 41 mm/hr — ABNORMAL HIGH (ref 0–40)

## 2021-09-29 LAB — THYROID CASCADE PROFILE: TSH: <0.005 u[IU]/mL — ABNORMAL LOW (ref 0.450–4.500)

## 2021-10-02 NOTE — Telephone Encounter (Signed)
Left message with interpreter services (ID #[id number] name:Shams) for patient to call back. Called patient to verify what insurance she will have in the new year - pt currently has [employer] which will not be renewing their services for the 2023 exchange - before we are able to send referral out to another office.

## 2021-10-02 NOTE — Telephone Encounter (Signed)
Angela Nevin could you give me a helping hand with this one?  Thanks

## 2021-10-02 NOTE — Telephone Encounter (Signed)
Noted  

## 2021-10-04 ENCOUNTER — Other Ambulatory Visit (HOSPITAL_COMMUNITY): Payer: Self-pay

## 2021-10-22 ENCOUNTER — Other Ambulatory Visit (HOSPITAL_COMMUNITY): Payer: Self-pay

## 2021-11-01 NOTE — Telephone Encounter (Signed)
Patient called back and stated she now has [employer]

## 2021-11-05 NOTE — Telephone Encounter (Signed)
Referral has been placed to [facility].   Left detailed message with interpreter services [phone]) advising patient of referral placement. Provided patient with address and phone number to [facility]. Advised patient in message to reach out to their office if they have not heard from them.  [address], [place] [address]  [phone]  / Fax: [phone]

## 2021-12-07 ENCOUNTER — Other Ambulatory Visit: Payer: Self-pay

## 2021-12-07 ENCOUNTER — Encounter: Payer: Self-pay | Admitting: Nurse Practitioner

## 2021-12-07 ENCOUNTER — Other Ambulatory Visit (HOSPITAL_COMMUNITY): Payer: Self-pay

## 2021-12-07 ENCOUNTER — Ambulatory Visit (INDEPENDENT_AMBULATORY_CARE_PROVIDER_SITE_OTHER): Payer: Self-pay | Admitting: Nurse Practitioner

## 2021-12-07 VITALS — BP 127/52 | HR 88 | Temp 97.3°F | Ht 65.0 in | Wt 164.0 lb

## 2021-12-07 DIAGNOSIS — E785 Hyperlipidemia, unspecified: Secondary | ICD-10-CM

## 2021-12-07 DIAGNOSIS — H9311 Tinnitus, right ear: Secondary | ICD-10-CM

## 2021-12-07 DIAGNOSIS — E1165 Type 2 diabetes mellitus with hyperglycemia: Secondary | ICD-10-CM

## 2021-12-07 DIAGNOSIS — G4700 Insomnia, unspecified: Secondary | ICD-10-CM

## 2021-12-07 DIAGNOSIS — E0590 Thyrotoxicosis, unspecified without thyrotoxic crisis or storm: Secondary | ICD-10-CM

## 2021-12-07 DIAGNOSIS — I10 Essential (primary) hypertension: Secondary | ICD-10-CM

## 2021-12-07 MED ORDER — MELATONIN 10 MG PO CAPS
20.0000 mg | ORAL_CAPSULE | Freq: Every evening | ORAL | 2 refills | Status: DC | PRN
  Filled 2021-12-07: qty 60, fill #0

## 2021-12-07 MED ORDER — SIMVASTATIN 20 MG PO TABS
ORAL_TABLET | Freq: Every evening | ORAL | 3 refills | Status: DC
  Filled 2021-12-07: qty 90, 90d supply, fill #0

## 2021-12-07 MED ORDER — AMLODIPINE BESYLATE 10 MG PO TABS
10.0000 mg | ORAL_TABLET | Freq: Every day | ORAL | 3 refills | Status: DC
  Filled 2021-12-07 – 2022-02-01 (×3): qty 90, 90d supply, fill #0

## 2021-12-07 MED ORDER — PANTOPRAZOLE SODIUM 40 MG PO TBEC
40.0000 mg | DELAYED_RELEASE_TABLET | Freq: Every day | ORAL | 3 refills | Status: DC
  Filled 2021-12-07: qty 90, 90d supply, fill #0

## 2021-12-07 MED ORDER — JANUVIA 100 MG PO TABS
100.0000 mg | ORAL_TABLET | Freq: Every day | ORAL | 3 refills | Status: DC
  Filled 2021-12-07 (×2): qty 90, 90d supply, fill #0

## 2021-12-07 MED ORDER — METFORMIN HCL 500 MG PO TABS
ORAL_TABLET | ORAL | 3 refills | Status: DC
  Filled 2021-12-07: qty 360, 90d supply, fill #0

## 2021-12-07 MED FILL — Glimepiride Tab 4 MG: ORAL | 90 days supply | Qty: 180 | Fill #1 | Status: AC

## 2021-12-07 NOTE — Patient Instructions (Signed)

## 2021-12-07 NOTE — Progress Notes (Signed)
[facility] Patient [facility] [address] [place], [place]  [address] Phone:  [phone]   Fax:  [phone]   Established Patient Office Visit  Subjective:  Patient ID: Crystal [name], female    DOB: 1960-11-21  Age: 61 y.o. MRN: [id number]  CC:  Chief Complaint  Patient presents with   Follow-up    Pt is here today for her follow up visit. Pt states she is still having ear pain in her right ear and has not seen the ENT yet. Pt has also been having tingling sensations in her left arm x 2 months.    HPI Crystal [name] presents for follow up. She  has a past medical history of Allergy, Diabetes mellitus, GERD (gastroesophageal reflux disease), Hyperlipidemia, Hypertension, Hyperthyroidism, and Recurrent upper respiratory infection (URI).   Crystal [name] is in today for diabetes follow up. The prescribed treatment is Glimepiride 4 mg daily, januvia 100 mg daily, metformin 500 mg BID along with statin therapy simvastatin. The reported use of treatment is  consistent with prescribed. There are no reported side effects from the treatment. Home glucose monitoring indicates a CBG range consistent with past. She reports left arm tingling that comes an goes. She denies any chest pain, chest tightness, shortness of breath, neck pain, nausea or vomiting.  Denies fever, chills, headache, dizziness, visual changes, polydipsia,  polyphagia , abdominal pain, polyuria, constipation, diarrhea, edema, numbness, burning of hand or feet.      She continues to have roaring in her right ear; which has been on going and it is affecting her sleep. This is annoying and persistent. She denies any pain. She is wanting some treatment for her ear roaring. She denies any injury.  She is waiting to be evaluated by ENT.   She continues on Methimazole 10 mg . She has not followed up with endocrinology in greater than one year. Past Medical History:  Diagnosis Date   Allergy    Diabetes mellitus    GERD  (gastroesophageal reflux disease)    Hyperlipidemia    Hypertension    Hyperthyroidism    Recurrent upper respiratory infection (URI)     Past Surgical History:  Procedure Laterality Date   ANTERIOR CERVICAL DECOMP/DISCECTOMY FUSION N/A 10/11/2016   Procedure: ANTERIOR CERVICAL DISCECTOMY FUSION C6-7 WITH EXCISION OF OSTEOPHYTE;  Surgeon: Kerrin Champagne, MD;  Location: [facility] OR;  Service: Orthopedics;  Laterality: N/A;   CESAREAN SECTION     TUBAL LIGATION      Family History  Problem Relation Age of Onset   Colon cancer Neg Hx    Colon polyps Neg Hx    Esophageal cancer Neg Hx    Rectal cancer Neg Hx    Stomach cancer Neg Hx     Social History   Socioeconomic History   Marital status: Married    Spouse name: Not on file   Number of children: Not on file   Years of education: Not on file   Highest education level: Not on file  Occupational History   Not on file  Tobacco Use   Smoking status: Never   Smokeless tobacco: Never  Substance and Sexual Activity   Alcohol use: No   Drug use: No   Sexual activity: Yes  Other Topics Concern   Not on file  Social History Narrative   Not on file   Social Determinants of Health   Financial Resource Strain: Not on file  Food Insecurity: Not on file  Transportation Needs: Not on file  Physical Activity: Not on file  Stress: Not on file  Social Connections: Not on file  Intimate Partner Violence: Not on file    Outpatient Medications Prior to Visit  Medication Sig Dispense Refill   acetaminophen (TYLENOL) 500 MG tablet Take 1,000 mg by mouth daily as needed for moderate pain.      atenolol (TENORMIN) 25 MG tablet Take 1 tablet (25 mg total) by mouth daily. 30 tablet 0   blood glucose meter kit and supplies One Touch Verio. Use up to four times daily as directed. (FOR ICD-10 E10.9, E11.9). 1 each 0   carbamide peroxide (DEBROX) 6.5 % OTIC solution PLACE 5 DROPS INTO THE LEFT EAR 2 (TWO) TIMES DAILY. 15 mL 0   cetirizine  (ZYRTEC ALLERGY) 10 MG tablet Take 1 tablet (10 mg total) by mouth at bedtime. 30 tablet 3   ciprofloxacin-dexamethasone (CIPRODEX) OTIC suspension Place 4 drops into right ear twice a day for 7 days 7.5 mL 0   fluticasone (FLONASE) 50 MCG/ACT nasal spray Place 1 spray into both nostrils 2 (two) times daily as needed (nasal congestion). 16 g 5   glimepiride (AMARYL) 4 MG tablet TAKE 2 TABLETS BY MOUTH ONCE A DAY BEFORE BREAKFAST 180 tablet 3   Lancets (ONETOUCH ULTRASOFT) lancets Use as instructed 100 each 12   methimazole (TAPAZOLE) 10 MG tablet Take 1 tablet (10 mg total) by mouth daily. 90 tablet 3   amLODipine (NORVASC) 10 MG tablet TAKE 1 TABLET BY MOUTH ONCE A DAY 90 tablet 3   JANUVIA 100 MG tablet TAKE 1 TABLET BY MOUTH ONCE A DAY 90 tablet 3   metFORMIN (GLUCOPHAGE) 500 MG tablet TAKE 2 TABLETS BY MOUTH 2 TIMES DAILY WITH A MEAL 360 tablet 3   pantoprazole (PROTONIX) 40 MG tablet TAKE 1 TABLET BY MOUTH ONCE A DAY 90 tablet 3   simvastatin (ZOCOR) 20 MG tablet TAKE 1 TABLET BY MOUTH EVERY EVENING 90 tablet 3   Facility-Administered Medications Prior to Visit  Medication Dose Route Frequency Provider Last Rate Last Admin   MEDERMA gel   Topical Once [name], MD        Allergies  Allergen Reactions   Aspirin Nausea And Vomiting   Tramadol     Upset stomach     ROS Review of Systems    Objective:    Physical Exam HENT:     Head: Normocephalic.     Right Ear: Tympanic membrane normal. There is no impacted cerumen.     Left Ear: Tympanic membrane normal. There is no impacted cerumen.     Nose: Nose normal.     Mouth/Throat:     Mouth: Mucous membranes are moist.  Cardiovascular:     Rate and Rhythm: Normal rate and regular rhythm.     Pulses: Normal pulses.     Heart sounds: Normal heart sounds.  Pulmonary:     Effort: Pulmonary effort is normal.     Breath sounds: Normal breath sounds.  Abdominal:     General: Bowel sounds are normal.     Palpations: Abdomen  is soft.  Musculoskeletal:        General: Normal range of motion.     Cervical back: Normal range of motion.     Right lower leg: No edema.     Left lower leg: No edema.  Skin:    General: Skin is warm and dry.     Capillary Refill: Capillary refill  takes less than 2 seconds.  Neurological:     General: No focal deficit present.     Mental Status: She is alert and oriented to person, place, and time.  Psychiatric:        Mood and Affect: Mood normal.        Behavior: Behavior normal.        Thought Content: Thought content normal.        Judgment: Judgment normal.    BP (!) 127/52    Pulse 88    Temp (!) 97.3 F (36.3 C)    Ht 5\' 5"  (1.651 m)    Wt 164 lb (74.4 kg)    LMP 06/02/2013    SpO2 99%    BMI 27.29 kg/m  Wt Readings from Last 3 Encounters:  12/07/21 164 lb (74.4 kg)  09/26/21 165 lb (74.8 kg)  07/05/21 160 lb (72.6 kg)     Health Maintenance Due  Topic Date Due   OPHTHALMOLOGY EXAM  Never done   Zoster Vaccines- Shingrix (1 of 2) Never done   FOOT EXAM  02/10/2021   HEMOGLOBIN A1C  11/04/2021    There are no preventive care reminders to display for this patient.  Lab Results  Component Value Date   TSH <0.005 (L) 09/26/2021   Lab Results  Component Value Date   WBC 7.4 09/26/2021   HGB 12.9 09/26/2021   HCT 41.9 09/26/2021   MCV 73 (L) 09/26/2021   PLT 412 09/26/2021   Lab Results  Component Value Date   NA 143 09/26/2021   K 5.1 09/26/2021   CO2 23 09/26/2021   GLUCOSE 144 (H) 09/26/2021   BUN 10 09/26/2021   CREATININE 0.37 (L) 09/26/2021   BILITOT 0.3 09/26/2021   ALKPHOS 142 (H) 09/26/2021   AST 19 09/26/2021   ALT 37 (H) 09/26/2021   PROT 7.2 09/26/2021   ALBUMIN 4.3 09/26/2021   CALCIUM 10.2 09/26/2021   ANIONGAP 10 11/08/2016   EGFR 116 09/26/2021   GFR 204.34 12/15/2019   Lab Results  Component Value Date   CHOL 232 (H) 05/04/2021   Lab Results  Component Value Date   HDL 45 05/04/2021   Lab Results  Component Value Date    LDLCALC 167 (H) 05/04/2021   Lab Results  Component Value Date   TRIG 111 05/04/2021   Lab Results  Component Value Date   CHOLHDL 5.2 (H) 05/04/2021   Lab Results  Component Value Date   HGBA1C 9.0 (A) 05/04/2021   HGBA1C 9.0 05/04/2021   HGBA1C 9.0 (A) 05/04/2021   HGBA1C 9.0 (A) 05/04/2021      Assessment & Plan:   Problem List Items Addressed This Visit       Endocrine   Hyperthyroidism Stable  Encouraged follow up with endo   Type 2 diabetes mellitus with hyperglycemia, without long-term current use of insulin (HCC) Persistent and uncontrolled Encourage compliance with current treatment regimen  Encourage regular CBG monitoring Encourage contacting office if excessive hyperglycemia and or hypoglycemia Lifestyle modification with healthy diet (fewer calories, more high fiber foods, whole grains and non-starchy vegetables, lower fat meat and fish, low-fat diary include healthy oils) regular exercise (physical activity) and weight loss Opthalmology exam discussed  Nutrition discussed Regular dental visits encouraged Home BP monitoring also encouraged goal <130/80    Relevant Medications   metFORMIN (GLUCOPHAGE) 500 MG tablet   simvastatin (ZOCOR) 20 MG tablet   JANUVIA 100 MG tablet   Other Relevant Orders  Microalbumin/Creatinine Ratio, Urine (Completed)   Comp. Metabolic Panel (12) (Completed)     Other   Dyslipidemia Stable Stable  Encouraged on going compliance with current medication regimen. I recommend eating a heart-healthy diet; low in fat and cholesterol along with regular exercise. This will promote weight loss and improve cholesterol.     Relevant Medications   simvastatin (ZOCOR) 20 MG tablet   Other Relevant Orders   Lipid panel (Completed)   Other Visit Diagnoses     Essential hypertension    -  Primary   Relevant Medications   simvastatin (ZOCOR) 20 MG tablet   amLODipine (NORVASC) 10 MG tablet   Tinnitus of right ear     ENT  follow up Desires ear drop however this is not ideal treatment   Insomnia, unspecified type     Melatonin to promote sleep        Meds ordered this encounter  Medications   Melatonin 10 MG CAPS    Sig: Take 20 mg by mouth at bedtime as needed.    Dispense:  60 capsule    Refill:  2    Do not place this medication, or any other prescription from our practice, on "Automatic Refill". Patient may have prescription filled one day early if pharmacy is closed on scheduled refill date.    Order Specific Question:   Supervising Provider    Answer:   Tresa Garter [[id number]]   metFORMIN (GLUCOPHAGE) 500 MG tablet    Sig: TAKE 2 TABLETS BY MOUTH 2 TIMES DAILY WITH A MEAL    Dispense:  360 tablet    Refill:  3    Order Specific Question:   Supervising Provider    Answer:   Tresa Garter [[id number]]   pantoprazole (PROTONIX) 40 MG tablet    Sig: TAKE 1 TABLET BY MOUTH ONCE A DAY    Dispense:  90 tablet    Refill:  3    Order Specific Question:   Supervising Provider    Answer:   Tresa Garter [[id number]]   simvastatin (ZOCOR) 20 MG tablet    Sig: TAKE 1 TABLET BY MOUTH EVERY EVENING    Dispense:  90 tablet    Refill:  3    Order Specific Question:   Supervising Provider    Answer:   Tresa Garter [[id number]]   amLODipine (NORVASC) 10 MG tablet    Sig: TAKE 1 TABLET BY MOUTH ONCE A DAY    Dispense:  90 tablet    Refill:  3    Order Specific Question:   Supervising Provider    Answer:   Angelica Chessman E [[id number]]   JANUVIA 100 MG tablet    Sig: TAKE 1 TABLET BY MOUTH ONCE A DAY    Dispense:  90 tablet    Refill:  3    Order Specific Question:   Supervising Provider    AnswerTresa Garter [id number]    Follow-up: Return in about 3 months (around 03/09/2022) for follow up [id number], Follow up [id number].    Vevelyn Francois, NP

## 2021-12-08 LAB — COMP. METABOLIC PANEL (12)
AST: 29 IU/L (ref 0–40)
Albumin/Globulin Ratio: 1.3 (ref 1.2–2.2)
Albumin: 4.1 g/dL (ref 3.8–4.9)
Alkaline Phosphatase: 131 IU/L — ABNORMAL HIGH (ref 44–121)
BUN/Creatinine Ratio: 21 (ref 9–23)
BUN: 8 mg/dL (ref 6–24)
Bilirubin Total: 0.3 mg/dL (ref 0.0–1.2)
Calcium: 9.6 mg/dL (ref 8.7–10.2)
Chloride: 99 mmol/L (ref 96–106)
Creatinine, Ser: 0.38 mg/dL — ABNORMAL LOW (ref 0.57–1.00)
Globulin, Total: 3.1 g/dL (ref 1.5–4.5)
Glucose: 142 mg/dL — ABNORMAL HIGH (ref 70–99)
Potassium: 4.6 mmol/L (ref 3.5–5.2)
Sodium: 139 mmol/L (ref 134–144)
Total Protein: 7.2 g/dL (ref 6.0–8.5)
eGFR: 115 mL/min/{1.73_m2} (ref >59)

## 2021-12-08 LAB — LIPID PANEL
Chol/HDL Ratio: 6.2 ratio — ABNORMAL HIGH (ref 0.0–4.4)
Cholesterol, Total: 247 mg/dL — ABNORMAL HIGH (ref 100–199)
HDL: 40 mg/dL (ref >39)
LDL Chol Calc (NIH): 150 mg/dL — ABNORMAL HIGH (ref 0–99)
Triglycerides: 310 mg/dL — ABNORMAL HIGH (ref 0–149)
VLDL Cholesterol Cal: 57 mg/dL — ABNORMAL HIGH (ref 5–40)

## 2021-12-09 LAB — MICROALBUMIN / CREATININE URINE RATIO
Creatinine, Urine: 16.7 mg/dL
Microalb/Creat Ratio: <18 mg/g creat (ref 0–29)
Microalbumin, Urine: <3 ug/mL

## 2022-02-01 ENCOUNTER — Other Ambulatory Visit (HOSPITAL_COMMUNITY): Payer: Self-pay

## 2022-02-01 ENCOUNTER — Other Ambulatory Visit: Payer: Self-pay | Admitting: Nurse Practitioner

## 2022-03-08 ENCOUNTER — Ambulatory Visit (INDEPENDENT_AMBULATORY_CARE_PROVIDER_SITE_OTHER): Payer: Self-pay | Admitting: Nurse Practitioner

## 2022-03-08 ENCOUNTER — Other Ambulatory Visit (HOSPITAL_COMMUNITY): Payer: Self-pay

## 2022-03-08 ENCOUNTER — Encounter: Payer: Self-pay | Admitting: Nurse Practitioner

## 2022-03-08 VITALS — BP 141/69 | HR 93 | Temp 97.9°F | Wt 164.0 lb

## 2022-03-08 DIAGNOSIS — E1165 Type 2 diabetes mellitus with hyperglycemia: Secondary | ICD-10-CM

## 2022-03-08 DIAGNOSIS — E785 Hyperlipidemia, unspecified: Secondary | ICD-10-CM

## 2022-03-08 DIAGNOSIS — K219 Gastro-esophageal reflux disease without esophagitis: Secondary | ICD-10-CM

## 2022-03-08 DIAGNOSIS — I10 Essential (primary) hypertension: Secondary | ICD-10-CM

## 2022-03-08 LAB — POCT GLYCOSYLATED HEMOGLOBIN (HGB A1C)
HbA1c POC (<> result, manual entry): 10 % (ref 4.0–5.6)
HbA1c, POC (controlled diabetic range): 10 % — AB (ref 0.0–7.0)
HbA1c, POC (prediabetic range): 10 % — AB (ref 5.7–6.4)
Hemoglobin A1C: 10 % — AB (ref 4.0–5.6)

## 2022-03-08 MED ORDER — PANTOPRAZOLE SODIUM 40 MG PO TBEC
40.0000 mg | DELAYED_RELEASE_TABLET | Freq: Every day | ORAL | 3 refills | Status: DC
  Filled 2022-03-08: qty 90, 90d supply, fill #0

## 2022-03-08 MED ORDER — JANUVIA 100 MG PO TABS
100.0000 mg | ORAL_TABLET | Freq: Every day | ORAL | 3 refills | Status: DC
  Filled 2022-03-08: qty 90, 90d supply, fill #0

## 2022-03-08 MED ORDER — METFORMIN HCL 500 MG PO TABS
ORAL_TABLET | ORAL | 3 refills | Status: DC
  Filled 2022-03-08: qty 360, 90d supply, fill #0

## 2022-03-08 MED ORDER — SIMVASTATIN 20 MG PO TABS
ORAL_TABLET | Freq: Every evening | ORAL | 3 refills | Status: DC
  Filled 2022-03-08: qty 90, 90d supply, fill #0

## 2022-03-08 MED ORDER — ATENOLOL 25 MG PO TABS
25.0000 mg | ORAL_TABLET | Freq: Every day | ORAL | 2 refills | Status: DC
  Filled 2022-03-08: qty 30, 30d supply, fill #0

## 2022-03-08 MED ORDER — AMLODIPINE BESYLATE 10 MG PO TABS
10.0000 mg | ORAL_TABLET | Freq: Every day | ORAL | 0 refills | Status: DC
  Filled 2022-03-08 – 2022-04-19 (×2): qty 90, 90d supply, fill #0

## 2022-03-08 MED ORDER — FLUTICASONE PROPIONATE 50 MCG/ACT NA SUSP
1.0000 | Freq: Two times a day (BID) | NASAL | 5 refills | Status: DC | PRN
  Filled 2022-03-08: qty 16, 30d supply, fill #0

## 2022-03-08 MED ORDER — GLIMEPIRIDE 4 MG PO TABS
ORAL_TABLET | ORAL | 3 refills | Status: DC
  Filled 2022-03-08: qty 180, 90d supply, fill #0

## 2022-03-08 NOTE — Patient Instructions (Signed)
1. Primary hypertension  - amLODipine (NORVASC) 10 MG tablet; Take 1 tablet (10 mg total) by mouth daily.  Dispense: 90 tablet; Refill: 0 - atenolol (TENORMIN) 25 MG tablet; Take 1 tablet (25 mg total) by mouth daily.  Dispense: 30 tablet; Refill: 2  2. Type 2 diabetes mellitus with hyperglycemia, without long-term current use of insulin (HCC)  - POCT glycosylated hemoglobin (Hb A1C) - glimepiride (AMARYL) 4 MG tablet; TAKE 2 TABLETS BY MOUTH ONCE A DAY BEFORE BREAKFAST  Dispense: 180 tablet; Refill: 3 - metFORMIN (GLUCOPHAGE) 500 MG tablet; TAKE 2 TABLETS BY MOUTH 2 TIMES DAILY WITH A MEAL  Dispense: 360 tablet; Refill: 3 - JANUVIA 100 MG tablet; TAKE 1 TABLET BY MOUTH ONCE A DAY  Dispense: 90 tablet; Refill: 3 - CBC - Comprehensive metabolic panel  3. Dyslipidemia  - simvastatin (ZOCOR) 20 MG tablet; TAKE 1 TABLET BY MOUTH EVERY EVENING  Dispense: 90 tablet; Refill: 3  4. Gastroesophageal reflux disease without esophagitis  - pantoprazole (PROTONIX) 40 MG tablet; TAKE 1 TABLET BY MOUTH ONCE A DAY  Dispense: 90 tablet; Refill: 3  Follow up:  Follow up in 3 months

## 2022-03-08 NOTE — Progress Notes (Signed)
$'@Patient's$  ID: Crystal [name], female    DOB: 1961-08-09, 61 y.o.   MRN: [id number]  Chief Complaint  Patient presents with   Medication Refill    Pt stated she is here for medication refill foe diabetes    Referring provider: Vevelyn Francois, NP   HPI  Crystal [name] presents for follow up. She  has a past medical history of Allergy, Diabetes mellitus, GERD (gastroesophageal reflux disease), Hyperlipidemia, Hypertension, Hyperthyroidism, and Recurrent upper respiratory infection (URI).    Crystal [name] is in today for diabetes follow up. The prescribed treatment is Glimepiride 4 mg daily, januvia 100 mg daily, metformin 500 mg BID along with statin therapy simvastatin. The reported use of treatment is  consistent with prescribed. There are no reported side effects from the treatment. Home glucose monitoring indicates a CBG range consistent with past. She reports left arm tingling that comes an goes. She denies any chest pain, chest tightness, shortness of breath, neck pain, nausea or vomiting.  Denies fever, chills, headache, dizziness, visual changes, polydipsia,  polyphagia , abdominal pain, polyuria, constipation, diarrhea, edema, numbness, burning of hand or feet.       Patient states that she is doing well today with no issues or concerns.   Allergies  Allergen Reactions   Aspirin Nausea And Vomiting   Tramadol     Upset stomach     Immunization History  Administered Date(s) Administered   Influenza,inj,Quad PF,6+ Mos 07/15/2017   Tdap 11/13/2016    Past Medical History:  Diagnosis Date   Allergy    Diabetes mellitus    GERD (gastroesophageal reflux disease)    Hyperlipidemia    Hypertension    Hyperthyroidism    Recurrent upper respiratory infection (URI)     Tobacco History: Social History   Tobacco Use  Smoking Status Never  Smokeless Tobacco Never   Counseling given: Not Answered   Outpatient Encounter Medications as of 03/08/2022   Medication Sig   acetaminophen (TYLENOL) 500 MG tablet Take 1,000 mg by mouth daily as needed for moderate pain.    blood glucose meter kit and supplies One Touch Verio. Use up to four times daily as directed. (FOR ICD-10 E10.9, E11.9).   cetirizine (ZYRTEC ALLERGY) 10 MG tablet Take 1 tablet (10 mg total) by mouth at bedtime.   ciprofloxacin-dexamethasone (CIPRODEX) OTIC suspension Place 4 drops into right ear twice a day for 7 days   Lancets (ONETOUCH ULTRASOFT) lancets Use as instructed   Melatonin 10 MG CAPS Take 20 mg by mouth at bedtime as needed.   methimazole (TAPAZOLE) 10 MG tablet Take 1 tablet (10 mg total) by mouth daily.   [DISCONTINUED] amLODipine (NORVASC) 10 MG tablet Take 1 tablet by mouth once daily   [DISCONTINUED] atenolol (TENORMIN) 25 MG tablet Take 1 tablet (25 mg total) by mouth daily.   [DISCONTINUED] fluticasone (FLONASE) 50 MCG/ACT nasal spray Place 1 spray into both nostrils 2 (two) times daily as needed (nasal congestion).   [DISCONTINUED] JANUVIA 100 MG tablet TAKE 1 TABLET BY MOUTH ONCE A DAY   [DISCONTINUED] metFORMIN (GLUCOPHAGE) 500 MG tablet TAKE 2 TABLETS BY MOUTH 2 TIMES DAILY WITH A MEAL   [DISCONTINUED] pantoprazole (PROTONIX) 40 MG tablet TAKE 1 TABLET BY MOUTH ONCE A DAY   [DISCONTINUED] simvastatin (ZOCOR) 20 MG tablet TAKE 1 TABLET BY MOUTH EVERY EVENING   amLODipine (NORVASC) 10 MG tablet Take 1 tablet (10 mg total) by mouth daily.   atenolol (TENORMIN) 25 MG tablet Take  1 tablet (25 mg total) by mouth daily.   fluticasone (FLONASE) 50 MCG/ACT nasal spray Place 1 spray into both nostrils 2 (two) times daily as needed (nasal congestion).   glimepiride (AMARYL) 4 MG tablet TAKE 2 TABLETS BY MOUTH ONCE A DAY BEFORE BREAKFAST   JANUVIA 100 MG tablet TAKE 1 TABLET BY MOUTH ONCE A DAY   metFORMIN (GLUCOPHAGE) 500 MG tablet TAKE 2 TABLETS BY MOUTH 2 TIMES DAILY WITH A MEAL   pantoprazole (PROTONIX) 40 MG tablet TAKE 1 TABLET BY MOUTH ONCE A DAY    simvastatin (ZOCOR) 20 MG tablet TAKE 1 TABLET BY MOUTH EVERY EVENING   [DISCONTINUED] glimepiride (AMARYL) 4 MG tablet TAKE 2 TABLETS BY MOUTH ONCE A DAY BEFORE BREAKFAST   Facility-Administered Encounter Medications as of 03/08/2022  Medication   MEDERMA gel     Review of Systems  Review of Systems  Constitutional: Negative.   HENT: Negative.    Cardiovascular: Negative.   Gastrointestinal: Negative.   Allergic/Immunologic: Negative.   Neurological: Negative.   Psychiatric/Behavioral: Negative.        Physical Exam  BP (!) 141/69   Pulse 93   Temp 97.9 F (36.6 C)   Wt 164 lb (74.4 kg)   LMP 06/02/2013   BMI 27.29 kg/m   Wt Readings from Last 5 Encounters:  03/08/22 164 lb (74.4 kg)  12/07/21 164 lb (74.4 kg)  09/26/21 165 lb (74.8 kg)  07/05/21 160 lb (72.6 kg)  05/04/21 156 lb (70.8 kg)     Physical Exam Vitals and nursing note reviewed.  Constitutional:      General: She is not in acute distress.    Appearance: She is well-developed.  Cardiovascular:     Rate and Rhythm: Normal rate and regular rhythm.  Pulmonary:     Effort: Pulmonary effort is normal.     Breath sounds: Normal breath sounds.  Neurological:     Mental Status: She is alert and oriented to person, place, and time.     Lab Results:  CBC    Component Value Date/Time   WBC 8.2 03/08/2022 1638   WBC 7.9 12/15/2019 1558   RBC 5.64 (H) 03/08/2022 1638   RBC 5.36 (H) 12/15/2019 1558   HGB 13.1 03/08/2022 1638   HCT 40.7 03/08/2022 1638   PLT 418 03/08/2022 1638   MCV 72 (L) 03/08/2022 1638   MCH 23.2 (L) 03/08/2022 1638   MCH 23.9 (L) 11/08/2016 1500   MCHC 32.2 03/08/2022 1638   MCHC 32.0 12/15/2019 1558   RDW 13.7 03/08/2022 1638   LYMPHSABS 2.7 09/26/2021 1547   MONOABS 0.5 12/15/2019 1558   EOSABS 0.1 09/26/2021 1547   BASOSABS 0.0 09/26/2021 1547    BMET    Component Value Date/Time   NA 141 03/08/2022 1638   K 4.2 03/08/2022 1638   CL 100 03/08/2022 1638   CO2 24  03/08/2022 1638   GLUCOSE 183 (H) 03/08/2022 1638   GLUCOSE 329 (H) 12/15/2019 1558   BUN 11 03/08/2022 1638   CREATININE 0.39 (L) 03/08/2022 1638   CREATININE 0.40 (L) 08/14/2017 1542   CALCIUM 9.9 03/08/2022 1638   GFRNONAA 113 08/11/2020 0950   GFRNONAA 117 08/14/2017 1542   GFRAA 131 08/11/2020 0950   GFRAA 136 08/14/2017 1542    BNP No results found for: BNP  ProBNP No results found for: PROBNP  Imaging: No results found.   Assessment & Plan:   Hypertension - amLODipine (NORVASC) 10 MG tablet; Take 1 tablet (  10 mg total) by mouth daily.  Dispense: 90 tablet; Refill: 0 - atenolol (TENORMIN) 25 MG tablet; Take 1 tablet (25 mg total) by mouth daily.  Dispense: 30 tablet; Refill: 2  2. Type 2 diabetes mellitus with hyperglycemia, without long-term current use of insulin (HCC)  - POCT glycosylated hemoglobin (Hb A1C) - glimepiride (AMARYL) 4 MG tablet; TAKE 2 TABLETS BY MOUTH ONCE A DAY BEFORE BREAKFAST  Dispense: 180 tablet; Refill: 3 - metFORMIN (GLUCOPHAGE) 500 MG tablet; TAKE 2 TABLETS BY MOUTH 2 TIMES DAILY WITH A MEAL  Dispense: 360 tablet; Refill: 3 - JANUVIA 100 MG tablet; TAKE 1 TABLET BY MOUTH ONCE A DAY  Dispense: 90 tablet; Refill: 3 - CBC - Comprehensive metabolic panel  3. Dyslipidemia  - simvastatin (ZOCOR) 20 MG tablet; TAKE 1 TABLET BY MOUTH EVERY EVENING  Dispense: 90 tablet; Refill: 3  4. Gastroesophageal reflux disease without esophagitis  - pantoprazole (PROTONIX) 40 MG tablet; TAKE 1 TABLET BY MOUTH ONCE A DAY  Dispense: 90 tablet; Refill: 3  Follow up:  Follow up in 3 months     Fenton Foy, NP 03/10/2022

## 2022-03-09 LAB — COMPREHENSIVE METABOLIC PANEL
ALT: 40 IU/L — ABNORMAL HIGH (ref 0–32)
AST: 22 IU/L (ref 0–40)
Albumin/Globulin Ratio: 1.3 (ref 1.2–2.2)
Albumin: 4 g/dL (ref 3.8–4.9)
Alkaline Phosphatase: 134 IU/L — ABNORMAL HIGH (ref 44–121)
BUN/Creatinine Ratio: 28 (ref 12–28)
BUN: 11 mg/dL (ref 8–27)
Bilirubin Total: 0.4 mg/dL (ref 0.0–1.2)
CO2: 24 mmol/L (ref 20–29)
Calcium: 9.9 mg/dL (ref 8.7–10.3)
Chloride: 100 mmol/L (ref 96–106)
Creatinine, Ser: 0.39 mg/dL — ABNORMAL LOW (ref 0.57–1.00)
Globulin, Total: 3 g/dL (ref 1.5–4.5)
Glucose: 183 mg/dL — ABNORMAL HIGH (ref 70–99)
Potassium: 4.2 mmol/L (ref 3.5–5.2)
Sodium: 141 mmol/L (ref 134–144)
Total Protein: 7 g/dL (ref 6.0–8.5)
eGFR: 114 mL/min/{1.73_m2} (ref >59)

## 2022-03-09 LAB — CBC
Hematocrit: 40.7 % (ref 34.0–46.6)
Hemoglobin: 13.1 g/dL (ref 11.1–15.9)
MCH: 23.2 pg — ABNORMAL LOW (ref 26.6–33.0)
MCHC: 32.2 g/dL (ref 31.5–35.7)
MCV: 72 fL — ABNORMAL LOW (ref 79–97)
Platelets: 418 10*3/uL (ref 150–450)
RBC: 5.64 x10E6/uL — ABNORMAL HIGH (ref 3.77–5.28)
RDW: 13.7 % (ref 11.7–15.4)
WBC: 8.2 10*3/uL (ref 3.4–10.8)

## 2022-03-10 ENCOUNTER — Encounter: Payer: Self-pay | Admitting: Nurse Practitioner

## 2022-03-10 NOTE — Assessment & Plan Note (Signed)
-   amLODipine (NORVASC) 10 MG tablet; Take 1 tablet (10 mg total) by mouth daily.  Dispense: 90 tablet; Refill: 0 - atenolol (TENORMIN) 25 MG tablet; Take 1 tablet (25 mg total) by mouth daily.  Dispense: 30 tablet; Refill: 2  2. Type 2 diabetes mellitus with hyperglycemia, without long-term current use of insulin (HCC)  - POCT glycosylated hemoglobin (Hb A1C) - glimepiride (AMARYL) 4 MG tablet; TAKE 2 TABLETS BY MOUTH ONCE A DAY BEFORE BREAKFAST  Dispense: 180 tablet; Refill: 3 - metFORMIN (GLUCOPHAGE) 500 MG tablet; TAKE 2 TABLETS BY MOUTH 2 TIMES DAILY WITH A MEAL  Dispense: 360 tablet; Refill: 3 - JANUVIA 100 MG tablet; TAKE 1 TABLET BY MOUTH ONCE A DAY  Dispense: 90 tablet; Refill: 3 - CBC - Comprehensive metabolic panel  3. Dyslipidemia  - simvastatin (ZOCOR) 20 MG tablet; TAKE 1 TABLET BY MOUTH EVERY EVENING  Dispense: 90 tablet; Refill: 3  4. Gastroesophageal reflux disease without esophagitis  - pantoprazole (PROTONIX) 40 MG tablet; TAKE 1 TABLET BY MOUTH ONCE A DAY  Dispense: 90 tablet; Refill: 3  Follow up:  Follow up in 3 months

## 2022-03-15 ENCOUNTER — Ambulatory Visit: Payer: Self-pay | Admitting: Nurse Practitioner

## 2022-03-26 NOTE — Progress Notes (Deleted)
Follow Up Note  RE: Crystal [name] MRN: [id number] DOB: 1961-05-29 Date of Office Visit: 03/27/2022  Referring provider: Vevelyn Francois, NP Primary care provider: Vevelyn Francois, NP  Chief Complaint: No chief complaint on file.  History of Present Illness: I had the pleasure of seeing Crystal [name] for a follow up visit at the [facility] on 03/26/2022. She is a 61 y.o. female, who is being followed for allergic rhinitis, pruritus. Her previous allergy office visit was on 09/26/2021 with Dr. Maudie Mercury. Today is a regular follow up visit.  Other allergic rhinitis Perennial rhinitis symptoms with ear pressure and right-sided neck pain for the last 3 years.  Unknown triggers.  Tried Zyrtec and Flonase with minimal benefit.  No prior allergy evaluation.  Saw ENT and was told she may have TMJ.  No prior rhinoscopy. Today's skin testing showed: Positive to cockroach and borderline to mold mix #4. Start environmental control measures as below. Patient denies having cockroach/mold issues. I doubt these allergens are contributing to her symptoms. And environmental  allergies typically do not cause unilateral symptoms. Use Flonase (fluticasone) nasal spray 1 spray per nostril twice a day as needed for nasal congestion.  Nasal saline spray (i.e., Simply Saline) or nasal saline lavage (i.e., NeilMed) is recommended as needed and prior to medicated nasal sprays. Refer to ENT - for chronic nasal congestion and ear pain.   Pruritus Pruritus for the last 3 years mainly on her face.  Has not tried any antihistamines.  Uses Vaseline and perfume. Has uncontrolled diabetes. Today's skin testing showed: Positive to cockroach and borderline to mold mix #4. Negative to common foods. See below for proper skin care. Do not use anything with fragrances/scents.  No perfume, no dryer sheets, no fabric softener.  Moisturize daily. Take zyrtec $RemoveBef'10mg'DRUdqFludd$  daily to help with the itching.  Get  bloodwork to rule out other etiologies.   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 Patient was not sure what TMJ was. Handout given in Arabic.   Return in about 6 months (around 03/27/2022).  Assessment and Plan: Crystal [name] is a 61 y.o. female with: No problem-specific Assessment & Plan notes found for this encounter.  No follow-ups on file.  No orders of the defined types were placed in this encounter.  Lab Orders  No laboratory test(s) ordered today    Diagnostics: Spirometry:  Tracings reviewed. Her effort: {Blank single:19197::"Good reproducible efforts.","It was hard to get consistent efforts and there is a question as to whether this reflects a maximal maneuver.","Poor effort, data can not be interpreted."} FVC: ***L FEV1: ***L, ***% predicted FEV1/FVC ratio: ***% Interpretation: {Blank single:19197::"Spirometry consistent with mild obstructive disease","Spirometry consistent with moderate obstructive disease","Spirometry consistent with severe obstructive disease","Spirometry consistent with possible restrictive disease","Spirometry consistent with mixed obstructive and restrictive disease","Spirometry uninterpretable due to technique","Spirometry consistent with normal pattern","No overt abnormalities noted given today's efforts"}.  Please see scanned spirometry results for details.  Skin Testing: {Blank single:19197::"Select foods","Environmental allergy panel","Environmental allergy panel and select foods","Food allergy panel","None","Deferred due to recent antihistamines use"}. *** Results discussed with patient/family.   Medication List:  Current Outpatient Medications  Medication Sig Dispense Refill  . acetaminophen (TYLENOL) 500 MG tablet Take 1,000 mg by mouth daily as needed for moderate pain.     [name] amLODipine (NORVASC) 10 MG tablet Take 1 tablet (10 mg total) by mouth daily. 90 tablet 0  . atenolol  (TENORMIN) 25  MG tablet Take 1 tablet (25 mg total) by mouth daily. 30 tablet 2  . blood glucose meter kit and supplies One Touch Verio. Use up to four times daily as directed. (FOR ICD-10 E10.9, E11.9). 1 each 0  . cetirizine (ZYRTEC ALLERGY) 10 MG tablet Take 1 tablet (10 mg total) by mouth at bedtime. 30 tablet 3  . ciprofloxacin-dexamethasone (CIPRODEX) OTIC suspension Place 4 drops into right ear twice a day for 7 days 7.5 mL 0  . fluticasone (FLONASE) 50 MCG/ACT nasal spray Place 1 spray into both nostrils 2 (two) times daily as needed (nasal congestion). 16 g 5  . glimepiride (AMARYL) 4 MG tablet TAKE 2 TABLETS BY MOUTH ONCE A DAY BEFORE BREAKFAST 180 tablet 3  . JANUVIA 100 MG tablet TAKE 1 TABLET BY MOUTH ONCE A DAY 90 tablet 3  . Lancets (ONETOUCH ULTRASOFT) lancets Use as instructed 100 each 12  . Melatonin 10 MG CAPS Take 20 mg by mouth at bedtime as needed. 60 capsule 2  . metFORMIN (GLUCOPHAGE) 500 MG tablet TAKE 2 TABLETS BY MOUTH 2 TIMES DAILY WITH A MEAL 360 tablet 3  . methimazole (TAPAZOLE) 10 MG tablet Take 1 tablet (10 mg total) by mouth daily. 90 tablet 3  . pantoprazole (PROTONIX) 40 MG tablet TAKE 1 TABLET BY MOUTH ONCE A DAY 90 tablet 3  . simvastatin (ZOCOR) 20 MG tablet TAKE 1 TABLET BY MOUTH EVERY EVENING 90 tablet 3   Current Facility-Administered Medications  Medication Dose Route Frequency Provider Last Rate Last Admin  . MEDERMA gel   Topical Once [name], MD       Allergies: Allergies  Allergen Reactions  . Aspirin Nausea And Vomiting  . Tramadol     Upset stomach    I reviewed her past medical history, social history, family history, and environmental history and no significant changes have been reported from her previous visit.  Review of Systems  Constitutional:  Negative for appetite change, chills, fever and unexpected weight change.  HENT:  Positive for congestion and sinus pressure. Negative for rhinorrhea.   Eyes:  Negative for itching.   Respiratory:  Negative for cough, chest tightness, shortness of breath and wheezing.   Cardiovascular:  Negative for chest pain.  Gastrointestinal:  Negative for abdominal pain.  Genitourinary:  Negative for difficulty urinating.  Musculoskeletal:  Positive for neck pain.  Skin:  Negative for rash.       itching  Allergic/Immunologic: Positive for environmental allergies. Negative for food allergies.  Neurological:  Positive for headaches.   Objective: LMP 06/02/2013  There is no height or weight on file to calculate BMI. Physical Exam Vitals and nursing note reviewed.  Constitutional:      Appearance: Normal appearance. She is well-developed.  HENT:     Head: Normocephalic and atraumatic.     Right Ear: Tympanic membrane and external ear normal.     Left Ear: Tympanic membrane and external ear normal.     Nose: Nose normal.     Mouth/Throat:     Mouth: Mucous membranes are moist.     Pharynx: Oropharynx is clear.  Eyes:     Conjunctiva/sclera: Conjunctivae normal.  Cardiovascular:     Rate and Rhythm: Normal rate and regular rhythm.     Heart sounds: Normal heart sounds. No murmur heard.    No friction rub. No gallop.  Pulmonary:     Effort: Pulmonary effort is normal.     Breath sounds: Normal breath sounds.  No wheezing, rhonchi or rales.  Musculoskeletal:     Cervical back: Neck supple.  Skin:    General: Skin is warm.     Findings: No rash.     Comments: Hyperpigmentation noted on the face.  Neurological:     Mental Status: She is alert and oriented to person, place, and time.  Psychiatric:        Behavior: Behavior normal.  Previous notes and tests were reviewed. The plan was reviewed with the patient/family, and all questions/concerned were addressed.  It was my pleasure to see Crystal [name] today and participate in her care. Please feel free to contact me with any questions or concerns.  Sincerely,  Rexene Alberts, DO Allergy & Immunology  [facility] office: [place] office: [phone]

## 2022-03-27 ENCOUNTER — Ambulatory Visit: Payer: 59 | Admitting: Allergy

## 2022-03-27 DIAGNOSIS — J309 Allergic rhinitis, unspecified: Secondary | ICD-10-CM

## 2022-03-27 DIAGNOSIS — J3089 Other allergic rhinitis: Secondary | ICD-10-CM

## 2022-03-27 DIAGNOSIS — L299 Pruritus, unspecified: Secondary | ICD-10-CM

## 2022-04-19 ENCOUNTER — Emergency Department (HOSPITAL_COMMUNITY)
Admission: EM | Admit: 2022-04-19 | Discharge: 2022-04-19 | Disposition: A | Discharge status: Home / Self Care | Payer: 59 | Attending: Emergency Medicine | Admitting: Emergency Medicine

## 2022-04-19 ENCOUNTER — Other Ambulatory Visit: Payer: Self-pay

## 2022-04-19 ENCOUNTER — Encounter (HOSPITAL_COMMUNITY): Payer: Self-pay

## 2022-04-19 ENCOUNTER — Other Ambulatory Visit (HOSPITAL_COMMUNITY): Payer: Self-pay

## 2022-04-19 ENCOUNTER — Emergency Department (HOSPITAL_COMMUNITY): Payer: 59

## 2022-04-19 DIAGNOSIS — Z79899 Other long term (current) drug therapy: Secondary | ICD-10-CM | POA: Diagnosis not present

## 2022-04-19 DIAGNOSIS — W010XXA Fall on same level from slipping, tripping and stumbling without subsequent striking against object, initial encounter: Secondary | ICD-10-CM | POA: Diagnosis not present

## 2022-04-19 DIAGNOSIS — E119 Type 2 diabetes mellitus without complications: Secondary | ICD-10-CM | POA: Insufficient documentation

## 2022-04-19 DIAGNOSIS — S3992XA Unspecified injury of lower back, initial encounter: Secondary | ICD-10-CM | POA: Insufficient documentation

## 2022-04-19 DIAGNOSIS — Z7984 Long term (current) use of oral hypoglycemic drugs: Secondary | ICD-10-CM | POA: Insufficient documentation

## 2022-04-19 DIAGNOSIS — M7918 Myalgia, other site: Secondary | ICD-10-CM

## 2022-04-19 MED ORDER — IBUPROFEN 800 MG PO TABS
800.0000 mg | ORAL_TABLET | Freq: Once | ORAL | Status: AC
  Administered 2022-04-19: 800 mg via ORAL
  Filled 2022-04-19: qty 1

## 2022-04-19 MED ORDER — METHOCARBAMOL 500 MG PO TABS
500.0000 mg | ORAL_TABLET | Freq: Two times a day (BID) | ORAL | 0 refills | Status: DC
  Filled 2022-04-19: qty 25, 13d supply, fill #0

## 2022-04-19 NOTE — ED Provider Notes (Signed)
[facility] DEPT Provider Note   CSN: [id number] Arrival date & time: 04/19/22  1428     History  Chief Complaint  Patient presents with   Crystal [name] Plan Sharri Loya is a 61 y.o. female who past medical history significant for diabetes, previously diagnosed spinal stenosis of cervical region who presents with concern for fall onto buttocks around 5 days ago where she slipped.  Patient reports that she landed hard on the buttocks and coccyx.  She reports hip and tailbone pain.  She reports that she has been taking Tylenol for the pain but pain usually returns about 4 hours after taking Tylenol.  She reports that it is not affecting her ability to walk, she has no numbness, tingling, she reports that it is difficult to sit down and get up.  Patient denies any previous history of IV drug use, chronic corticosteroid use, fever, saddle anesthesia, history of cancer, difficulty with urination or defecation.   Fall       Home Medications Prior to Admission medications   Medication Sig Start Date End Date Taking? Authorizing Provider  methocarbamol (ROBAXIN) 500 MG tablet Take 1 tablet (500 mg total) by mouth 2 (two) times daily. 04/19/22  Yes Taten Merrow H, PA-C  acetaminophen (TYLENOL) 500 MG tablet Take 1,000 mg by mouth daily as needed for moderate pain.     [provider]  amLODipine (NORVASC) 10 MG tablet Take 1 tablet (10 mg total) by mouth daily. 03/08/22 06/06/22  Fenton Foy, NP  atenolol (TENORMIN) 25 MG tablet Take 1 tablet (25 mg total) by mouth daily. 03/08/22 06/06/22  Fenton Foy, NP  blood glucose meter kit and supplies One Touch Verio. Use up to four times daily as directed. (FOR ICD-10 E10.9, E11.9). 02/11/20   Vevelyn Francois, NP  cetirizine (ZYRTEC ALLERGY) 10 MG tablet Take 1 tablet (10 mg total) by mouth at bedtime. 09/26/21   Garnet Sierras, DO  ciprofloxacin-dexamethasone (CIPRODEX) OTIC suspension Place 4 drops into  right ear twice a day for 7 days 06/08/21   Lynden Oxford Scales, PA-C  fluticasone [facility]) 50 MCG/ACT nasal spray Place 1 spray into both nostrils 2 (two) times daily as needed (nasal congestion). 03/08/22   Fenton Foy, NP  glimepiride (AMARYL) 4 MG tablet TAKE 2 TABLETS BY MOUTH ONCE A DAY BEFORE BREAKFAST 03/08/22 03/08/23  Fenton Foy, NP  JANUVIA 100 MG tablet TAKE 1 TABLET BY MOUTH ONCE A DAY 03/08/22 03/08/23  Fenton Foy, NP  Lancets [facility] ULTRASOFT) lancets Use as instructed 02/11/20   Vevelyn Francois, NP  Melatonin 10 MG CAPS Take 20 mg by mouth at bedtime as needed. 12/07/21   Vevelyn Francois, NP  metFORMIN (GLUCOPHAGE) 500 MG tablet TAKE 2 TABLETS BY MOUTH 2 TIMES DAILY WITH A MEAL 03/08/22 03/08/23  Fenton Foy, NP  methimazole (TAPAZOLE) 10 MG tablet Take 1 tablet (10 mg total) by mouth daily. 05/04/21 05/04/22  Vevelyn Francois, NP  pantoprazole (PROTONIX) 40 MG tablet TAKE 1 TABLET BY MOUTH ONCE A DAY 03/08/22 03/08/23  Fenton Foy, NP  simvastatin (ZOCOR) 20 MG tablet TAKE 1 TABLET BY MOUTH EVERY EVENING 03/08/22 03/08/23  Fenton Foy, NP      Allergies    Aspirin and Tramadol    Review of Systems   Review of Systems  Musculoskeletal:  Positive for back pain.  All other systems reviewed and are negative.   Physical Exam  Updated Vital Signs BP (!) 176/79   Pulse 96   Temp 98.6 F (37 C) (Oral)   Resp 18   Ht _0  (1.626 m)   Wt 73.5 kg   LMP 06/02/2013   SpO2 97%   BMI 27.81 kg/m  Physical Exam Vitals and nursing note reviewed.  Constitutional:      General: She is not in acute distress.    Appearance: Normal appearance.  HENT:     Head: Normocephalic and atraumatic.  Eyes:     General:        Right eye: No discharge.        Left eye: No discharge.  Cardiovascular:     Rate and Rhythm: Normal rate and regular rhythm.     Pulses: Normal pulses.  Pulmonary:     Effort: Pulmonary effort is normal. No respiratory distress.  Musculoskeletal:         General: No deformity.     Comments: Some tenderness to palpation of the right buttocks around bony prominences, no significant midline spinal tenderness.  Intact strength 5 out of 5 bilateral upper and lower extremities.  No step-off or deformity.  Patient can walk and ambulate without difficulty.  No instability of hips noted.  Skin:    General: Skin is warm and dry.     Capillary Refill: Capillary refill takes less than 2 seconds.  Neurological:     Mental Status: She is alert and oriented to person, place, and time.  Psychiatric:        Mood and Affect: Mood normal.        Behavior: Behavior normal.     ED Results / Procedures / Treatments   Labs (all labs ordered are listed, but only abnormal results are displayed) Labs Reviewed - No data to display  EKG None  Radiology DG Sacrum/Coccyx  Result Date: 04/19/2022 CLINICAL DATA:  Fall, buttock pain EXAM: SACRUM AND COCCYX - 2+ VIEW COMPARISON:  None Available. FINDINGS: There is no evidence of fracture or other focal bone lesions. IMPRESSION: Negative. Electronically Signed   By: Rolm Baptise M.D.   On: 04/19/2022 15:25    Procedures Procedures    Medications Ordered in ED Medications  ibuprofen (ADVIL) tablet 800 mg (800 mg Oral Given 04/19/22 1522)    ED Course/ Medical Decision Making/ A&P                           Medical Decision Making Amount and/or Complexity of Data Reviewed Radiology: ordered.  Risk Prescription drug management.   This an overall well-appearing 61 year old female with past medical history significant for diabetes, previous cervical stenosis who presents with concern for tailbone pain after fall 5 days ago.  Patient reports no difficulty with walking but has some difficulty with sitting and standing up due to the pain in her buttocks.  She denies any numbness, tingling.  She has no red flags of back pain including chronic or the steroid use, previous cancer, IV drug use, saddle anesthesia,  difficulty with urination or defecation.  She is intact strength bilaterally, and neurovascularly intact bilateral lower extremities on my exam.  She has some tenderness around the coccyx and the soft tissue of the buttocks.  I independently interpreted radiographic imaging including plain film of the sacrum and coccyx.  Imaging shows no acute fracture, dislocation, I agree with radiologist interpretation.  Encouraged continued Tylenol, will prescribe muscle relaxant, encouraged orthopedic follow-up.  Patient discharged  in stable condition at this time, return precautions given. Final Clinical Impression(s) / ED Diagnoses Final diagnoses:  Injury of coccyx, initial encounter  Buttock pain    Rx / DC Orders ED Discharge Orders          Ordered    methocarbamol (ROBAXIN) 500 MG tablet  2 times daily        04/19/22 1550              Sufian Ravi, [place], PA-C 04/19/22 1552    Blanchie Dessert, MD 04/19/22 2339

## 2022-04-19 NOTE — Discharge Instructions (Signed)
You can use up to 1000 mg of Tylenol every 6 hours for pain.  You can additionally use the muscle relaxant I am prescribing twice daily, be cautious as it may make you drowsy.  I recommend following up with your primary care doctor, please follow-up with orthopedics if you have persistent pain despite 2 weeks of treatment.

## 2022-04-19 NOTE — ED Triage Notes (Signed)
Patient states she fell 5 days ago because her shoes slipped. Patient states she landed on her buttocks. Patient c/o left hip and tailbone pain.

## 2022-06-03 ENCOUNTER — Telehealth: Payer: Self-pay | Admitting: Nurse Practitioner

## 2022-06-03 NOTE — Telephone Encounter (Signed)
Metformin and glimepiride refill request

## 2022-06-27 ENCOUNTER — Other Ambulatory Visit (HOSPITAL_COMMUNITY): Payer: Self-pay

## 2022-06-27 ENCOUNTER — Ambulatory Visit (INDEPENDENT_AMBULATORY_CARE_PROVIDER_SITE_OTHER): Payer: Self-pay | Admitting: Nurse Practitioner

## 2022-06-27 ENCOUNTER — Encounter: Payer: Self-pay | Admitting: Nurse Practitioner

## 2022-06-27 DIAGNOSIS — E785 Hyperlipidemia, unspecified: Secondary | ICD-10-CM

## 2022-06-27 DIAGNOSIS — I10 Essential (primary) hypertension: Secondary | ICD-10-CM

## 2022-06-27 DIAGNOSIS — K219 Gastro-esophageal reflux disease without esophagitis: Secondary | ICD-10-CM

## 2022-06-27 DIAGNOSIS — E1165 Type 2 diabetes mellitus with hyperglycemia: Secondary | ICD-10-CM

## 2022-06-27 LAB — POCT GLYCOSYLATED HEMOGLOBIN (HGB A1C)
HbA1c POC (<> result, manual entry): 10.4 % (ref 4.0–5.6)
HbA1c, POC (controlled diabetic range): 10.4 % — AB (ref 0.0–7.0)
HbA1c, POC (prediabetic range): 10.4 % — AB (ref 5.7–6.4)
Hemoglobin A1C: 10.4 % — AB (ref 4.0–5.6)

## 2022-06-27 MED ORDER — METFORMIN HCL 500 MG PO TABS
ORAL_TABLET | ORAL | 3 refills | Status: DC
Start: 2022-06-27 — End: 2022-09-26
  Filled 2022-06-27 – 2022-08-14 (×2): qty 360, 90d supply, fill #0

## 2022-06-27 MED ORDER — AMLODIPINE BESYLATE 10 MG PO TABS
10.0000 mg | ORAL_TABLET | Freq: Every day | ORAL | 0 refills | Status: DC
  Filled 2022-06-27: qty 30, 30d supply, fill #0
  Filled 2022-09-17: qty 30, 30d supply, fill #1

## 2022-06-27 MED ORDER — GLIMEPIRIDE 4 MG PO TABS
ORAL_TABLET | ORAL | 3 refills | Status: DC
Start: 2022-06-27 — End: 2022-09-26
  Filled 2022-06-27: qty 60, 30d supply, fill #0
  Filled 2022-08-14: qty 60, 30d supply, fill #1
  Filled 2022-09-13: qty 60, 30d supply, fill #2

## 2022-06-27 MED ORDER — JANUVIA 100 MG PO TABS
100.0000 mg | ORAL_TABLET | Freq: Every day | ORAL | 3 refills | Status: DC
  Filled 2022-06-27: qty 90, 90d supply, fill #0
  Filled 2022-08-14 – 2022-10-16 (×2): qty 30, 30d supply, fill #0
  Filled 2022-11-15: qty 30, 30d supply, fill #1
  Filled 2022-12-26 – 2023-01-10 (×2): qty 30, 30d supply, fill #2
  Filled 2023-01-29 – 2023-02-03 (×2): qty 30, 30d supply, fill #3

## 2022-06-27 MED ORDER — SIMVASTATIN 20 MG PO TABS
20.0000 mg | ORAL_TABLET | Freq: Every evening | ORAL | 3 refills | Status: DC
  Filled 2022-06-27: qty 90, fill #0
  Filled 2022-08-14: qty 90, 90d supply, fill #0

## 2022-06-27 MED ORDER — PANTOPRAZOLE SODIUM 40 MG PO TBEC
40.0000 mg | DELAYED_RELEASE_TABLET | Freq: Every day | ORAL | 3 refills | Status: DC
  Filled 2022-06-27: qty 90, 90d supply, fill #0

## 2022-06-27 MED ORDER — METHIMAZOLE 10 MG PO TABS
10.0000 mg | ORAL_TABLET | Freq: Every day | ORAL | 3 refills | Status: DC
  Filled 2022-06-27 – 2022-08-14 (×2): qty 90, 90d supply, fill #0

## 2022-06-27 MED ORDER — ATENOLOL 25 MG PO TABS
25.0000 mg | ORAL_TABLET | Freq: Every day | ORAL | 2 refills | Status: DC
  Filled 2022-06-27 – 2022-08-14 (×2): qty 30, 30d supply, fill #0
  Filled 2022-09-13: qty 30, 30d supply, fill #1

## 2022-06-27 NOTE — Progress Notes (Signed)
$'@Patient'W$  ID: Crystal [name], female    DOB: 1961-04-16, 61 y.o.   MRN: [id number]  Chief Complaint  Patient presents with   Follow-up    Pt is here for follow up visit. Pt is requesting refill on all medication    Referring provider: Vevelyn Francois, NP   HPI  Crystal [name] presents for follow up. She  has a past medical history of Allergy, Diabetes mellitus, GERD (gastroesophageal reflux disease), Hyperlipidemia, Hypertension, Hyperthyroidism, and Recurrent upper respiratory infection (URI).   Crystal [name] is in today for diabetes and hypertension follow up. Patient is non complaint with hypertension medications. The prescribed treatment is Glimepiride 4 mg daily, januvia 100 mg daily, metformin 500 mg BID along with statin therapy simvastatin. Patient has been noncompliant with medications. Did not take blood pressure medication today. Has been out of diabetic medications. There are no reported side effects from the treatment. Home glucose monitoring indicates a CBG range consistent with past. She denies any chest pain, chest tightness, shortness of breath, neck pain, nausea or vomiting.  Denies fever, chills, headache, dizziness, visual changes, polydipsia,  polyphagia , abdominal pain, polyuria, constipation, diarrhea, edema, numbness, burning of hand or feet.            Allergies  Allergen Reactions   Aspirin Nausea And Vomiting   Tramadol     Upset stomach     Immunization History  Administered Date(s) Administered   Influenza,inj,Quad PF,6+ Mos 07/15/2017   Tdap 11/13/2016    Past Medical History:  Diagnosis Date   Allergy    Diabetes mellitus    GERD (gastroesophageal reflux disease)    Hyperlipidemia    Hypertension    Hyperthyroidism    Recurrent upper respiratory infection (URI)     Tobacco History: Social History   Tobacco Use  Smoking Status Never  Smokeless Tobacco Never   Counseling given: Not Answered   Outpatient Encounter  Medications as of 06/27/2022  Medication Sig   acetaminophen (TYLENOL) 500 MG tablet Take 1,000 mg by mouth daily as needed for moderate pain.    blood glucose meter kit and supplies One Touch Verio. Use up to four times daily as directed. (FOR ICD-10 E10.9, E11.9).   cetirizine (ZYRTEC ALLERGY) 10 MG tablet Take 1 tablet (10 mg total) by mouth at bedtime.   ciprofloxacin-dexamethasone (CIPRODEX) OTIC suspension Place 4 drops into right ear twice a day for 7 days   fluticasone (FLONASE) 50 MCG/ACT nasal spray Place 1 spray into both nostrils 2 (two) times daily as needed (nasal congestion).   Lancets (ONETOUCH ULTRASOFT) lancets Use as instructed   Melatonin 10 MG CAPS Take 20 mg by mouth at bedtime as needed.   methocarbamol (ROBAXIN) 500 MG tablet Take 1 tablet by mouth 2 times daily.   [DISCONTINUED] amLODipine (NORVASC) 10 MG tablet Take 1 tablet by mouth daily.   [DISCONTINUED] glimepiride (AMARYL) 4 MG tablet TAKE 2 TABLETS BY MOUTH ONCE A DAY BEFORE BREAKFAST   [DISCONTINUED] JANUVIA 100 MG tablet TAKE 1 TABLET BY MOUTH ONCE A DAY   [DISCONTINUED] metFORMIN (GLUCOPHAGE) 500 MG tablet TAKE 2 TABLETS BY MOUTH 2 TIMES DAILY WITH A MEAL   [DISCONTINUED] pantoprazole (PROTONIX) 40 MG tablet TAKE 1 TABLET BY MOUTH ONCE A DAY   [DISCONTINUED] simvastatin (ZOCOR) 20 MG tablet TAKE 1 TABLET BY MOUTH EVERY EVENING   amLODipine (NORVASC) 10 MG tablet Take 1 tablet by mouth daily.   atenolol (TENORMIN) 25 MG tablet Take 1 tablet (25 mg total)  by mouth daily.   glimepiride (AMARYL) 4 MG tablet TAKE 2 TABLETS BY MOUTH ONCE A DAY BEFORE BREAKFAST   JANUVIA 100 MG tablet TAKE 1 TABLET BY MOUTH ONCE A DAY   metFORMIN (GLUCOPHAGE) 500 MG tablet TAKE 2 TABLETS BY MOUTH 2 TIMES DAILY WITH A MEAL   methimazole (TAPAZOLE) 10 MG tablet Take 1 tablet (10 mg total) by mouth daily.   pantoprazole (PROTONIX) 40 MG tablet TAKE 1 TABLET BY MOUTH ONCE A DAY   simvastatin (ZOCOR) 20 MG tablet TAKE 1 TABLET BY MOUTH  EVERY EVENING   [DISCONTINUED] atenolol (TENORMIN) 25 MG tablet Take 1 tablet (25 mg total) by mouth daily.   [DISCONTINUED] methimazole (TAPAZOLE) 10 MG tablet Take 1 tablet (10 mg total) by mouth daily.   Facility-Administered Encounter Medications as of 06/27/2022  Medication   MEDERMA gel     Review of Systems  Review of Systems  Constitutional: Negative.   HENT: Negative.    Cardiovascular: Negative.   Gastrointestinal: Negative.   Allergic/Immunologic: Negative.   Neurological: Negative.   Psychiatric/Behavioral: Negative.         Physical Exam  BP (!) 164/65 (BP Location: Right Arm, Patient Position: Sitting, Cuff Size: Large)   Pulse 91   Temp (!) 97.5 F (36.4 C)   Ht $R'5\' 5"'KU$  (1.651 m)   Wt 159 lb 2 oz (72.2 kg)   LMP 06/02/2013   SpO2 100%   BMI 26.48 kg/m   Wt Readings from Last 5 Encounters:  06/27/22 159 lb 2 oz (72.2 kg)  04/19/22 162 lb (73.5 kg)  03/08/22 164 lb (74.4 kg)  12/07/21 164 lb (74.4 kg)  09/26/21 165 lb (74.8 kg)     Physical Exam Vitals and nursing note reviewed.  Constitutional:      General: She is not in acute distress.    Appearance: She is well-developed.  Cardiovascular:     Rate and Rhythm: Normal rate and regular rhythm.  Pulmonary:     Effort: Pulmonary effort is normal.     Breath sounds: Normal breath sounds.  Neurological:     Mental Status: She is alert and oriented to person, place, and time.      Lab Results:  CBC    Component Value Date/Time   WBC 8.2 03/08/2022 1638   WBC 7.9 12/15/2019 1558   RBC 5.64 (H) 03/08/2022 1638   RBC 5.36 (H) 12/15/2019 1558   HGB 13.1 03/08/2022 1638   HCT 40.7 03/08/2022 1638   PLT 418 03/08/2022 1638   MCV 72 (L) 03/08/2022 1638   MCH 23.2 (L) 03/08/2022 1638   MCH 23.9 (L) 11/08/2016 1500   MCHC 32.2 03/08/2022 1638   MCHC 32.0 12/15/2019 1558   RDW 13.7 03/08/2022 1638   LYMPHSABS 2.7 09/26/2021 1547   MONOABS 0.5 12/15/2019 1558   EOSABS 0.1 09/26/2021 1547    BASOSABS 0.0 09/26/2021 1547    BMET    Component Value Date/Time   NA 141 03/08/2022 1638   K 4.2 03/08/2022 1638   CL 100 03/08/2022 1638   CO2 24 03/08/2022 1638   GLUCOSE 183 (H) 03/08/2022 1638   GLUCOSE 329 (H) 12/15/2019 1558   BUN 11 03/08/2022 1638   CREATININE 0.39 (L) 03/08/2022 1638   CREATININE 0.40 (L) 08/14/2017 1542   CALCIUM 9.9 03/08/2022 1638   GFRNONAA 113 08/11/2020 0950   GFRNONAA 117 08/14/2017 1542   GFRAA 131 08/11/2020 0950   GFRAA 136 08/14/2017 1542  Assessment & Plan:   Type 2 diabetes mellitus with hyperglycemia, without long-term current use of insulin (HCC) - glimepiride (AMARYL) 4 MG tablet; TAKE 2 TABLETS BY MOUTH ONCE A DAY BEFORE BREAKFAST  Dispense: 180 tablet; Refill: 3 - metFORMIN (GLUCOPHAGE) 500 MG tablet; TAKE 2 TABLETS BY MOUTH 2 TIMES DAILY WITH A MEAL  Dispense: 360 tablet; Refill: 3 - JANUVIA 100 MG tablet; TAKE 1 TABLET BY MOUTH ONCE A DAY  Dispense: 90 tablet; Refill: 3  2. Primary hypertension  - atenolol (TENORMIN) 25 MG tablet; Take 1 tablet (25 mg total) by mouth daily.  Dispense: 30 tablet; Refill: 2 - amLODipine (NORVASC) 10 MG tablet; Take 1 tablet by mouth daily.  Dispense: 90 tablet; Refill: 0 - CBC - Comprehensive metabolic panel - TSH  3. Gastroesophageal reflux disease without esophagitis  - pantoprazole (PROTONIX) 40 MG tablet; TAKE 1 TABLET BY MOUTH ONCE A DAY  Dispense: 90 tablet; Refill: 3  4. Dyslipidemia  - simvastatin (ZOCOR) 20 MG tablet; TAKE 1 TABLET BY MOUTH EVERY EVENING  Dispense: 90 tablet; Refill: 3  Follow up:  Follow up in 3 months     Fenton Foy, NP 06/27/2022

## 2022-06-27 NOTE — Patient Instructions (Addendum)
1. Type 2 diabetes mellitus with hyperglycemia, without long-term current use of insulin (HCC)  Lab Results  Component Value Date   HGBA1C 10.0 (A) 03/08/2022   HGBA1C 10.0 03/08/2022   HGBA1C 10.0 (A) 03/08/2022   HGBA1C 10.0 (A) 03/08/2022     - glimepiride (AMARYL) 4 MG tablet; TAKE 2 TABLETS BY MOUTH ONCE A DAY BEFORE BREAKFAST  Dispense: 180 tablet; Refill: 3 - metFORMIN (GLUCOPHAGE) 500 MG tablet; TAKE 2 TABLETS BY MOUTH 2 TIMES DAILY WITH A MEAL  Dispense: 360 tablet; Refill: 3 - JANUVIA 100 MG tablet; TAKE 1 TABLET BY MOUTH ONCE A DAY  Dispense: 90 tablet; Refill: 3  2. Primary hypertension  - atenolol (TENORMIN) 25 MG tablet; Take 1 tablet (25 mg total) by mouth daily.  Dispense: 30 tablet; Refill: 2 - amLODipine (NORVASC) 10 MG tablet; Take 1 tablet by mouth daily.  Dispense: 90 tablet; Refill: 0 - CBC - Comprehensive metabolic panel - TSH  3. Gastroesophageal reflux disease without esophagitis  - pantoprazole (PROTONIX) 40 MG tablet; TAKE 1 TABLET BY MOUTH ONCE A DAY  Dispense: 90 tablet; Refill: 3  4. Dyslipidemia  - simvastatin (ZOCOR) 20 MG tablet; TAKE 1 TABLET BY MOUTH EVERY EVENING  Dispense: 90 tablet; Refill: 3  Follow up:  Follow up in 3 months

## 2022-06-27 NOTE — Assessment & Plan Note (Signed)
-   glimepiride (AMARYL) 4 MG tablet; TAKE 2 TABLETS BY MOUTH ONCE A DAY BEFORE BREAKFAST  Dispense: 180 tablet; Refill: 3 - metFORMIN (GLUCOPHAGE) 500 MG tablet; TAKE 2 TABLETS BY MOUTH 2 TIMES DAILY WITH A MEAL  Dispense: 360 tablet; Refill: 3 - JANUVIA 100 MG tablet; TAKE 1 TABLET BY MOUTH ONCE A DAY  Dispense: 90 tablet; Refill: 3  2. Primary hypertension  - atenolol (TENORMIN) 25 MG tablet; Take 1 tablet (25 mg total) by mouth daily.  Dispense: 30 tablet; Refill: 2 - amLODipine (NORVASC) 10 MG tablet; Take 1 tablet by mouth daily.  Dispense: 90 tablet; Refill: 0 - CBC - Comprehensive metabolic panel - TSH  3. Gastroesophageal reflux disease without esophagitis  - pantoprazole (PROTONIX) 40 MG tablet; TAKE 1 TABLET BY MOUTH ONCE A DAY  Dispense: 90 tablet; Refill: 3  4. Dyslipidemia  - simvastatin (ZOCOR) 20 MG tablet; TAKE 1 TABLET BY MOUTH EVERY EVENING  Dispense: 90 tablet; Refill: 3  Follow up:  Follow up in 3 months

## 2022-06-28 LAB — CBC
Hematocrit: 39.9 % (ref 34.0–46.6)
Hemoglobin: 12.5 g/dL (ref 11.1–15.9)
MCH: 22.6 pg — ABNORMAL LOW (ref 26.6–33.0)
MCHC: 31.3 g/dL — ABNORMAL LOW (ref 31.5–35.7)
MCV: 72 fL — ABNORMAL LOW (ref 79–97)
Platelets: 389 10*3/uL (ref 150–450)
RBC: 5.53 x10E6/uL — ABNORMAL HIGH (ref 3.77–5.28)
RDW: 13.7 % (ref 11.7–15.4)
WBC: 9.1 10*3/uL (ref 3.4–10.8)

## 2022-06-28 LAB — COMPREHENSIVE METABOLIC PANEL
ALT: 34 IU/L — ABNORMAL HIGH (ref 0–32)
AST: 19 IU/L (ref 0–40)
Albumin/Globulin Ratio: 1.3 (ref 1.2–2.2)
Albumin: 4 g/dL (ref 3.8–4.9)
Alkaline Phosphatase: 114 IU/L (ref 44–121)
BUN/Creatinine Ratio: 31 — ABNORMAL HIGH (ref 12–28)
BUN: 11 mg/dL (ref 8–27)
Bilirubin Total: 0.3 mg/dL (ref 0.0–1.2)
CO2: 20 mmol/L (ref 20–29)
Calcium: 9.5 mg/dL (ref 8.7–10.3)
Chloride: 100 mmol/L (ref 96–106)
Creatinine, Ser: 0.36 mg/dL — ABNORMAL LOW (ref 0.57–1.00)
Globulin, Total: 3 g/dL (ref 1.5–4.5)
Glucose: 171 mg/dL — ABNORMAL HIGH (ref 70–99)
Potassium: 4 mmol/L (ref 3.5–5.2)
Sodium: 141 mmol/L (ref 134–144)
Total Protein: 7 g/dL (ref 6.0–8.5)
eGFR: 116 mL/min/{1.73_m2} (ref >59)

## 2022-06-28 LAB — TSH: TSH: <0.005 u[IU]/mL — ABNORMAL LOW (ref 0.450–4.500)

## 2022-08-14 ENCOUNTER — Other Ambulatory Visit (HOSPITAL_COMMUNITY): Payer: Self-pay

## 2022-08-15 ENCOUNTER — Other Ambulatory Visit: Payer: Self-pay | Admitting: Nurse Practitioner

## 2022-08-15 DIAGNOSIS — E119 Type 2 diabetes mellitus without complications: Secondary | ICD-10-CM

## 2022-08-16 ENCOUNTER — Telehealth: Payer: Self-pay

## 2022-08-16 NOTE — Progress Notes (Signed)
     Care Guide Note  08/16/2022 Name: Crystal [name] MRN: [id number] DOB: [birth year]  Referred by: Vevelyn Francois, NP Reason for referral : Care Coordination (Outreach to schedule with Pharm D )   Crystal [name] is a 61 y.o. year old female who is a primary care patient of Vevelyn Francois, NP. Crystal [name] was referred to the pharmacist for assistance related to DM.    An unsuccessful telephone outreach was attempted today to contact the patient who was referred to the pharmacy team for assistance with medication management. Additional attempts will be made to contact the patient.   Crystal [name], [place], [place] [address] Direct Dial: [phone] [name]

## 2022-08-27 NOTE — Progress Notes (Signed)
   Care Guide Note  08/27/2022 Name: Crystal [name] MRN: [id number] DOB: 1961/01/05  Referred by: Vevelyn Francois, NP Reason for referral : Care Coordination (Outreach to schedule with Pharm D )   Crystal [name] is a 61 y.o. year old female who is a primary care patient of Vevelyn Francois, NP. Crystal [name] was referred to the pharmacist for assistance related to DM.    A second unsuccessful telephone outreach was attempted today to contact the patient who was referred to the pharmacy team for assistance with medication management. Additional attempts will be made to contact the patient.  Crystal [name], [place], [place] [address] Direct Dial: [phone] [name]

## 2022-09-05 NOTE — Progress Notes (Signed)
   Care Guide Note  09/05/2022 Name: Chestine Belknap GPCWTP MRN: [id number] DOB: 1960/11/23  Referred by: Vevelyn Francois, NP Reason for referral : Care Coordination (Outreach to schedule with Pharm D )   Kathyleen Estee Yohe is a 61 y.o. year old female who is a primary care patient of Vevelyn Francois, NP. Lorry Furber Heuberger was referred to the pharmacist for assistance related to DM.    A third unsuccessful telephone outreach was attempted today to contact the patient who was referred to the pharmacy team for assistance with medication management. The Population Health team is pleased to engage with this patient at any time in the future upon receipt of referral and should he/she be interested in assistance from the [facility] team.   Noreene Larsson, [place], [place] [address] Direct Dial: [phone] [email or web address]

## 2022-09-13 ENCOUNTER — Other Ambulatory Visit (HOSPITAL_COMMUNITY): Payer: Self-pay

## 2022-09-17 ENCOUNTER — Other Ambulatory Visit (HOSPITAL_COMMUNITY): Payer: Self-pay

## 2022-09-26 ENCOUNTER — Ambulatory Visit: Payer: Self-pay | Admitting: Nurse Practitioner

## 2022-09-26 ENCOUNTER — Encounter: Payer: Self-pay | Admitting: Nurse Practitioner

## 2022-09-26 ENCOUNTER — Ambulatory Visit (INDEPENDENT_AMBULATORY_CARE_PROVIDER_SITE_OTHER): Payer: Commercial Managed Care - HMO | Admitting: Nurse Practitioner

## 2022-09-26 ENCOUNTER — Other Ambulatory Visit (HOSPITAL_COMMUNITY): Payer: Self-pay

## 2022-09-26 VITALS — BP 140/60 | HR 113 | Ht 65.0 in | Wt 162.0 lb

## 2022-09-26 DIAGNOSIS — K219 Gastro-esophageal reflux disease without esophagitis: Secondary | ICD-10-CM

## 2022-09-26 DIAGNOSIS — E119 Type 2 diabetes mellitus without complications: Secondary | ICD-10-CM | POA: Diagnosis not present

## 2022-09-26 DIAGNOSIS — I10 Essential (primary) hypertension: Secondary | ICD-10-CM | POA: Diagnosis not present

## 2022-09-26 DIAGNOSIS — Z1231 Encounter for screening mammogram for malignant neoplasm of breast: Secondary | ICD-10-CM

## 2022-09-26 DIAGNOSIS — H547 Unspecified visual loss: Secondary | ICD-10-CM

## 2022-09-26 DIAGNOSIS — E1165 Type 2 diabetes mellitus with hyperglycemia: Secondary | ICD-10-CM

## 2022-09-26 DIAGNOSIS — E785 Hyperlipidemia, unspecified: Secondary | ICD-10-CM

## 2022-09-26 LAB — POCT GLYCOSYLATED HEMOGLOBIN (HGB A1C): Hemoglobin A1C: 9.7 % — AB (ref 4.0–5.6)

## 2022-09-26 MED ORDER — GLIMEPIRIDE 4 MG PO TABS
8.0000 mg | ORAL_TABLET | Freq: Every day | ORAL | 3 refills | Status: DC
  Filled 2022-09-26: qty 180, fill #0
  Filled 2022-10-16: qty 180, 90d supply, fill #0
  Filled 2023-01-29 (×3): qty 180, 90d supply, fill #1

## 2022-09-26 MED ORDER — METHIMAZOLE 10 MG PO TABS
10.0000 mg | ORAL_TABLET | Freq: Every day | ORAL | 3 refills | Status: DC
  Filled 2022-09-26 – 2023-01-10 (×3): qty 90, 90d supply, fill #0

## 2022-09-26 MED ORDER — PANTOPRAZOLE SODIUM 40 MG PO TBEC
40.0000 mg | DELAYED_RELEASE_TABLET | Freq: Every day | ORAL | 3 refills | Status: DC
  Filled 2022-09-26: qty 90, 90d supply, fill #0
  Filled 2022-12-26: qty 30, 30d supply, fill #1
  Filled 2023-01-10: qty 90, 90d supply, fill #1

## 2022-09-26 MED ORDER — AMLODIPINE BESYLATE 10 MG PO TABS
10.0000 mg | ORAL_TABLET | Freq: Every day | ORAL | 0 refills | Status: DC
  Filled 2022-09-26 – 2022-10-16 (×2): qty 90, 90d supply, fill #0

## 2022-09-26 MED ORDER — SIMVASTATIN 20 MG PO TABS
20.0000 mg | ORAL_TABLET | Freq: Every evening | ORAL | 3 refills | Status: DC
  Filled 2022-09-26: qty 90, 90d supply, fill #0

## 2022-09-26 MED ORDER — ATENOLOL 25 MG PO TABS
25.0000 mg | ORAL_TABLET | Freq: Every day | ORAL | 2 refills | Status: DC
  Filled 2022-09-26 – 2022-10-16 (×2): qty 30, 30d supply, fill #0
  Filled 2022-12-26 – 2023-01-10 (×2): qty 30, 30d supply, fill #1

## 2022-09-26 MED ORDER — METFORMIN HCL 500 MG PO TABS
1000.0000 mg | ORAL_TABLET | Freq: Two times a day (BID) | ORAL | 3 refills | Status: DC
  Filled 2022-09-26: qty 360, fill #0
  Filled 2022-11-15: qty 360, 90d supply, fill #0
  Filled 2023-01-29: qty 360, 90d supply, fill #1

## 2022-09-26 MED ORDER — CETIRIZINE HCL 10 MG PO TABS
10.0000 mg | ORAL_TABLET | Freq: Every day | ORAL | 3 refills | Status: DC
  Filled 2022-09-26: qty 30, 30d supply, fill #0

## 2022-09-26 NOTE — Assessment & Plan Note (Signed)
-   POCT glycosylated hemoglobin (Hb A1C) - AMB Referral to Pharmacy Medication Management  2. Decreased vision  - Ambulatory referral to Ophthalmology  3. Encounter for screening mammogram for malignant neoplasm of breast  - MM Digital Screening  4. Primary hypertension  - amLODipine (NORVASC) 10 MG tablet; Take 1 tablet by mouth daily.  Dispense: 90 tablet; Refill: 0 - atenolol (TENORMIN) 25 MG tablet; Take 1 tablet (25 mg total) by mouth daily.  Dispense: 30 tablet; Refill: 2  5. Type 2 diabetes mellitus with hyperglycemia, without long-term current use of insulin (HCC)  - glimepiride (AMARYL) 4 MG tablet; TAKE 2 TABLETS BY MOUTH ONCE A DAY BEFORE BREAKFAST  Dispense: 180 tablet; Refill: 3 - metFORMIN (GLUCOPHAGE) 500 MG tablet; TAKE 2 TABLETS BY MOUTH 2 TIMES DAILY WITH A MEAL  Dispense: 360 tablet; Refill: 3  6. Gastroesophageal reflux disease without esophagitis  - pantoprazole (PROTONIX) 40 MG tablet; TAKE 1 TABLET BY MOUTH ONCE A DAY  Dispense: 90 tablet; Refill: 3  7. Dyslipidemia  - simvastatin (ZOCOR) 20 MG tablet; Take 1 tablet (20 mg total) by mouth every evening.  Dispense: 90 tablet; Refill: 3  Follow up:  Follow up in 3 months or sooner if needed

## 2022-09-26 NOTE — Patient Instructions (Signed)
1. Type 2 diabetes mellitus without complication, without long-term current use of insulin (HCC)  - POCT glycosylated hemoglobin (Hb A1C) - AMB Referral to Pharmacy Medication Management  2. Decreased vision  - Ambulatory referral to Ophthalmology  3. Encounter for screening mammogram for malignant neoplasm of breast  - MM Digital Screening  4. Primary hypertension  - amLODipine (NORVASC) 10 MG tablet; Take 1 tablet by mouth daily.  Dispense: 90 tablet; Refill: 0 - atenolol (TENORMIN) 25 MG tablet; Take 1 tablet (25 mg total) by mouth daily.  Dispense: 30 tablet; Refill: 2  5. Type 2 diabetes mellitus with hyperglycemia, without long-term current use of insulin (HCC)  - glimepiride (AMARYL) 4 MG tablet; TAKE 2 TABLETS BY MOUTH ONCE A DAY BEFORE BREAKFAST  Dispense: 180 tablet; Refill: 3 - metFORMIN (GLUCOPHAGE) 500 MG tablet; TAKE 2 TABLETS BY MOUTH 2 TIMES DAILY WITH A MEAL  Dispense: 360 tablet; Refill: 3  6. Gastroesophageal reflux disease without esophagitis  - pantoprazole (PROTONIX) 40 MG tablet; TAKE 1 TABLET BY MOUTH ONCE A DAY  Dispense: 90 tablet; Refill: 3  7. Dyslipidemia  - simvastatin (ZOCOR) 20 MG tablet; Take 1 tablet (20 mg total) by mouth every evening.  Dispense: 90 tablet; Refill: 3  Follow up:  Follow up in 3 months or sooner if needed

## 2022-09-26 NOTE — Progress Notes (Signed)
_0  ID: Crystal [name], female    DOB: 1961-04-01, 61 y.o.   MRN: [id number]  Chief Complaint  Patient presents with   Follow-up    Referring provider: Vevelyn Francois, NP   HPI  Crystal [name] presents for follow up. She  has a past medical history of Allergy, Diabetes mellitus, GERD (gastroesophageal reflux disease), Hyperlipidemia, Hypertension, Hyperthyroidism, and Recurrent upper respiratory infection (URI).    Ms. Digman is in today for diabetes and hypertension follow up. Patient is non complaint with hypertension medications. The prescribed treatment is Glimepiride 4 mg daily, januvia 100 mg daily, metformin 1,000 mg BID, januvia along with statin therapy simvastatin. Has been out of diabetic medications. There are no reported side effects from the treatment. Home glucose monitoring indicates a CBG range consistent with past. She denies any chest pain, chest tightness, shortness of breath, neck pain, nausea or vomiting.  Denies fever, chills, headache, dizziness, visual changes, polydipsia,  polyphagia , abdominal pain, polyuria, constipation, diarrhea, edema, numbness, burning of hand or feet.   Lab Results  Component Value Date   HGBA1C 9.7 (A) 09/26/2022       Allergies  Allergen Reactions   Aspirin Nausea And Vomiting   Tramadol     Upset stomach     Immunization History  Administered Date(s) Administered   Influenza,inj,Quad PF,6+ Mos 07/15/2017   Tdap 11/13/2016    Past Medical History:  Diagnosis Date   Allergy    Diabetes mellitus    GERD (gastroesophageal reflux disease)    Hyperlipidemia    Hypertension    Hyperthyroidism    Recurrent upper respiratory infection (URI)     Tobacco History: Social History   Tobacco Use  Smoking Status Never  Smokeless Tobacco Never   Counseling given: Not Answered   Outpatient Encounter Medications as of 09/26/2022  Medication Sig   fluticasone (FLONASE) 50 MCG/ACT nasal spray Place 1  spray into both nostrils 2 (two) times daily as needed (nasal congestion).   JANUVIA 100 MG tablet Take 1 tablet (100 mg total) by mouth daily.   Lancets (ONETOUCH ULTRASOFT) lancets Use as instructed   [DISCONTINUED] amLODipine (NORVASC) 10 MG tablet Take 1 tablet by mouth daily.   [DISCONTINUED] atenolol (TENORMIN) 25 MG tablet Take 1 tablet (25 mg total) by mouth daily.   [DISCONTINUED] cetirizine (ZYRTEC ALLERGY) 10 MG tablet Take 1 tablet (10 mg total) by mouth at bedtime.   [DISCONTINUED] glimepiride (AMARYL) 4 MG tablet TAKE 2 TABLETS BY MOUTH ONCE A DAY BEFORE BREAKFAST   [DISCONTINUED] Melatonin 10 MG CAPS Take 20 mg by mouth at bedtime as needed.   [DISCONTINUED] metFORMIN (GLUCOPHAGE) 500 MG tablet TAKE 2 TABLETS BY MOUTH 2 TIMES DAILY WITH A MEAL   [DISCONTINUED] methimazole (TAPAZOLE) 10 MG tablet Take 1 tablet (10 mg total) by mouth daily.   [DISCONTINUED] simvastatin (ZOCOR) 20 MG tablet Take 1 tablet (20 mg total) by mouth every evening.   acetaminophen (TYLENOL) 500 MG tablet Take 1,000 mg by mouth daily as needed for moderate pain.    amLODipine (NORVASC) 10 MG tablet Take 1 tablet by mouth daily.   atenolol (TENORMIN) 25 MG tablet Take 1 tablet (25 mg total) by mouth daily.   blood glucose meter kit and supplies One Touch Verio. Use up to four times daily as directed. (FOR ICD-10 E10.9, E11.9).   cetirizine (ZYRTEC ALLERGY) 10 MG tablet Take 1 tablet (10 mg total) by mouth at bedtime.   glimepiride (AMARYL) 4 MG tablet  TAKE 2 TABLETS BY MOUTH ONCE A DAY BEFORE BREAKFAST   metFORMIN (GLUCOPHAGE) 500 MG tablet Take 2 tablets (1,000 mg total) by mouth 2 (two) times daily with a meal.   methimazole (TAPAZOLE) 10 MG tablet Take 1 tablet (10 mg total) by mouth daily.   methocarbamol (ROBAXIN) 500 MG tablet Take 1 tablet by mouth 2 times daily.   pantoprazole (PROTONIX) 40 MG tablet TAKE 1 TABLET BY MOUTH ONCE A DAY   simvastatin (ZOCOR) 20 MG tablet Take 1 tablet (20 mg total) by  mouth every evening.   [DISCONTINUED] ciprofloxacin-dexamethasone (CIPRODEX) OTIC suspension Place 4 drops into right ear twice a day for 7 days   [DISCONTINUED] pantoprazole (PROTONIX) 40 MG tablet TAKE 1 TABLET BY MOUTH ONCE A DAY   Facility-Administered Encounter Medications as of 09/26/2022  Medication   MEDERMA gel     Review of Systems  Review of Systems  Constitutional: Negative.   HENT: Negative.    Cardiovascular: Negative.   Gastrointestinal: Negative.   Allergic/Immunologic: Negative.   Neurological: Negative.   Psychiatric/Behavioral: Negative.         Physical Exam  BP (!) 140/60   Pulse (!) 113   Ht _0  (1.651 m)   Wt 162 lb (73.5 kg)   LMP 06/02/2013   BMI 26.96 kg/m   Wt Readings from Last 5 Encounters:  09/26/22 162 lb (73.5 kg)  06/27/22 159 lb 2 oz (72.2 kg)  04/19/22 162 lb (73.5 kg)  03/08/22 164 lb (74.4 kg)  12/07/21 164 lb (74.4 kg)     Physical Exam Vitals and nursing note reviewed.  Constitutional:      General: She is not in acute distress.    Appearance: She is well-developed.  Cardiovascular:     Rate and Rhythm: Normal rate and regular rhythm.  Pulmonary:     Effort: Pulmonary effort is normal.     Breath sounds: Normal breath sounds.  Neurological:     Mental Status: She is alert and oriented to person, place, and time.      Lab Results:  CBC    Component Value Date/Time   WBC 9.1 06/27/2022 1601   WBC 7.9 12/15/2019 1558   RBC 5.53 (H) 06/27/2022 1601   RBC 5.36 (H) 12/15/2019 1558   HGB 12.5 06/27/2022 1601   HCT 39.9 06/27/2022 1601   PLT 389 06/27/2022 1601   MCV 72 (L) 06/27/2022 1601   MCH 22.6 (L) 06/27/2022 1601   MCH 23.9 (L) 11/08/2016 1500   MCHC 31.3 (L) 06/27/2022 1601   MCHC 32.0 12/15/2019 1558   RDW 13.7 06/27/2022 1601   LYMPHSABS 2.7 09/26/2021 1547   MONOABS 0.5 12/15/2019 1558   EOSABS 0.1 09/26/2021 1547   BASOSABS 0.0 09/26/2021 1547    BMET    Component Value Date/Time   NA  141 06/27/2022 1601   K 4.0 06/27/2022 1601   CL 100 06/27/2022 1601   CO2 20 06/27/2022 1601   GLUCOSE 171 (H) 06/27/2022 1601   GLUCOSE 329 (H) 12/15/2019 1558   BUN 11 06/27/2022 1601   CREATININE 0.36 (L) 06/27/2022 1601   CREATININE 0.40 (L) 08/14/2017 1542   CALCIUM 9.5 06/27/2022 1601   GFRNONAA 113 08/11/2020 0950   GFRNONAA 117 08/14/2017 1542   GFRAA 131 08/11/2020 0950   GFRAA 136 08/14/2017 1542      Assessment & Plan:   Type 2 diabetes mellitus with hyperglycemia, without long-term current use of insulin (HCC) - POCT glycosylated hemoglobin (Hb  A1C) - AMB Referral to Pharmacy Medication Management  2. Decreased vision  - Ambulatory referral to Ophthalmology  3. Encounter for screening mammogram for malignant neoplasm of breast  - MM Digital Screening  4. Primary hypertension  - amLODipine (NORVASC) 10 MG tablet; Take 1 tablet by mouth daily.  Dispense: 90 tablet; Refill: 0 - atenolol (TENORMIN) 25 MG tablet; Take 1 tablet (25 mg total) by mouth daily.  Dispense: 30 tablet; Refill: 2  5. Type 2 diabetes mellitus with hyperglycemia, without long-term current use of insulin (HCC)  - glimepiride (AMARYL) 4 MG tablet; TAKE 2 TABLETS BY MOUTH ONCE A DAY BEFORE BREAKFAST  Dispense: 180 tablet; Refill: 3 - metFORMIN (GLUCOPHAGE) 500 MG tablet; TAKE 2 TABLETS BY MOUTH 2 TIMES DAILY WITH A MEAL  Dispense: 360 tablet; Refill: 3  6. Gastroesophageal reflux disease without esophagitis  - pantoprazole (PROTONIX) 40 MG tablet; TAKE 1 TABLET BY MOUTH ONCE A DAY  Dispense: 90 tablet; Refill: 3  7. Dyslipidemia  - simvastatin (ZOCOR) 20 MG tablet; Take 1 tablet (20 mg total) by mouth every evening.  Dispense: 90 tablet; Refill: 3  Follow up:  Follow up in 3 months or sooner if needed     Fenton Foy, NP 09/26/2022

## 2022-10-16 ENCOUNTER — Other Ambulatory Visit (HOSPITAL_COMMUNITY): Payer: Self-pay

## 2022-10-21 ENCOUNTER — Telehealth: Payer: Self-pay | Admitting: Pharmacist

## 2022-10-21 NOTE — Progress Notes (Signed)
Contacted patient regarding referral for diabetes and medication management from Fenton Foy, NP .   Left patient a voicemail to return my call at their [place], PharmD, [facility] [phone]

## 2022-10-29 NOTE — Progress Notes (Signed)
Contacted patient regarding referral for medication management from Fenton Foy, NP .   Left patient a voicemail to return my call at their convenience. Third unsuccessful outreach attempt, will close referral.   Catie TJodi Mourning, PharmD, [facility] [phone]

## 2022-11-15 ENCOUNTER — Other Ambulatory Visit (HOSPITAL_COMMUNITY): Payer: Self-pay

## 2022-11-22 ENCOUNTER — Ambulatory Visit: Payer: Commercial Managed Care - HMO

## 2022-12-12 ENCOUNTER — Ambulatory Visit: Payer: Medicaid Other | Admitting: Orthopedic Surgery

## 2022-12-12 LAB — HM DIABETES EYE EXAM

## 2022-12-26 ENCOUNTER — Ambulatory Visit (INDEPENDENT_AMBULATORY_CARE_PROVIDER_SITE_OTHER): Payer: BLUE CROSS/BLUE SHIELD | Admitting: Nurse Practitioner

## 2022-12-26 ENCOUNTER — Encounter: Payer: Self-pay | Admitting: Nurse Practitioner

## 2022-12-26 ENCOUNTER — Other Ambulatory Visit (HOSPITAL_COMMUNITY): Payer: Self-pay

## 2022-12-26 VITALS — BP 145/54 | HR 97 | Temp 97.7°F | Ht 65.0 in | Wt 161.2 lb

## 2022-12-26 DIAGNOSIS — L659 Nonscarring hair loss, unspecified: Secondary | ICD-10-CM

## 2022-12-26 DIAGNOSIS — Z13220 Encounter for screening for lipoid disorders: Secondary | ICD-10-CM

## 2022-12-26 DIAGNOSIS — E559 Vitamin D deficiency, unspecified: Secondary | ICD-10-CM

## 2022-12-26 DIAGNOSIS — R238 Other skin changes: Secondary | ICD-10-CM | POA: Diagnosis not present

## 2022-12-26 DIAGNOSIS — E1165 Type 2 diabetes mellitus with hyperglycemia: Secondary | ICD-10-CM | POA: Diagnosis not present

## 2022-12-26 LAB — POCT GLYCOSYLATED HEMOGLOBIN (HGB A1C): Hemoglobin A1C: 9.3 % — AB (ref 4.0–5.6)

## 2022-12-26 MED ORDER — CICLOPIROX 1 % EX SHAM
1.0000 | MEDICATED_SHAMPOO | Freq: Every day | CUTANEOUS | 0 refills | Status: DC
  Filled 2022-12-26: qty 120, 30d supply, fill #0

## 2022-12-26 NOTE — Patient Instructions (Addendum)
1. Type 2 diabetes mellitus with hyperglycemia, without long-term current use of insulin (HCC)  - CBC - Comprehensive metabolic panel - Lipid Panel - AMB Referral to Pharmacy Medication Management  2. Lipid screening  - Lipid Panel  3. Dry scalp  - Ambulatory referral to Dermatology - Ciclopirox 1 % shampoo; Apply 1 Application topically at bedtime.  Dispense: 120 mL; Refill: 0  4. Hair loss  - Ambulatory referral to Dermatology - Ciclopirox 1 % shampoo; Apply 1 Application topically at bedtime.  Dispense: 120 mL; Refill: 0   Follow up:  Follow up in 3 months

## 2022-12-26 NOTE — Assessment & Plan Note (Signed)
-   CBC - Comprehensive metabolic panel - Lipid Panel - AMB Referral to Pharmacy Medication Management  2. Lipid screening  - Lipid Panel  3. Dry scalp  - Ambulatory referral to Dermatology - Ciclopirox 1 % shampoo; Apply 1 Application topically at bedtime.  Dispense: 120 mL; Refill: 0  4. Hair loss  - Ambulatory referral to Dermatology - Ciclopirox 1 % shampoo; Apply 1 Application topically at bedtime.  Dispense: 120 mL; Refill: 0   Follow up:

## 2022-12-26 NOTE — Progress Notes (Signed)
@Patient  ID: Crystal [name], female    DOB: 19-Jun-1961, 62 y.o.   MRN: [id number]  Chief Complaint  Patient presents with   Follow-up    Referring provider: Fenton Foy, NP   HPI  Crystal [name] presents for follow up. She  has a past medical history of Allergy, Diabetes mellitus, GERD (gastroesophageal reflux disease), Hyperlipidemia, Hypertension, Hyperthyroidism, and Recurrent upper respiratory infection (URI).     [name] is in today for diabetes and hypertension follow up. Patient is non complaint with hypertension medications. The prescribed treatment is Glimepiride 4 mg daily, januvia 100 mg daily, metformin 1,000 mg BID, januvia along with statin therapy simvastatin. Has been out of diabetic medications. There are no reported side effects from the treatment. Home glucose monitoring indicates a CBG range consistent with past. She denies any chest pain, chest tightness, shortness of breath, neck pain, nausea or vomiting.  Denies fever, chills, headache, dizziness, visual changes, polydipsia,  polyphagia , abdominal pain, polyuria, constipation, diarrhea, edema, numbness, burning of hand or feet.   Patient does complain of dry scalp and itching to her ears and scalp with hair loss.  We will refer her to dermatology.  We will try shampoo.   Allergies  Allergen Reactions   Aspirin Nausea And Vomiting   Tramadol     Upset stomach     Immunization History  Administered Date(s) Administered   Influenza,inj,Quad PF,6+ Mos 07/15/2017   Tdap 11/13/2016    Past Medical History:  Diagnosis Date   Allergy    Diabetes mellitus    GERD (gastroesophageal reflux disease)    Hyperlipidemia    Hypertension    Hyperthyroidism    Recurrent upper respiratory infection (URI)     Tobacco History: Social History   Tobacco Use  Smoking Status Never  Smokeless Tobacco Never   Counseling given: Not Answered   Outpatient Encounter Medications as of 12/26/2022   Medication Sig   acetaminophen (TYLENOL) 500 MG tablet Take 1,000 mg by mouth daily as needed for moderate pain.    amLODipine (NORVASC) 10 MG tablet Take 1 tablet (10 mg total) by mouth daily.   blood glucose meter kit and supplies One Touch Verio. Use up to four times daily as directed. (FOR ICD-10 E10.9, E11.9).   cetirizine (ZYRTEC ALLERGY) 10 MG tablet Take 1 tablet (10 mg total) by mouth at bedtime.   Ciclopirox 1 % shampoo Apply to affected area at bedtime.   fluticasone (FLONASE) 50 MCG/ACT nasal spray Place 1 spray into both nostrils 2 (two) times daily as needed (nasal congestion).   glimepiride (AMARYL) 4 MG tablet Take 2 tablets (8 mg total) by mouth daily before breakfast.   JANUVIA 100 MG tablet Take 1 tablet (100 mg total) by mouth daily.   Lancets (ONETOUCH ULTRASOFT) lancets Use as instructed   metFORMIN (GLUCOPHAGE) 500 MG tablet Take 2 tablets (1,000 mg total) by mouth 2 (two) times daily with a meal.   methimazole (TAPAZOLE) 10 MG tablet Take 1 tablet (10 mg total) by mouth daily.   simvastatin (ZOCOR) 20 MG tablet Take 1 tablet (20 mg total) by mouth every evening.   atenolol (TENORMIN) 25 MG tablet Take 1 tablet (25 mg total) by mouth daily. (Patient not taking: Reported on 12/26/2022)   methocarbamol (ROBAXIN) 500 MG tablet Take 1 tablet by mouth 2 times daily. (Patient not taking: Reported on 12/26/2022)   pantoprazole (PROTONIX) 40 MG tablet Take 1 tablet (40 mg total) by mouth daily. (Patient  not taking: Reported on 12/26/2022)   Facility-Administered Encounter Medications as of 12/26/2022  Medication   MEDERMA gel     Review of Systems  Review of Systems  Constitutional: Negative.   HENT: Negative.    Cardiovascular: Negative.   Gastrointestinal: Negative.   Skin:        Itchy dry scalp  Allergic/Immunologic: Negative.   Neurological: Negative.   Psychiatric/Behavioral: Negative.         Physical Exam  BP (!) 145/54   Pulse 97   Temp 97.7 F (36.5  C)   Ht 5\' 5"  (1.651 m)   Wt 161 lb 3.2 oz (73.1 kg)   LMP 06/02/2013   SpO2 100%   BMI 26.83 kg/m   Wt Readings from Last 5 Encounters:  12/26/22 161 lb 3.2 oz (73.1 kg)  09/26/22 162 lb (73.5 kg)  06/27/22 159 lb 2 oz (72.2 kg)  04/19/22 162 lb (73.5 kg)  03/08/22 164 lb (74.4 kg)     Physical Exam Vitals and nursing note reviewed.  Constitutional:      General: She is not in acute distress.    Appearance: She is well-developed.  Cardiovascular:     Rate and Rhythm: Normal rate and regular rhythm.  Pulmonary:     Effort: Pulmonary effort is normal.     Breath sounds: Normal breath sounds.  Neurological:     Mental Status: She is alert and oriented to person, place, and time.      Lab Results:  CBC    Component Value Date/Time   WBC 9.1 06/27/2022 1601   WBC 7.9 12/15/2019 1558   RBC 5.53 (H) 06/27/2022 1601   RBC 5.36 (H) 12/15/2019 1558   HGB 12.5 06/27/2022 1601   HCT 39.9 06/27/2022 1601   PLT 389 06/27/2022 1601   MCV 72 (L) 06/27/2022 1601   MCH 22.6 (L) 06/27/2022 1601   MCH 23.9 (L) 11/08/2016 1500   MCHC 31.3 (L) 06/27/2022 1601   MCHC 32.0 12/15/2019 1558   RDW 13.7 06/27/2022 1601   LYMPHSABS 2.7 09/26/2021 1547   MONOABS 0.5 12/15/2019 1558   EOSABS 0.1 09/26/2021 1547   BASOSABS 0.0 09/26/2021 1547    BMET    Component Value Date/Time   NA 141 06/27/2022 1601   K 4.0 06/27/2022 1601   CL 100 06/27/2022 1601   CO2 20 06/27/2022 1601   GLUCOSE 171 (H) 06/27/2022 1601   GLUCOSE 329 (H) 12/15/2019 1558   BUN 11 06/27/2022 1601   CREATININE 0.36 (L) 06/27/2022 1601   CREATININE 0.40 (L) 08/14/2017 1542   CALCIUM 9.5 06/27/2022 1601   GFRNONAA 113 08/11/2020 0950   GFRNONAA 117 08/14/2017 1542   GFRAA 131 08/11/2020 0950   GFRAA 136 08/14/2017 1542    BNP No results found for: "BNP"  ProBNP No results found for: "PROBNP"  Imaging: No results found.   Assessment & Plan:   Type 2 diabetes mellitus with hyperglycemia,  without long-term current use of insulin (HCC) - CBC - Comprehensive metabolic panel - Lipid Panel - AMB Referral to Pharmacy Medication Management  2. Lipid screening  - Lipid Panel  3. Dry scalp  - Ambulatory referral to Dermatology - Ciclopirox 1 % shampoo; Apply 1 Application topically at bedtime.  Dispense: 120 mL; Refill: 0  4. Hair loss  - Ambulatory referral to Dermatology - Ciclopirox 1 % shampoo; Apply 1 Application topically at bedtime.  Dispense: 120 mL; Refill: 0   Follow up:  Fenton Foy, NP 12/26/2022

## 2022-12-27 ENCOUNTER — Other Ambulatory Visit: Payer: Self-pay

## 2022-12-27 ENCOUNTER — Other Ambulatory Visit (HOSPITAL_COMMUNITY): Payer: Self-pay

## 2022-12-27 ENCOUNTER — Telehealth: Payer: Self-pay

## 2022-12-27 LAB — LIPID PANEL
Chol/HDL Ratio: 4.9 ratio — ABNORMAL HIGH (ref 0.0–4.4)
Cholesterol, Total: 232 mg/dL — ABNORMAL HIGH (ref 100–199)
HDL: 47 mg/dL (ref >39)
LDL Chol Calc (NIH): 158 mg/dL — ABNORMAL HIGH (ref 0–99)
Triglycerides: 148 mg/dL (ref 0–149)
VLDL Cholesterol Cal: 27 mg/dL (ref 5–40)

## 2022-12-27 LAB — CBC
Hematocrit: 40.4 % (ref 34.0–46.6)
Hemoglobin: 12.5 g/dL (ref 11.1–15.9)
MCH: 23.1 pg — ABNORMAL LOW (ref 26.6–33.0)
MCHC: 30.9 g/dL — ABNORMAL LOW (ref 31.5–35.7)
MCV: 75 fL — ABNORMAL LOW (ref 79–97)
Platelets: 386 10*3/uL (ref 150–450)
RBC: 5.4 x10E6/uL — ABNORMAL HIGH (ref 3.77–5.28)
RDW: 13.9 % (ref 11.7–15.4)
WBC: 8.3 10*3/uL (ref 3.4–10.8)

## 2022-12-27 LAB — COMPREHENSIVE METABOLIC PANEL
ALT: 41 IU/L — ABNORMAL HIGH (ref 0–32)
AST: 23 IU/L (ref 0–40)
Albumin/Globulin Ratio: 1.4 (ref 1.2–2.2)
Albumin: 4 g/dL (ref 3.8–4.9)
Alkaline Phosphatase: 128 IU/L — ABNORMAL HIGH (ref 44–121)
BUN/Creatinine Ratio: 20 (ref 12–28)
BUN: 9 mg/dL (ref 8–27)
Bilirubin Total: 0.3 mg/dL (ref 0.0–1.2)
CO2: 23 mmol/L (ref 20–29)
Calcium: 9.4 mg/dL (ref 8.7–10.3)
Chloride: 103 mmol/L (ref 96–106)
Creatinine, Ser: 0.46 mg/dL — ABNORMAL LOW (ref 0.57–1.00)
Globulin, Total: 2.9 g/dL (ref 1.5–4.5)
Glucose: 150 mg/dL — ABNORMAL HIGH (ref 70–99)
Potassium: 4.2 mmol/L (ref 3.5–5.2)
Sodium: 141 mmol/L (ref 134–144)
Total Protein: 6.9 g/dL (ref 6.0–8.5)
eGFR: 109 mL/min/{1.73_m2} (ref >59)

## 2022-12-27 LAB — VITAMIN D 25 HYDROXY (VIT D DEFICIENCY, FRACTURES): Vit D, 25-Hydroxy: 32 ng/mL (ref 30.0–100.0)

## 2022-12-27 NOTE — Progress Notes (Unsigned)
   Care Guide Note  12/27/2022 Name: Crystal [name] [id number] MRN: [id number] DOB: 13-Aug-1961  Referred by: Fenton Foy, NP Reason for referral : Care Coordination (Outreach to schedule with pharm d )   Crystal [name] is a 62 y.o. year old female who is a primary care patient of Fenton Foy, NP. Crystal [name] was referred to the pharmacist for assistance related to DM.    An unsuccessful telephone outreach was attempted today to contact the patient who was referred to the pharmacy team for assistance with medication management. Additional attempts will be made to contact the patient.   Crystal [name], [place], [place] [address] Direct Dial: [phone] [name]

## 2022-12-30 ENCOUNTER — Other Ambulatory Visit: Payer: Self-pay

## 2022-12-30 ENCOUNTER — Other Ambulatory Visit (HOSPITAL_COMMUNITY): Payer: Self-pay

## 2022-12-31 NOTE — Progress Notes (Unsigned)
   Care Guide Note  12/31/2022 Name: Crystal [name] [id number] MRN: [id number] DOB: 1961-07-22  Referred by: Fenton Foy, NP Reason for referral : Care Coordination (Outreach to schedule with pharm d )   [name] is a 62 y.o. year old female who is a primary care patient of Fenton Foy, NP. Roxanne Deckelman Younker was referred to the pharmacist for assistance related to DM.    A second unsuccessful telephone outreach was attempted today to contact the patient who was referred to the pharmacy team for assistance with medication management. Additional attempts will be made to contact the patient.  Noreene Larsson, [place], [place] [address] Direct Dial: [phone] [email or web address]

## 2023-01-02 ENCOUNTER — Telehealth: Payer: Self-pay | Admitting: Nurse Practitioner

## 2023-01-02 NOTE — Progress Notes (Signed)
   Care Guide Note  01/02/2023 Name: Crystal [name] [id number] MRN: [id number] DOB: [birth year]  Referred by: Fenton Foy, NP Reason for referral : Care Coordination (Outreach to schedule with pharm d )   Crystal [name] is a 62 y.o. year old female who is a primary care patient of Fenton Foy, NP. Crystal [name] was referred to the pharmacist for assistance related to DM.    A third unsuccessful telephone outreach was attempted today to contact the patient who was referred to the pharmacy team for assistance with medication management. The Population Health team is pleased to engage with this patient at any time in the future upon receipt of referral and should he/she be interested in assistance from the [facility] team.   Crystal [name], [place], [place] [address] Direct Dial: [phone] [name]

## 2023-01-02 NOTE — Telephone Encounter (Signed)
error 

## 2023-01-03 ENCOUNTER — Other Ambulatory Visit (HOSPITAL_COMMUNITY): Payer: Self-pay

## 2023-01-06 ENCOUNTER — Other Ambulatory Visit: Payer: Self-pay

## 2023-01-06 ENCOUNTER — Ambulatory Visit (INDEPENDENT_AMBULATORY_CARE_PROVIDER_SITE_OTHER): Payer: BLUE CROSS/BLUE SHIELD | Admitting: Orthopedic Surgery

## 2023-01-06 ENCOUNTER — Encounter: Payer: Self-pay | Admitting: Orthopedic Surgery

## 2023-01-06 VITALS — BP 137/65 | HR 104 | Ht 65.0 in | Wt 162.0 lb

## 2023-01-06 DIAGNOSIS — M542 Cervicalgia: Secondary | ICD-10-CM

## 2023-01-06 DIAGNOSIS — M25512 Pain in left shoulder: Secondary | ICD-10-CM | POA: Diagnosis not present

## 2023-01-06 NOTE — Progress Notes (Signed)
Orthopedic Spine Surgery Office Note  Assessment: Patient is a 62 y.o. female with history of C6/7 ACDF with Dr. Louanne Skye in 10/2016 who comes in for left shoulder pain.  Exam seems consistent with rotator cuff tear   Plan: -Explained that initially conservative treatment is tried as a significant number of patients may experience relief with these treatment modalities. Discussed that the conservative treatments include:  -activity modification  -physical therapy  -over the counter pain medications  -medrol dosepak  -cervical steroid injections -Patient has tried Tylenol, activity modification -Recommended physical therapy.  Referral provided to her today -Would need an A1c of 7.5 or less before any elective spine surgery is offered as a treatment option -If she is not doing any better at her next visit, will recommend MRI of the left shoulder to evaluate for rotator cuff tear -Patient should return to office in 6-8 weeks, x-rays at next visit: None   Patient expressed understanding of the plan and all questions were answered to the patient's satisfaction.   ___________________________________________________________________________   History:  Patient is a 62 y.o. female who presents today for cervical spine.  Patient had an ACDF with Dr. Louanne Skye in 2018 and did well after surgery.  She states that she had a fall about 6 months ago and landed on her outstretched left arm.  Since that time, she has had pain in the left shoulder.  It does not radiate past the shoulder.  Pain is worse with overhead activity and at night.  She feels it over the lateral aspect of the shoulder.  No pain in the right upper extremity.  No paresthesias or numbness.     Weakness: Denies Difficulty with fine motor skills (e.g., buttoning shirts, handwriting): Denies Symptoms of imbalance: Denies Paresthesias and numbness: Denies Bowel or bladder incontinence: Denies Saddle anesthesia: Denies  Treatments tried:  Tylenol, activity modification  Review of systems: Denies fevers and chills, night sweats, unexplained weight loss, history of cancer.  She has pain that wakes her at night  Past medical history: Hypertension Diabetes (last A1c was 9.3 on 12/26/2022) GERD HLD Hyperthyroidism  Allergies: aspirin, tramadol  Past surgical history:  C6/7 ACDF Cesarean section Tubal ligation  Social history: Denies use of nicotine product (smoking, vaping, patches, smokeless) Alcohol use: denies Denies recreational drug use  Physical Exam:  General: no acute distress, appears stated age Neurologic: alert, answering questions appropriately, following commands Respiratory: unlabored breathing on room air, symmetric chest rise Psychiatric: appropriate affect, normal cadence to speech   MSK (spine):  -Strength exam      Left  Right Grip strength                5/5  5/5 Interosseus   5/5   5/5 Wrist extension  5/5  5/5 Wrist flexion   5/5  5/5 Elbow flexion   5/5  5/5 Deltoid    5/5  5/5  EHL    5/5  5/5 TA    5/5  5/5 GSC    5/5  5/5 Knee extension  5/5  5/5 Hip flexion   5/5  5/5  -Sensory exam    Sensation intact to light touch in L3-S1 nerve distributions of bilateral lower extremities  Sensation intact to light touch in C5-T1 nerve distributions of bilateral upper extremities  -Brachioradialis DTR: 2/4 on the left, 2/4 on the right -Biceps DTR: 2/4 on the left, 2/4 on the right -Achilles DTR: 2/4 on the left, 2/4 on the right -Patellar tendon DTR: 2/4  on the left, 2/4 on the right  -Spurling: Negative bilaterally -Hoffman sign: Negative bilaterally -Clonus: No beats bilaterally -Interosseous wasting: None seen -Grip and release test: Negative -Romberg: Negative -Gait: Normal  Left shoulder exam: Pain with Jobe test with slight weakness as well when compared to contralateral side, negative belly press, no weakness with external rotation with arm at side, pain with external  rotation past 90 degrees, no pain through remainder of range of motion, negative drop arm sign Right shoulder exam: No pain through range of motion, negative Jobe, no weakness with external rotation with arm at side, negative belly press  Tinel's at wrist: Negative bilaterally Phalen's at wrist: Negative bilaterally Durkan's: Negative bilaterally  Tinel's at elbow: Negative bilaterally  Imaging: XR of the cervical spine from 01/06/2023 was independently reviewed and interpreted, showing anterior osteophyte formation at C3/4, C4/5, C5/6.  Anterior instrumentation with interbody fusion at C6/7.  No lucency around the screws.  Interspinous motion between C6 and C7 is less than a millimeter between flexion and extension.  Interspinous motion at the superjacent level is 5 mm between flexion and extension.  No fracture or dislocation seen.  No evidence of instability.   Patient name: Crystal [name] [id number] Patient MRN: [id number] Date of visit: 01/06/23

## 2023-01-07 ENCOUNTER — Ambulatory Visit
Admission: RE | Admit: 2023-01-07 | Discharge: 2023-01-07 | Disposition: A | Discharge status: Home / Self Care | Payer: BLUE CROSS/BLUE SHIELD | Source: Ambulatory Visit | Attending: Nurse Practitioner | Admitting: Nurse Practitioner

## 2023-01-09 ENCOUNTER — Other Ambulatory Visit: Payer: Self-pay | Admitting: Nurse Practitioner

## 2023-01-09 DIAGNOSIS — R928 Other abnormal and inconclusive findings on diagnostic imaging of breast: Secondary | ICD-10-CM

## 2023-01-10 ENCOUNTER — Other Ambulatory Visit (HOSPITAL_COMMUNITY): Payer: Self-pay

## 2023-01-10 ENCOUNTER — Other Ambulatory Visit: Payer: Self-pay | Admitting: Nurse Practitioner

## 2023-01-10 DIAGNOSIS — I10 Essential (primary) hypertension: Secondary | ICD-10-CM

## 2023-01-10 MED ORDER — AMLODIPINE BESYLATE 10 MG PO TABS
10.0000 mg | ORAL_TABLET | Freq: Every day | ORAL | 0 refills | Status: DC
Start: 2023-01-10 — End: 2023-05-05
  Filled 2023-01-10: qty 90, 90d supply, fill #0

## 2023-01-23 ENCOUNTER — Ambulatory Visit
Admission: RE | Admit: 2023-01-23 | Discharge: 2023-01-23 | Disposition: A | Discharge status: Home / Self Care | Payer: BLUE CROSS/BLUE SHIELD | Source: Ambulatory Visit | Attending: Nurse Practitioner | Admitting: Nurse Practitioner

## 2023-01-23 DIAGNOSIS — R928 Other abnormal and inconclusive findings on diagnostic imaging of breast: Secondary | ICD-10-CM

## 2023-01-28 ENCOUNTER — Telehealth: Payer: Self-pay | Admitting: Orthopedic Surgery

## 2023-01-28 NOTE — Telephone Encounter (Signed)
Patient states she has not received her referral for P/T. Please advise.

## 2023-01-28 NOTE — Telephone Encounter (Signed)
Tried calling to advise.  

## 2023-01-28 NOTE — Addendum Note (Signed)
Addended by: Willia Craze on: 01/28/2023 03:25 PM   Modules accepted: Orders

## 2023-01-29 ENCOUNTER — Other Ambulatory Visit (HOSPITAL_COMMUNITY): Payer: Self-pay

## 2023-02-11 ENCOUNTER — Other Ambulatory Visit (HOSPITAL_COMMUNITY): Payer: Self-pay

## 2023-02-24 ENCOUNTER — Other Ambulatory Visit (HOSPITAL_COMMUNITY): Payer: Self-pay

## 2023-02-24 ENCOUNTER — Ambulatory Visit (INDEPENDENT_AMBULATORY_CARE_PROVIDER_SITE_OTHER): Payer: BLUE CROSS/BLUE SHIELD | Admitting: Orthopedic Surgery

## 2023-02-24 DIAGNOSIS — M545 Low back pain, unspecified: Secondary | ICD-10-CM

## 2023-02-24 MED ORDER — DICLOFENAC SODIUM 50 MG PO TBEC
50.0000 mg | DELAYED_RELEASE_TABLET | Freq: Two times a day (BID) | ORAL | 0 refills | Status: AC
  Filled 2023-02-24: qty 60, 30d supply, fill #0

## 2023-02-24 MED ORDER — LIDOCAINE 5 % EX PTCH
1.0000 | MEDICATED_PATCH | CUTANEOUS | 0 refills | Status: AC
  Filled 2023-02-24 (×2): qty 30, 30d supply, fill #0

## 2023-02-24 NOTE — Progress Notes (Signed)
Orthopedic Spine Surgery Office Note  Assessment: Patient is a 62 y.o. female with 2 issues:  1) left shoulder pain that is improved with topical treatment 2) low back pain with no radicular symptoms   Plan: -Explained that initially conservative treatment is tried as a significant number of patients may experience relief with these treatment modalities. Discussed that the conservative treatments include:  -activity modification  -physical therapy  -tylenol, NSAIDs  -medrol dosepak  -lumbar steroid injections -Patient has tried activity modification, topical cream.  Recommended she continue with the topical since that seems to be helping -Prescribe diclofenac and lidocaine patches since this has worked in the past for her -Patient should return to office on an as needed basis; x-rays at next visit: lumbar AP/lateral/flex/ex   Patient expressed understanding of the plan and all questions were answered to the patient's satisfaction.   __________________________________________________________________________  History: Patient is a 62 y.o. female who has been previously seen in the office for left shoulder pain.  Since last time I saw her, her left shoulder pain has improved significantly.  She has been using topical cream which helps.  She does not have any pain rating past the shoulder.  Has not noticed any weakness in the shoulder.  Denies paresthesia numbness.  She also wants to talk about her lower back.  She describes a feeling of tightness in her lower back.  She does not have pain radiating into either lower extremity or buttock.  Pain is felt on a daily basis.  There is no trauma or injury that preceded the onset of pain.  Pain is worse with activity and does improve with rest.  She has had pain like this in the past and it improved with diclofenac and lidocaine patch.  Denies paresthesias and numbness in the lower extremities.  Previous treatments: activity modification, topical  cream  Physical Exam:  General: no acute distress, appears stated age Neurologic: alert, answering questions appropriately, following commands Respiratory: unlabored breathing on room air, symmetric chest rise Psychiatric: appropriate affect, normal cadence to speech   MSK (spine):  -Strength exam      Left  Right  EHL    5/5  5/5 TA    5/5  5/5 GSC    5/5  5/5 Knee extension  5/5  5/5 Hip flexion   5/5  5/5  -Sensory exam    Sensation intact to light touch in L3-S1 nerve distributions of bilateral lower extremities  -Achilles DTR: 2/4 on the left, 2/4 on the right -Patellar tendon DTR: 2/4 on the left, 2/4 on the right  Left shoulder exam: Pain with Jobe test and slight weakness when compared to contralateral side, no weakness with external rotation with arm at side, no pain through range of motion, negative belly press, negative Hawkins  Left hip exam: No pain through range of motion, negative Stinchfield, negative FABER Right hip exam: No pain to range of motion, negative Stinchfield, negative FABER   Imaging: XR of the cervical spine from 01/06/2023 was previously independently reviewed and interpreted, showing anterior osteophyte formation at C3/4, C4/5, C5/6.  Anterior instrumentation with interbody fusion at C6/7.  No lucency around the screws.  Interspinous motion between C6 and C7 is less than a millimeter between flexion and extension.  Interspinous motion at the superjacent level is 5 mm between flexion and extension.  No fracture or dislocation seen.  No evidence of instability.   XR of the lumbar spine from 07/05/2021 was independently reviewed and interpreted, showing  disc height loss at L3/4 and L4/5.  No other significant degenerative changes.  No fracture or dislocation seen.    Patient name: Crystal [name] [id number] Patient MRN: [id number] Date of visit: 02/24/23

## 2023-02-26 ENCOUNTER — Other Ambulatory Visit (HOSPITAL_COMMUNITY): Payer: Self-pay

## 2023-03-27 ENCOUNTER — Ambulatory Visit: Payer: Self-pay | Admitting: Nurse Practitioner

## 2023-05-02 NOTE — Telephone Encounter (Signed)
Caller & Relationship to patient:  MRN #  [id number]   Call Back Number:   Date of Last Office Visit: 03/27/2023     Date of Next Office Visit: 05/08/2023    Medication(s) to be Refilled: All med's for Diabetics and BP  Preferred Pharmacy:   ** Please notify patient to allow 48-72 hours to process** **Let patient know to contact pharmacy at the end of the day to make sure medication is ready. ** **If patient has not been seen in a year or longer, book an appointment **Advise to use MyChart for refill requests OR to contact their pharmacy

## 2023-05-05 ENCOUNTER — Other Ambulatory Visit (HOSPITAL_COMMUNITY): Payer: Self-pay

## 2023-05-05 ENCOUNTER — Other Ambulatory Visit: Payer: Self-pay

## 2023-05-05 DIAGNOSIS — E785 Hyperlipidemia, unspecified: Secondary | ICD-10-CM

## 2023-05-05 DIAGNOSIS — I10 Essential (primary) hypertension: Secondary | ICD-10-CM

## 2023-05-05 DIAGNOSIS — E1165 Type 2 diabetes mellitus with hyperglycemia: Secondary | ICD-10-CM

## 2023-05-05 MED ORDER — GLIMEPIRIDE 4 MG PO TABS
8.0000 mg | ORAL_TABLET | Freq: Every day | ORAL | 0 refills | Status: DC
Start: 2023-05-05 — End: 2023-07-02
  Filled 2023-05-05: qty 180, 90d supply, fill #0

## 2023-05-05 MED ORDER — JANUVIA 100 MG PO TABS
100.0000 mg | ORAL_TABLET | Freq: Every day | ORAL | 0 refills | Status: DC
Start: 2023-05-05 — End: 2023-07-02
  Filled 2023-05-05: qty 90, 90d supply, fill #0

## 2023-05-05 MED ORDER — AMLODIPINE BESYLATE 10 MG PO TABS
10.0000 mg | ORAL_TABLET | Freq: Every day | ORAL | 0 refills | Status: DC
Start: 2023-05-05 — End: 2023-07-02
  Filled 2023-05-05: qty 90, 90d supply, fill #0

## 2023-05-05 MED ORDER — METFORMIN HCL 500 MG PO TABS
1000.0000 mg | ORAL_TABLET | Freq: Two times a day (BID) | ORAL | 0 refills | Status: DC
Start: 2023-05-05 — End: 2023-07-02
  Filled 2023-05-05: qty 360, 90d supply, fill #0

## 2023-05-05 MED ORDER — SIMVASTATIN 20 MG PO TABS
20.0000 mg | ORAL_TABLET | Freq: Every evening | ORAL | 0 refills | Status: DC
Start: 2023-05-05 — End: 2023-07-02
  Filled 2023-05-05: qty 90, 90d supply, fill #0

## 2023-05-05 NOTE — Telephone Encounter (Signed)
Done KH 

## 2023-05-06 ENCOUNTER — Other Ambulatory Visit (HOSPITAL_COMMUNITY): Payer: Self-pay

## 2023-05-08 ENCOUNTER — Ambulatory Visit: Payer: Self-pay | Admitting: Nurse Practitioner

## 2023-05-22 ENCOUNTER — Encounter (HOSPITAL_COMMUNITY): Payer: Self-pay

## 2023-05-22 ENCOUNTER — Ambulatory Visit: Payer: Self-pay | Admitting: Nurse Practitioner

## 2023-06-24 ENCOUNTER — Encounter: Payer: Self-pay | Admitting: Gastroenterology

## 2023-07-02 ENCOUNTER — Ambulatory Visit (INDEPENDENT_AMBULATORY_CARE_PROVIDER_SITE_OTHER): Payer: BLUE CROSS/BLUE SHIELD | Admitting: Nurse Practitioner

## 2023-07-02 ENCOUNTER — Ambulatory Visit: Payer: Self-pay | Admitting: Nurse Practitioner

## 2023-07-02 ENCOUNTER — Other Ambulatory Visit (HOSPITAL_COMMUNITY): Payer: Self-pay

## 2023-07-02 ENCOUNTER — Encounter: Payer: Self-pay | Admitting: Nurse Practitioner

## 2023-07-02 VITALS — BP 137/55 | HR 101 | Temp 98.6°F | Resp 14 | Ht 65.0 in | Wt 162.0 lb

## 2023-07-02 DIAGNOSIS — K219 Gastro-esophageal reflux disease without esophagitis: Secondary | ICD-10-CM | POA: Diagnosis not present

## 2023-07-02 DIAGNOSIS — E1165 Type 2 diabetes mellitus with hyperglycemia: Secondary | ICD-10-CM | POA: Diagnosis not present

## 2023-07-02 DIAGNOSIS — I10 Essential (primary) hypertension: Secondary | ICD-10-CM

## 2023-07-02 DIAGNOSIS — E785 Hyperlipidemia, unspecified: Secondary | ICD-10-CM | POA: Diagnosis not present

## 2023-07-02 DIAGNOSIS — Z1329 Encounter for screening for other suspected endocrine disorder: Secondary | ICD-10-CM

## 2023-07-02 LAB — POCT GLYCOSYLATED HEMOGLOBIN (HGB A1C): HbA1c, POC (controlled diabetic range): 8.2 % — AB (ref 0.0–7.0)

## 2023-07-02 MED ORDER — ATENOLOL 25 MG PO TABS
25.0000 mg | ORAL_TABLET | Freq: Every day | ORAL | 2 refills | Status: DC
Start: 2023-07-02 — End: 2023-07-02
  Filled 2023-07-02: qty 30, 30d supply, fill #0

## 2023-07-02 MED ORDER — SIMVASTATIN 20 MG PO TABS
20.0000 mg | ORAL_TABLET | Freq: Every evening | ORAL | 0 refills | Status: DC
Start: 2023-07-02 — End: 2023-10-14
  Filled 2023-07-02: qty 90, 90d supply, fill #0

## 2023-07-02 MED ORDER — METHIMAZOLE 10 MG PO TABS
10.0000 mg | ORAL_TABLET | Freq: Every day | ORAL | 3 refills | Status: DC
  Filled 2023-07-02 – 2023-08-08 (×3): qty 90, 90d supply, fill #0

## 2023-07-02 MED ORDER — METFORMIN HCL 500 MG PO TABS
1000.0000 mg | ORAL_TABLET | Freq: Two times a day (BID) | ORAL | 0 refills | Status: DC
Start: 2023-07-02 — End: 2023-10-14
  Filled 2023-07-02 – 2023-08-08 (×3): qty 360, 90d supply, fill #0

## 2023-07-02 MED ORDER — JANUVIA 100 MG PO TABS
100.0000 mg | ORAL_TABLET | Freq: Every day | ORAL | 0 refills | Status: DC
Start: 2023-07-02 — End: 2023-10-14
  Filled 2023-07-02 – 2023-08-08 (×3): qty 90, 90d supply, fill #0

## 2023-07-02 MED ORDER — CETIRIZINE HCL 10 MG PO TABS
10.0000 mg | ORAL_TABLET | Freq: Every day | ORAL | 3 refills | Status: DC
  Filled 2023-07-02: qty 30, 30d supply, fill #0

## 2023-07-02 MED ORDER — GLIMEPIRIDE 4 MG PO TABS
8.0000 mg | ORAL_TABLET | Freq: Every day | ORAL | 0 refills | Status: DC
Start: 2023-07-02 — End: 2023-10-14
  Filled 2023-07-02 – 2023-08-08 (×3): qty 180, 90d supply, fill #0

## 2023-07-02 MED ORDER — AMLODIPINE BESYLATE 10 MG PO TABS
10.0000 mg | ORAL_TABLET | Freq: Every day | ORAL | 0 refills | Status: DC
Start: 2023-07-02 — End: 2023-10-14
  Filled 2023-07-02 – 2023-08-08 (×3): qty 90, 90d supply, fill #0

## 2023-07-02 MED ORDER — PANTOPRAZOLE SODIUM 40 MG PO TBEC
40.0000 mg | DELAYED_RELEASE_TABLET | Freq: Every day | ORAL | 3 refills | Status: DC
Start: 2023-07-02 — End: 2023-10-14
  Filled 2023-07-02: qty 90, 90d supply, fill #0

## 2023-07-02 NOTE — Patient Instructions (Signed)
1. Type 2 diabetes mellitus with hyperglycemia, without long-term current use of insulin (HCC)  - POCT glycosylated hemoglobin (Hb A1C) - glimepiride (AMARYL) 4 MG tablet; Take 2 tablets (8 mg total) by mouth daily before breakfast.  Dispense: 180 tablet; Refill: 0 - JANUVIA 100 MG tablet; Take 1 tablet (100 mg total) by mouth daily.  Dispense: 90 tablet; Refill: 0 - metFORMIN (GLUCOPHAGE) 500 MG tablet; Take 2 tablets (1,000 mg total) by mouth 2 (two) times daily with a meal.  Dispense: 360 tablet; Refill: 0 - CBC - Comprehensive metabolic panel  2. Primary hypertension  - amLODipine (NORVASC) 10 MG tablet; Take 1 tablet (10 mg total) by mouth daily.  Dispense: 90 tablet; Refill: 0  3. Gastroesophageal reflux disease without esophagitis  - pantoprazole (PROTONIX) 40 MG tablet; Take 1 tablet (40 mg total) by mouth daily.  Dispense: 90 tablet; Refill: 3  4. Dyslipidemia  - simvastatin (ZOCOR) 20 MG tablet; Take 1 tablet (20 mg total) by mouth every evening.  Dispense: 90 tablet; Refill: 0  5. Thyroid disorder screen  - TSH  Follow up:  Follow up in 3 months

## 2023-07-02 NOTE — Progress Notes (Signed)
Subjective   Patient ID: Crystal [name], female    DOB: December 19, 1960, 62 y.o.   MRN: [id number]  Chief Complaint  Patient presents with   Follow-up    Referring provider: Ivonne Andrew, NP  Crystal [name] is a 62 y.o. female with Past Medical History: No date: Allergy No date: Diabetes mellitus No date: GERD (gastroesophageal reflux disease) No date: Hyperlipidemia No date: Hypertension No date: Hyperthyroidism No date: Recurrent upper respiratory infection (URI)  HPI: HPI  [name] is in today for diabetes and hypertension follow up. Patient is non complaint with hypertension medications. The prescribed treatment is Glimepiride 4 mg daily, januvia 100 mg daily, metformin 1,000 mg BID, januvia along with statin therapy simvastatin. Has been out of diabetic medications. There are no reported side effects from the treatment. Home glucose monitoring indicates a CBG range consistent with past. She denies any chest pain, chest tightness, shortness of breath, neck pain, nausea or vomiting. A1C in office today is 8.2.   Patient does need a recheck on her thyroid today.  She does need refills on all of her medications today.  Denies fever, chills, headache, dizziness, visual changes, polydipsia,  polyphagia , abdominal pain, polyuria, constipation, diarrhea, edema, numbness, burning of hand or feet.     Allergies  Allergen Reactions   Aspirin Nausea And Vomiting   Tramadol     Upset stomach     Immunization History  Administered Date(s) Administered   Influenza,inj,Quad PF,6+ Mos 07/15/2017   Tdap 11/13/2016    Tobacco History: Social History   Tobacco Use  Smoking Status Never  Smokeless Tobacco Never   Counseling given: Not Answered   Outpatient Encounter Medications as of 07/02/2023  Medication Sig   acetaminophen (TYLENOL) 500 MG tablet Take 1,000 mg by mouth daily as needed for moderate pain.    blood glucose meter kit and supplies One Touch  Verio. Use up to four times daily as directed. (FOR ICD-10 E10.9, E11.9).   Ciclopirox 1 % shampoo Apply to affected area at bedtime.   fluticasone (FLONASE) 50 MCG/ACT nasal spray Place 1 spray into both nostrils 2 (two) times daily as needed (nasal congestion).   Lancets (ONETOUCH ULTRASOFT) lancets Use as instructed   lidocaine (LIDODERM) 5 % Place 1 patch onto the skin daily. Remove & Discard patch within 12 hours or as directed by MD   methocarbamol (ROBAXIN) 500 MG tablet Take 1 tablet by mouth 2 times daily.   [DISCONTINUED] amLODipine (NORVASC) 10 MG tablet Take 1 tablet (10 mg total) by mouth daily.   [DISCONTINUED] cetirizine (ZYRTEC ALLERGY) 10 MG tablet Take 1 tablet (10 mg total) by mouth at bedtime.   [DISCONTINUED] glimepiride (AMARYL) 4 MG tablet Take 2 tablets (8 mg total) by mouth daily before breakfast.   [DISCONTINUED] JANUVIA 100 MG tablet Take 1 tablet (100 mg total) by mouth daily.   [DISCONTINUED] metFORMIN (GLUCOPHAGE) 500 MG tablet Take 2 tablets (1,000 mg total) by mouth 2 (two) times daily with a meal.   [DISCONTINUED] methimazole (TAPAZOLE) 10 MG tablet Take 1 tablet (10 mg total) by mouth daily.   [DISCONTINUED] pantoprazole (PROTONIX) 40 MG tablet Take 1 tablet (40 mg total) by mouth daily.   [DISCONTINUED] simvastatin (ZOCOR) 20 MG tablet Take 1 tablet (20 mg total) by mouth every evening.   amLODipine (NORVASC) 10 MG tablet Take 1 tablet (10 mg total) by mouth daily.   atenolol (TENORMIN) 25 MG tablet Take 1 tablet (25 mg total) by mouth  daily. (Patient not taking: Reported on 12/26/2022)   cetirizine (ZYRTEC ALLERGY) 10 MG tablet Take 1 tablet (10 mg total) by mouth at bedtime.   glimepiride (AMARYL) 4 MG tablet Take 2 tablets (8 mg total) by mouth daily before breakfast.   JANUVIA 100 MG tablet Take 1 tablet (100 mg total) by mouth daily.   metFORMIN (GLUCOPHAGE) 500 MG tablet Take 2 tablets (1,000 mg total) by mouth 2 (two) times daily with a meal.   methimazole  (TAPAZOLE) 10 MG tablet Take 1 tablet (10 mg total) by mouth daily.   pantoprazole (PROTONIX) 40 MG tablet Take 1 tablet (40 mg total) by mouth daily.   simvastatin (ZOCOR) 20 MG tablet Take 1 tablet (20 mg total) by mouth every evening.   Facility-Administered Encounter Medications as of 07/02/2023  Medication   MEDERMA gel    Review of Systems  Review of Systems  Constitutional: Negative.   HENT: Negative.    Cardiovascular: Negative.   Gastrointestinal: Negative.   Allergic/Immunologic: Negative.   Neurological: Negative.   Psychiatric/Behavioral: Negative.       Objective:   BP (!) 167/69 (BP Location: Left Arm, Patient Position: Sitting, Cuff Size: Normal)   Pulse (!) 101   Temp 98.6 F (37 C)   Resp 14   Ht 5\' 5"  (1.651 m)   Wt 162 lb (73.5 kg)   LMP 06/02/2013   SpO2 99%   BMI 26.96 kg/m   Wt Readings from Last 5 Encounters:  07/02/23 162 lb (73.5 kg)  01/06/23 162 lb (73.5 kg)  12/26/22 161 lb 3.2 oz (73.1 kg)  09/26/22 162 lb (73.5 kg)  06/27/22 159 lb 2 oz (72.2 kg)     Physical Exam Vitals and nursing note reviewed.  Constitutional:      General: She is not in acute distress.    Appearance: She is well-developed.  Cardiovascular:     Rate and Rhythm: Normal rate and regular rhythm.  Pulmonary:     Effort: Pulmonary effort is normal.     Breath sounds: Normal breath sounds.  Neurological:     Mental Status: She is alert and oriented to person, place, and time.       Assessment & Plan:   Type 2 diabetes mellitus with hyperglycemia, without long-term current use of insulin (HCC) -     POCT glycosylated hemoglobin (Hb A1C) -     Glimepiride; Take 2 tablets (8 mg total) by mouth daily before breakfast.  Dispense: 180 tablet; Refill: 0 -     Januvia; Take 1 tablet (100 mg total) by mouth daily.  Dispense: 90 tablet; Refill: 0 -     metFORMIN HCl; Take 2 tablets (1,000 mg total) by mouth 2 (two) times daily with a meal.  Dispense: 360 tablet;  Refill: 0 -     CBC -     Comprehensive metabolic panel  Primary hypertension -     amLODIPine Besylate; Take 1 tablet (10 mg total) by mouth daily.  Dispense: 90 tablet; Refill: 0  Gastroesophageal reflux disease without esophagitis -     Pantoprazole Sodium; Take 1 tablet (40 mg total) by mouth daily.  Dispense: 90 tablet; Refill: 3  Dyslipidemia -     Simvastatin; Take 1 tablet (20 mg total) by mouth every evening.  Dispense: 90 tablet; Refill: 0  Thyroid disorder screen -     TSH  Other orders -     Cetirizine HCl; Take 1 tablet (10 mg total) by mouth at  bedtime.  Dispense: 30 tablet; Refill: 3 -     methIMAzole; Take 1 tablet (10 mg total) by mouth daily.  Dispense: 90 tablet; Refill: 3     Return in about 3 months (around 10/01/2023).   Crystal Andrew, NP 07/02/2023

## 2023-07-03 ENCOUNTER — Other Ambulatory Visit (HOSPITAL_COMMUNITY): Payer: Self-pay

## 2023-07-03 ENCOUNTER — Other Ambulatory Visit: Payer: Self-pay | Admitting: Nurse Practitioner

## 2023-07-03 DIAGNOSIS — E0590 Thyrotoxicosis, unspecified without thyrotoxic crisis or storm: Secondary | ICD-10-CM

## 2023-07-03 LAB — CBC
Hematocrit: 36.5 % (ref 34.0–46.6)
Hemoglobin: 12.3 g/dL (ref 11.1–15.9)
MCH: 32.7 pg (ref 26.6–33.0)
MCHC: 33.7 g/dL (ref 31.5–35.7)
MCV: 97 fL (ref 79–97)
Platelets: 245 10*3/uL (ref 150–450)
RBC: 3.76 x10E6/uL — ABNORMAL LOW (ref 3.77–5.28)
RDW: 10.8 % — ABNORMAL LOW (ref 11.7–15.4)
WBC: 6.7 10*3/uL (ref 3.4–10.8)

## 2023-07-03 LAB — COMPREHENSIVE METABOLIC PANEL
ALT: 44 IU/L — ABNORMAL HIGH (ref 0–32)
AST: 25 IU/L (ref 0–40)
Albumin: 4.1 g/dL (ref 3.9–4.9)
Alkaline Phosphatase: 121 IU/L (ref 44–121)
BUN/Creatinine Ratio: 24 (ref 12–28)
BUN: 8 mg/dL (ref 8–27)
Bilirubin Total: 0.4 mg/dL (ref 0.0–1.2)
CO2: 23 mmol/L (ref 20–29)
Calcium: 9.6 mg/dL (ref 8.7–10.3)
Chloride: 101 mmol/L (ref 96–106)
Creatinine, Ser: 0.34 mg/dL — ABNORMAL LOW (ref 0.57–1.00)
Globulin, Total: 2.9 g/dL (ref 1.5–4.5)
Glucose: 146 mg/dL — ABNORMAL HIGH (ref 70–99)
Potassium: 4.2 mmol/L (ref 3.5–5.2)
Sodium: 140 mmol/L (ref 134–144)
Total Protein: 7 g/dL (ref 6.0–8.5)
eGFR: 117 mL/min/{1.73_m2} (ref >59)

## 2023-07-03 LAB — TSH: TSH: <0.005 u[IU]/mL — ABNORMAL LOW (ref 0.450–4.500)

## 2023-07-14 ENCOUNTER — Other Ambulatory Visit (HOSPITAL_COMMUNITY): Payer: Self-pay

## 2023-08-08 ENCOUNTER — Other Ambulatory Visit: Payer: Self-pay | Admitting: Nurse Practitioner

## 2023-08-08 ENCOUNTER — Other Ambulatory Visit (HOSPITAL_COMMUNITY): Payer: Self-pay

## 2023-08-08 DIAGNOSIS — L659 Nonscarring hair loss, unspecified: Secondary | ICD-10-CM

## 2023-08-08 DIAGNOSIS — R238 Other skin changes: Secondary | ICD-10-CM

## 2023-08-08 MED ORDER — CICLOPIROX 1 % EX SHAM
1.0000 | MEDICATED_SHAMPOO | Freq: Every day | CUTANEOUS | 0 refills | Status: DC
  Filled 2023-08-08: qty 120, 30d supply, fill #0

## 2023-08-15 ENCOUNTER — Ambulatory Visit (INDEPENDENT_AMBULATORY_CARE_PROVIDER_SITE_OTHER): Payer: Medicaid Other

## 2023-08-15 ENCOUNTER — Encounter (HOSPITAL_COMMUNITY): Payer: Self-pay

## 2023-08-15 ENCOUNTER — Ambulatory Visit (HOSPITAL_COMMUNITY)
Admission: EM | Admit: 2023-08-15 | Discharge: 2023-08-15 | Disposition: A | Discharge status: Home / Self Care | Payer: Medicaid Other | Attending: Family Medicine | Admitting: Family Medicine

## 2023-08-15 DIAGNOSIS — M25512 Pain in left shoulder: Secondary | ICD-10-CM

## 2023-08-15 DIAGNOSIS — M5459 Other low back pain: Secondary | ICD-10-CM

## 2023-08-15 DIAGNOSIS — M545 Low back pain, unspecified: Secondary | ICD-10-CM

## 2023-08-15 NOTE — Discharge Instructions (Signed)
By my review there are no broken bones on your shoulder x-ray or on your spine x-rays.  There is a lot of arthritis evident on your spine x-rays.  The radiologist will also read your x-ray, and if their interpretation differs significantly from mine, we will call you.  I think it is a good sign that Tylenol helps when you take it.  Please continue using that as needed.  You can also get lidocaine patches over-the-counter and use them as needed on the painful area.  Please follow-up with your primary care.  You may end up needing physical therapy to help you resolve this issue.  We do not order physical therapy or do such referrals here in the urgent care clinic.
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
## 2023-08-18 ENCOUNTER — Other Ambulatory Visit (HOSPITAL_COMMUNITY): Payer: Self-pay

## 2023-10-07 ENCOUNTER — Ambulatory Visit: Payer: BLUE CROSS/BLUE SHIELD | Admitting: Dermatology

## 2023-10-13 ENCOUNTER — Ambulatory Visit: Payer: Self-pay | Admitting: Nurse Practitioner

## 2023-10-14 ENCOUNTER — Other Ambulatory Visit (HOSPITAL_COMMUNITY): Payer: Self-pay

## 2023-10-14 ENCOUNTER — Ambulatory Visit (INDEPENDENT_AMBULATORY_CARE_PROVIDER_SITE_OTHER): Payer: Medicaid Other | Admitting: Nurse Practitioner

## 2023-10-14 ENCOUNTER — Encounter: Payer: Self-pay | Admitting: Nurse Practitioner

## 2023-10-14 VITALS — BP 135/67 | HR 105 | Temp 97.2°F | Wt 161.0 lb

## 2023-10-14 DIAGNOSIS — E1165 Type 2 diabetes mellitus with hyperglycemia: Secondary | ICD-10-CM

## 2023-10-14 DIAGNOSIS — I10 Essential (primary) hypertension: Secondary | ICD-10-CM | POA: Diagnosis not present

## 2023-10-14 DIAGNOSIS — K219 Gastro-esophageal reflux disease without esophagitis: Secondary | ICD-10-CM | POA: Diagnosis not present

## 2023-10-14 DIAGNOSIS — E0590 Thyrotoxicosis, unspecified without thyrotoxic crisis or storm: Secondary | ICD-10-CM | POA: Diagnosis not present

## 2023-10-14 DIAGNOSIS — E785 Hyperlipidemia, unspecified: Secondary | ICD-10-CM

## 2023-10-14 LAB — POCT GLYCOSYLATED HEMOGLOBIN (HGB A1C): Hemoglobin A1C: 8.4 % — AB (ref 4.0–5.6)

## 2023-10-14 MED ORDER — GLIMEPIRIDE 4 MG PO TABS
8.0000 mg | ORAL_TABLET | Freq: Every day | ORAL | 0 refills | Status: DC
Start: 2023-10-14 — End: 2024-02-04
  Filled 2023-10-14: qty 60, 30d supply, fill #0
  Filled 2023-12-18: qty 60, 30d supply, fill #1

## 2023-10-14 MED ORDER — METFORMIN HCL 500 MG PO TABS
1000.0000 mg | ORAL_TABLET | Freq: Two times a day (BID) | ORAL | 0 refills | Status: DC
Start: 2023-10-14 — End: 2024-02-04
  Filled 2023-10-14: qty 360, 90d supply, fill #0

## 2023-10-14 MED ORDER — METHIMAZOLE 10 MG PO TABS
10.0000 mg | ORAL_TABLET | Freq: Every day | ORAL | 3 refills | Status: DC
  Filled 2023-10-14: qty 90, 90d supply, fill #0

## 2023-10-14 MED ORDER — JANUVIA 100 MG PO TABS
100.0000 mg | ORAL_TABLET | Freq: Every day | ORAL | 0 refills | Status: DC
Start: 2023-10-14 — End: 2024-02-04
  Filled 2023-10-14: qty 90, 90d supply, fill #0

## 2023-10-14 MED ORDER — SIMVASTATIN 20 MG PO TABS
20.0000 mg | ORAL_TABLET | Freq: Every evening | ORAL | 0 refills | Status: DC
Start: 2023-10-14 — End: 2024-02-04
  Filled 2023-10-14: qty 90, 90d supply, fill #0

## 2023-10-14 MED ORDER — CETIRIZINE HCL 10 MG PO TABS
10.0000 mg | ORAL_TABLET | Freq: Every day | ORAL | 3 refills | Status: AC
  Filled 2023-10-14: qty 30, 30d supply, fill #0

## 2023-10-14 MED ORDER — FLUTICASONE PROPIONATE 50 MCG/ACT NA SUSP
1.0000 | Freq: Two times a day (BID) | NASAL | 5 refills | Status: AC | PRN
  Filled 2023-10-14: qty 16, 30d supply, fill #0
  Filled 2024-03-04: qty 16, 30d supply, fill #1

## 2023-10-14 MED ORDER — PANTOPRAZOLE SODIUM 40 MG PO TBEC
40.0000 mg | DELAYED_RELEASE_TABLET | Freq: Every day | ORAL | 3 refills | Status: AC
Start: 2023-10-14 — End: ?
  Filled 2023-10-14: qty 90, 90d supply, fill #0

## 2023-10-14 MED ORDER — AMLODIPINE BESYLATE 10 MG PO TABS
10.0000 mg | ORAL_TABLET | Freq: Every day | ORAL | 0 refills | Status: DC
Start: 2023-10-14 — End: 2024-02-12
  Filled 2023-10-14: qty 90, 90d supply, fill #0

## 2023-10-14 NOTE — Patient Instructions (Signed)
 1. Type 2 diabetes mellitus with hyperglycemia, without long-term current use of insulin  (HCC) (Primary)  - POCT glycosylated hemoglobin (Hb A1C) - Ambulatory referral to Endocrinology - glimepiride  (AMARYL ) 4 MG tablet; Take 2 tablets (8 mg total) by mouth daily before breakfast.  Dispense: 180 tablet; Refill: 0 - JANUVIA  100 MG tablet; Take 1 tablet (100 mg total) by mouth daily.  Dispense: 90 tablet; Refill: 0 - metFORMIN  (GLUCOPHAGE ) 500 MG tablet; Take 2 tablets (1,000 mg total) by mouth 2 (two) times daily with a meal.  Dispense: 360 tablet; Refill: 0 - CBC - Comprehensive metabolic panel  2. Hyperthyroidism  - Ambulatory referral to Endocrinology  3. Primary hypertension  - amLODipine  (NORVASC ) 10 MG tablet; Take 1 tablet (10 mg total) by mouth daily.  Dispense: 90 tablet; Refill: 0  4. Gastroesophageal reflux disease without esophagitis  - pantoprazole  (PROTONIX ) 40 MG tablet; Take 1 tablet (40 mg total) by mouth daily.  Dispense: 90 tablet; Refill: 3  5. Dyslipidemia  - simvastatin  (ZOCOR ) 20 MG tablet; Take 1 tablet (20 mg total) by mouth every evening.  Dispense: 90 tablet; Refill: 0 - Lipid Panel  Follow up:  Follow up in 3 months

## 2023-10-14 NOTE — Progress Notes (Signed)
 Subjective   Patient ID: Crystal [name], female    DOB: 05/30/1961, 63 y.o.   MRN: [id number]  Chief Complaint  Patient presents with   Diabetes    3 month follow up    Referring provider: Oley Bascom RAMAN, NP  Crystal [name] is a 63 y.o. female with Past Medical History: No date: Allergy No date: Diabetes mellitus No date: GERD (gastroesophageal reflux disease) No date: Hyperlipidemia No date: Hypertension No date: Hyperthyroidism No date: Recurrent upper respiratory infection (URI)  HPI  Crystal [name] is in today for diabetes and hypertension follow up. Patient is non complaint with hypertension medications. The prescribed treatment is Glimepiride  4 mg daily, januvia  100 mg daily, metformin  1,000 mg BID, januvia  along with statin therapy simvastatin . Has been out of diabetic medications. There are no reported side effects from the treatment. Home glucose monitoring indicates a CBG range consistent with past. Crystal [name] denies any chest pain, chest tightness, shortness of breath, neck pain, nausea or vomiting. A1C in office today is 8.4.    Patient does need a new referral to [place] [facility] for thyroid  and diabetes.    Denies fever, chills, headache, dizziness, visual changes, polydipsia,  polyphagia , abdominal pain, polyuria, constipation, diarrhea, edema, numbness, burning of hand or feet.    Allergies  Allergen Reactions   Aspirin Nausea And Vomiting   Tramadol      Upset stomach     Immunization History  Administered Date(s) Administered   Influenza,inj,Quad PF,6+ Mos 07/15/2017   Tdap 11/13/2016    Tobacco History: Social History   Tobacco Use  Smoking Status Never  Smokeless Tobacco Never   Counseling given: Not Answered   Outpatient Encounter Medications as of 10/14/2023  Medication Sig   acetaminophen  (TYLENOL ) 500 MG tablet Take 1,000 mg by mouth daily as needed for moderate pain.    blood glucose meter kit and supplies One Touch Verio. Use  up to four times daily as directed. (FOR ICD-10 E10.9, E11.9).   Lancets (ONETOUCH ULTRASOFT) lancets Use as instructed   [DISCONTINUED] amLODipine  (NORVASC ) 10 MG tablet Take 1 tablet (10 mg total) by mouth daily.   [DISCONTINUED] cetirizine  (ZYRTEC  ALLERGY) 10 MG tablet Take 1 tablet (10 mg total) by mouth at bedtime.   [DISCONTINUED] fluticasone  (FLONASE ) 50 MCG/ACT nasal spray Place 1 spray into [name] nostrils 2 (two) times daily as needed (nasal congestion).   [DISCONTINUED] JANUVIA  100 MG tablet Take 1 tablet (100 mg total) by mouth daily.   [DISCONTINUED] metFORMIN  (GLUCOPHAGE ) 500 MG tablet Take 2 tablets (1,000 mg total) by mouth 2 (two) times daily with a meal.   [DISCONTINUED] pantoprazole  (PROTONIX ) 40 MG tablet Take 1 tablet (40 mg total) by mouth daily.   amLODipine  (NORVASC ) 10 MG tablet Take 1 tablet (10 mg total) by mouth daily.   cetirizine  (ZYRTEC  ALLERGY) 10 MG tablet Take 1 tablet (10 mg total) by mouth at bedtime.   Ciclopirox  1 % shampoo Apply to affected area at bedtime. (Patient not taking: Reported on 10/14/2023)   fluticasone  (FLONASE ) 50 MCG/ACT nasal spray Place 1 spray into [name] nostrils 2 (two) times daily as needed (nasal congestion).   glimepiride  (AMARYL ) 4 MG tablet Take 2 tablets (8 mg total) by mouth daily before breakfast.   JANUVIA  100 MG tablet Take 1 tablet (100 mg total) by mouth daily.   lidocaine  (LIDODERM ) 5 % Place 1 patch onto the skin daily. Remove & Discard patch within 12 hours or as directed by MD (Patient not taking:  Reported on 10/14/2023)   metFORMIN  (GLUCOPHAGE ) 500 MG tablet Take 2 tablets (1,000 mg total) by mouth 2 (two) times daily with a meal.   methimazole  (TAPAZOLE ) 10 MG tablet Take 1 tablet (10 mg total) by mouth daily.   pantoprazole  (PROTONIX ) 40 MG tablet Take 1 tablet (40 mg total) by mouth daily.   simvastatin  (ZOCOR ) 20 MG tablet Take 1 tablet (20 mg total) by mouth every evening.   [DISCONTINUED] glimepiride  (AMARYL ) 4 MG tablet  Take 2 tablets (8 mg total) by mouth daily before breakfast. (Patient not taking: Reported on 10/14/2023)   [DISCONTINUED] methimazole  (TAPAZOLE ) 10 MG tablet Take 1 tablet (10 mg total) by mouth daily. (Patient not taking: Reported on 10/14/2023)   [DISCONTINUED] simvastatin  (ZOCOR ) 20 MG tablet Take 1 tablet (20 mg total) by mouth every evening. (Patient not taking: Reported on 10/14/2023)   Facility-Administered Encounter Medications as of 10/14/2023  Medication   MEDERMA gel    Review of Systems  Review of Systems  Constitutional: Negative.   HENT: Negative.    Cardiovascular: Negative.   Gastrointestinal: Negative.   Allergic/Immunologic: Negative.   Neurological: Negative.   Psychiatric/Behavioral: Negative.       Objective:   BP 135/67   Pulse (!) 105   Temp (!) 97.2 F (36.2 C)   Wt 161 lb (73 kg)   LMP 06/02/2013   SpO2 98%   BMI 26.79 kg/m   Wt Readings from Last 5 Encounters:  10/14/23 161 lb (73 kg)  07/02/23 162 lb (73.5 kg)  01/06/23 162 lb (73.5 kg)  12/26/22 161 lb 3.2 oz (73.1 kg)  09/26/22 162 lb (73.5 kg)     Physical Exam Vitals and nursing note reviewed.  Constitutional:      General: Crystal [name] is not in acute distress.    Appearance: Crystal [name] is well-developed.  Cardiovascular:     Rate and Rhythm: Normal rate and regular rhythm.  Pulmonary:     Effort: Pulmonary effort is normal.     Breath sounds: Normal breath sounds.  Neurological:     Mental Status: Crystal [name] is alert and oriented to person, place, and time.       Assessment & Plan:   Type 2 diabetes mellitus with hyperglycemia, without long-term current use of insulin  (HCC) -     POCT glycosylated hemoglobin (Hb A1C) -     Ambulatory referral to Endocrinology -     Glimepiride ; Take 2 tablets (8 mg total) by mouth daily before breakfast.  Dispense: 180 tablet; Refill: 0 -     Januvia ; Take 1 tablet (100 mg total) by mouth daily.  Dispense: 90 tablet; Refill: 0 -     metFORMIN  HCl; Take 2 tablets  (1,000 mg total) by mouth 2 (two) times daily with a meal.  Dispense: 360 tablet; Refill: 0 -     CBC -     Comprehensive metabolic panel  Hyperthyroidism -     Ambulatory referral to Endocrinology  Primary hypertension -     amLODIPine  Besylate; Take 1 tablet (10 mg total) by mouth daily.  Dispense: 90 tablet; Refill: 0  Gastroesophageal reflux disease without esophagitis -     Pantoprazole  Sodium; Take 1 tablet (40 mg total) by mouth daily.  Dispense: 90 tablet; Refill: 3  Dyslipidemia -     Simvastatin ; Take 1 tablet (20 mg total) by mouth every evening.  Dispense: 90 tablet; Refill: 0 -     Lipid panel  Other orders -  Cetirizine  HCl; Take 1 tablet (10 mg total) by mouth at bedtime.  Dispense: 30 tablet; Refill: 3 -     Fluticasone  Propionate; Place 1 spray into [name] nostrils 2 (two) times daily as needed (nasal congestion).  Dispense: 16 g; Refill: 5 -     methIMAzole ; Take 1 tablet (10 mg total) by mouth daily.  Dispense: 90 tablet; Refill: 3     Return in about 3 months (around 01/12/2024).   Bascom GORMAN Borer, NP 10/14/2023

## 2023-10-15 LAB — COMPREHENSIVE METABOLIC PANEL
ALT: 26 [IU]/L (ref 0–32)
AST: 19 [IU]/L (ref 0–40)
Albumin: 4 g/dL (ref 3.9–4.9)
Alkaline Phosphatase: 133 [IU]/L — ABNORMAL HIGH (ref 44–121)
BUN/Creatinine Ratio: 27 (ref 12–28)
BUN: 10 mg/dL (ref 8–27)
Bilirubin Total: 0.3 mg/dL (ref 0.0–1.2)
CO2: 23 mmol/L (ref 20–29)
Calcium: 9.7 mg/dL (ref 8.7–10.3)
Chloride: 102 mmol/L (ref 96–106)
Creatinine, Ser: 0.37 mg/dL — ABNORMAL LOW (ref 0.57–1.00)
Globulin, Total: 2.8 g/dL (ref 1.5–4.5)
Glucose: 151 mg/dL — ABNORMAL HIGH (ref 70–99)
Potassium: 4.4 mmol/L (ref 3.5–5.2)
Sodium: 141 mmol/L (ref 134–144)
Total Protein: 6.8 g/dL (ref 6.0–8.5)
eGFR: 115 mL/min/{1.73_m2} (ref >59)

## 2023-10-15 LAB — CBC
Hematocrit: 41.5 % (ref 34.0–46.6)
Hemoglobin: 12.5 g/dL (ref 11.1–15.9)
MCH: 22.9 pg — ABNORMAL LOW (ref 26.6–33.0)
MCHC: 30.1 g/dL — ABNORMAL LOW (ref 31.5–35.7)
MCV: 76 fL — ABNORMAL LOW (ref 79–97)
Platelets: 411 10*3/uL (ref 150–450)
RBC: 5.46 x10E6/uL — ABNORMAL HIGH (ref 3.77–5.28)
RDW: 13.6 % (ref 11.7–15.4)
WBC: 8.7 10*3/uL (ref 3.4–10.8)

## 2023-10-15 LAB — LIPID PANEL
Chol/HDL Ratio: 5.2 {ratio} — ABNORMAL HIGH (ref 0.0–4.4)
Cholesterol, Total: 251 mg/dL — ABNORMAL HIGH (ref 100–199)
HDL: 48 mg/dL (ref >39)
LDL Chol Calc (NIH): 172 mg/dL — ABNORMAL HIGH (ref 0–99)
Triglycerides: 170 mg/dL — ABNORMAL HIGH (ref 0–149)
VLDL Cholesterol Cal: 31 mg/dL (ref 5–40)

## 2023-10-20 ENCOUNTER — Telehealth: Payer: Self-pay | Admitting: Nurse Practitioner

## 2023-10-20 ENCOUNTER — Other Ambulatory Visit (HOSPITAL_COMMUNITY): Payer: Self-pay

## 2023-10-20 NOTE — Telephone Encounter (Signed)
 Copied from CRM [id number]. Topic: Clinical - Lab/Test Results >> Oct 20, 2023 10:15 AM Gildardo Pounds wrote: Reason for CRM: Patient wants to discuss labs results . Callback number [phone]

## 2023-10-22 ENCOUNTER — Telehealth: Payer: Self-pay | Admitting: Nurse Practitioner

## 2023-10-22 NOTE — Telephone Encounter (Signed)
 Copied from CRM [id number]. Topic: Clinical - Lab/Test Results >> Oct 22, 2023  3:33 PM Roseanne Cones wrote: Reason for CRM: Patient was calling back to speak with a nurse regarding her lab results. I attempted to connect with the Clinic Access line, however, I was placed on hold. The patient lost connection and I attempted to reach back out to her but she has no voicemail setup. Please see if you can reach out to her as she had question regarding the comments left on her labs.

## 2023-10-28 ENCOUNTER — Other Ambulatory Visit (HOSPITAL_COMMUNITY): Payer: Self-pay

## 2023-10-28 ENCOUNTER — Other Ambulatory Visit (HOSPITAL_BASED_OUTPATIENT_CLINIC_OR_DEPARTMENT_OTHER): Payer: Self-pay

## 2023-10-31 ENCOUNTER — Other Ambulatory Visit (HOSPITAL_COMMUNITY): Payer: Self-pay

## 2023-11-06 ENCOUNTER — Other Ambulatory Visit (HOSPITAL_COMMUNITY): Payer: Self-pay

## 2023-11-06 MED ORDER — FLUOCINONIDE 0.05 % EX OINT
TOPICAL_OINTMENT | CUTANEOUS | 3 refills | Status: AC
  Filled 2023-11-06: qty 60, 30d supply, fill #0
  Filled 2023-12-18: qty 60, 30d supply, fill #1

## 2023-11-06 MED ORDER — HYDROQUINONE 4 % EX CREA
TOPICAL_CREAM | CUTANEOUS | 2 refills | Status: AC
  Filled 2023-11-06: qty 28.35, 15d supply, fill #0
  Filled 2023-12-18: qty 28.35, 15d supply, fill #1

## 2023-11-07 ENCOUNTER — Other Ambulatory Visit (HOSPITAL_COMMUNITY): Payer: Self-pay

## 2023-11-07 ENCOUNTER — Other Ambulatory Visit: Payer: Self-pay

## 2023-12-08 ENCOUNTER — Other Ambulatory Visit: Payer: Self-pay

## 2023-12-08 DIAGNOSIS — E1165 Type 2 diabetes mellitus with hyperglycemia: Secondary | ICD-10-CM

## 2023-12-11 ENCOUNTER — Telehealth: Payer: Self-pay | Admitting: Nurse Practitioner

## 2023-12-11 NOTE — Telephone Encounter (Signed)
 Patient was identified as falling into the [place] Measure - Diabetes.   Patient was: Appointment scheduled with primary care provider in the next 30 days.

## 2023-12-15 ENCOUNTER — Other Ambulatory Visit: Payer: BLUE CROSS/BLUE SHIELD

## 2023-12-17 ENCOUNTER — Other Ambulatory Visit: Payer: Self-pay

## 2023-12-17 ENCOUNTER — Ambulatory Visit (INDEPENDENT_AMBULATORY_CARE_PROVIDER_SITE_OTHER): Payer: BLUE CROSS/BLUE SHIELD | Admitting: "Endocrinology

## 2023-12-17 ENCOUNTER — Encounter (HOSPITAL_COMMUNITY): Payer: Self-pay | Admitting: Emergency Medicine

## 2023-12-17 ENCOUNTER — Telehealth: Payer: Self-pay | Admitting: "Endocrinology

## 2023-12-17 ENCOUNTER — Other Ambulatory Visit (HOSPITAL_COMMUNITY): Payer: Self-pay

## 2023-12-17 ENCOUNTER — Encounter: Payer: Self-pay | Admitting: "Endocrinology

## 2023-12-17 ENCOUNTER — Ambulatory Visit (HOSPITAL_COMMUNITY): Admission: EM | Admit: 2023-12-17 | Discharge: 2023-12-17 | Disposition: A | Discharge status: Home / Self Care

## 2023-12-17 VITALS — BP 124/70 | HR 91 | Ht 65.0 in | Wt 160.6 lb

## 2023-12-17 DIAGNOSIS — R238 Other skin changes: Secondary | ICD-10-CM | POA: Diagnosis not present

## 2023-12-17 DIAGNOSIS — E7800 Pure hypercholesterolemia, unspecified: Secondary | ICD-10-CM | POA: Diagnosis not present

## 2023-12-17 DIAGNOSIS — E0590 Thyrotoxicosis, unspecified without thyrotoxic crisis or storm: Secondary | ICD-10-CM

## 2023-12-17 DIAGNOSIS — E1165 Type 2 diabetes mellitus with hyperglycemia: Secondary | ICD-10-CM | POA: Diagnosis not present

## 2023-12-17 DIAGNOSIS — E049 Nontoxic goiter, unspecified: Secondary | ICD-10-CM | POA: Diagnosis not present

## 2023-12-17 DIAGNOSIS — Z7984 Long term (current) use of oral hypoglycemic drugs: Secondary | ICD-10-CM

## 2023-12-17 DIAGNOSIS — E039 Hypothyroidism, unspecified: Secondary | ICD-10-CM | POA: Diagnosis not present

## 2023-12-17 DIAGNOSIS — L219 Seborrheic dermatitis, unspecified: Secondary | ICD-10-CM | POA: Diagnosis not present

## 2023-12-17 MED ORDER — EMPAGLIFLOZIN 25 MG PO TABS
25.0000 mg | ORAL_TABLET | Freq: Every day | ORAL | 0 refills | Status: DC
  Filled 2023-12-17: qty 90, 90d supply, fill #0

## 2023-12-17 MED ORDER — SALICYLIC ACID 6 % EX SHAM
1.0000 | MEDICATED_SHAMPOO | Freq: Every day | CUTANEOUS | 0 refills | Status: AC | PRN
Start: 2023-12-17 — End: ?
  Filled 2023-12-17: qty 160, fill #0
  Filled 2023-12-18: qty 160, 30d supply, fill #0

## 2023-12-17 MED ORDER — DOXYCYCLINE HYCLATE 100 MG PO CAPS
100.0000 mg | ORAL_CAPSULE | Freq: Two times a day (BID) | ORAL | 0 refills | Status: AC
  Filled 2023-12-17: qty 14, 7d supply, fill #0

## 2023-12-17 NOTE — Discharge Instructions (Addendum)
-  Use prescribed doxycycline 100 mg capsule twice daily for 7 days for possible scalp infection secondary to scalp irritation. -Use prescribed salicylic acid 6% shampoo once daily to scalp for treatment of scalp irritation and possible seborrheic dermatitis with hyperkeratosis -Continue to monitor symptoms for any changes severity if there is any escalation of symptoms follow-up with dermatology for repeat evaluation and further treatment.

## 2023-12-17 NOTE — Progress Notes (Signed)
 Outpatient Endocrinology Note [place], MD    Crystal [name] 12-17-1960 [id number]  Referring Provider: Ivonne Andrew, NP Primary Care Provider: Ivonne Andrew, NP Reason for consultation: Subjective   Assessment & Plan  Diagnoses and all orders for this visit:  Uncontrolled type 2 diabetes mellitus with hyperglycemia (HCC) -     Ambulatory referral to diabetic education  Long term (current) use of oral hypoglycemic drugs  Pure hypercholesterolemia  Goiter -     US THYROID; Future -     US THYROID  Hyperthyroidism  Other orders -     empagliflozin (JARDIANCE) 25 MG TABS tablet; Take 1 tablet (25 mg total) by mouth daily before breakfast. Take 1/2 tablet (12.5 mg) for 2 weeks and then 1 tablet (25 mg) thereafter.   History of hyperthyroidism, was prescribed methimazole 10 mg but patient did not started Patient currently reports 10 pound weight loss, hair fall and voice change Added thyroid labs to current blood work, will follow the results Discussed methimazole with patient, will inform if she needs to start it, patient understands and agreed  Prominent goiter, especially on the right side noted Patient reports that she was was previously recommended thyroidectomy but could not afford it Ordered thyroid ultrasound  Type 2 diabetes with hyperglycemia 12/2023 C-peptide positive, MA/CR negative Patient did not bring her meter but reports that her blood sugars running between 1 80-200, even when she is fasting Currently on metformin 500 mg 2 pills twice daily, Januvia 100 mg every day and glimepiride 4 mg 2 pills in the morning Discussed with patient the need to start insulin given current A1c of 9.6 but patient is absolutely against insulin Ordered diabetes education Discussed and started Jardiance, no known contraindications Patient wants to try lifestyle changes before she would consider insulin  12/2023 Hypercholesterolemia with LDL of  132 Patient is on simvastatin 20 mg daily with elevated LFTs in the past, currently normal Will increase the dose once patient is settled on diabetes medications    Return in about 3 months (around 03/18/2024).   I have reviewed current medications, nurse's notes, allergies, vital signs, past medical and surgical history, family medical history, and social history for this encounter. Counseled patient on symptoms, examination findings, lab findings, imaging results, treatment decisions and monitoring and prognosis. The patient understood the recommendations and agrees with the treatment plan. All questions regarding treatment plan were fully answered.  Crystal Liberty, MD  12/18/23   History of Present Illness HPI  Crystal [name] is a 63 y.o. year old female who presents for evaluation of diabetes type II and thyroid.  Reports 10 lbs weight loss in past 2 mo, hair fall and voice change  Denies heat intolerance/hyperdefecation/palpitations No dysphagia/dyspnea  Never been on methimazole   Diagnosed on DM2 around 2013 Denies MI/stroke, neuropathy, retinopathy, nephropathy Did not bring meter, checking only fasting Bg 180-200 On metformin 500 mg 2 pills bid Januvia 100 mg every day Glimepiride 4 mg 2 pills in morning   Physical Exam  BP 124/70   Pulse 91   Ht 5\' 5"  (1.651 m)   Wt 160 lb 9.6 oz (72.8 kg)   LMP 06/02/2013   SpO2 97%   BMI 26.73 kg/m    Constitutional: well developed, well nourished Head: normocephalic, atraumatic Eyes: sclera anicteric, no redness Neck: supple Lungs: normal respiratory effort Neurology: alert and oriented Skin: dry, no appreciable rashes Musculoskeletal: no appreciable defects Psychiatric: normal mood and affect  Current Medications Patient's Medications  New Prescriptions   EMPAGLIFLOZIN (JARDIANCE) 25 MG TABS TABLET    Take 1 tablet (25 mg total) by mouth daily before breakfast. Take 1/2 tablet (12.5 mg) for 2 weeks and then  1 tablet (25 mg) thereafter.  Previous Medications   ACETAMINOPHEN (TYLENOL) 500 MG TABLET    Take 1,000 mg by mouth daily as needed for moderate pain.    AMLODIPINE (NORVASC) 10 MG TABLET    Take 1 tablet (10 mg total) by mouth daily.   BLOOD GLUCOSE METER KIT AND SUPPLIES    One Touch Verio. Use up to four times daily as directed. (FOR ICD-10 E10.9, E11.9).   CETIRIZINE (ZYRTEC ALLERGY) 10 MG TABLET    Take 1 tablet (10 mg total) by mouth at bedtime.   CICLOPIROX 1 % SHAMPOO    Apply to affected area at bedtime.   FLUOCINONIDE OINTMENT (LIDEX) 0.05 %    Apply daily to scalp once daily for 2 weeks. Then for maintenance, use 2-3 times weekly.   FLUTICASONE (FLONASE) 50 MCG/ACT NASAL SPRAY    Place 1 spray into both nostrils 2 (two) times daily as needed (nasal congestion).   GLIMEPIRIDE (AMARYL) 4 MG TABLET    Take 2 tablets (8 mg total) by mouth daily before breakfast.   HYDROQUINONE 4 % CREAM    Apply a thin layer to dark circles around eyes nightly for 2-3 months. Then stop for 1-2 months. If discoloration recurs may restart cycle.   JANUVIA 100 MG TABLET    Take 1 tablet (100 mg total) by mouth daily.   LANCETS (ONETOUCH ULTRASOFT) LANCETS    Use as instructed   LIDOCAINE (LIDODERM) 5 %    Place 1 patch onto the skin daily. Remove & Discard patch within 12 hours or as directed by MD   METFORMIN (GLUCOPHAGE) 500 MG TABLET    Take 2 tablets (1,000 mg total) by mouth 2 (two) times daily with a meal.   METHIMAZOLE (TAPAZOLE) 10 MG TABLET    Take 1 tablet (10 mg total) by mouth daily.   PANTOPRAZOLE (PROTONIX) 40 MG TABLET    Take 1 tablet (40 mg total) by mouth daily.   SIMVASTATIN (ZOCOR) 20 MG TABLET    Take 1 tablet (20 mg total) by mouth every evening.  Modified Medications   No medications on file  Discontinued Medications   No medications on file    Allergies Allergies  Allergen Reactions   Aspirin Nausea And Vomiting   Tramadol     Upset stomach     Past Medical History Past  Medical History:  Diagnosis Date   Allergy    Diabetes mellitus    GERD (gastroesophageal reflux disease)    Hyperlipidemia    Hypertension    Hyperthyroidism    Recurrent upper respiratory infection (URI)     Past Surgical History Past Surgical History:  Procedure Laterality Date   ANTERIOR CERVICAL DECOMP/DISCECTOMY FUSION N/A 10/11/2016   Procedure: ANTERIOR CERVICAL DISCECTOMY FUSION C6-7 WITH EXCISION OF OSTEOPHYTE;  Surgeon: Kerrin Champagne, MD;  Location: [facility] OR;  Service: Orthopedics;  Laterality: N/A;   CESAREAN SECTION     TUBAL LIGATION      Family History family history is not on file.  Social History Social History   Socioeconomic History   Marital status: Married    Spouse name: Not on file   Number of children: Not on file   Years of education: Not on file  Highest education level: Not on file  Occupational History   Not on file  Tobacco Use   Smoking status: Never   Smokeless tobacco: Never  Vaping Use   Vaping status: Never Used  Substance and Sexual Activity   Alcohol use: No   Drug use: No   Sexual activity: Yes  Other Topics Concern   Not on file  Social History Narrative   Not on file   Social Drivers of Health   Financial Resource Strain: Not on file  Food Insecurity: Not on file  Transportation Needs: Not on file  Physical Activity: Not on file  Stress: Not on file  Social Connections: Not on file  Intimate Partner Violence: Not on file    Lab Results  Component Value Date   CHOL 208 (H) 12/15/2023   Lab Results  Component Value Date   HDL 55 12/15/2023   Lab Results  Component Value Date   LDLCALC 132 (H) 12/15/2023   Lab Results  Component Value Date   TRIG 106 12/15/2023   Lab Results  Component Value Date   CHOLHDL 3.8 12/15/2023   Lab Results  Component Value Date   CREATININE 0.31 (L) 12/15/2023   Lab Results  Component Value Date   GFR 204.34 12/15/2019      Component Value Date/Time   NA 139 12/15/2023  0923   NA 141 10/14/2023 1423   K 4.6 12/15/2023 0923   CL 102 12/15/2023 0923   CO2 26 12/15/2023 0923   GLUCOSE 234 (H) 12/15/2023 0923   BUN 12 12/15/2023 0923   BUN 10 10/14/2023 1423   CREATININE 0.31 (L) 12/15/2023 0923   CALCIUM 9.4 12/15/2023 0923   PROT 6.8 12/15/2023 0923   PROT 6.8 10/14/2023 1423   ALBUMIN 4.0 10/14/2023 1423   AST 15 12/15/2023 0923   ALT 28 12/15/2023 0923   ALKPHOS 133 (H) 10/14/2023 1423   BILITOT 0.5 12/15/2023 0923   BILITOT 0.3 10/14/2023 1423   GFRNONAA 113 08/11/2020 0950   GFRNONAA 117 08/14/2017 1542   GFRAA 131 08/11/2020 0950   GFRAA 136 08/14/2017 1542      Latest Ref Rng & Units 12/15/2023    9:23 AM 10/14/2023    2:23 PM 07/02/2023    2:21 PM  BMP  Glucose 65 - 99 mg/dL 295  284  132   BUN 7 - 25 mg/dL 12  10  8    Creatinine 0.50 - 1.05 mg/dL 4.40  1.02  7.25   BUN/Creat Ratio 6 - 22 (calc) 39  27  24   Sodium 135 - 146 mmol/L 139  141  140   Potassium 3.5 - 5.3 mmol/L 4.6  4.4  4.2   Chloride 98 - 110 mmol/L 102  102  101   CO2 20 - 32 mmol/L 26  23  23    Calcium 8.6 - 10.4 mg/dL 9.4  9.7  9.6        Component Value Date/Time   WBC 8.7 10/14/2023 1423   WBC 7.9 12/15/2019 1558   RBC 5.46 (H) 10/14/2023 1423   RBC 5.36 (H) 12/15/2019 1558   HGB 12.5 10/14/2023 1423   HCT 41.5 10/14/2023 1423   PLT 411 10/14/2023 1423   MCV 76 (L) 10/14/2023 1423   MCH 22.9 (L) 10/14/2023 1423   MCH 23.9 (L) 11/08/2016 1500   MCHC 30.1 (L) 10/14/2023 1423   MCHC 32.0 12/15/2019 1558   RDW 13.6 10/14/2023 1423   LYMPHSABS 2.7 09/26/2021  1547   MONOABS 0.5 12/15/2019 1558   EOSABS 0.1 09/26/2021 1547   BASOSABS 0.0 09/26/2021 1547   Lab Results  Component Value Date   TSH <0.01 (L) 12/15/2023   TSH <0.005 (L) 07/02/2023   TSH <0.005 (L) 06/27/2022   FREET4 2.4 (H) 12/15/2023   FREET4 1.79 (H) 05/04/2021   FREET4 1.78 (H) 05/12/2020         Parts of this note may have been dictated using voice recognition software. There may be  variances in spelling and vocabulary which are unintentional. Not all errors are proofread. Please notify the Thereasa Parkin if any discrepancies are noted or if the meaning of any statement is not clear.

## 2023-12-17 NOTE — Patient Instructions (Signed)

## 2023-12-17 NOTE — ED Triage Notes (Addendum)
 Reports scalp is itching for a month.  Denies using any medications.  Patient reports bumps on scalp, itching and hair is falling out over the past month.  Denies any new soap or shampoo

## 2023-12-17 NOTE — Telephone Encounter (Signed)
 Patient requested at check in that her need for an Arabic interpreter be removed. Patient advises no longer needs interpreter and that English is her preferred language

## 2023-12-17 NOTE — ED Provider Notes (Signed)
 [facility]  Note:  This document was prepared using Dragon voice recognition software and may include unintentional dictation errors.  MRN: [id number] DOB: 06-14-1961  Subjective:   Crystal [name] is a 63 y.o. female presenting for itching irritated scalp for over a month.  Patient reports that she has used several different dandruff shampoos and followed up with dermatology and was prescribed a shampoo but is not having any improvement of symptoms.  Patient reports hair loss over the past month.  Patient denies any change in hair care products, lotion, soaps.  Patient denies any pain but is concerned that there may be an infection in her scalp secondary to scratching.   Current Facility-Administered Medications:    MEDERMA gel, , Topical, Once, Kerrin Champagne, MD  Current Outpatient Medications:    doxycycline (VIBRAMYCIN) 100 MG capsule, Take 1 capsule (100 mg total) by mouth 2 (two) times daily for 7 days., Disp: 14 capsule, Rfl: 0   Salicylic Acid 6 % SHAM, Apply 1 Application topically daily as needed., Disp: 160 mL, Rfl: 0   acetaminophen (TYLENOL) 500 MG tablet, Take 1,000 mg by mouth daily as needed for moderate pain. , Disp: , Rfl:    amLODipine (NORVASC) 10 MG tablet, Take 1 tablet (10 mg total) by mouth daily., Disp: 90 tablet, Rfl: 0   blood glucose meter kit and supplies, One Touch Verio. Use up to four times daily as directed. (FOR ICD-10 E10.9, E11.9)., Disp: 1 each, Rfl: 0   cetirizine (ZYRTEC ALLERGY) 10 MG tablet, Take 1 tablet (10 mg total) by mouth at bedtime., Disp: 30 tablet, Rfl: 3   Ciclopirox 1 % shampoo, Apply to affected area at bedtime., Disp: 120 mL, Rfl: 0   empagliflozin (JARDIANCE) 25 MG TABS tablet, Take 1 tablet (25 mg total) by mouth daily before breakfast. Take 1/2 tablet (12.5 mg) for 2 weeks and then 1 tablet (25 mg) thereafter., Disp: 90 tablet, Rfl: 0   fluocinonide ointment (LIDEX) 0.05 %, Apply daily to scalp once daily for 2  weeks. Then for maintenance, use 2-3 times weekly., Disp: 60 g, Rfl: 3   fluticasone (FLONASE) 50 MCG/ACT nasal spray, Place 1 spray into both nostrils 2 (two) times daily as needed (nasal congestion)., Disp: 16 g, Rfl: 5   glimepiride (AMARYL) 4 MG tablet, Take 2 tablets (8 mg total) by mouth daily before breakfast., Disp: 180 tablet, Rfl: 0   hydroquinone 4 % cream, Apply a thin layer to dark circles around eyes nightly for 2-3 months. Then stop for 1-2 months. If discoloration recurs may restart cycle., Disp: 28.35 g, Rfl: 2   JANUVIA 100 MG tablet, Take 1 tablet (100 mg total) by mouth daily., Disp: 90 tablet, Rfl: 0   Lancets (ONETOUCH ULTRASOFT) lancets, Use as instructed, Disp: 100 each, Rfl: 12   lidocaine (LIDODERM) 5 %, Place 1 patch onto the skin daily. Remove & Discard patch within 12 hours or as directed by MD, Disp: 30 patch, Rfl: 0   metFORMIN (GLUCOPHAGE) 500 MG tablet, Take 2 tablets (1,000 mg total) by mouth 2 (two) times daily with a meal., Disp: 360 tablet, Rfl: 0   methimazole (TAPAZOLE) 10 MG tablet, Take 1 tablet (10 mg total) by mouth daily., Disp: 90 tablet, Rfl: 3   pantoprazole (PROTONIX) 40 MG tablet, Take 1 tablet (40 mg total) by mouth daily., Disp: 90 tablet, Rfl: 3   simvastatin (ZOCOR) 20 MG tablet, Take 1 tablet (20 mg total) by mouth every evening., Disp:  90 tablet, Rfl: 0   Allergies  Allergen Reactions   Aspirin Nausea And Vomiting   Tramadol     Upset stomach     Past Medical History:  Diagnosis Date   Allergy    Diabetes mellitus    GERD (gastroesophageal reflux disease)    Hyperlipidemia    Hypertension    Hyperthyroidism    Recurrent upper respiratory infection (URI)      Past Surgical History:  Procedure Laterality Date   ANTERIOR CERVICAL DECOMP/DISCECTOMY FUSION N/A 10/11/2016   Procedure: ANTERIOR CERVICAL DISCECTOMY FUSION C6-7 WITH EXCISION OF OSTEOPHYTE;  Surgeon: Kerrin Champagne, MD;  Location: [facility] OR;  Service: Orthopedics;  Laterality:  N/A;   CESAREAN SECTION     TUBAL LIGATION      Family History  Problem Relation Age of Onset   Colon cancer Neg Hx    Colon polyps Neg Hx    Esophageal cancer Neg Hx    Rectal cancer Neg Hx    Stomach cancer Neg Hx     Social History   Tobacco Use   Smoking status: Never   Smokeless tobacco: Never  Vaping Use   Vaping status: Never Used  Substance Use Topics   Alcohol use: No   Drug use: No    ROS Refer to HPI for ROS details.  Objective:   Vitals: BP (!) 157/78 (BP Location: Right Arm)   Pulse 92   Temp 98.7 F (37.1 C) (Oral)   Resp 18   LMP 06/02/2013   SpO2 96%   Physical Exam Vitals and nursing note reviewed.  Constitutional:      General: She is not in acute distress.    Appearance: Normal appearance. She is well-developed. She is not ill-appearing or toxic-appearing.  HENT:     Head: Normocephalic. Abrasion present. Hair is abnormal.  Cardiovascular:     Rate and Rhythm: Normal rate.  Pulmonary:     Effort: Pulmonary effort is normal. No respiratory distress.  Skin:    General: Skin is warm and dry.     Findings: Erythema and rash present.     Comments: Mild excoriation and erythema to the scalp secondary to scratching, no obvious lesions noted, no bleeding, no drainage.  Neurological:     General: No focal deficit present.     Mental Status: She is alert and oriented to person, place, and time.  Psychiatric:        Mood and Affect: Mood normal.     Procedures  No results found for this or any previous visit (from the past 24 hours).  Assessment and Plan :   PDMP not reviewed this encounter.  1. Seborrheic dermatitis of scalp   2. Scalp irritation    -Use prescribed doxycycline 100 mg capsule twice daily for 7 days for possible scalp infection secondary to scalp irritation. -Use prescribed salicylic acid 6% shampoo once daily to scalp for treatment of scalp irritation and possible seborrheic dermatitis with hyperkeratosis -Continue  to monitor symptoms for any changes severity if there is any escalation of symptoms follow-up with dermatology for repeat evaluation and further treatment.  Lucky Cowboy   [place], [place], [place] 12/17/23 1644

## 2023-12-18 ENCOUNTER — Other Ambulatory Visit: Payer: Self-pay | Admitting: "Endocrinology

## 2023-12-18 ENCOUNTER — Other Ambulatory Visit (HOSPITAL_COMMUNITY): Payer: Self-pay

## 2023-12-18 DIAGNOSIS — E0590 Thyrotoxicosis, unspecified without thyrotoxic crisis or storm: Secondary | ICD-10-CM

## 2023-12-19 ENCOUNTER — Other Ambulatory Visit (HOSPITAL_COMMUNITY): Payer: Self-pay

## 2023-12-22 ENCOUNTER — Other Ambulatory Visit: Payer: Self-pay | Admitting: "Endocrinology

## 2023-12-22 ENCOUNTER — Other Ambulatory Visit (HOSPITAL_COMMUNITY): Payer: Self-pay

## 2023-12-22 LAB — TEST AUTHORIZATION

## 2023-12-22 LAB — LIPID PANEL
Cholesterol: 208 mg/dL — ABNORMAL HIGH (ref <200)
HDL: 55 mg/dL (ref >=50)
LDL Cholesterol (Calc): 132 mg/dL — ABNORMAL HIGH
Non-HDL Cholesterol (Calc): 153 mg/dL — ABNORMAL HIGH (ref <130)
Total CHOL/HDL Ratio: 3.8 (calc) (ref <5.0)
Triglycerides: 106 mg/dL (ref <150)

## 2023-12-22 LAB — COMPREHENSIVE METABOLIC PANEL
AG Ratio: 1.4 (calc) (ref 1.0–2.5)
ALT: 28 U/L (ref 6–29)
AST: 15 U/L (ref 10–35)
Albumin: 4 g/dL (ref 3.6–5.1)
Alkaline phosphatase (APISO): 112 U/L (ref 37–153)
BUN/Creatinine Ratio: 39 (calc) — ABNORMAL HIGH (ref 6–22)
BUN: 12 mg/dL (ref 7–25)
CO2: 26 mmol/L (ref 20–32)
Calcium: 9.4 mg/dL (ref 8.6–10.4)
Chloride: 102 mmol/L (ref 98–110)
Creat: 0.31 mg/dL — ABNORMAL LOW (ref 0.50–1.05)
Globulin: 2.8 g/dL (ref 1.9–3.7)
Glucose, Bld: 234 mg/dL — ABNORMAL HIGH (ref 65–99)
Potassium: 4.6 mmol/L (ref 3.5–5.3)
Sodium: 139 mmol/L (ref 135–146)
Total Bilirubin: 0.5 mg/dL (ref 0.2–1.2)
Total Protein: 6.8 g/dL (ref 6.1–8.1)

## 2023-12-22 LAB — MICROALBUMIN / CREATININE URINE RATIO
Creatinine, Urine: 19 mg/dL — ABNORMAL LOW (ref 20–275)
Microalb Creat Ratio: 21 mg/g{creat} (ref <30)
Microalb, Ur: 0.4 mg/dL

## 2023-12-22 LAB — HEMOGLOBIN A1C
Hgb A1c MFr Bld: 9.6 %{Hb} — ABNORMAL HIGH (ref <5.7)
Mean Plasma Glucose: 229 mg/dL
eAG (mmol/L): 12.7 mmol/L

## 2023-12-22 LAB — T3, FREE: T3, Free: 6.8 pg/mL — ABNORMAL HIGH (ref 2.3–4.2)

## 2023-12-22 LAB — T4, FREE: Free T4: 2.4 ng/dL — ABNORMAL HIGH (ref 0.8–1.8)

## 2023-12-22 LAB — THYROID STIMULATING IMMUNOGLOBULIN: TSI: <89 %{baseline} (ref <140)

## 2023-12-22 LAB — TSH: TSH: <0.01 m[IU]/L — ABNORMAL LOW (ref 0.40–4.50)

## 2023-12-22 LAB — TRAB (TSH RECEPTOR BINDING ANTIBODY): TRAB: 2.33 IU/L — ABNORMAL HIGH (ref <=2.00)

## 2023-12-22 LAB — C-PEPTIDE: C-Peptide: 2.58 ng/mL (ref 0.80–3.85)

## 2023-12-22 MED ORDER — METHIMAZOLE 10 MG PO TABS
10.0000 mg | ORAL_TABLET | Freq: Every day | ORAL | 1 refills | Status: DC
  Filled 2023-12-22: qty 90, 90d supply, fill #0

## 2023-12-24 ENCOUNTER — Other Ambulatory Visit (HOSPITAL_COMMUNITY): Payer: Self-pay

## 2023-12-24 ENCOUNTER — Ambulatory Visit (HOSPITAL_COMMUNITY)
Admission: RE | Admit: 2023-12-24 | Discharge: 2023-12-24 | Disposition: A | Discharge status: Home / Self Care | Source: Ambulatory Visit | Attending: "Endocrinology | Admitting: "Endocrinology

## 2023-12-24 DIAGNOSIS — E049 Nontoxic goiter, unspecified: Secondary | ICD-10-CM | POA: Diagnosis present

## 2024-01-06 ENCOUNTER — Other Ambulatory Visit (HOSPITAL_COMMUNITY): Payer: Self-pay

## 2024-01-06 ENCOUNTER — Telehealth: Payer: Self-pay

## 2024-01-06 NOTE — Telephone Encounter (Signed)
 Pharmacy Patient Advocate Encounter   Received notification from CoverMyMeds that prior authorization for Jardiance is required/requested.   Insurance verification completed.   The patient is insured through [employer] [facility] .   Per test claim: PA required; PA submitted to above mentioned insurance via CoverMyMeds Key/confirmation #/EOC [id number] Status is pending

## 2024-01-08 ENCOUNTER — Other Ambulatory Visit (HOSPITAL_COMMUNITY): Payer: Self-pay

## 2024-01-12 ENCOUNTER — Ambulatory Visit: Payer: Self-pay | Admitting: Nurse Practitioner

## 2024-01-19 ENCOUNTER — Other Ambulatory Visit (HOSPITAL_COMMUNITY): Payer: Self-pay

## 2024-01-23 ENCOUNTER — Ambulatory Visit: Payer: Self-pay | Admitting: Nurse Practitioner

## 2024-01-23 NOTE — Telephone Encounter (Signed)
 Summary: Feet are hot Advice   Copied From CRM [phone]. Reason for Triage: Patient is calling to report that her feet her hot. And her blood sugar is  200. The office is closed today.        Chief Complaint: High blood sugar Symptoms: hot feet; BG 200 Frequency: constant Pertinent Negatives: Patient denies frequent thirst Disposition: [] ED /[] Urgent Care (no appt availability in office) / [] Appointment(In office/virtual)/ []  [facility]/ [] Home Care/ [] Refused Recommended Disposition /[] [facility]/ [x]  Follow-up with PCP Additional Notes: Pt reports getting over the flu and has notice high blood sugar in the morning for over a week. Denies missing any medication doses. Home care advice given, OV set for 4/21  Reason for Disposition  Blood glucose 70-240 mg/dL (3.9 -86.6 mmol/L)  Answer Assessment - Initial Assessment Questions 1. BLOOD GLUCOSE: What is your blood glucose level?      200   2. ONSET: When did you check the blood glucose?     Taken this morning  3. USUAL RANGE: What is your glucose level usually? (e.g., usual fasting morning value, usual evening value)     86  4. KETONES: Do you check for ketones (urine or blood test strips)? If Yes, ask: What does the test show now?      No  5. TYPE 1 or 2:  Do you know what type of diabetes you have?  (e.g., Type 1, Type 2, Gestational; doesn't know)      Type 2 DM  6. INSULIN : Do you take insulin ? What type of insulin (s) do you use? What is the mode of delivery? (syringe, pen; injection or pump)?      No  7. DIABETES PILLS: Do you take any pills for your diabetes? If Yes, ask: Have you missed taking any pills recently?     Yes, metformin , januva, glimeride  8. OTHER SYMPTOMS: Do you have any symptoms? (e.g., fever, frequent urination, difficulty breathing, dizziness, weakness, vomiting)     Feet feel hot at night and morning  9. PREGNANCY: Is there any chance you are  pregnant? When was your last menstrual period?     No  Protocols used: Diabetes - High Blood Sugar-A-AH

## 2024-01-26 ENCOUNTER — Ambulatory Visit (INDEPENDENT_AMBULATORY_CARE_PROVIDER_SITE_OTHER): Payer: Self-pay | Admitting: Nurse Practitioner

## 2024-01-26 ENCOUNTER — Ambulatory Visit: Payer: Self-pay | Admitting: Nurse Practitioner

## 2024-01-26 ENCOUNTER — Encounter: Payer: Self-pay | Admitting: Nurse Practitioner

## 2024-01-26 VITALS — BP 133/57 | HR 102 | Temp 98.6°F | Wt 157.4 lb

## 2024-01-26 DIAGNOSIS — E1165 Type 2 diabetes mellitus with hyperglycemia: Secondary | ICD-10-CM | POA: Diagnosis not present

## 2024-01-26 NOTE — Progress Notes (Signed)
 Subjective   Patient ID: Crystal [name], female    DOB: [birth year], 62 y.o.   MRN: [id number]  Chief Complaint  Patient presents with   Medical Management of Chronic Issues    Hot feet    Referring provider: [name], NP  Nat Badger Nereyda [name] is a 63 y.o. female with Past Medical History: No date: Allergy No date: Diabetes mellitus No date: GERD (gastroesophageal reflux disease) No date: Hyperlipidemia No date: Hypertension No date: Hyperthyroidism No date: Recurrent upper respiratory infection (URI)    HPI  Patient presents today for diabetic issues.  She states her blood glucose has been elevated recently.  Her A1c has jumped up to 9.6.  We will place a referral to endocrinology and to pharmacy for medication management. Denies f/c/s, n/v/d, hemoptysis, PND, leg swelling Denies chest pain or edema     Allergies  Allergen Reactions   Aspirin Nausea And Vomiting   Tramadol      Upset stomach     Immunization History  Administered Date(s) Administered   Influenza,inj,Quad PF,6+ Mos 07/15/2017   Tdap 11/13/2016    Tobacco History: Social History   Tobacco Use  Smoking Status Never  Smokeless Tobacco Never   Counseling given: Not Answered   Outpatient Encounter Medications as of 01/26/2024  Medication Sig   acetaminophen  (TYLENOL ) 500 MG tablet Take 1,000 mg by mouth daily as needed for moderate pain.    amLODipine  (NORVASC ) 10 MG tablet Take 1 tablet (10 mg total) by mouth daily.   blood glucose meter kit and supplies One Touch Verio. Use up to four times daily as directed. (FOR ICD-10 E10.9, E11.9).   cetirizine  (ZYRTEC  ALLERGY) 10 MG tablet Take 1 tablet (10 mg total) by mouth at bedtime.   Ciclopirox  1 % shampoo Apply to affected area at bedtime.   fluocinonide  ointment (LIDEX ) 0.05 % Apply daily to scalp once daily for 2 weeks. Then for maintenance, use 2-3 times weekly.   fluticasone  (FLONASE ) 50 MCG/ACT nasal spray Place 1 spray into  both nostrils 2 (two) times daily as needed (nasal congestion).   hydroquinone  4 % cream Apply a thin layer to dark circles around eyes nightly for 2-3 months. Then stop for 1-2 months. If discoloration recurs may restart cycle.   Lancets (ONETOUCH ULTRASOFT) lancets Use as instructed   lidocaine  (LIDODERM ) 5 % Place 1 patch onto the skin daily. Remove & Discard patch within 12 hours or as directed by MD   pantoprazole  (PROTONIX ) 40 MG tablet Take 1 tablet (40 mg total) by mouth daily.   Salicylic Acid  6 % SHAM Apply 1 Application topically daily as needed.   [DISCONTINUED] empagliflozin  (JARDIANCE ) 25 MG TABS tablet Take 1/2 tablet (12.5 mg) for 2 weeks and then 1 tablet (25 mg) thereafter. (Patient not taking: Reported on 02/04/2024)   [DISCONTINUED] glimepiride  (AMARYL ) 4 MG tablet Take 2 tablets (8 mg total) by mouth daily before breakfast.   [DISCONTINUED] JANUVIA  100 MG tablet Take 1 tablet (100 mg total) by mouth daily.   [DISCONTINUED] metFORMIN  (GLUCOPHAGE ) 500 MG tablet Take 2 tablets (1,000 mg total) by mouth 2 (two) times daily with a meal.   [DISCONTINUED] methimazole  (TAPAZOLE ) 10 MG tablet Take 1 tablet (10 mg total) by mouth daily.   [DISCONTINUED] simvastatin  (ZOCOR ) 20 MG tablet Take 1 tablet (20 mg total) by mouth every evening.   Facility-Administered Encounter Medications as of 01/26/2024  Medication   MEDERMA gel    Review of Systems  Review of Systems  Constitutional: Negative.   HENT: Negative.    Cardiovascular: Negative.   Gastrointestinal: Negative.   Allergic/Immunologic: Negative.   Neurological: Negative.   Psychiatric/Behavioral: Negative.       Objective:   BP (!) 133/57   Pulse (!) 102   Temp 98.6 F (37 C) (Oral)   Wt 157 lb 6.4 oz (71.4 kg)   LMP 06/02/2013   SpO2 100%   BMI 26.19 kg/m   Wt Readings from Last 5 Encounters:  02/04/24 155 lb (70.3 kg)  01/26/24 157 lb 6.4 oz (71.4 kg)  12/17/23 160 lb 9.6 oz (72.8 kg)  10/14/23 161 lb (73  kg)  07/02/23 162 lb (73.5 kg)     Physical Exam Vitals and nursing note reviewed.  Constitutional:      General: She is not in acute distress.    Appearance: She is well-developed.  Cardiovascular:     Rate and Rhythm: Normal rate and regular rhythm.  Pulmonary:     Effort: Pulmonary effort is normal.     Breath sounds: Normal breath sounds.  Neurological:     Mental Status: She is alert and oriented to person, place, and time.       Assessment & Plan:   Type 2 diabetes mellitus with hyperglycemia, without long-term current use of insulin  (HCC) -     Ambulatory referral to Endocrinology -     AMB Referral [facility]     Return in about 6 months (around 07/27/2024).   [name], NP 02/04/2024

## 2024-01-26 NOTE — Patient Instructions (Signed)
 1. Type 2 diabetes mellitus with hyperglycemia, without long-term current use of insulin  (HCC) (Primary)  - Ambulatory referral to Endocrinology - AMB Referral [facility] Care Management

## 2024-01-29 ENCOUNTER — Telehealth: Payer: Self-pay | Admitting: *Deleted

## 2024-01-29 NOTE — Progress Notes (Signed)
 Care Guide Pharmacy Note  01/29/2024 Name: Essence Merle NWGNFA MRN: [id number] DOB: 1961/01/17  Referred By: [name], NP Reason for referral: Complex Care Management (Initial outreach to schedule referral with PharmD )   Ettie Hermanns Dorvil is a 63 y.o. year old female who is a primary care patient of [name], NP.  Annah Jasko Garrido was referred to the pharmacist for assistance related to: DMII  An unsuccessful telephone outreach was attempted today to contact the patient who was referred to the pharmacy team for assistance with medication management. Additional attempts will be made to contact the patient. [name]  [facility]  [facility], [facility] Guide  Direct Dial: [phone]  Fax [phone]  [facility]  [facility], [facility] Guide  Direct Dial: [phone]  Fax [phone]

## 2024-01-30 ENCOUNTER — Other Ambulatory Visit

## 2024-01-30 LAB — TSH: TSH: <0.01 m[IU]/L — ABNORMAL LOW (ref 0.40–4.50)

## 2024-01-30 LAB — T3, FREE: T3, Free: 8.6 pg/mL — ABNORMAL HIGH (ref 2.3–4.2)

## 2024-01-30 LAB — T4, FREE: Free T4: 2.7 ng/dL — ABNORMAL HIGH (ref 0.8–1.8)

## 2024-02-02 ENCOUNTER — Encounter: Payer: Self-pay | Admitting: "Endocrinology

## 2024-02-02 NOTE — Progress Notes (Unsigned)
 Care Guide Pharmacy Note  02/02/2024 Name: Crystal [name] ZOXWRU MRN: [id number] DOB: Apr 12, 1961  Referred By: [name], NP Reason for referral: Complex Care Management (Initial outreach to schedule referral with PharmD )   [name] is a 63 y.o. year old female who is a primary care patient of [name], NP.  [name] was referred to the pharmacist for assistance related to: DMII  A second unsuccessful telephone outreach was attempted today to contact the patient who was referred to the pharmacy team for assistance with medication management. Additional attempts will be made to contact the patient.  [name]  [facility]  [facility], [facility] Guide  Direct Dial: [phone]  Fax [phone]

## 2024-02-03 NOTE — Progress Notes (Signed)
 Care Guide Pharmacy Note  02/03/2024 Name: Kamaile Bissey LKGMWN MRN: [id number] DOB: 06-27-1961  Referred By: [name], NP Reason for referral: Complex Care Management (Initial outreach to schedule referral with PharmD )   Ettie Hermanns Bunte is a 64 y.o. year old female who is a primary care patient of [name], NP.  Davi Schutter Beem was referred to the pharmacist for assistance related to: DMII  A third unsuccessful telephone outreach was attempted today to contact the patient who was referred to the pharmacy team for assistance with medication management. The Population Health team is pleased to engage with this patient at any time in the future upon receipt of referral and should he/she be interested in assistance from the Population Health team.  [name]  [facility]  [facility], [facility] Guide  Direct Dial: [phone]  Fax [phone]

## 2024-02-04 ENCOUNTER — Encounter: Payer: Self-pay | Admitting: "Endocrinology

## 2024-02-04 ENCOUNTER — Ambulatory Visit (INDEPENDENT_AMBULATORY_CARE_PROVIDER_SITE_OTHER): Admitting: "Endocrinology

## 2024-02-04 ENCOUNTER — Other Ambulatory Visit: Payer: Self-pay

## 2024-02-04 VITALS — BP 130/70 | HR 60 | Ht 65.0 in | Wt 155.0 lb

## 2024-02-04 DIAGNOSIS — Z7984 Long term (current) use of oral hypoglycemic drugs: Secondary | ICD-10-CM | POA: Diagnosis not present

## 2024-02-04 DIAGNOSIS — E1165 Type 2 diabetes mellitus with hyperglycemia: Secondary | ICD-10-CM

## 2024-02-04 DIAGNOSIS — E7800 Pure hypercholesterolemia, unspecified: Secondary | ICD-10-CM

## 2024-02-04 DIAGNOSIS — E042 Nontoxic multinodular goiter: Secondary | ICD-10-CM | POA: Diagnosis not present

## 2024-02-04 DIAGNOSIS — E0500 Thyrotoxicosis with diffuse goiter without thyrotoxic crisis or storm: Secondary | ICD-10-CM

## 2024-02-04 MED ORDER — METHIMAZOLE 10 MG PO TABS
10.0000 mg | ORAL_TABLET | Freq: Three times a day (TID) | ORAL | 1 refills | Status: DC
  Filled 2024-02-04: qty 90, 30d supply, fill #0

## 2024-02-04 MED ORDER — JANUVIA 100 MG PO TABS
100.0000 mg | ORAL_TABLET | Freq: Every day | ORAL | 1 refills | Status: DC
  Filled 2024-02-04: qty 90, 90d supply, fill #0

## 2024-02-04 MED ORDER — SIMVASTATIN 20 MG PO TABS
20.0000 mg | ORAL_TABLET | Freq: Every evening | ORAL | 0 refills | Status: DC
  Filled 2024-02-04: qty 90, 90d supply, fill #0

## 2024-02-04 MED ORDER — EMPAGLIFLOZIN 25 MG PO TABS
25.0000 mg | ORAL_TABLET | Freq: Every day | ORAL | 1 refills | Status: DC
  Filled 2024-02-04: qty 83, 90d supply, fill #0

## 2024-02-04 MED ORDER — METFORMIN HCL 500 MG PO TABS
1000.0000 mg | ORAL_TABLET | Freq: Two times a day (BID) | ORAL | 1 refills | Status: DC
Start: 2024-02-04 — End: 2024-08-11
  Filled 2024-02-04: qty 360, 90d supply, fill #0
  Filled 2024-04-24: qty 360, 90d supply, fill #1

## 2024-02-04 MED ORDER — GLIMEPIRIDE 4 MG PO TABS
8.0000 mg | ORAL_TABLET | Freq: Every day | ORAL | 1 refills | Status: DC
  Filled 2024-02-04: qty 180, 90d supply, fill #0
  Filled 2024-06-15: qty 180, 90d supply, fill #1

## 2024-02-04 NOTE — Progress Notes (Signed)
 Outpatient Endocrinology Note Crystal Newcomer, MD    Wyonia Ewoldt Weltman [birth year] [id number]  Referring Provider: [name], NP Primary Care Provider: [name], NP Reason for consultation: Subjective   Assessment & Plan  Diagnoses and all orders for this visit:  Graves disease -     methimazole  (TAPAZOLE ) 10 MG tablet; Take 1 tablet (10 mg total) by mouth 3 (three) times daily. -     TSH -     T4, free -     T3, free  Multinodular goiter  Type 2 diabetes mellitus with hyperglycemia, without long-term current use of insulin  (HCC) -     glimepiride  (AMARYL ) 4 MG tablet; Take 2 tablets (8 mg total) by mouth daily before breakfast. -     JANUVIA  100 MG tablet; Take 1 tablet (100 mg total) by mouth daily. -     metFORMIN  (GLUCOPHAGE ) 500 MG tablet; Take 2 tablets (1,000 mg total) by mouth 2 (two) times daily with a meal. -     simvastatin  (ZOCOR ) 20 MG tablet; Take 1 tablet (20 mg total) by mouth every evening.  Long term (current) use of oral hypoglycemic drugs  Pure hypercholesterolemia  Other orders -     empagliflozin  (JARDIANCE ) 25 MG TABS tablet; Take 1/2 tablet (12.5 mg) for 2 weeks and then 1 tablet (25 mg) thereafter.   History of hyperthyroidism, was prescribed methimazole  10 mg but patient did not start 12/2023 TSI -ve, TRAb + Patient currently reports weight loss, hair fall and voice change Recommend methimazole  10 mg tid, discussed need for compliance at length  Discussed methimazole  previously with patient, patient understands and agreed  Prominent goiter, especially on the right side noted S/p L thyroidectomy in 1993   Patient reports that she was was previously recommended thyroidectomy but could not afford it 12/2023 thyroid  ultrasound: per report: Stable size of right superior and inferior thyroid  nodules. These nodules are stable since the prior sonogram 4 years ago. Both nodules have been previously biopsied with negative cytology.  Status post left thyroidectomy. Confirmed that patient had FNA in 2014 per out records on right sided nodules Continue to monitor clinically    Type 2 diabetes with hyperglycemia 12/2023 C-peptide positive, MA/CR negative Patient did not bring her meter but reports that her blood sugars running between 130-200 Currently on metformin  500 mg 2 pills twice daily, Januvia  100 mg every day and glimepiride  4 mg 2 pills in the morning Discussed with patient the need to start insulin  given current A1c of 9.6 but patient is absolutely against insulin  Ordered diabetes education Discussed and started Jardiance , no known contraindications: patient not taking it  Patient wants to try lifestyle changes before she would consider insulin   12/2023 Hypercholesterolemia with LDL of 132 Patient is on simvastatin  20 mg daily with elevated LFTs in the past, currently normal Will increase the dose once patient is settled on diabetes medications   Return in about 3 weeks (around 02/25/2024) for visit + labs before next visit.   I have reviewed current medications, nurse's notes, allergies, vital signs, past medical and surgical history, family medical history, and social history for this encounter. Counseled patient on symptoms, examination findings, lab findings, imaging results, treatment decisions and monitoring and prognosis. The patient understood the recommendations and agrees with the treatment plan. All questions regarding treatment plan were fully answered.  Crystal Newcomer, MD  02/04/24   History of Present Illness HPI  Kaleiyah Nida Hoffpauir is a 63 y.o. year  old female who presents for evaluation of diabetes type II and thyroid .  Reports weight loss, hair fall Sometimes have heat intolerance denies hyperdefecation/palpitations No dysphagia/dyspnea, sometimes has voice change   Never been on methimazole    Diagnosed on DM2 around 2013 Denies MI/stroke, neuropathy, retinopathy, nephropathy Did not  bring meter, checking only fasting Bg 132-200 On metformin  500 mg 2 pills bid Januvia  100 mg every day Glimepiride  4 mg 2 pills in morning Simvastatin  20 mg every day   Physical Exam  BP 130/70   Pulse 60   Ht 5\' 5"  (1.651 m)   Wt 155 lb (70.3 kg)   LMP 06/02/2013   SpO2 96%   BMI 25.79 kg/m    Constitutional: well developed, well nourished Head: normocephalic, atraumatic Eyes: sclera anicteric, no redness Neck: supple Lungs: normal respiratory effort Neurology: alert and oriented Skin: dry, no appreciable rashes Musculoskeletal: no appreciable defects Psychiatric: normal mood and affect   Current Medications Patient's Medications  New Prescriptions   No medications on file  Previous Medications   ACETAMINOPHEN  (TYLENOL ) 500 MG TABLET    Take 1,000 mg by mouth daily as needed for moderate pain.    AMLODIPINE  (NORVASC ) 10 MG TABLET    Take 1 tablet (10 mg total) by mouth daily.   BLOOD GLUCOSE METER KIT AND SUPPLIES    One Touch Verio. Use up to four times daily as directed. (FOR ICD-10 E10.9, E11.9).   CETIRIZINE  (ZYRTEC  ALLERGY) 10 MG TABLET    Take 1 tablet (10 mg total) by mouth at bedtime.   CICLOPIROX  1 % SHAMPOO    Apply to affected area at bedtime.   FLUOCINONIDE  OINTMENT (LIDEX ) 0.05 %    Apply daily to scalp once daily for 2 weeks. Then for maintenance, use 2-3 times weekly.   FLUTICASONE  (FLONASE ) 50 MCG/ACT NASAL SPRAY    Place 1 spray into both nostrils 2 (two) times daily as needed (nasal congestion).   HYDROQUINONE  4 % CREAM    Apply a thin layer to dark circles around eyes nightly for 2-3 months. Then stop for 1-2 months. If discoloration recurs may restart cycle.   LANCETS (ONETOUCH ULTRASOFT) LANCETS    Use as instructed   LIDOCAINE  (LIDODERM ) 5 %    Place 1 patch onto the skin daily. Remove & Discard patch within 12 hours or as directed by MD   PANTOPRAZOLE  (PROTONIX ) 40 MG TABLET    Take 1 tablet (40 mg total) by mouth daily.   SALICYLIC ACID  6 % SHAM     Apply 1 Application topically daily as needed.  Modified Medications   Modified Medication Previous Medication   EMPAGLIFLOZIN  (JARDIANCE ) 25 MG TABS TABLET empagliflozin  (JARDIANCE ) 25 MG TABS tablet      Take 1/2 tablet (12.5 mg) for 2 weeks and then 1 tablet (25 mg) thereafter.    Take 1/2 tablet (12.5 mg) for 2 weeks and then 1 tablet (25 mg) thereafter.   GLIMEPIRIDE  (AMARYL ) 4 MG TABLET glimepiride  (AMARYL ) 4 MG tablet      Take 2 tablets (8 mg total) by mouth daily before breakfast.    Take 2 tablets (8 mg total) by mouth daily before breakfast.   JANUVIA  100 MG TABLET JANUVIA  100 MG tablet      Take 1 tablet (100 mg total) by mouth daily.    Take 1 tablet (100 mg total) by mouth daily.   METFORMIN  (GLUCOPHAGE ) 500 MG TABLET metFORMIN  (GLUCOPHAGE ) 500 MG tablet      Take 2  tablets (1,000 mg total) by mouth 2 (two) times daily with a meal.    Take 2 tablets (1,000 mg total) by mouth 2 (two) times daily with a meal.   METHIMAZOLE  (TAPAZOLE ) 10 MG TABLET methimazole  (TAPAZOLE ) 10 MG tablet      Take 1 tablet (10 mg total) by mouth 3 (three) times daily.    Take 1 tablet (10 mg total) by mouth daily.   SIMVASTATIN  (ZOCOR ) 20 MG TABLET simvastatin  (ZOCOR ) 20 MG tablet      Take 1 tablet (20 mg total) by mouth every evening.    Take 1 tablet (20 mg total) by mouth every evening.  Discontinued Medications   No medications on file    Allergies Allergies  Allergen Reactions   Aspirin Nausea And Vomiting   Tramadol      Upset stomach     Past Medical History Past Medical History:  Diagnosis Date   Allergy    Diabetes mellitus    GERD (gastroesophageal reflux disease)    Hyperlipidemia    Hypertension    Hyperthyroidism    Recurrent upper respiratory infection (URI)     Past Surgical History Past Surgical History:  Procedure Laterality Date   ANTERIOR CERVICAL DECOMP/DISCECTOMY FUSION N/A 10/11/2016   Procedure: ANTERIOR CERVICAL DISCECTOMY FUSION C6-7 WITH EXCISION OF OSTEOPHYTE;   Surgeon: Alphonso Jean, MD;  Location: [facility] OR;  Service: Orthopedics;  Laterality: N/A;   CESAREAN SECTION     TUBAL LIGATION      Family History family history is not on file.  Social History Social History   Socioeconomic History   Marital status: Married    Spouse name: Not on file   Number of children: Not on file   Years of education: Not on file   Highest education level: Not on file  Occupational History   Not on file  Tobacco Use   Smoking status: Never   Smokeless tobacco: Never  Vaping Use   Vaping status: Never Used  Substance and Sexual Activity   Alcohol use: No   Drug use: No   Sexual activity: Yes  Other Topics Concern   Not on file  Social History Narrative   Not on file   Social Drivers of Health   Financial Resource Strain: Not on file  Food Insecurity: Not on file  Transportation Needs: Not on file  Physical Activity: Not on file  Stress: Not on file  Social Connections: Not on file  Intimate Partner Violence: Not on file    Lab Results  Component Value Date   CHOL 208 (H) 12/15/2023   Lab Results  Component Value Date   HDL 55 12/15/2023   Lab Results  Component Value Date   LDLCALC 132 (H) 12/15/2023   Lab Results  Component Value Date   TRIG 106 12/15/2023   Lab Results  Component Value Date   CHOLHDL 3.8 12/15/2023   Lab Results  Component Value Date   CREATININE 0.31 (L) 12/15/2023   Lab Results  Component Value Date   GFR 204.34 12/15/2019      Component Value Date/Time   NA 139 12/15/2023 0923   NA 141 10/14/2023 1423   K 4.6 12/15/2023 0923   CL 102 12/15/2023 0923   CO2 26 12/15/2023 0923   GLUCOSE 234 (H) 12/15/2023 0923   BUN 12 12/15/2023 0923   BUN 10 10/14/2023 1423   CREATININE 0.31 (L) 12/15/2023 0923   CALCIUM 9.4 12/15/2023 0923   PROT 6.8 12/15/2023  0923   PROT 6.8 10/14/2023 1423   ALBUMIN 4.0 10/14/2023 1423   AST 15 12/15/2023 0923   ALT 28 12/15/2023 0923   ALKPHOS 133 (H) 10/14/2023  1423   BILITOT 0.5 12/15/2023 0923   BILITOT 0.3 10/14/2023 1423   GFRNONAA 113 08/11/2020 0950   GFRNONAA 117 08/14/2017 1542   GFRAA 131 08/11/2020 0950   GFRAA 136 08/14/2017 1542      Latest Ref Rng & Units 12/15/2023    9:23 AM 10/14/2023    2:23 PM 07/02/2023    2:21 PM  BMP  Glucose 65 - 99 mg/dL 161  096  045   BUN 7 - 25 mg/dL 12  10  8    Creatinine 0.50 - 1.05 mg/dL 4.09  8.11  9.14   BUN/Creat Ratio 6 - 22 (calc) 39  27  24   Sodium 135 - 146 mmol/L 139  141  140   Potassium 3.5 - 5.3 mmol/L 4.6  4.4  4.2   Chloride 98 - 110 mmol/L 102  102  101   CO2 20 - 32 mmol/L 26  23  23    Calcium 8.6 - 10.4 mg/dL 9.4  9.7  9.6        Component Value Date/Time   WBC 8.7 10/14/2023 1423   WBC 7.9 12/15/2019 1558   RBC 5.46 (H) 10/14/2023 1423   RBC 5.36 (H) 12/15/2019 1558   HGB 12.5 10/14/2023 1423   HCT 41.5 10/14/2023 1423   PLT 411 10/14/2023 1423   MCV 76 (L) 10/14/2023 1423   MCH 22.9 (L) 10/14/2023 1423   MCH 23.9 (L) 11/08/2016 1500   MCHC 30.1 (L) 10/14/2023 1423   MCHC 32.0 12/15/2019 1558   RDW 13.6 10/14/2023 1423   LYMPHSABS 2.7 09/26/2021 1547   MONOABS 0.5 12/15/2019 1558   EOSABS 0.1 09/26/2021 1547   BASOSABS 0.0 09/26/2021 1547   Lab Results  Component Value Date   TSH <0.01 (L) 01/30/2024   TSH <0.01 (L) 12/15/2023   TSH <0.005 (L) 07/02/2023   FREET4 2.7 (H) 01/30/2024   FREET4 2.4 (H) 12/15/2023   FREET4 1.79 (H) 05/04/2021         Parts of this note may have been dictated using voice recognition software. There may be variances in spelling and vocabulary which are unintentional. Not all errors are proofread. Please notify the [facility] if any discrepancies are noted or if the meaning of any statement is not clear.

## 2024-02-06 ENCOUNTER — Encounter: Attending: Nurse Practitioner | Admitting: Dietician

## 2024-02-06 ENCOUNTER — Encounter: Payer: Self-pay | Admitting: Dietician

## 2024-02-06 ENCOUNTER — Other Ambulatory Visit: Payer: Self-pay

## 2024-02-06 VITALS — Ht 65.0 in | Wt 154.0 lb

## 2024-02-06 DIAGNOSIS — E1165 Type 2 diabetes mellitus with hyperglycemia: Secondary | ICD-10-CM | POA: Insufficient documentation

## 2024-02-06 NOTE — Patient Instructions (Signed)
 Plan:  Aim for 3-4 Carb Choices per meal (45-60 grams) +/- 1 either way  Aim for 0-1 Carbs per snack if hungry  Include protein in moderation with your meals and snacks Consider reading food labels for Total Carbohydrate of foods Consider  increasing your activity level by walking for 1 hour minutes daily as tolerated Continue checking BG at alternate times per day  Continue taking medication as directed by MD

## 2024-02-06 NOTE — Progress Notes (Signed)
 Diabetes Self-Management Education  Visit Type: First/Initial  Appt. Start Time: 1525 (late) Appt. End Time: 1625  02/06/2024  [name], identified by name and date of birth, is a 63 y.o. female with a diagnosis of Diabetes: Type 2.   ASSESSMENT Patient is here today alone. She wants to work on her lifestyle to improve her blood glucose and avoid insulin .  She has started the Jardiance  yesterday and noted that it is working to lower her blood glucose.  History includes: Type 2 diabetes (2015), HLD, HTN, Graves disease, GERD Medications:  Jardiance  (just started yesterday), glimepiride  (stopped when she started Jardiance ), Januvia , Metformin , Methimazole , simvastatin , pantoprazole  (prn) Labs noted to include: A1C 9.6% 12/15/2023 increased from 8.4% 10/14/2023, C-Peptide 2.58 12/15/2023, eGFR 115 1/7/2-25, vitamin D  32 12/2022, Vitamin B-12 in 2021  Lipid Panel     Component Value Date/Time   CHOL 208 (H) 12/15/2023 0923   CHOL 251 (H) 10/14/2023 1423   TRIG 106 12/15/2023 0923   HDL 55 12/15/2023 0923   HDL 48 10/14/2023 1423   CHOLHDL 3.8 12/15/2023 0923   VLDL 31 09/25/2007 2058   LDLCALC 132 (H) 12/15/2023 0923   LABVLDL 31 10/14/2023 1423   65" 154 lbs 02/06/2024 163 lbs 10/2023 per patient  Patient lives with her family.  She does the shopping and cooking. She is not employed and cares for her sick husband. She is walking for one hour each evening. She is from [place], [place] and has lived in [place] for a long time. She states that the doctor in her country said she should only eat from 11 am - 6 pm and only eat salad after 6 pm if hungry.  She states that they communicated on-line and has received this advice about 1 month ago and is trying to follow this.  Height 5\' 5"  (1.651 m), weight 154 lb (69.9 kg), last menstrual period 06/02/2013. Body mass index is 25.63 kg/m.   Diabetes Self-Management Education - 02/06/24 1542       Visit Information   Visit Type  First/Initial      Initial Visit   Diabetes Type Type 2    Date Diagnosed 2015    Are you currently following a meal plan? Yes    What type of meal plan do you follow? diabetic    Are you taking your medications as prescribed? Yes      Health Coping   How would you rate your overall health? Fair      Psychosocial Assessment   Patient Belief/Attitude about Diabetes Motivated to manage diabetes    What is the hardest part about your diabetes right now, causing you the most concern, or is the most worrisome to you about your diabetes?   Being active    Self-care barriers None;English as a second language    Self-management support Doctor's office    Other persons present Patient    Patient Concerns Healthy Lifestyle;Glycemic Control    Special Needs None    Preferred Learning Style No preference indicated    Learning Readiness Ready    How often do you need to have someone help you when you read instructions, pamphlets, or other written materials from your doctor or pharmacy? 1 - Never    What is the last grade level you completed in school? 12      Pre-Education Assessment   Patient understands the diabetes disease and treatment process. Needs Review    Patient understands incorporating nutritional management into lifestyle. Needs  Review    Patient undertands incorporating physical activity into lifestyle. Needs Review    Patient understands using medications safely. Needs Review    Patient understands monitoring blood glucose, interpreting and using results Needs Review    Patient understands prevention, detection, and treatment of acute complications. Needs Review    Patient understands prevention, detection, and treatment of chronic complications. Needs Review    Patient understands how to develop strategies to address psychosocial issues. Needs Review    Patient understands how to develop strategies to promote health/change behavior. Needs Review      Complications   Last HgB  A1C per patient/outside source 9.6 %   /10/25 increased from 8.4% 10/14/2023   How often do you check your blood sugar? 3-4 times/day    Fasting Blood glucose range (mg/dL) 161-096    Postprandial Blood glucose range (mg/dL) >045;409-811    Number of hypoglycemic episodes per month 0    Number of hyperglycemic episodes ( >200mg /dL): Weekly    Can you tell when your blood sugar is high? Yes    What do you do if your blood sugar is high? walk and water    Have you had a dilated eye exam in the past 12 months? Yes    Have you had a dental exam in the past 12 months? Yes    Are you checking your feet? Yes    How many days per week are you checking your feet? 7      Dietary Intake   Breakfast boiled egg, unsweetened tea with milk    Snack (morning) none    Lunch chicken or other meat or fish, occasional small portion rice, vegetables    Snack (afternoon) fruit    Dinner meat, salad    Snack (evening) none    Beverage(s) water, 2% or whole milk, unsweetened tea      Activity / Exercise   Activity / Exercise Type Light (walking / raking leaves)    How many days per week do you exercise? 7    How many minutes per day do you exercise? 60    Total minutes per week of exercise 420      Patient Education   Previous Diabetes Education Yes (please comment)   from her country   Disease Pathophysiology Definition of diabetes, type 1 and 2, and the diagnosis of diabetes    Healthy Eating Role of diet in the treatment of diabetes and the relationship between the three main macronutrients and blood glucose level;Food label reading, portion sizes and measuring food.;Plate Method;Meal options for control of blood glucose level and chronic complications.    Being Active Role of exercise on diabetes management, blood pressure control and cardiac health.    Medications Reviewed patients medication for diabetes, action, purpose, timing of dose and side effects.    Monitoring Identified appropriate SMBG and/or  A1C goals.;Daily foot exams;Yearly dilated eye exam    Acute complications Taught prevention, symptoms, and  treatment of hypoglycemia - the 15 rule.;Discussed and identified patients' prevention, symptoms, and treatment of hyperglycemia.    Chronic complications Relationship between chronic complications and blood glucose control;Assessed and discussed foot care and prevention of foot problems    Diabetes Stress and Support Identified and addressed patients feelings and concerns about diabetes;Worked with patient to identify barriers to care and solutions;Role of stress on diabetes    Lifestyle and Health Coping Lifestyle issues that need to be addressed for better diabetes care  Individualized Goals (developed by patient)   Nutrition General guidelines for healthy choices and portions discussed    Physical Activity Exercise 5-7 days per week;60 minutes per day    Medications take my medication as prescribed    Monitoring  Test my blood glucose as discussed    Problem Solving Eating Pattern;Addressing barriers to behavior change    Reducing Risk examine blood glucose patterns;do foot checks daily;treat hypoglycemia with 15 grams of carbs if blood glucose less than 70mg /dL      Post-Education Assessment   Patient understands the diabetes disease and treatment process. Demonstrates understanding / competency    Patient understands incorporating nutritional management into lifestyle. Comprehends key points    Patient undertands incorporating physical activity into lifestyle. Demonstrates understanding / competency    Patient understands using medications safely. Demonstrates understanding / competency    Patient understands monitoring blood glucose, interpreting and using results Demonstrates understanding / competency    Patient understands prevention, detection, and treatment of acute complications. Demonstrates understanding / competency    Patient understands prevention, detection, and  treatment of chronic complications. Demonstrates understanding / competency    Patient understands how to develop strategies to address psychosocial issues. Demonstrates understanding / competency    Patient understands how to develop strategies to promote health/change behavior. Demonstrates understanding / competency      Outcomes   Expected Outcomes Demonstrated interest in learning. Expect positive outcomes    Future DMSE PRN    Program Status Completed             Individualized Plan for Diabetes Self-Management Training:   Learning Objective:  Patient will have a greater understanding of diabetes self-management. Patient education plan is to attend individual and/or group sessions per assessed needs and concerns.   Plan:   Patient Instructions  Plan:  Aim for 3-4 Carb Choices per meal (45-60 grams) +/- 1 either way  Aim for 0-1 Carbs per snack if hungry  Include protein in moderation with your meals and snacks Consider reading food labels for Total Carbohydrate of foods Consider  increasing your activity level by walking for 1 hour minutes daily as tolerated Continue checking BG at alternate times per day  Continue taking medication as directed by MD  Expected Outcomes:  Demonstrated interest in learning. Expect positive outcomes  Education material provided: ADA - How to Thrive: A Guide for Your Journey with Diabetes, Food label handouts, Meal plan card, and Diabetes Resources  If problems or questions, patient to contact team via:  Phone  Future DSME appointment: PRN

## 2024-02-12 ENCOUNTER — Telehealth: Payer: Self-pay | Admitting: *Deleted

## 2024-02-12 ENCOUNTER — Other Ambulatory Visit (HOSPITAL_COMMUNITY): Payer: Self-pay

## 2024-02-12 ENCOUNTER — Telehealth: Payer: Self-pay

## 2024-02-12 DIAGNOSIS — E119 Type 2 diabetes mellitus without complications: Secondary | ICD-10-CM

## 2024-02-12 DIAGNOSIS — I10 Essential (primary) hypertension: Secondary | ICD-10-CM

## 2024-02-12 MED ORDER — AMLODIPINE BESYLATE 10 MG PO TABS
10.0000 mg | ORAL_TABLET | Freq: Every day | ORAL | 3 refills | Status: AC
  Filled 2024-02-12 – 2024-03-04 (×2): qty 90, 90d supply, fill #0
  Filled 2024-06-15: qty 90, 90d supply, fill #1
  Filled 2024-09-08 (×2): qty 90, 90d supply, fill #2

## 2024-02-12 NOTE — Progress Notes (Signed)
 Care Guide Pharmacy Note  02/12/2024 Name: Crystal [name] [id number] MRN: [id number] DOB: 07/25/1961  Referred By: [name], NP Reason for referral: Complex Care Management (Incoming call to schedule with PharmD )   Crystal [name] is a 63 y.o. year old female who is a primary care patient of [name], NP.  Crystal [name] was referred to the pharmacist for assistance related to: DMII  Successful contact was made with the patient to discuss pharmacy services including being ready for the pharmacist to call at least 5 minutes before the scheduled appointment time and to have medication bottles and any blood pressure readings ready for review. The patient agreed to meet with the pharmacist via Face to Face in office appointment  on (date/time).June 4 10:30 AM   [facility]  [facility], [facility] Guide  Direct Dial: [phone]  Fax [phone]

## 2024-02-12 NOTE — Telephone Encounter (Signed)
 Pharmacy Patient Advocate Encounter  Received notification from [facility] that Prior Authorization for Jardiance  25MG  tablets has been APPROVED from 01-06-2024 to 01-05-2025   PA #/Case ID/Reference #: [id number]

## 2024-02-12 NOTE — Telephone Encounter (Signed)
 Care guide outreached patient to schedule appointment with pharmacy for HTN and DM. Patient relayed that she is out of her amlodipine . Will collaborate with PCP to place orders.   Arthea Larsson, PharmD PGY1 Pharmacy Resident

## 2024-02-23 ENCOUNTER — Other Ambulatory Visit (HOSPITAL_COMMUNITY): Payer: Self-pay

## 2024-02-24 ENCOUNTER — Ambulatory Visit: Payer: BLUE CROSS/BLUE SHIELD | Admitting: Dermatology

## 2024-02-26 ENCOUNTER — Other Ambulatory Visit

## 2024-03-02 ENCOUNTER — Ambulatory Visit (INDEPENDENT_AMBULATORY_CARE_PROVIDER_SITE_OTHER): Admitting: "Endocrinology

## 2024-03-02 ENCOUNTER — Encounter: Payer: Self-pay | Admitting: "Endocrinology

## 2024-03-02 VITALS — BP 160/80 | HR 94 | Ht 65.0 in | Wt 153.0 lb

## 2024-03-02 DIAGNOSIS — Z7984 Long term (current) use of oral hypoglycemic drugs: Secondary | ICD-10-CM

## 2024-03-02 DIAGNOSIS — E042 Nontoxic multinodular goiter: Secondary | ICD-10-CM | POA: Diagnosis not present

## 2024-03-02 DIAGNOSIS — E1165 Type 2 diabetes mellitus with hyperglycemia: Secondary | ICD-10-CM

## 2024-03-02 DIAGNOSIS — E0500 Thyrotoxicosis with diffuse goiter without thyrotoxic crisis or storm: Secondary | ICD-10-CM

## 2024-03-02 DIAGNOSIS — E7800 Pure hypercholesterolemia, unspecified: Secondary | ICD-10-CM

## 2024-03-02 NOTE — Patient Instructions (Signed)

## 2024-03-02 NOTE — Progress Notes (Signed)
 Outpatient Endocrinology Note [name], MD    Crystal [name] [birth year] [id number]  Referring Provider: [name], NP Primary Care Provider: [name], NP Reason for consultation: Subjective   Assessment & Plan  Diagnoses and all orders for this visit:  Multinodular goiter  Graves disease  Type 2 diabetes mellitus with hyperglycemia, without long-term current use of insulin  (HCC)  Long term (current) use of oral hypoglycemic drugs  Pure hypercholesterolemia    History of hyperthyroidism, was prescribed methimazole  10 mg but patient did not start 12/2023 TSI -ve, TRAb + On methimazole  10 mg tid, denies any side effects Ordered repeat labs today  Prominent goiter, especially on the right side noted S/p L thyroidectomy in 1993   Patient reports that she was was previously recommended thyroidectomy but could not afford it 12/2023 thyroid  ultrasound: per report: Stable size of right superior and inferior thyroid  nodules. These nodules are stable since the prior sonogram 4 years ago. Both nodules have been previously biopsied with negative cytology. Status post left thyroidectomy. Confirmed that patient had FNA in 2014 per out records on right sided nodules Continue to monitor clinically    Type 2 diabetes with hyperglycemia 12/2023 C-peptide positive, MA/CR negative Patient did not bring her meter but reports that her blood sugars running between 130-200 Currently on metformin  500 mg 2 pills twice daily, Januvia  100 mg every day and Jardiance  25 mg daily Discussed with patient the need to start insulin  given current A1c of 9.6 but patient is absolutely against insulin  Ordered diabetes education 03/02/24 Patient started Jardiance  and likes it except that it gives him urinary burning, instructed to stop it and resume back her glimepiride  4 mg 2 pills in the morning and bring her blood sugar meter next time Patient wants to try lifestyle changes  before she would consider insulin   12/2023 Hypercholesterolemia with LDL of 132 Patient is on simvastatin  20 mg daily with elevated LFTs in the past, currently normal Will increase the dose once patient is settled on diabetes medications   Return in about 3 months (around 06/02/2024) for visit + labs before next visit, labs today.   I have reviewed current medications, nurse's notes, allergies, vital signs, past medical and surgical history, family medical history, and social history for this encounter. Counseled patient on symptoms, examination findings, lab findings, imaging results, treatment decisions and monitoring and prognosis. The patient understood the recommendations and agrees with the treatment plan. All questions regarding treatment plan were fully answered.  [name], MD  03/02/24   History of Present Illness HPI  Crystal [name] is a 63 y.o. year old female who presents for evaluation of diabetes type II and hyperthyroidism.  Reports mild weight loss, hair fall is stable  Denies cold/heat intolerance Denies hyperdefecation/palpitations No dysphagia/dyspnea/dysphonia   Adverse Drug Effects from Methimazole  (MMI): rash No fever No throat pain No arthritis No mouth ulcers No jaundice No loss of appetite No lymphadenopathy No  Diagnosed on DM2 around 2013 Denies MI/stroke, neuropathy, retinopathy, nephropathy Did not bring meter, checking tid  Bg 130-185 per recall On metformin  500 mg 2 pills bid Januvia  100 mg every day Jardiance  25 mg every day Stopped glimepiride  4 mg 2 pills in morning Simvastatin  20 mg every day   Physical Exam  BP (!) 160/80   Pulse 94   Ht 5\' 5"  (1.651 m)   Wt 153 lb (69.4 kg)   LMP 06/02/2013   SpO2 98%   BMI 25.46 kg/m  Constitutional: well developed, well nourished Head: normocephalic, atraumatic Eyes: sclera anicteric, no redness Neck: supple Lungs: normal respiratory effort Neurology: alert and  oriented Skin: dry, no appreciable rashes Musculoskeletal: no appreciable defects Psychiatric: normal mood and affect   Current Medications Patient's Medications  New Prescriptions   No medications on file  Previous Medications   ACETAMINOPHEN  (TYLENOL ) 500 MG TABLET    Take 1,000 mg by mouth daily as needed for moderate pain.    AMLODIPINE  (NORVASC ) 10 MG TABLET    Take 1 tablet (10 mg total) by mouth daily.   BLOOD GLUCOSE METER KIT AND SUPPLIES    One Touch Verio. Use up to four times daily as directed. (FOR ICD-10 E10.9, E11.9).   CETIRIZINE  (ZYRTEC  ALLERGY) 10 MG TABLET    Take 1 tablet (10 mg total) by mouth at bedtime.   CICLOPIROX  1 % SHAMPOO    Apply to affected area at bedtime.   EMPAGLIFLOZIN  (JARDIANCE ) 25 MG TABS TABLET    Take 1/2 tablet (12.5 mg) for 2 weeks and then 1 tablet (25 mg) thereafter.   FLUOCINONIDE  OINTMENT (LIDEX ) 0.05 %    Apply daily to scalp once daily for 2 weeks. Then for maintenance, use 2-3 times weekly.   FLUTICASONE  (FLONASE ) 50 MCG/ACT NASAL SPRAY    Place 1 spray into both nostrils 2 (two) times daily as needed (nasal congestion).   GLIMEPIRIDE  (AMARYL ) 4 MG TABLET    Take 2 tablets (8 mg total) by mouth daily before breakfast.   HYDROQUINONE  4 % CREAM    Apply a thin layer to dark circles around eyes nightly for 2-3 months. Then stop for 1-2 months. If discoloration recurs may restart cycle.   JANUVIA  100 MG TABLET    Take 1 tablet (100 mg total) by mouth daily.   LANCETS (ONETOUCH ULTRASOFT) LANCETS    Use as instructed   LIDOCAINE  (LIDODERM ) 5 %    Place 1 patch onto the skin daily. Remove & Discard patch within 12 hours or as directed by MD   METFORMIN  (GLUCOPHAGE ) 500 MG TABLET    Take 2 tablets (1,000 mg total) by mouth 2 (two) times daily with a meal.   METHIMAZOLE  (TAPAZOLE ) 10 MG TABLET    Take 1 tablet (10 mg total) by mouth 3 (three) times daily.   PANTOPRAZOLE  (PROTONIX ) 40 MG TABLET    Take 1 tablet (40 mg total) by mouth daily.    SALICYLIC ACID  6 % SHAM    Apply 1 Application topically daily as needed.   SIMVASTATIN  (ZOCOR ) 20 MG TABLET    Take 1 tablet (20 mg total) by mouth every evening.  Modified Medications   No medications on file  Discontinued Medications   No medications on file    Allergies Allergies  Allergen Reactions   Aspirin Nausea And Vomiting   Tramadol      Upset stomach     Past Medical History Past Medical History:  Diagnosis Date   Allergy    Diabetes mellitus    GERD (gastroesophageal reflux disease)    Hyperlipidemia    Hypertension    Hyperthyroidism    Recurrent upper respiratory infection (URI)     Past Surgical History Past Surgical History:  Procedure Laterality Date   ANTERIOR CERVICAL DECOMP/DISCECTOMY FUSION N/A 10/11/2016   Procedure: ANTERIOR CERVICAL DISCECTOMY FUSION C6-7 WITH EXCISION OF OSTEOPHYTE;  Surgeon: Alphonso Jean, MD;  Location: [facility] OR;  Service: Orthopedics;  Laterality: N/A;   CESAREAN SECTION     TUBAL  LIGATION      Family History family history is not on file.  Social History Social History   Socioeconomic History   Marital status: Married    Spouse name: Not on file   Number of children: Not on file   Years of education: Not on file   Highest education level: Not on file  Occupational History   Not on file  Tobacco Use   Smoking status: Never   Smokeless tobacco: Never  Vaping Use   Vaping status: Never Used  Substance and Sexual Activity   Alcohol use: No   Drug use: No   Sexual activity: Yes  Other Topics Concern   Not on file  Social History Narrative   Not on file   Social Drivers of Health   Financial Resource Strain: Not on file  Food Insecurity: Not on file  Transportation Needs: Not on file  Physical Activity: Not on file  Stress: Not on file  Social Connections: Not on file  Intimate Partner Violence: Not on file    Lab Results  Component Value Date   CHOL 208 (H) 12/15/2023   Lab Results  Component Value  Date   HDL 55 12/15/2023   Lab Results  Component Value Date   LDLCALC 132 (H) 12/15/2023   Lab Results  Component Value Date   TRIG 106 12/15/2023   Lab Results  Component Value Date   CHOLHDL 3.8 12/15/2023   Lab Results  Component Value Date   CREATININE 0.31 (L) 12/15/2023   Lab Results  Component Value Date   GFR 204.34 12/15/2019      Component Value Date/Time   NA 139 12/15/2023 0923   NA 141 10/14/2023 1423   K 4.6 12/15/2023 0923   CL 102 12/15/2023 0923   CO2 26 12/15/2023 0923   GLUCOSE 234 (H) 12/15/2023 0923   BUN 12 12/15/2023 0923   BUN 10 10/14/2023 1423   CREATININE 0.31 (L) 12/15/2023 0923   CALCIUM 9.4 12/15/2023 0923   PROT 6.8 12/15/2023 0923   PROT 6.8 10/14/2023 1423   ALBUMIN 4.0 10/14/2023 1423   AST 15 12/15/2023 0923   ALT 28 12/15/2023 0923   ALKPHOS 133 (H) 10/14/2023 1423   BILITOT 0.5 12/15/2023 0923   BILITOT 0.3 10/14/2023 1423   GFRNONAA 113 08/11/2020 0950   GFRNONAA 117 08/14/2017 1542   GFRAA 131 08/11/2020 0950   GFRAA 136 08/14/2017 1542      Latest Ref Rng & Units 12/15/2023    9:23 AM 10/14/2023    2:23 PM 07/02/2023    2:21 PM  BMP  Glucose 65 - 99 mg/dL 295  284  132   BUN 7 - 25 mg/dL 12  10  8    Creatinine 0.50 - 1.05 mg/dL 4.40  1.02  7.25   BUN/Creat Ratio 6 - 22 (calc) 39  27  24   Sodium 135 - 146 mmol/L 139  141  140   Potassium 3.5 - 5.3 mmol/L 4.6  4.4  4.2   Chloride 98 - 110 mmol/L 102  102  101   CO2 20 - 32 mmol/L 26  23  23    Calcium 8.6 - 10.4 mg/dL 9.4  9.7  9.6        Component Value Date/Time   WBC 8.7 10/14/2023 1423   WBC 7.9 12/15/2019 1558   RBC 5.46 (H) 10/14/2023 1423   RBC 5.36 (H) 12/15/2019 1558   HGB 12.5 10/14/2023 1423   HCT  41.5 10/14/2023 1423   PLT 411 10/14/2023 1423   MCV 76 (L) 10/14/2023 1423   MCH 22.9 (L) 10/14/2023 1423   MCH 23.9 (L) 11/08/2016 1500   MCHC 30.1 (L) 10/14/2023 1423   MCHC 32.0 12/15/2019 1558   RDW 13.6 10/14/2023 1423   LYMPHSABS 2.7  09/26/2021 1547   MONOABS 0.5 12/15/2019 1558   EOSABS 0.1 09/26/2021 1547   BASOSABS 0.0 09/26/2021 1547   Lab Results  Component Value Date   TSH <0.01 (L) 01/30/2024   TSH <0.01 (L) 12/15/2023   TSH <0.005 (L) 07/02/2023   FREET4 2.7 (H) 01/30/2024   FREET4 2.4 (H) 12/15/2023   FREET4 1.79 (H) 05/04/2021         Parts of this note may have been dictated using voice recognition software. There may be variances in spelling and vocabulary which are unintentional. Not all errors are proofread. Please notify the [facility] if any discrepancies are noted or if the meaning of any statement is not clear.

## 2024-03-03 ENCOUNTER — Other Ambulatory Visit (HOSPITAL_COMMUNITY): Payer: Self-pay

## 2024-03-03 ENCOUNTER — Ambulatory Visit: Payer: Self-pay | Admitting: "Endocrinology

## 2024-03-03 DIAGNOSIS — E0500 Thyrotoxicosis with diffuse goiter without thyrotoxic crisis or storm: Secondary | ICD-10-CM

## 2024-03-03 LAB — TSH: TSH: <0.01 m[IU]/L — ABNORMAL LOW (ref 0.40–4.50)

## 2024-03-03 LAB — T3, FREE: T3, Free: 5.7 pg/mL — ABNORMAL HIGH (ref 2.3–4.2)

## 2024-03-03 LAB — T4, FREE: Free T4: 2 ng/dL — ABNORMAL HIGH (ref 0.8–1.8)

## 2024-03-03 MED ORDER — METHIMAZOLE 10 MG PO TABS
20.0000 mg | ORAL_TABLET | Freq: Two times a day (BID) | ORAL | 1 refills | Status: DC
  Filled 2024-03-03: qty 360, 90d supply, fill #0

## 2024-03-03 NOTE — Progress Notes (Signed)
 Please let the patient know that her thyroid  has improved but still uncontrolled.  Please ask her to increase her methimazole  10 mg pills to "2 pills twice a day".  Compliance is important.  Repeat labs before next visit.  Thanks

## 2024-03-04 ENCOUNTER — Ambulatory Visit (INDEPENDENT_AMBULATORY_CARE_PROVIDER_SITE_OTHER): Admitting: Nurse Practitioner

## 2024-03-04 ENCOUNTER — Other Ambulatory Visit (HOSPITAL_COMMUNITY): Payer: Self-pay

## 2024-03-04 ENCOUNTER — Encounter: Payer: Self-pay | Admitting: Nurse Practitioner

## 2024-03-04 VITALS — BP 145/63 | HR 95 | Temp 98.4°F | Wt 153.6 lb

## 2024-03-04 DIAGNOSIS — R350 Frequency of micturition: Secondary | ICD-10-CM | POA: Diagnosis not present

## 2024-03-04 DIAGNOSIS — E1165 Type 2 diabetes mellitus with hyperglycemia: Secondary | ICD-10-CM | POA: Diagnosis not present

## 2024-03-04 DIAGNOSIS — B372 Candidiasis of skin and nail: Secondary | ICD-10-CM

## 2024-03-04 LAB — POCT URINALYSIS DIP (CLINITEK)
Bilirubin, UA: NEGATIVE
Blood, UA: NEGATIVE
Glucose, UA: >=1000 mg/dL — AB
Ketones, POC UA: NEGATIVE mg/dL
Leukocytes, UA: NEGATIVE
Nitrite, UA: NEGATIVE
POC PROTEIN,UA: NEGATIVE
Spec Grav, UA: 1.01 (ref 1.010–1.025)
Urobilinogen, UA: 0.2 U/dL — AB
pH, UA: 6 (ref 5.0–8.0)

## 2024-03-04 MED ORDER — FLUCONAZOLE 150 MG PO TABS
150.0000 mg | ORAL_TABLET | Freq: Every day | ORAL | 0 refills | Status: DC
  Filled 2024-03-04 (×2): qty 2, 2d supply, fill #0

## 2024-03-04 NOTE — Progress Notes (Signed)
 Subjective   Patient ID: Crystal [name], female    DOB: Apr 24, 1961, 63 y.o.   MRN: [id number]  Chief Complaint  Patient presents with   Urinary Tract Infection    Pain when she goes to the bathrom    Referring provider: [name], NP  Nat Badger Crystal [name] is a 63 y.o. female with Past Medical History: No date: Allergy No date: Diabetes mellitus No date: GERD (gastroesophageal reflux disease) No date: Hyperlipidemia No date: Hypertension No date: Hyperthyroidism No date: Recurrent upper respiratory infection (URI)   HPI  Patient presents today with urinary tract infection symptoms.  She has been having irritation frequency and urgency.  She has been on Jardiance  and thinks that this is what has caused her irritation.  Overall UA did not show any bacteria.  We discussed that this could be a yeast infection.  We will trial Diflucan.  She has spoke with endocrinology and they advised her to stop Jardiance . Denies f/c/s, n/v/d, hemoptysis, PND, leg swelling Denies chest pain or edema     Allergies  Allergen Reactions   Aspirin Nausea And Vomiting   Tramadol      Upset stomach     Immunization History  Administered Date(s) Administered   Influenza,inj,Quad PF,6+ Mos 07/15/2017   Tdap 11/13/2016    Tobacco History: Social History   Tobacco Use  Smoking Status Never  Smokeless Tobacco Never   Counseling given: Not Answered   Outpatient Encounter Medications as of 03/04/2024  Medication Sig   acetaminophen  (TYLENOL ) 500 MG tablet Take 1,000 mg by mouth daily as needed for moderate pain.    amLODipine  (NORVASC ) 10 MG tablet Take 1 tablet (10 mg total) by mouth daily.   blood glucose meter kit and supplies One Touch Verio. Use up to four times daily as directed. (FOR ICD-10 E10.9, E11.9).   cetirizine  (ZYRTEC  ALLERGY) 10 MG tablet Take 1 tablet (10 mg total) by mouth at bedtime.   Ciclopirox  1 % shampoo Apply to affected area at bedtime.   empagliflozin   (JARDIANCE ) 25 MG TABS tablet Take 1/2 tablet (12.5 mg) for 2 weeks and then 1 tablet (25 mg) thereafter.   fluconazole (DIFLUCAN) 150 MG tablet Take 1 tablet (150 mg total) by mouth daily. Take second tablet 3 days later   fluocinonide  ointment (LIDEX ) 0.05 % Apply daily to scalp once daily for 2 weeks. Then for maintenance, use 2-3 times weekly.   fluticasone  (FLONASE ) 50 MCG/ACT nasal spray Place 1 spray into both nostrils 2 (two) times daily as needed (nasal congestion).   hydroquinone  4 % cream Apply a thin layer to dark circles around eyes nightly for 2-3 months. Then stop for 1-2 months. If discoloration recurs may restart cycle.   JANUVIA  100 MG tablet Take 1 tablet (100 mg total) by mouth daily.   Lancets (ONETOUCH ULTRASOFT) lancets Use as instructed   lidocaine  (LIDODERM ) 5 % Place 1 patch onto the skin daily. Remove & Discard patch within 12 hours or as directed by MD   metFORMIN  (GLUCOPHAGE ) 500 MG tablet Take 2 tablets (1,000 mg total) by mouth 2 (two) times daily with a meal.   methimazole  (TAPAZOLE ) 10 MG tablet Take 2 tablets (20 mg total) by mouth 2 (two) times daily.   pantoprazole  (PROTONIX ) 40 MG tablet Take 1 tablet (40 mg total) by mouth daily.   Salicylic Acid  6 % SHAM Apply 1 Application topically daily as needed.   simvastatin  (ZOCOR ) 20 MG tablet Take 1 tablet (20 mg total)  by mouth every evening.   glimepiride  (AMARYL ) 4 MG tablet Take 2 tablets (8 mg total) by mouth daily before breakfast. (Patient not taking: Reported on 03/04/2024)   Facility-Administered Encounter Medications as of 03/04/2024  Medication   MEDERMA gel    Review of Systems  Review of Systems  Constitutional: Negative.   HENT: Negative.    Cardiovascular: Negative.   Gastrointestinal: Negative.   Allergic/Immunologic: Negative.   Neurological: Negative.   Psychiatric/Behavioral: Negative.       Objective:   BP (!) 145/63   Pulse 95   Temp 98.4 F (36.9 C) (Oral)   Wt 153 lb 9.6 oz  (69.7 kg)   LMP 06/02/2013   SpO2 95%   BMI 25.56 kg/m   Wt Readings from Last 5 Encounters:  03/04/24 153 lb 9.6 oz (69.7 kg)  03/02/24 153 lb (69.4 kg)  02/06/24 154 lb (69.9 kg)  02/04/24 155 lb (70.3 kg)  01/26/24 157 lb 6.4 oz (71.4 kg)     Physical Exam Vitals and nursing note reviewed.  Constitutional:      General: She is not in acute distress.    Appearance: She is well-developed.  Cardiovascular:     Rate and Rhythm: Normal rate and regular rhythm.  Pulmonary:     Effort: Pulmonary effort is normal.     Breath sounds: Normal breath sounds.  Neurological:     Mental Status: She is alert and oriented to person, place, and time.       Assessment & Plan:   Yeast dermatitis -     Fluconazole; Take 1 tablet (150 mg total) by mouth daily. Take second tablet 3 days later  Dispense: 2 tablet; Refill: 0  Urine frequency -     POCT URINALYSIS DIP (CLINITEK)  Type 2 diabetes mellitus with hyperglycemia, without long-term current use of insulin  (HCC) -     CBC; Future -     Comprehensive metabolic panel with GFR; Future     Return in about 3 months (around 06/04/2024).   [name], NP 03/04/2024

## 2024-03-04 NOTE — Patient Instructions (Signed)
 1. Yeast dermatitis (Primary)  - fluconazole  (DIFLUCAN ) 150 MG tablet; Take 1 tablet (150 mg total) by mouth daily. Take second tablet 3 days later  Dispense: 2 tablet; Refill: 0  2. Urine frequency  - POCT URINALYSIS DIP (CLINITEK)  3. Type 2 diabetes mellitus with hyperglycemia, without long-term current use of insulin  (HCC)  - CBC; Future - Comprehensive metabolic panel with GFR; Future

## 2024-03-10 ENCOUNTER — Telehealth: Payer: Self-pay | Admitting: Nurse Practitioner

## 2024-03-10 ENCOUNTER — Telehealth: Payer: Self-pay

## 2024-03-10 ENCOUNTER — Ambulatory Visit: Payer: Self-pay

## 2024-03-10 NOTE — Telephone Encounter (Signed)
 Copied from CRM [id number]. Topic: General - Other >> Mar 10, 2024  9:59 AM Carlatta H wrote: Reason for CRM:  Patient has an appointment scheduled today: Face to Face appointment with [facility] PharmD mangement of DMII Please call to reschedule

## 2024-03-10 NOTE — Progress Notes (Deleted)
 03/10/2024 Name: Crystal [name] Genet MRN: [id number] DOB: [birth year]  No chief complaint on file.   Crystal [name] is a 63 y.o. year old female who was referred for medication management by their primary care provider, Crystal Morel, NP. They presented for a face to face visit today.  PMH includes HTN, T2DM, hyperthyroidism, HLD.    They were referred to the pharmacist by their PCP for assistance in managing diabetes, hypertension, and hyperlipidemia   Subjective: Patient was last seen by PCP Crystal [name] on 03/04/24. BP was uncontrolled at 145/63 mmHg in the setting of uncontrolled hyperthyroidism (HR 95 bpm). Patient presented at that appt with s/sx of a UTI. She had not stopped Jardiance  as previously instructed by endocrinology, and these instructions were reiterated. She was also given an Rx for fluconazole  150 mg x1 dose.   Get labs today and cancel appt tomorrow: CMET, CBC, A1C  Hyperthyroid uncontrolled; supposed to increase methimazole  to 20 mg BID per Dr. Vertell Gory - repeat labs before next visit  Care Team: Primary Care Provider: [name], NP ; Next Scheduled Visit: 06/04/24 Endocrinologist [name], MD; Next Scheduled Visit: 06/01/24  Medication Access/Adherence  Current Pharmacy:  [name] - [facility] [address] [place] [address] Phone: [phone] Fax: [phone]  [facility] [address] [place] [address] Phone: [phone] Fax: [phone]  [facility] [address] [place] - [address] [place] [address] Phone: [phone] Fax: [phone]  [facility] - [facility] [address] [place] [address] Phone: [phone] Fax: [phone]   Patient reports affordability concerns with their medications: {YES/NO:21197} Patient reports  access/transportation concerns to their pharmacy: {YES/NO:21197} Patient reports adherence concerns with their medications:  {YES/NO:21197} ***  Fill history is appropriate   Diabetes:  Current medications: metformin  IR 1000 mg BID with meals, glimepiride  8 mg daily before breakfast, Januvia  100 mg daily, empagliflozin  25 mg daily (should have been discontinued) Medications tried in the past:   Current glucose readings: *** Using *** meter; testing *** times daily  Date of Download: *** % Time CGM is active: ***% Average Glucose: *** mg/dL Glucose Management Indicator: ***  Glucose Variability: *** (goal <36%) Time in Goal:  - Time in range 70-180: ***% - Time above range: ***% - Time below range: ***% Observed patterns:  Patient {Actions; denies-reports:120008} hypoglycemic s/sx including ***dizziness, shakiness, sweating. Patient {Actions; denies-reports:120008} hyperglycemic symptoms including ***polyuria, polydipsia, polyphagia, nocturia, neuropathy, blurred vision.  Current meal patterns:  - Breakfast: *** - Lunch *** - Supper *** - Snacks *** - Drinks ***  Current physical activity: ***  Current medication access support: none- Medicaid insurance  Hypertension:   Current medications: amlodipine  10 mg daily Medications previously tried:   Patient {HAS/DOES NOT RKYH:06237} a validated, automated, upper arm home BP cuff Current blood pressure readings readings: ***  Patient {Actions; denies-reports:120008} hypotensive s/sx including ***dizziness, lightheadedness.  Patient {Actions; denies-reports:120008} hypertensive symptoms including ***headache, chest pain, shortness of breath  Current meal patterns: ***  Current physical activity: ***   Hyperlipidemia/ASCVD Risk Reduction  Current lipid lowering medications: simvastatin  20 mg daily Medications tried in the past:   Antiplatelet regimen:   ASCVD History:  Family History:  Risk Factors:   Current  physical activity: ***  Current medication access support: ***  Clinical ASCVD: {YES/NO:21197} The 10-year ASCVD risk score (Arnett DK, et al., 2019) is:  24.4%   Values used to calculate the score:     Age: 63 years     Sex: Female     Is Non-Hispanic African American: Yes     Diabetic: Yes     Tobacco smoker: No     Systolic Blood Pressure: 145 mmHg     Is BP treated: Yes     HDL Cholesterol: 55 mg/dL     Total Cholesterol: 208 mg/dL    PREVENT Risk Score: 10 year risk of CVD: *** - 10 year risk of ASCVD: *** - 10 year risk of HF: ***   Objective:  BP Readings from Last 3 Encounters:  03/04/24 (!) 145/63  03/02/24 (!) 160/80  02/04/24 130/70    Lab Results  Component Value Date   HGBA1C 9.6 (H) 12/15/2023   UACR 12/15/23: 21 mg/g  Lab Results  Component Value Date   CREATININE 0.31 (L) 12/15/2023   BUN 12 12/15/2023   NA 139 12/15/2023   K 4.6 12/15/2023   CL 102 12/15/2023   CO2 26 12/15/2023    Lab Results  Component Value Date   CHOL 208 (H) 12/15/2023   HDL 55 12/15/2023   LDLCALC 132 (H) 12/15/2023   TRIG 106 12/15/2023   CHOLHDL 3.8 12/15/2023    Medications Reviewed Today   Medications were not reviewed in this encounter       Assessment/Plan:   Diabetes: - Currently {CHL Controlled/Uncontrolled:4126790972} - Reviewed long term cardiovascular and renal outcomes of uncontrolled blood sugar - Reviewed goal A1c, goal fasting, and goal 2 hour post prandial glucose - Reviewed dietary modifications including *** - Reviewed lifestyle modifications including:  - Recommend to ***  - Patient denies personal or family history of multiple endocrine neoplasia type 2, medullary thyroid  cancer; personal history of pancreatitis or gallbladder disease. - Recommend to check glucose *** - Meets financial criteria for *** patient assistance program through ***. Will collaborate with provider, CPhT, and patient to pursue assistance.       Hypertension: - Currently {CHL Controlled/Uncontrolled:4126790972} - Reviewed long term cardiovascular and renal outcomes of uncontrolled blood pressure - Reviewed appropriate blood pressure monitoring technique and reviewed goal blood pressure. Recommended to check home blood pressure and heart rate *** - Recommend to ***      Hyperlipidemia/ASCVD Risk Reduction: - Currently {CHL Controlled/Uncontrolled:4126790972}.  - Reviewed long term complications of uncontrolled cholesterol - Reviewed dietary recommendations including *** - Reviewed lifestyle recommendations including *** - Recommend to ***  - Meets financial criteria for *** patient assistance program through ***. Will collaborate with provider, CPhT, and patient to pursue assistance.    Follow Up Plan: ***  ***

## 2024-03-11 ENCOUNTER — Other Ambulatory Visit: Payer: Self-pay

## 2024-03-17 ENCOUNTER — Other Ambulatory Visit

## 2024-03-24 ENCOUNTER — Other Ambulatory Visit: Payer: Self-pay

## 2024-03-24 ENCOUNTER — Telehealth: Payer: Self-pay

## 2024-03-24 ENCOUNTER — Other Ambulatory Visit (HOSPITAL_COMMUNITY): Payer: Self-pay

## 2024-03-24 ENCOUNTER — Ambulatory Visit (INDEPENDENT_AMBULATORY_CARE_PROVIDER_SITE_OTHER): Payer: Self-pay

## 2024-03-24 VITALS — BP 149/55 | HR 96

## 2024-03-24 DIAGNOSIS — I10 Essential (primary) hypertension: Secondary | ICD-10-CM

## 2024-03-24 DIAGNOSIS — E119 Type 2 diabetes mellitus without complications: Secondary | ICD-10-CM

## 2024-03-24 LAB — POCT GLYCOSYLATED HEMOGLOBIN (HGB A1C): HbA1c, POC (controlled diabetic range): 8.5 % — AB (ref 0.0–7.0)

## 2024-03-24 MED ORDER — TRULICITY 0.75 MG/0.5ML ~~LOC~~ SOAJ
0.7500 mg | SUBCUTANEOUS | 0 refills | Status: DC
  Filled 2024-03-24 – 2024-04-24 (×2): qty 2, 28d supply, fill #0

## 2024-03-24 MED ORDER — BLOOD PRESSURE MONITOR DEVI: 0 refills | Status: AC

## 2024-03-24 MED ORDER — ROSUVASTATIN CALCIUM 20 MG PO TABS
20.0000 mg | ORAL_TABLET | Freq: Every day | ORAL | 3 refills | Status: DC
  Filled 2024-03-24 – 2024-04-24 (×2): qty 90, 90d supply, fill #0

## 2024-03-24 MED ORDER — TRULICITY 1.5 MG/0.5ML ~~LOC~~ SOAJ
1.5000 mg | SUBCUTANEOUS | 1 refills | Status: DC
  Filled 2024-03-24: qty 2, 28d supply, fill #0

## 2024-03-24 NOTE — Telephone Encounter (Signed)
 Patient is agreeable to initiating Trulicity 0.75 mg once weekly. Prescription has been sent to [facility]. Prior authorization required. Will collaborate with PA team to complete authorization.   Arthea Larsson, PharmD PGY1 Pharmacy Resident

## 2024-03-24 NOTE — Telephone Encounter (Signed)
 Pharmacy Patient Advocate Encounter   Received notification from CoverMyMeds that prior authorization for TRULICITY is required/requested.   Insurance verification completed.   The patient is insured through [facility] MEDICAID .   Per test claim: PA required; PA submitted to above mentioned insurance via CoverMyMeds Key/confirmation #/EOC [id number] Status is pending

## 2024-03-24 NOTE — Patient Instructions (Addendum)
 It was great to see you today. Your A1C was 8.5%.  For your blood sugars Stop Januvia . START Trulicity 0.75 mg injected into the skin once week. Continue metformin  1000 mg twice daily Continue glimepiride  8 mg (2 tablets) once daily with breakfast Bring your blood sugar meter to your next appointment  For your blood pressure Continue amlodipine  10 mg daily Start monitoring your blood pressures at home and keep a list to bring to your next appointment.   For your cholesterol Stop simvastatin  and START rosuvastatin 20 mg daily. Take this every day.   Increase your methimazole  to 2 tablets (20 mg) twice daily, as previously instructed by Dr. Vertell Gory (endocrinology)  Start using a pill box (with twice daily spots) so you can remember if you have taken your medications

## 2024-03-24 NOTE — Telephone Encounter (Signed)
 Pharmacy Patient Advocate Encounter  Received notification from [facility] MEDICAID that Prior Authorization for TRULICITY has been APPROVED from 03/24/2024 to 03/24/2025   PA #/Case ID/Reference #: [id number]

## 2024-03-24 NOTE — Progress Notes (Signed)
 03/24/2024 Name: Crystal [name] MRN: [id number] DOB: April 03, 1961  Chief Complaint  Patient presents with   Diabetes   Hypertension   Hyperlipidemia    Crystal [name] is a 63 y.o. year old female who was referred for medication management by their primary care provider, [name], NP. They presented for a face to face visit today.  PMH includes HTN, T2DM, hyperthyroidism, HLD.    They were referred to the pharmacist by their PCP for assistance in managing diabetes, hypertension, and hyperlipidemia   Subjective: Patient was last seen by PCP [name] on 03/04/24. BP was uncontrolled at 145/63 mmHg in the setting of uncontrolled hyperthyroidism (HR 95 bpm). Patient presented at that appt with s/sx of a UTI. She had not stopped Jardiance  as previously instructed by endocrinology, and these instructions were reiterated. She was also given an Rx for fluconazole  150 mg x1 dose. She did see the nutritionist on 02/06/24 to discuss dietary interventions for diabetes.  Patient reports that she is doing well. Reports that she has made multiple lifestyle changes to assist in diabetes control, and that she has lost 10 lbs in 3 mo. She reports that she does not want to lose a lot more weight. She reports that the fluconazole  did not help her symptoms of burning with urination. She denies flank pain, fever.    Care Team: Primary Care Provider: [name], NP ; Next Scheduled Visit: 06/04/24 Endocrinologist [name], MD; Next Scheduled Visit: 06/01/24  Medication Access/Adherence  Current Pharmacy:  [name] - [facility] [address] [place] [address] Phone: [phone] Fax: [phone]  [facility] [address] [place] [address] Phone: [phone] Fax: [phone]  [facility] [address] [place] - [address] [place]  [address] Phone: [phone] Fax: [phone]  [facility] - [facility] [address] [place] [address] Phone: [phone] Fax: [phone]  [facility] [place], [place] - [address] [place] [address] Phone: [phone] Fax: [phone]   Patient reports affordability concerns with their medications: No  - [name] (pick-up). Wants 3 mo supplies when possible. Patient reports access/transportation concerns to their pharmacy: No  Patient reports adherence concerns with their medications:  Yes  - not taking simvastatin  consistently. Reports that she often forgets whether she has taken her medications that day. She is not currently using a pill box.   Fill history is appropriate  Diabetes:  Current medications: metformin  IR 1000 mg BID with meals, glimepiride  8 mg daily before breakfast, Januvia  100 mg daily,  Medications tried in the past: Jardiance  (frequent yeast infections while A1C > 8%)  Current glucose readings:  Using unknown glucometer; testing two times daily - she does not have glucometer or log with her today. She does not give specific numbers but states that PPG are always < 180 mg/dL but FBG are elevated.  Patient denies hypoglycemic s/sx including dizziness, shakiness, sweating. Patient denies hyperglycemic symptoms including polyuria, polydipsia, polyphagia, nocturia, neuropathy, blurred vision.  Current meal patterns: Reports that she has made significant changes in her diet. Avoiding sugary foods, bread, rice. She has lost 10 lbs in 3 months. She tries not to eat after 6 PM  Current physical activity: She is walking for one hour each evening   Current medication access support: none- Medicaid insurance  Hypertension:  Current medications: amlodipine  10 mg daily Medications previously tried: atenolol   She is unsure whether she took her  amlodipine  this morning.  Patient does not have a validated, automated, upper arm home BP cuff Current blood pressure readings readings: N/A  Patient denies hypotensive s/sx including dizziness, lightheadedness.  Patient denies hypertensive symptoms including headache, chest pain, shortness of breath   Hyperlipidemia/ASCVD Risk Reduction  Current lipid lowering medications: simvastatin  20 mg daily Medications tried in the past: none  Patient does not take this medication regularly. States that she read cholesterol medication is no good. She does not express specific side effects she is concerned about.   Antiplatelet regimen: none  Clinical ASCVD: No  The 10-year ASCVD risk score (Arnett DK, et al., 2019) is: 26.1%   Values used to calculate the score:     Age: 49 years     Clincally relevant sex: Female     Is Non-Hispanic African American: Yes     Diabetic: Yes     Tobacco smoker: No     Systolic Blood Pressure: 149 mmHg     Is BP treated: Yes     HDL Cholesterol: 55 mg/dL     Total Cholesterol: 208 mg/dL    Objective:  BP Readings from Last 3 Encounters:  03/24/24 (!) 149/55  03/04/24 (!) 145/63  03/02/24 (!) 160/80   Lab Results  Component Value Date   HGBA1C 8.5 (A) 03/24/2024   HGBA1C 9.6 (H) 12/15/2023   HGBA1C 8.4 (A) 10/14/2023    Lab Results  Component Value Date   CREATININE 0.31 (L) 12/15/2023   BUN 12 12/15/2023   NA 139 12/15/2023   K 4.6 12/15/2023   CL 102 12/15/2023   CO2 26 12/15/2023    Lab Results  Component Value Date   CHOL 208 (H) 12/15/2023   HDL 55 12/15/2023   LDLCALC 132 (H) 12/15/2023   TRIG 106 12/15/2023   CHOLHDL 3.8 12/15/2023    Medications Reviewed Today     Reviewed by Adra Alanis, RPH (Pharmacist) on 03/24/24 at 1319  Med List Status: <None>   Medication Order Taking? Sig Documenting Provider Last Dose Status Informant  acetaminophen  (TYLENOL ) 500 MG tablet [id number]  Take 1,000 mg by mouth daily as needed for  moderate pain.  [provider]  Active Self  amLODipine  (NORVASC ) 10 MG tablet [id number] Yes Take 1 tablet (10 mg total) by mouth daily. [name], NP  Active   blood glucose meter kit and supplies [id number]  One Touch Verio. Use up to four times daily as directed. (FOR ICD-10 E10.9, E11.9). Gregoria Leas, NP  Active   Blood Pressure Monitor DEVI [id number] Yes Use as directed to monitor blood pressure once daily. Keep a log of your blood pressure readings. [name], NP  Active   cetirizine  (ZYRTEC  ALLERGY) 10 MG tablet [id number]  Take 1 tablet (10 mg total) by mouth at bedtime. [name], NP  Active   Ciclopirox  1 % shampoo [id number]  Apply to affected area at bedtime. [name], NP  Active   Dulaglutide (TRULICITY) 0.75 MG/0.5ML [place] [id number] Yes Inject 0.75 mg into the skin once a week. [name], NP  Active   Dulaglutide (TRULICITY) 1.5 MG/0.5ML [place] [id number] Yes Inject 1.5 mg into the skin once a week. [name], NP  Active     Discontinued 03/24/24 1022 (Change in therapy)     Discontinued 03/24/24 1022 (Completed Course)   fluocinonide   ointment (LIDEX ) 0.05 % [id number]  Apply daily to scalp once daily for 2 weeks. Then for maintenance, use 2-3 times weekly.   Active   fluticasone  (FLONASE ) 50 MCG/ACT nasal spray [id number]  Place 1 spray into both nostrils 2 (two) times daily as needed (nasal congestion). [name], NP  Active   glimepiride  (AMARYL ) 4 MG tablet [id number] Yes Take 2 tablets (8 mg total) by mouth daily before breakfast. [name], MD  Active   hydroquinone  4 % cream [id number]  Apply a thin layer to dark circles around eyes nightly for 2-3 months. Then stop for 1-2 months. If discoloration recurs may restart cycle.   Active    Discontinued 03/24/24 1033 (Change in therapy)   Lancets [facility] ULTRASOFT) lancets [id number]  Use as instructed Gregoria Leas, NP  Active   lidocaine  (LIDODERM ) 5 % [id number]  Place 1  patch onto the skin daily. Remove & Discard patch within 12 hours or as directed by MD Diedra Fowler, MD  Active   [facility] gel [id number]   Nitka, James E, MD  Active   metFORMIN  (GLUCOPHAGE ) 500 MG tablet [id number] Yes Take 2 tablets (1,000 mg total) by mouth 2 (two) times daily with a meal. Motwani, Komal, MD  Active   methimazole  (TAPAZOLE ) 10 MG tablet [id number] Yes Take 2 tablets (20 mg total) by mouth 2 (two) times daily. Motwani, Komal, MD  Active   pantoprazole  (PROTONIX ) 40 MG tablet [id number] Yes Take 1 tablet (40 mg total) by mouth daily. [name], NP  Active   rosuvastatin (CRESTOR) 20 MG tablet [id number] Yes Take 1 tablet (20 mg total) by mouth daily. [name], NP  Active   Salicylic Acid  6 % SHAM [id number]  Apply 1 Application topically daily as needed. Reddick, Johnathan B, NP  Active    Patient not taking:   Discontinued 03/24/24 1019 (Change in therapy)               Assessment/Plan:   Diabetes: - Currently uncontrolled with A1C today 8.5% above goal < 7%, but improved from 9.6% in March 2025. Improvement likely due to improvements in diet, as patient did have to discontinue SGLT2i since last A1C due to frequent yeast infections. Discussed that despite her adhering to a healthy diet, she still requires additional pharmacotherapy to manage diabetes. She is agreeable to transitioning from Januvia  to  GLP-1RA Trulicity for improved glycemic control. Trulicity selected, as this agent has less potent weight loss side effect, and patient is concerned about losing more weight.  - Reviewed long term cardiovascular and renal outcomes of uncontrolled blood sugar - Reviewed goal A1c, goal fasting, and goal 2 hour post prandial glucose - Reviewed dietary modifications including limiting portion sizes of carbohydrates and increasing intake of protein and non-starchy vegetables. Advised patient on methods to avoid GI AE with starting Trulicity. - Reviewed lifestyle  modifications including:  - Recommend to stop Januvia  - Recommend to start Trulicity 0.75 mg weekly. PA submitted (Key: [id number]). Provided patient with initial 2 mo supply to last until our next appointment, then will plan to increase to 1.5 mg once weekly. - Recommend to continue metformin  IR 1000 mg BID  - Recommend to continue glimepiride  8 mg daily with breakfast - Patient denies personal or family history of multiple endocrine neoplasia type 2, medullary thyroid  cancer; personal history of pancreatitis or gallbladder disease. Patient does have hyperthyroidism, managed my endocrinology, but confirmed no hx of thyroid  cancer.  - Recommend to  check glucose twice daily - FBG and 2-hr PPG. Encouraged patient to bring glucometer to follow-up appointment. - Next A1C due Sept 2025   Hypertension: - Currently uncontrolled with clinic BP 149/55 mmHg above goal < 130/80 mmHg. Patient is taking amlodipine  10 mg daily, though she is unsure whether she took her dose this morning. She is not willing to start additional BP medication today. Uncontrolled hyperthryroidism is likely contributing to elevated BP. She is agreeable to starting to monitor BP at home. She was instructed to bring a log to her next appt. If home BP is elevated, we will add ARB or thiazide.  - Reviewed long term cardiovascular and renal outcomes of uncontrolled blood pressure - Reviewed appropriate blood pressure monitoring technique and reviewed goal blood pressure. Recommended to check home blood pressure and heart rate once daily and bring log to next appointment. - Recommend to continue amlodipine  10 mg daily - Patient was advised to increase methimazole  to 20 mg BID as previously instructed by Dr. Vertell Gory. - Placed order for BP monitor for mail order from [facility] per protocol     Hyperlipidemia/ASCVD Risk Reduction: - Currently uncontrolled with last LDL-C of 132 mg/dL above goal < 70 mg/dL with G6YQ and 10 year ASCVD  risk score > 20%. Lack of control likely due to nonadherence. However, would prefer for patient to be on high intensity statin. She is agreeable to switch from simvastatin  to rosuvastatin. Emphasized importance of adherence. - Reviewed long term complications of uncontrolled cholesterol - Recommend to stop simvastatin  - Recommend to start rosuvastatin 20 mg daily  - Repeat lipid panel in 6-12 weeks   Advised patient to start using a pill box to confirm daily adherence.   Follow Up Plan: Pharmacy in-person 05/05/24, Endo 06/01/24, PCP 06/04/24  Arthea Larsson, PharmD PGY1 Pharmacy Resident

## 2024-03-25 ENCOUNTER — Other Ambulatory Visit (HOSPITAL_COMMUNITY): Payer: Self-pay

## 2024-03-25 ENCOUNTER — Other Ambulatory Visit: Payer: Self-pay

## 2024-04-02 ENCOUNTER — Other Ambulatory Visit (HOSPITAL_COMMUNITY): Payer: Self-pay

## 2024-04-21 ENCOUNTER — Telehealth: Payer: Self-pay | Admitting: Nurse Practitioner

## 2024-04-21 NOTE — Telephone Encounter (Signed)
 Patient was identified as falling into the [place] Measure - Diabetes.   Patient was: Referred to pharmacy for chronic disease management.

## 2024-04-24 ENCOUNTER — Other Ambulatory Visit (HOSPITAL_COMMUNITY): Payer: Self-pay

## 2024-05-05 ENCOUNTER — Other Ambulatory Visit (HOSPITAL_COMMUNITY): Payer: Self-pay

## 2024-05-05 ENCOUNTER — Ambulatory Visit: Payer: Self-pay

## 2024-05-05 ENCOUNTER — Telehealth: Payer: Self-pay

## 2024-05-05 DIAGNOSIS — E119 Type 2 diabetes mellitus without complications: Secondary | ICD-10-CM

## 2024-05-05 MED ORDER — TRULICITY 0.75 MG/0.5ML ~~LOC~~ SOAJ
0.7500 mg | SUBCUTANEOUS | 0 refills | Status: DC
  Filled 2024-05-05: qty 4, 56d supply, fill #0
  Filled 2024-06-15: qty 2, 28d supply, fill #0

## 2024-05-05 NOTE — Progress Notes (Deleted)
 05/05/2024 Name: Crystal [name] MRN: [id number] DOB: [birth year]  No chief complaint on file.   Crystal [name] is a 63 y.o. year old female who was referred for medication management by their primary care provider, Crystal Bascom RAMAN, NP. They presented for a face to face visit today.  PMH includes HTN, T2DM, hyperthyroidism, HLD.    They were referred to the pharmacist by their PCP for assistance in managing diabetes, hypertension, and hyperlipidemia   Subjective: Patient was last seen by PCP Bascom Crystal on 03/04/24. BP was uncontrolled at 145/63 mmHg in the setting of uncontrolled hyperthyroidism (HR 95 bpm). Patient presented at that appt with s/sx of a UTI. She had not stopped Jardiance  as previously instructed by endocrinology, and these instructions were reiterated. She was also given an Rx for fluconazole  150 mg x1 dose. She did see the nutritionist on 02/06/24 to discuss dietary interventions for diabetes.  Patient was last seen by pharmacy in-person on 03/24/24. Her A1C had improved from 9.6% to 9.5%. She was instructed to stop Januvia , start Trulicity , and continue glimepiride  and metformin . She was encouraged to increase methimazole  to 20 mg BID as previously instructed by endocrinology, given her elevated BP despite adherence to amlodipine  10 mg daily. She was also transitioned from simvastatin  to rosuvastatin  for ASCVD primary prevention.   Today, ***  Trulicity  tolerability? Pill box? Adherence?   Care Team: Primary Care Provider: Oley Bascom RAMAN, NP ; Next Scheduled Visit: 06/04/24 Endocrinologist Crystal Birmingham, MD; Next Scheduled Visit: 06/01/24  Medication Access/Adherence  Current Pharmacy:  [facility] [address] [place] [address] Phone: [phone] Fax: [phone]  [facility] [address] [place] [address] Phone: [phone] Fax:  [phone]  [facility] [address] [place] - [address] [place] [address] Phone: [phone] Fax: [phone]  [facility] [address] [place] [address] Phone: [phone] Fax: [phone]  [facility] - [place], [place] - [address] [place] [address] Phone: [phone] Fax: [phone]   Patient reports affordability concerns with their medications: No  - [facility] (pick-up). Wants 3 mo supplies when possible. Patient reports access/transportation concerns to their pharmacy: No  Patient reports adherence concerns with their medications:  Yes  - not taking simvastatin  consistently. Reports that she often forgets whether she has taken her medications that day. She is not currently using a pill box.   Fill history is appropriate  Diabetes:  Current medications: metformin  IR 1000 mg BID with meals, glimepiride  8 mg daily before breakfast, Januvia  100 mg daily,  Medications tried in the past: Jardiance  (frequent yeast infections while A1C > 8%)  Current glucose readings:  Using unknown glucometer; testing two times daily - she does not have glucometer or log with her today. She does not give specific numbers but states that PPG are always < 180 mg/dL but FBG are elevated.  Patient denies hypoglycemic s/sx including dizziness, shakiness, sweating. Patient denies hyperglycemic symptoms including polyuria, polydipsia, polyphagia, nocturia, neuropathy, blurred vision.  Current meal patterns: Reports that she has made significant changes in her diet. Avoiding sugary foods, bread, rice. She has lost 10 lbs in 3 months. She tries not to eat after 6 PM  Current physical activity: She is walking for one hour each evening   Current medication access support: none-  Medicaid insurance  Hypertension:    Current medications: amlodipine  10 mg daily Medications previously tried: atenolol   She is unsure whether she took her amlodipine  this morning.  Patient does not have a validated, automated, upper arm home BP cuff Current blood pressure readings readings: N/A  Patient denies hypotensive s/sx including dizziness, lightheadedness.  Patient denies hypertensive symptoms including headache, chest pain, shortness of breath   Hyperlipidemia/ASCVD Risk Reduction  Current lipid lowering medications: simvastatin  20 mg daily Medications tried in the past: none  Patient does not take this medication regularly. States that she read cholesterol medication is no good. She does not express specific side effects she is concerned about.   Antiplatelet regimen: none  Clinical ASCVD: No  The 10-year ASCVD risk score (Arnett DK, et al., 2019) is: 26.1%   Values used to calculate the score:     Age: 72 years     Clincally relevant sex: Female     Is Non-Hispanic African American: Yes     Diabetic: Yes     Tobacco smoker: No     Systolic Blood Pressure: 149 mmHg     Is BP treated: Yes     HDL Cholesterol: 55 mg/dL     Total Cholesterol: 208 mg/dL    Objective:  BP Readings from Last 3 Encounters:  03/24/24 (!) 149/55  03/04/24 (!) 145/63  03/02/24 (!) 160/80   Lab Results  Component Value Date   HGBA1C 8.5 (A) 03/24/2024   HGBA1C 9.6 (H) 12/15/2023   HGBA1C 8.4 (A) 10/14/2023    Lab Results  Component Value Date   CREATININE 0.31 (L) 12/15/2023   BUN 12 12/15/2023   NA 139 12/15/2023   K 4.6 12/15/2023   CL 102 12/15/2023   CO2 26 12/15/2023    Lab Results  Component Value Date   CHOL 208 (H) 12/15/2023   HDL 55 12/15/2023   LDLCALC 132 (H) 12/15/2023   TRIG 106 12/15/2023   CHOLHDL 3.8 12/15/2023    Medications Reviewed Today   Medications were not reviewed in this encounter       Assessment/Plan:   Diabetes: - Currently uncontrolled with A1C today 8.5%  above goal < 7%, but improved from 9.6% in March 2025. Improvement likely due to improvements in diet, as patient did have to discontinue SGLT2i since last A1C due to frequent yeast infections. Discussed that despite her adhering to a healthy diet, she still requires additional pharmacotherapy to manage diabetes. She is agreeable to transitioning from Januvia  to  GLP-1RA Trulicity  for improved glycemic control. Trulicity  selected, as this agent has less potent weight loss side effect, and patient is concerned about losing more weight.  - Reviewed long term cardiovascular and renal outcomes of uncontrolled blood sugar - Reviewed goal A1c, goal fasting, and goal 2 hour post prandial glucose - Reviewed dietary modifications including limiting portion sizes of carbohydrates and increasing intake of protein and non-starchy vegetables. Advised patient on methods to avoid GI AE with starting Trulicity . - Reviewed lifestyle modifications including:  - Recommend to stop Januvia  - Recommend to start Trulicity  0.75 mg weekly. PA submitted (Key: [id number]). Provided patient with initial 2 mo supply to last until our next appointment, then will plan to increase to 1.5 mg once weekly. - Recommend to continue metformin  IR 1000 mg BID  - Recommend to continue glimepiride  8 mg daily with breakfast - Patient denies personal or family history of multiple endocrine neoplasia type 2, medullary thyroid  cancer; personal history of pancreatitis or  gallbladder disease. Patient does have hyperthyroidism, managed my endocrinology, but confirmed no hx of thyroid  cancer.  - Recommend to check glucose twice daily - FBG and 2-hr PPG. Encouraged patient to bring glucometer to follow-up appointment. - Next A1C due Sept 2025   Hypertension: - Currently uncontrolled with clinic BP 149/55 mmHg above goal < 130/80 mmHg. Patient is taking amlodipine  10 mg daily, though she is unsure whether she took her dose this morning. She is not  willing to start additional BP medication today. Uncontrolled hyperthryroidism is likely contributing to elevated BP. She is agreeable to starting to monitor BP at home. She was instructed to bring a log to her next appt. If home BP is elevated, we will add ARB or thiazide.  - Reviewed long term cardiovascular and renal outcomes of uncontrolled blood pressure - Reviewed appropriate blood pressure monitoring technique and reviewed goal blood pressure. Recommended to check home blood pressure and heart rate once daily and bring log to next appointment. - Recommend to continue amlodipine  10 mg daily - Patient was advised to increase methimazole  to 20 mg BID as previously instructed by Dr. Dartha. - Placed order for BP monitor for mail order from [facility] per protocol     Hyperlipidemia/ASCVD Risk Reduction: - Currently uncontrolled with last LDL-C of 132 mg/dL above goal < 70 mg/dL with U7IF and 10 year ASCVD risk score > 20%. Lack of control likely due to nonadherence. However, would prefer for patient to be on high intensity statin. She is agreeable to switch from simvastatin  to rosuvastatin . Emphasized importance of adherence. - Reviewed long term complications of uncontrolled cholesterol - Recommend to stop simvastatin  - Recommend to start rosuvastatin  20 mg daily  - Repeat lipid panel in 6-12 weeks   Advised patient to start using a pill box to confirm daily adherence.   Follow Up Plan: Pharmacy in-person ***, Endo 06/01/24, PCP 06/04/24    Lorain Baseman, PharmD [facility] [phone]

## 2024-05-05 NOTE — Telephone Encounter (Signed)
 Attempted to contact patient for scheduled appointment for medication management. VM not set up. Will reach out via MyChart and attempt to reschedule. Appears that patient did pick up the medications that were initiated at previous pharmacy telephone appt.  Will provide refill on Trulicity  0.75 mg, in case I am not able to reach her prior to next fill and have not been able to evaluate tolerability to increase to 1.5 mg weekly.  Lorain Baseman, PharmD [facility] [phone]

## 2024-05-06 ENCOUNTER — Telehealth: Payer: Self-pay | Admitting: *Deleted

## 2024-05-06 NOTE — Progress Notes (Signed)
 Complex Care Management Care Guide Note  05/06/2024 Name: Crystal [name] Dpiiph MRN: [id number] DOB: 1961/09/04  Crystal [name] is a 63 y.o. year old female who is a primary care patient of Oley Bascom RAMAN, NP and is actively engaged with the care management team. I reached out to [employer] by phone today to assist with re-scheduling  with the Pharmacist.  Follow up plan: Unsuccessful telephone outreach attempt made.  Harlene Satterfield  [facility]  Value-Based Care Institute, [facility] Guide  Direct Dial: [phone]  Fax [phone]

## 2024-05-11 NOTE — Progress Notes (Signed)
 Complex Care Management Care Guide Note  05/10/24 Late entry  Name: Crystal [name] Dpiiph MRN: [id number] DOB: 28-May-1961  Crystal [name] is a 63 y.o. year old female who is a primary care patient of Oley Bascom RAMAN, NP and is actively engaged with the care management team. I reached out to [employer] by phone today to assist with re-scheduling  with the Pharmacist.  Follow up plan: Unsuccessful telephone outreach attempt made. No further outreach attempts will be made at this time.   Harlene Satterfield  [facility]  Value-Based Care Institute, [facility] Guide  Direct Dial: [phone]  Fax [phone]

## 2024-05-11 NOTE — Progress Notes (Signed)
 Complex Care Management Care Guide Note  05/11/2024 Name: Danijela Vessey Dpiiph MRN: [id number] DOB: [birth year]  Crystal [name] is a 63 y.o. year old female who is a primary care patient of Oley Bascom RAMAN, NP and is actively engaged with the care management team. I reached out to [employer] by phone today to assist with re-scheduling  with the Pharmacist.  Follow up plan: Unsuccessful telephone outreach attempt made. No further outreach attempts will be made at this time. We have been unable to contact the patient to reschedule for complex care management services.  Harlene Satterfield  [facility]  Value-Based Care Institute, [facility] Guide  Direct Dial: [phone]  Fax [phone]

## 2024-05-28 ENCOUNTER — Other Ambulatory Visit

## 2024-06-01 ENCOUNTER — Ambulatory Visit: Admitting: "Endocrinology

## 2024-06-04 ENCOUNTER — Ambulatory Visit: Payer: Self-pay | Admitting: Nurse Practitioner

## 2024-06-15 ENCOUNTER — Other Ambulatory Visit (HOSPITAL_COMMUNITY): Payer: Self-pay

## 2024-06-23 ENCOUNTER — Other Ambulatory Visit (HOSPITAL_COMMUNITY): Payer: Self-pay

## 2024-06-23 ENCOUNTER — Ambulatory Visit (INDEPENDENT_AMBULATORY_CARE_PROVIDER_SITE_OTHER)

## 2024-06-23 ENCOUNTER — Ambulatory Visit (HOSPITAL_COMMUNITY)
Admission: EM | Admit: 2024-06-23 | Discharge: 2024-06-23 | Disposition: A | Discharge status: Home / Self Care | Attending: Physician Assistant | Admitting: Physician Assistant

## 2024-06-23 ENCOUNTER — Other Ambulatory Visit: Payer: Self-pay

## 2024-06-23 ENCOUNTER — Encounter (HOSPITAL_COMMUNITY): Payer: Self-pay | Admitting: *Deleted

## 2024-06-23 DIAGNOSIS — M25561 Pain in right knee: Secondary | ICD-10-CM | POA: Diagnosis not present

## 2024-06-23 MED ORDER — DICLOFENAC SODIUM 1 % EX GEL
2.0000 g | Freq: Four times a day (QID) | CUTANEOUS | 0 refills | Status: AC
  Filled 2024-06-23: qty 200, 25d supply, fill #0
  Filled 2024-06-23: qty 200, 12d supply, fill #0

## 2024-06-23 NOTE — ED Provider Notes (Signed)
 [facility]    CSN: [id number] Arrival date & time: 06/23/24  1452      History   Chief Complaint Chief Complaint  Patient presents with   Knee Pain    HPI Crystal [name] is a 63 y.o. female.   Patient presents today with a 10-day history of recurrent right knee pain.  She denies any known injury or increase in activity prior to symptom onset.  She reports that the pain has gradually been worsening in the past few days as woken her up from sleep.  Pain is rated 9 on a 0-10 pain scale, described as sharp, worse with certain movements, no alleviating factors identified.  She has been taking Tylenol  without improvement of symptoms.  She denies formal diagnosis of osteoarthritis but has required intra-articular injections in the past to manage her pain.  She is unable to take NSAIDs due to severe GERD.  She denies history of rheumatoid arthritis or gout.  Denies any associated erythema, heat in the joint, fever, nausea, vomiting.  She has never had surgery on this knee.    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   Type 2 diabetes mellitus with hyperglycemia, without long-term current use of insulin  (HCC) 12/17/2019   Dyslipidemia 11/19/2019   Spinal stenosis of cervical region 10/11/2016    Class: Chronic   Cervical spinal stenosis 10/11/2016   Hypertension 09/09/2016   Diabetes mellitus (HCC) 09/09/2016   Multiple thyroid  nodules 01/27/2013   Disorder of thyroid  07/07/2007   VARICOSE VEIN 07/07/2007   GERD 07/07/2007     Past Surgical History:  Procedure Laterality Date   ANTERIOR CERVICAL DECOMP/DISCECTOMY FUSION N/A 10/11/2016   Procedure: ANTERIOR CERVICAL DISCECTOMY FUSION C6-7 WITH EXCISION OF OSTEOPHYTE;  Surgeon: Lynwood FORBES Better, MD;  Location: [facility] OR;  Service: Orthopedics;  Laterality: N/A;   CESAREAN SECTION     TUBAL LIGATION      OB History   No obstetric history on file.      Home Medications    Prior to Admission medications   Medication Sig Start Date End Date Taking? Authorizing Provider  amLODipine  (NORVASC ) 10 MG tablet Take 1 tablet (10 mg total) by mouth daily. 02/12/24  Yes Oley Bascom RAMAN, NP  cetirizine  (ZYRTEC  ALLERGY) 10 MG tablet Take 1 tablet (10 mg total) by mouth at bedtime. 10/14/23  Yes Oley Bascom RAMAN, NP  diclofenac  Sodium (VOLTAREN  ARTHRITIS PAIN) 1 % GEL Apply 2 g topically 4 (four) times daily. 06/23/24  Yes Joanthony Hamza K, PA-C  glimepiride  (AMARYL ) 4 MG tablet Take 2 tablets (8 mg total) by mouth daily before breakfast. 02/04/24 02/03/25 Yes Motwani, Komal, MD  metFORMIN  (GLUCOPHAGE ) 500 MG tablet Take 2 tablets (1,000 mg total) by mouth 2 (two) times daily with a meal. 02/04/24 02/03/25 Yes Motwani, Komal, MD  acetaminophen  (TYLENOL ) 500 MG tablet Take 1,000 mg by mouth daily as needed for moderate pain.     [provider]  blood glucose meter kit and supplies One Touch Verio. Use up to  four times daily as directed. (FOR ICD-10 E10.9, E11.9). 02/11/20   Myrna Camelia HERO, NP  Blood Pressure Monitor DEVI Use as directed to monitor blood pressure once daily. Keep a log of your blood pressure readings. 03/24/24   Oley Bascom RAMAN, NP  Ciclopirox  1 % shampoo Apply to affected area at bedtime. 08/08/23   Oley Bascom RAMAN, NP  fluocinonide  ointment (LIDEX ) 0.05 % Apply daily to scalp once daily for 2 weeks. Then for maintenance, use 2-3 times weekly. 11/06/23     fluticasone  (FLONASE ) 50 MCG/ACT nasal spray Place 1 spray into both nostrils 2 (two) times daily as needed (nasal  congestion). 10/14/23   Oley Bascom RAMAN, NP  hydroquinone  4 % cream Apply a thin layer to dark circles around eyes nightly for 2-3 months. Then stop for 1-2 months. If discoloration recurs may restart cycle. 11/06/23     Lancets (ONETOUCH ULTRASOFT) lancets Use as instructed 02/11/20   Myrna Camelia HERO, NP  lidocaine  (LIDODERM ) 5 % Place 1 patch onto the skin daily. Remove & Discard patch within 12 hours or as directed by MD 02/24/23   Georgina Ozell LABOR, MD  pantoprazole  (PROTONIX ) 40 MG tablet Take 1 tablet (40 mg total) by mouth daily. 10/14/23   Oley Bascom RAMAN, NP  Salicylic Acid  6 % SHAM Apply 1 Application topically daily as needed. 12/17/23   Aurea Ethel NOVAK, NP    Family History Family History  Problem Relation Age of Onset   Colon cancer Neg Hx    Colon polyps Neg Hx    Esophageal cancer Neg Hx    Rectal cancer Neg Hx    Stomach cancer Neg Hx     Social History Social History   Tobacco Use   Smoking status: Never   Smokeless tobacco: Never  Vaping Use   Vaping status: Never Used  Substance Use Topics   Alcohol use: No   Drug use: No     Allergies   Aspirin and Tramadol    Review of Systems Review of Systems  Constitutional:  Positive for activity change. Negative for appetite change, fatigue and fever.  Respiratory:  Negative for shortness of breath.   Cardiovascular:  Negative for chest pain and leg swelling.  Gastrointestinal:  Negative for abdominal pain, diarrhea, nausea and vomiting.  Musculoskeletal:  Positive for arthralgias, gait problem and joint swelling. Negative for myalgias.  Skin:  Negative for color change and wound.  Neurological:  Negative for weakness and numbness.     Physical Exam Triage Vital Signs ED Triage Vitals  Encounter Vitals Group     BP 06/23/24 1636 (!) 152/76     Girls Systolic BP Percentile --      Girls Diastolic BP Percentile --      Boys Systolic BP Percentile --      Boys Diastolic BP Percentile --      Pulse Rate  06/23/24 1636 99     Resp 06/23/24 1636 16     Temp 06/23/24 1636 98.6 F (37 C)     Temp Source 06/23/24 1636 Oral     SpO2 06/23/24 1636 97 %     Weight --      Height --      Head Circumference --      Peak Flow --      Pain Score 06/23/24 1638 9     Pain Loc --      Pain Education --      Exclude from Growth Chart --  No data found.  Updated Vital Signs BP (!) 152/76   Pulse 99   Temp 98.6 F (37 C) (Oral)   Resp 16   LMP 06/02/2013   SpO2 97%   Visual Acuity Right Eye Distance:   Left Eye Distance:   Bilateral Distance:    Right Eye Near:   Left Eye Near:    Bilateral Near:     Physical Exam Vitals reviewed.  Constitutional:      General: She is awake. She is not in acute distress.    Appearance: Normal appearance. She is well-developed. She is not ill-appearing.     Comments: Very pleasant female appears stated age in no acute distress sitting comfortably in exam room  HENT:     Head: Normocephalic and atraumatic.  Cardiovascular:     Rate and Rhythm: Normal rate and regular rhythm.     Pulses:          Posterior tibial pulses are 2+ on the right side and 2+ on the left side.     Heart sounds: Normal heart sounds, S1 normal and S2 normal. No murmur heard. Pulmonary:     Effort: Pulmonary effort is normal.     Breath sounds: Normal breath sounds. No wheezing, rhonchi or rales.     Comments: Clear to auscultation bilaterally Abdominal:     Palpations: Abdomen is soft.     Tenderness: There is no abdominal tenderness.  Musculoskeletal:     Right knee: No swelling. Decreased range of motion. Tenderness present over the medial joint line. No lateral joint line tenderness. No LCL laxity, MCL laxity, ACL laxity or PCL laxity.     Instability Tests: Anterior drawer test negative. Posterior drawer test negative. Medial McMurray test negative and lateral McMurray test negative.     Right lower leg: No edema.     Left lower leg: No edema.     Comments: Right  knee: Decreased range of motion with flexion and extension secondary to pain.  Palpation over medial joint line without deformity.  Normal gait.  No ligamentous laxity on exam.  Negative McMurray.Foot is neurovascularly intact.  Psychiatric:        Behavior: Behavior is cooperative.      UC Treatments / Results  Labs (all labs ordered are listed, but only abnormal results are displayed) Labs Reviewed - No data to display  EKG   Radiology DG Knee AP/LAT W/Sunrise Right Result Date: 06/23/2024 CLINICAL DATA:  right knee pain Pt declines interpreter. C/O right knee pain and describes popping noise x 10 days. Denies injury. C/O pain worse with leg extension. Pt states she has general malaise EXAM: RIGHT KNEE 3 VIEWS COMPARISON:  None Available. FINDINGS: No evidence of fracture, dislocation, or joint effusion. No evidence of severe arthropathy or other focal bone abnormality. Soft tissues are unremarkable. IMPRESSION: No acute displaced fracture or dislocation. Electronically Signed   By: Morgane  Naveau M.D.   On: 06/23/2024 17:22    Procedures Procedures (including critical care time)  Medications Ordered in UC Medications - No data to display  Initial Impression / Assessment and Plan / UC Course  I have reviewed the triage vital signs and the nursing notes.  Pertinent labs & imaging results that were available during my care of the patient were reviewed by me and considered in my medical decision making (see chart for details).     Patient is well-appearing, afebrile, nontoxic, nontachycardic.  Low suspicion for septic arthritis given she does not have  a fever and has no significant erythema, swelling, warmth to touch.  Suspect osteoarthritis as etiology of symptoms though given her focal tenderness over medial joint line it is also possible that she has a meniscal injury despite negative McMurray in clinic.  Recommended conservative treatment measures including brace, elevation,  avoiding strenuous activity.  She is unable to take oral NSAIDs due to history of GI side effects and so was given topical Voltaren  to help manage her symptoms.  No indication for dose adjustment based on her metabolic panel from 12/15/2023 with creatinine of 0.31.  She is to continue her Tylenol  to help with pain relief.  She reports having improvement of symptoms with intra-articular injections we discussed that this is not something that we typically do in urgent care.  She was given the contact information for orthopedics and encouraged to follow-up if her symptoms are improving quickly for additional evaluation and management.  Strict return precautions given.  All questions answered to patient's satisfaction.  Final Clinical Impressions(s) / UC Diagnoses   Final diagnoses:  Acute pain of right knee     Discharge Instructions      Your x-ray did not show any evidence of arthritis or anything that is broken or out of place.  Keep your leg elevated and use brace for comfort and support.  Apply diclofenac  up to 4 times a day to help with pain and inflammation.  Continue with your Tylenol .  If your symptoms are not improving within a week please follow-up with orthopedics; call to schedule an appointment.  If anything worsens you have increasing pain, redness, swelling, fever, difficulty walking, instability in the leg need to be seen immediately.     ED Prescriptions     Medication Sig Dispense Auth. Provider   diclofenac  Sodium (VOLTAREN  ARTHRITIS PAIN) 1 % GEL Apply 2 g topically 4 (four) times daily. 150 g Tab Rylee K, PA-C      PDMP not reviewed this encounter.   Sherrell Rocky POUR, PA-C 06/23/24 1746

## 2024-06-23 NOTE — ED Triage Notes (Signed)
 Pt declines interpreter. C/O right knee pain and describes popping noise x 10 days. Denies injury. C/O pain worse with leg extension. Pt states she has general malaise. Has been taking Tylenol  with some relief, but then pain returns.

## 2024-06-23 NOTE — Discharge Instructions (Signed)
 Your x-ray did not show any evidence of arthritis or anything that is broken or out of place.  Keep your leg elevated and use brace for comfort and support.  Apply diclofenac  up to 4 times a day to help with pain and inflammation.  Continue with your Tylenol .  If your symptoms are not improving within a week please follow-up with orthopedics; call to schedule an appointment.  If anything worsens you have increasing pain, redness, swelling, fever, difficulty walking, instability in the leg need to be seen immediately.

## 2024-07-21 ENCOUNTER — Encounter: Payer: Self-pay | Admitting: Physician Assistant

## 2024-07-21 ENCOUNTER — Ambulatory Visit: Admitting: Physician Assistant

## 2024-07-21 DIAGNOSIS — M25562 Pain in left knee: Secondary | ICD-10-CM | POA: Diagnosis not present

## 2024-07-21 DIAGNOSIS — M25561 Pain in right knee: Secondary | ICD-10-CM | POA: Diagnosis not present

## 2024-07-21 DIAGNOSIS — G8929 Other chronic pain: Secondary | ICD-10-CM

## 2024-07-21 NOTE — Progress Notes (Signed)
 Office Visit Note   Patient: Crystal [name]           Date of Birth: Jun 26, 1961           MRN: [id number] Visit Date: 07/21/2024              Requested by: Oley Bascom RAMAN, NP [phone] [place],  [place] [address] PCP: Oley Bascom RAMAN, NP   Assessment & Plan: Visit Diagnoses:  1. Chronic pain of left knee   2. Chronic pain of right knee     Plan: Patient is a pleasant 63 year old woman who comes in today with a chief complaint of right greater than left knee pain this has been going on about 3 weeks she denies any injury she said the pain is across the front of her knees and when she squats she notices cracking and going on in her knee.  She has treated this topically with anti-inflammatory cream.  She says she actually fell yesterday.  Denies any hip pain denies any thigh pain.  Occasionally the pain from her knee shoots down into her lower leg.  Denies any specific calf pain.  Findings consistent with early patellofemoral arthritis.  I think she would benefit from quadricep strengthening.  Will forward her to physical therapy to learn a program on her own.  We briefly discussed injections but she currently her diabetes is out of control with the lowest A1c 3 months ago being 8.5 but has had A1c's as high as 10.  Could consider that in the future.  Follow-Up Instructions: Return if symptoms worsen or fail to improve.   Orders:  Orders Placed This Encounter  Procedures   Ambulatory referral to Physical Therapy   No orders of the defined types were placed in this encounter.     Procedures: No procedures performed   Clinical Data: No additional findings.   Subjective: No chief complaint on file.   HPI Patient is a 63 year old woman referred for evaluation of right greater than left knee pain.  She denies any history of injury she does take Tylenol  she does not take anti-inflammatories because they bother her stomach.  No injury in the past particularly  painful when she goes up and down stairs or squats. Review of Systems  All other systems reviewed and are negative.    Objective: Vital Signs: LMP 06/02/2013   Physical Exam Constitutional:      Appearance: Normal appearance.  Skin:    General: Skin is warm and dry.  Neurological:     General: No focal deficit present.     Mental Status: She is alert and oriented to person, place, and time.     Ortho Exam Bilateral knees no effusion no erythema compartments are soft and nontender she has a negative Homans' sign bilaterally no tenderness with palpation of the calf.  She does have grinding with the knee and patellofemoral pain.  Good stability distal pulses are intact Specialty Comments:  No specialty comments available.  Imaging: No results found.   PMFS History: Patient Active Problem List   Diagnosis Date Noted   Pruritus 09/26/2021   Other allergic rhinitis 09/26/2021   Possible Temporomandibular joint disorder (TMJ) 09/26/2021   Ear itching 10/23/2020   Hyperthyroidism 12/17/2019   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   Type 2 diabetes mellitus with hyperglycemia, without long-term current use of insulin  (HCC) 12/17/2019   Dyslipidemia 11/19/2019   Spinal stenosis of cervical region 10/11/2016    Class: Chronic  Cervical spinal stenosis 10/11/2016   Hypertension 09/09/2016   Diabetes mellitus (HCC) 09/09/2016   Multiple thyroid  nodules 01/27/2013   Disorder of thyroid  07/07/2007   VARICOSE VEIN 07/07/2007   GERD 07/07/2007   Past Medical History:  Diagnosis Date   Allergy    Diabetes mellitus    GERD (gastroesophageal reflux disease)    Hyperlipidemia    Hypertension    Hyperthyroidism    Recurrent upper respiratory infection (URI)     Family History  Problem Relation Age of Onset   Colon cancer Neg Hx    Colon polyps Neg Hx    Esophageal cancer Neg Hx    Rectal cancer Neg Hx    Stomach cancer Neg Hx     Past Surgical History:  Procedure  Laterality Date   ANTERIOR CERVICAL DECOMP/DISCECTOMY FUSION N/A 10/11/2016   Procedure: ANTERIOR CERVICAL DISCECTOMY FUSION C6-7 WITH EXCISION OF OSTEOPHYTE;  Surgeon: Lynwood FORBES Better, MD;  Location: [facility] OR;  Service: Orthopedics;  Laterality: N/A;   CESAREAN SECTION     TUBAL LIGATION     Social History   Occupational History   Not on file  Tobacco Use   Smoking status: Never   Smokeless tobacco: Never  Vaping Use   Vaping status: Never Used  Substance and Sexual Activity   Alcohol use: No   Drug use: No   Sexual activity: Not on file

## 2024-08-04 ENCOUNTER — Other Ambulatory Visit: Payer: Self-pay | Admitting: "Endocrinology

## 2024-08-04 ENCOUNTER — Other Ambulatory Visit (HOSPITAL_COMMUNITY): Payer: Self-pay

## 2024-08-04 DIAGNOSIS — E1165 Type 2 diabetes mellitus with hyperglycemia: Secondary | ICD-10-CM

## 2024-08-09 ENCOUNTER — Other Ambulatory Visit (HOSPITAL_COMMUNITY): Payer: Self-pay

## 2024-08-09 ENCOUNTER — Encounter: Payer: Self-pay | Admitting: Radiology

## 2024-08-10 ENCOUNTER — Telehealth: Payer: Self-pay | Admitting: "Endocrinology

## 2024-08-10 DIAGNOSIS — E1165 Type 2 diabetes mellitus with hyperglycemia: Secondary | ICD-10-CM

## 2024-08-10 NOTE — Telephone Encounter (Signed)
 MEDICATION: Metformin   PHARMACY:  [facility] at [facility] THE PATIENT CONTACTED THEIR PHARMACY?  Yes  LAST REFILL:  @@LASTREFILL @  IS THIS A 90 DAY SUPPLY : Yes  IS PATIENT OUT OF MEDICATION: Yes  IF NOT; HOW MUCH IS LEFT:   LAST APPOINTMENT DATE: @10 /29/2025  NEXT APPOINTMENT DATE:@11 /02/2024  DO WE HAVE YOUR PERMISSION TO LEAVE A DETAILED MESSAGE?: Yes  OTHER COMMENTS:    **Let patient know to contact pharmacy at the end of the day to make sure medication is ready. **  ** Please notify patient to allow 48-72 hours to process**  **Encourage patient to contact the pharmacy for refills or they can request refills through [facility]**

## 2024-08-11 ENCOUNTER — Other Ambulatory Visit

## 2024-08-11 ENCOUNTER — Other Ambulatory Visit (HOSPITAL_COMMUNITY): Payer: Self-pay

## 2024-08-11 MED ORDER — METFORMIN HCL 500 MG PO TABS
1000.0000 mg | ORAL_TABLET | Freq: Two times a day (BID) | ORAL | 1 refills | Status: AC
  Filled 2024-08-11: qty 360, 90d supply, fill #0

## 2024-08-11 NOTE — Telephone Encounter (Signed)
 Requested Prescriptions   Signed Prescriptions Disp Refills   metFORMIN  (GLUCOPHAGE ) 500 MG tablet 360 tablet 1    Sig: Take 2 tablets (1,000 mg total) by mouth 2 (two) times daily with a meal.    Authorizing Provider: DARTHA ERNST    Ordering User: ARLOA JEOFFREY SAILOR

## 2024-08-12 LAB — T4, FREE: Free T4: 2.5 ng/dL — ABNORMAL HIGH (ref 0.8–1.8)

## 2024-08-12 LAB — TSH: TSH: 0.01 m[IU]/L — ABNORMAL LOW (ref 0.40–4.50)

## 2024-08-12 LAB — T3, FREE: T3, Free: 7.3 pg/mL — ABNORMAL HIGH (ref 2.3–4.2)

## 2024-08-13 ENCOUNTER — Other Ambulatory Visit: Payer: Self-pay | Admitting: "Endocrinology

## 2024-08-13 ENCOUNTER — Other Ambulatory Visit (HOSPITAL_COMMUNITY): Payer: Self-pay

## 2024-08-13 MED ORDER — METHIMAZOLE 10 MG PO TABS
ORAL_TABLET | ORAL | 4 refills | Status: DC
  Filled 2024-08-13 – 2024-09-08 (×3): qty 150, 30d supply, fill #0

## 2024-08-18 ENCOUNTER — Ambulatory Visit (INDEPENDENT_AMBULATORY_CARE_PROVIDER_SITE_OTHER): Admitting: "Endocrinology

## 2024-08-18 ENCOUNTER — Other Ambulatory Visit: Payer: Self-pay

## 2024-08-18 ENCOUNTER — Other Ambulatory Visit

## 2024-08-18 VITALS — BP 120/70 | HR 84 | Ht 65.0 in | Wt 143.0 lb

## 2024-08-18 DIAGNOSIS — E0500 Thyrotoxicosis with diffuse goiter without thyrotoxic crisis or storm: Secondary | ICD-10-CM

## 2024-08-18 DIAGNOSIS — E7800 Pure hypercholesterolemia, unspecified: Secondary | ICD-10-CM

## 2024-08-18 DIAGNOSIS — E1165 Type 2 diabetes mellitus with hyperglycemia: Secondary | ICD-10-CM | POA: Diagnosis not present

## 2024-08-18 DIAGNOSIS — Z7984 Long term (current) use of oral hypoglycemic drugs: Secondary | ICD-10-CM | POA: Diagnosis not present

## 2024-08-18 NOTE — Progress Notes (Signed)
 Outpatient Endocrinology Note Crystal Birmingham, MD    Crescentia Boutwell Shiflett [birth year] [id number]  Referring Provider: Oley Bascom RAMAN, NP Primary Care Provider: Oley Bascom RAMAN, NP Reason for consultation: Subjective   Assessment & Plan  Diagnoses and all orders for this visit:  Uncontrolled type 2 diabetes mellitus with hyperglycemia (HCC) -     POCT glycosylated hemoglobin (Hb A1C) -     Ambulatory referral to General Surgery -     Hemoglobin A1c  Graves disease -     Ambulatory referral to General Surgery  Long term (current) use of oral hypoglycemic drugs  Pure hypercholesterolemia   History of hyperthyroidism, was prescribed methimazole  10 mg but patient did not start 12/2023 TSI -ve, TRAb + On methimazole  20 mg tid, denies any side effects; recommended to increase it to 20 mg in the morning, 10 mg in the afternoon and 20 mg in the evening 08/18/24: Ordered referral for total thyroidectomy given history of thyroid  nodules and uncontrolled Graves' disease on high dose of methimazole  per patient preference, patient is not interested in radioactive iodine Ordered repeat labs today  Prominent goiter, especially on the right side noted S/p L thyroidectomy in 1993   Patient reports that she was was previously recommended thyroidectomy but could not afford it 12/2023 thyroid  ultrasound: per report: Stable size of right superior and inferior thyroid  nodules. These nodules are stable since the prior sonogram 4 years ago. Both nodules have been previously biopsied with negative cytology. Status post left thyroidectomy. Confirmed that patient had FNA in 2014 per out records on right sided nodules Continue to monitor clinically    Type 2 diabetes with hyperglycemia 12/2023 C-peptide positive, MA/Cr negative No blood sugars shared today Currently on metformin  500 mg 2 pills twice daily and Jardiance  25 mg  Her PCP stopped Januvia  100 mg every day to attempt GLP-1? but patient  did not like it, so she is not taking that shot due to GI side effects Previously, discussed with patient the need to start insulin  given current A1c of 9.6 but patient is absolutely against insulin  Previously, ordered diabetes education Previously on glimepiride  4 mg 2 pills and januvia  100mg  qd Patient wants to try lifestyle changes before she would consider insulin   12/2023 Hypercholesterolemia with LDL of 132 Patient is on ?simvastatin  20 mg daily with elevated LFTs in the past, currently normal Will increase the dose once patient is settled on diabetes medications   Return in about 8 weeks (around 10/13/2024) for visit, labs today.   I have reviewed current medications, nurse's notes, allergies, vital signs, past medical and surgical history, family medical history, and social history for this encounter. Counseled patient on symptoms, examination findings, lab findings, imaging results, treatment decisions and monitoring and prognosis. The patient understood the recommendations and agrees with the treatment plan. All questions regarding treatment plan were fully answered.  Crystal Birmingham, MD  08/18/24   History of Present Illness HPI  Crystal [name] is a 63 y.o. year old female who presents for evaluation of diabetes type II and hyperthyroidism.  Reports mild weight loss, hair fall is stable  Denies cold/heat intolerance Denies hyperdefecation/palpitations No dysphagia/dyspnea/dysphonia   Adverse Drug Effects from Methimazole  (MMI): rash No fever No throat pain No arthritis No mouth ulcers No jaundice No loss of appetite No lymphadenopathy No  Diagnosed on DM2 around 2013 Denies MI/stroke, neuropathy, retinopathy, nephropathy Did not bring meter, checking tid  Bg 130-185 per recall On metformin  500 mg 2 pills  bid Januvia  100 mg every day Jardiance  25 mg every day Stopped glimepiride  4 mg 2 pills in morning Simvastatin  20 mg every day   Physical Exam  BP  120/70   Pulse 84   Ht 5' 5 (1.651 m)   Wt 143 lb (64.9 kg)   LMP 06/02/2013   SpO2 96%   BMI 23.80 kg/m    Constitutional: well developed, well nourished Head: normocephalic, atraumatic Eyes: sclera anicteric, no redness Neck: supple Lungs: normal respiratory effort Neurology: alert and oriented Skin: dry, no appreciable rashes Musculoskeletal: no appreciable defects Psychiatric: normal mood and affect   Current Medications Patient's Medications  New Prescriptions   No medications on file  Previous Medications   ACETAMINOPHEN  (TYLENOL ) 500 MG TABLET    Take 1,000 mg by mouth daily as needed for moderate pain.    AMLODIPINE  (NORVASC ) 10 MG TABLET    Take 1 tablet (10 mg total) by mouth daily.   BLOOD GLUCOSE METER KIT AND SUPPLIES    One Touch Verio. Use up to four times daily as directed. (FOR ICD-10 E10.9, E11.9).   BLOOD PRESSURE MONITOR DEVI    Use as directed to monitor blood pressure once daily. Keep a log of your blood pressure readings.   CETIRIZINE  (ZYRTEC  ALLERGY) 10 MG TABLET    Take 1 tablet (10 mg total) by mouth at bedtime.   CICLOPIROX  1 % SHAMPOO    Apply to affected area at bedtime.   DICLOFENAC  SODIUM (VOLTAREN  ARTHRITIS PAIN) 1 % GEL    Apply 2 grams topically 4 (four) times daily.   FLUOCINONIDE  OINTMENT (LIDEX ) 0.05 %    Apply daily to scalp once daily for 2 weeks. Then for maintenance, use 2-3 times weekly.   FLUTICASONE  (FLONASE ) 50 MCG/ACT NASAL SPRAY    Place 1 spray into both nostrils 2 (two) times daily as needed (nasal congestion).   GLIMEPIRIDE  (AMARYL ) 4 MG TABLET    Take 2 tablets (8 mg total) by mouth daily before breakfast.   HYDROQUINONE  4 % CREAM    Apply a thin layer to dark circles around eyes nightly for 2-3 months. Then stop for 1-2 months. If discoloration recurs may restart cycle.   LANCETS (ONETOUCH ULTRASOFT) LANCETS    Use as instructed   LIDOCAINE  (LIDODERM ) 5 %    Place 1 patch onto the skin daily. Remove & Discard patch within 12  hours or as directed by MD   METFORMIN  (GLUCOPHAGE ) 500 MG TABLET    Take 2 tablets (1,000 mg total) by mouth 2 (two) times daily with a meal.   METHIMAZOLE  (TAPAZOLE ) 10 MG TABLET    Take 2 tablets in the morning THEN 1 tablet in the afternoon and 2 tablets at night.  Total of 5 tablets daily   PANTOPRAZOLE  (PROTONIX ) 40 MG TABLET    Take 1 tablet (40 mg total) by mouth daily.   SALICYLIC ACID  6 % SHAM    Apply 1 Application topically daily as needed.  Modified Medications   No medications on file  Discontinued Medications   No medications on file    Allergies Allergies  Allergen Reactions   Aspirin Nausea And Vomiting   Tramadol      Upset stomach     Past Medical History Past Medical History:  Diagnosis Date   Allergy    Diabetes mellitus    GERD (gastroesophageal reflux disease)    Hyperlipidemia    Hypertension    Hyperthyroidism    Recurrent upper respiratory infection (URI)  Past Surgical History Past Surgical History:  Procedure Laterality Date   ANTERIOR CERVICAL DECOMP/DISCECTOMY FUSION N/A 10/11/2016   Procedure: ANTERIOR CERVICAL DISCECTOMY FUSION C6-7 WITH EXCISION OF OSTEOPHYTE;  Surgeon: Lynwood FORBES Better, MD;  Location: [facility] OR;  Service: Orthopedics;  Laterality: N/A;   CESAREAN SECTION     TUBAL LIGATION      Family History family history is not on file.  Social History Social History   Socioeconomic History   Marital status: Married    Spouse name: Not on file   Number of children: Not on file   Years of education: Not on file   Highest education level: Not on file  Occupational History   Not on file  Tobacco Use   Smoking status: Never   Smokeless tobacco: Never  Vaping Use   Vaping status: Never Used  Substance and Sexual Activity   Alcohol use: No   Drug use: No   Sexual activity: Not on file  Other Topics Concern   Not on file  Social History Narrative   Not on file   Social Drivers of Health   Financial Resource Strain: Not on  file  Food Insecurity: Not on file  Transportation Needs: Not on file  Physical Activity: Not on file  Stress: Not on file  Social Connections: Not on file  Intimate Partner Violence: Not on file    Lab Results  Component Value Date   CHOL 208 (H) 12/15/2023   Lab Results  Component Value Date   HDL 55 12/15/2023   Lab Results  Component Value Date   LDLCALC 132 (H) 12/15/2023   Lab Results  Component Value Date   TRIG 106 12/15/2023   Lab Results  Component Value Date   CHOLHDL 3.8 12/15/2023   Lab Results  Component Value Date   CREATININE 0.31 (L) 12/15/2023   Lab Results  Component Value Date   GFR 204.34 12/15/2019      Component Value Date/Time   NA 139 12/15/2023 0923   NA 141 10/14/2023 1423   K 4.6 12/15/2023 0923   CL 102 12/15/2023 0923   CO2 26 12/15/2023 0923   GLUCOSE 234 (H) 12/15/2023 0923   BUN 12 12/15/2023 0923   BUN 10 10/14/2023 1423   CREATININE 0.31 (L) 12/15/2023 0923   CALCIUM  9.4 12/15/2023 0923   PROT 6.8 12/15/2023 0923   PROT 6.8 10/14/2023 1423   ALBUMIN 4.0 10/14/2023 1423   AST 15 12/15/2023 0923   ALT 28 12/15/2023 0923   ALKPHOS 133 (H) 10/14/2023 1423   BILITOT 0.5 12/15/2023 0923   BILITOT 0.3 10/14/2023 1423   GFRNONAA 113 08/11/2020 0950   GFRNONAA 117 08/14/2017 1542   GFRAA 131 08/11/2020 0950   GFRAA 136 08/14/2017 1542      Latest Ref Rng & Units 12/15/2023    9:23 AM 10/14/2023    2:23 PM 07/02/2023    2:21 PM  BMP  Glucose 65 - 99 mg/dL 765  848  853   BUN 7 - 25 mg/dL 12  10  8    Creatinine 0.50 - 1.05 mg/dL 9.68  9.62  9.65   BUN/Creat Ratio 6 - 22 (calc) 39  27  24   Sodium 135 - 146 mmol/L 139  141  140   Potassium 3.5 - 5.3 mmol/L 4.6  4.4  4.2   Chloride 98 - 110 mmol/L 102  102  101   CO2 20 - 32 mmol/L 26  23  23  Calcium  8.6 - 10.4 mg/dL 9.4  9.7  9.6        Component Value Date/Time   WBC 8.7 10/14/2023 1423   WBC 7.9 12/15/2019 1558   RBC 5.46 (H) 10/14/2023 1423   RBC 5.36 (H)  12/15/2019 1558   HGB 12.5 10/14/2023 1423   HCT 41.5 10/14/2023 1423   PLT 411 10/14/2023 1423   MCV 76 (L) 10/14/2023 1423   MCH 22.9 (L) 10/14/2023 1423   MCH 23.9 (L) 11/08/2016 1500   MCHC 30.1 (L) 10/14/2023 1423   MCHC 32.0 12/15/2019 1558   RDW 13.6 10/14/2023 1423   LYMPHSABS 2.7 09/26/2021 1547   MONOABS 0.5 12/15/2019 1558   EOSABS 0.1 09/26/2021 1547   BASOSABS 0.0 09/26/2021 1547   Lab Results  Component Value Date   TSH 0.01 (L) 08/11/2024   TSH <0.01 (L) 03/02/2024   TSH <0.01 (L) 01/30/2024   FREET4 2.5 (H) 08/11/2024   FREET4 2.0 (H) 03/02/2024   FREET4 2.7 (H) 01/30/2024         Parts of this note may have been dictated using voice recognition software. There may be variances in spelling and vocabulary which are unintentional. Not all errors are proofread. Please notify the dino if any discrepancies are noted or if the meaning of any statement is not clear.

## 2024-08-19 ENCOUNTER — Other Ambulatory Visit: Payer: Self-pay

## 2024-08-19 ENCOUNTER — Other Ambulatory Visit (HOSPITAL_COMMUNITY): Payer: Self-pay

## 2024-08-19 LAB — HEMOGLOBIN A1C
Hgb A1c MFr Bld: 9.1 % — ABNORMAL HIGH (ref <5.7)
Mean Plasma Glucose: 214 mg/dL
eAG (mmol/L): 11.9 mmol/L

## 2024-08-19 MED ORDER — SITAGLIPTIN PHOSPHATE 100 MG PO TABS
100.0000 mg | ORAL_TABLET | Freq: Every day | ORAL | 1 refills | Status: DC
  Filled 2024-08-19: qty 90, 90d supply, fill #0

## 2024-08-27 ENCOUNTER — Other Ambulatory Visit: Payer: Self-pay

## 2024-08-30 ENCOUNTER — Other Ambulatory Visit: Payer: Self-pay

## 2024-09-08 ENCOUNTER — Other Ambulatory Visit: Payer: Self-pay | Admitting: "Endocrinology

## 2024-09-08 ENCOUNTER — Other Ambulatory Visit: Payer: Self-pay

## 2024-09-08 ENCOUNTER — Telehealth: Payer: Self-pay | Admitting: "Endocrinology

## 2024-09-08 DIAGNOSIS — E1165 Type 2 diabetes mellitus with hyperglycemia: Secondary | ICD-10-CM

## 2024-09-08 MED ORDER — GLIMEPIRIDE 4 MG PO TABS
8.0000 mg | ORAL_TABLET | Freq: Every day | ORAL | 1 refills | Status: DC
  Filled 2024-09-08: qty 180, 90d supply, fill #0

## 2024-09-08 MED ORDER — EMPAGLIFLOZIN 25 MG PO TABS
25.0000 mg | ORAL_TABLET | Freq: Every day | ORAL | 3 refills | Status: DC
  Filled 2024-09-08: qty 90, 90d supply, fill #0

## 2024-09-08 MED ORDER — EMPAGLIFLOZIN 25 MG PO TABS
25.0000 mg | ORAL_TABLET | Freq: Every day | ORAL | 3 refills | Status: AC
  Filled 2024-09-08: qty 90, 90d supply, fill #0

## 2024-09-08 NOTE — Telephone Encounter (Signed)
 Requested Prescriptions   Pending Prescriptions Disp Refills   glimepiride  (AMARYL ) 4 MG tablet 180 tablet 1    Sig: Take 2 tablets (8 mg total) by mouth daily before breakfast.

## 2024-09-08 NOTE — Telephone Encounter (Signed)
 MEDICATION: Jardiance   PHARMACY:    [facility] - [place], [place] - [address] (Ph: [phone])    HAS THE PATIENT CONTACTED THEIR PHARMACY?  Yes  LAST REFILL:  @@LASTREFILL @  IS THIS A 90 DAY SUPPLY : Yes  IS PATIENT OUT OF MEDICATION: Yes  IF NOT; HOW MUCH IS LEFT:   LAST APPOINTMENT DATE: @ 08/18/2024  NEXT APPOINTMENT DATE:@1 /19/2026  DO WE HAVE YOUR PERMISSION TO LEAVE A DETAILED MESSAGE?:Yes  OTHER COMMENTS: Patient states that she is due to take it now   **Let patient know to contact pharmacy at the end of the day to make sure medication is ready. **  ** Please notify patient to allow 48-72 hours to process**  **Encourage patient to contact the pharmacy for refills or they can request refills through [facility]**

## 2024-09-09 NOTE — Telephone Encounter (Signed)
 Refill was sent in yesterday by Dr Dartha.

## 2024-09-15 NOTE — Therapy (Addendum)
 " OUTPATIENT PHYSICAL THERAPY LOWER EXTREMITY EVALUATION   Patient Name: Crystal [name] MRN: [id number] DOB:19-May-1961, 63 y.o., female Today's Date: 09/16/2024   PHYSICAL THERAPY DISCHARGE SUMMARY  Visits from Start of Care: 1  Patient is being discharged per patient request. I spoke with patient after missed appt today. She would not like to resume PT at this time, as she is recovering from thyroidectomy procedure. She states that she doesn't want to start PT until April. We will require a new referral at that time, and look forward to working with her when she is ready.   Marko Molt, PT, DPT  10/27/2024 3:15 PM       END OF SESSION:  PT End of Session - 09/16/24 1420     Visit Number 1    Number of Visits 13    Date for Recertification  11/05/24    Authorization Type [place] MEDICAID [employer]    Authorization - Visit Number 1    PT Start Time 1417    PT Stop Time 1500    PT Time Calculation (min) 43 min    Activity Tolerance Patient tolerated treatment well    Behavior During Therapy WFL for tasks assessed/performed          Past Medical History:  Diagnosis Date   Allergy    Diabetes mellitus    GERD (gastroesophageal reflux disease)    Hyperlipidemia    Hypertension    Hyperthyroidism    Recurrent upper respiratory infection (URI)    Past Surgical History:  Procedure Laterality Date   ANTERIOR CERVICAL DECOMP/DISCECTOMY FUSION N/A 10/11/2016   Procedure: ANTERIOR CERVICAL DISCECTOMY FUSION C6-7 WITH EXCISION OF OSTEOPHYTE;  Surgeon: Lynwood FORBES Better, MD;  Location: [facility] OR;  Service: Orthopedics;  Laterality: N/A;   CESAREAN SECTION     TUBAL LIGATION     Patient Active Problem List   Diagnosis Date Noted   Pruritus 09/26/2021   Other allergic rhinitis 09/26/2021   Possible Temporomandibular joint disorder (TMJ) 09/26/2021   Ear itching 10/23/2020   Hyperthyroidism 12/17/2019   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   Type 2 diabetes  mellitus with hyperglycemia, without long-term current use of insulin  (HCC) 12/17/2019   Dyslipidemia 11/19/2019   Spinal stenosis of cervical region 10/11/2016    Class: Chronic   Cervical spinal stenosis 10/11/2016   Hypertension 09/09/2016   Diabetes mellitus (HCC) 09/09/2016   Multiple thyroid  nodules 01/27/2013   Disorder of thyroid  07/07/2007   VARICOSE VEIN 07/07/2007   GERD 07/07/2007    PCP: Oley Bascom RAMAN, NP   REFERRING PROVIDER: Persons, Ronal Dragon, PA   REFERRING DIAG:  [phone] (ICD-10-CM) - Chronic pain of left knee  M25.561,G89.29 (ICD-10-CM) - Chronic pain of right knee    THERAPY DIAG:  Chronic pain of both knees  Muscle weakness (generalized)  Difficulty in walking, not elsewhere classified  Rationale for Evaluation and Treatment: Rehabilitation  ONSET DATE: 3 months ago  SUBJECTIVE:   SUBJECTIVE STATEMENT: Pt reports developing knee pain, R>L, approx 3 months ago. Pt notes having 2 falls related to her knees giving out.  PERTINENT HISTORY: DM  PAIN:  Are you having pain? Yes: NPRS scale: R 7/10; L 5/10 Pain location: bilat kness Pain description: ache, sharp Aggravating factors: Walking and standing Relieving factors: meds, analgesic creamm  PRECAUTIONS: None  RED FLAGS: None   WEIGHT BEARING RESTRICTIONS: No  FALLS:  Has patient fallen in last 6 months? Yes. Number of falls 2 Knees give out with warning  LIVING ENVIRONMENT: Lives with: lives with their family Lives in: House/apartment Stairs: Yes: External: 3 steps; can reach both Has following equipment at home: Single point cane and Walker - 2 wheeled  OCCUPATION: Doesn't work  PLOF: Independent  PATIENT GOALS: Pain relief and to improve strength  OBJECTIVE:  Note: Objective measures were completed at Evaluation unless otherwise noted.  DIAGNOSTIC FINDINGS:  06/23/24 DG R knee FINDINGS: No evidence of fracture, dislocation, or joint effusion. No evidence of  severe arthropathy or other focal bone abnormality. Soft tissues are unremarkable.   IMPRESSION: No acute displaced fracture or dislocation.  PATIENT SURVEYS:  LEFS: 10/80= 13%  COGNITION: Overall cognitive status: Within functional limits for tasks assessed     SENSATION: WFL  EDEMA:  None observed or palpated of bilat knees  MUSCLE LENGTH: Hamstrings: Right WNLs deg; Left WNLs deg Debby test: Right WNLs deg; Left WNLs deg  POSTURE: rounded shoulders and forward head  PALPATION: R medial joint line  LOWER EXTREMITY ROM:  Active ROM Right eval Left eval  Hip flexion    Hip extension    Hip abduction    Hip adduction    Hip internal rotation    Hip external rotation    Knee flexion Full pain at end range full  Knee extension Full pain at end range full  Ankle dorsiflexion    Ankle plantarflexion    Ankle inversion    Ankle eversion     (Blank rows = not tested)  LOWER EXTREMITY MMT:  MMT Right eval Left eval  Hip flexion 3 3+  Hip extension    Hip abduction 3 3+  Hip adduction    Hip internal rotation    Hip external rotation 3 3+  Knee flexion 4+ 4+  Knee extension 4 4  Ankle dorsiflexion    Ankle plantarflexion    Ankle inversion    Ankle eversion     (Blank rows = not tested)  LOWER EXTREMITY SPECIAL TESTS:  Knee special tests: Patellafemoral apprehension test: positive  and Patellafemoral grind test: positive   FUNCTIONAL TESTS:  5 times sit to stand: TBA 2 minute walk test: TBA  GAIT: Distance walked: 200' Assistive device utilized: None Level of assistance: Complete Independence Comments: Decreased pace                                                                                                                TREATMENT DATE:  [facility] Adult PT Treatment:                                                DATE: 09/16/24 Therapeutic Exercise: Developed, instructed in, and pt completed therex as noted in HEP  Self Care: Eval finding and  purpose of PT   PATIENT EDUCATION:  Education details: Eval findings, POC, HEP, self care  Person educated: Patient Education method: Explanation, Demonstration, Tactile cues, Verbal  cues, and Handouts Education comprehension: verbalized understanding, returned demonstration, verbal cues required, and tactile cues required  HOME EXERCISE PROGRAM: Access Code: [email or web address] URL: [email or web address] Date: 09/16/2024 Prepared by: Dasie Daft  Exercises - Supine Heel Slides  - 2 x daily - 7 x weekly - 2 sets - 10 reps - 5 hold - Active Straight Leg Raise with Quad Set  - 2 x daily - 7 x weekly - 2 sets - 10 reps - 3 hold - Supine Bridge  - 2 x daily - 7 x weekly - 2 sets - 10 reps - 3 hold  ASSESSMENT:  CLINICAL IMPRESSION: Patient is a 63 y.o. female who was seen today for physical therapy evaluation and treatment for  M25.562,G89.29 (ICD-10-CM) - Chronic pain of left knee  M25.561,G89.29 (ICD-10-CM) - Chronic pain of right knee  Pt presents to PT with the following deficits: bilat knee pain R>L; hip and knee weakness R>L; decreased functional mobility with pt walking at a decreased pace and Hx of falls with knees giving out. A HEP was provided to address ROM and strength. Pt will benefit from skilled PT 2w^ to address impairments to optimize function with less pain.    OBJECTIVE IMPAIRMENTS: decreased activity tolerance, decreased balance, difficulty walking, decreased ROM, decreased strength, and pain.   ACTIVITY LIMITATIONS: carrying, lifting, bending, sitting, standing, squatting, sleeping, stairs, and locomotion level  PARTICIPATION LIMITATIONS: meal prep, cleaning, laundry, and shopping  PERSONAL FACTORS: Fitness, Past/current experiences, and 1 comorbidity: DM are also affecting patient's functional outcome.   REHAB POTENTIAL: Good  CLINICAL DECISION MAKING: Evolving/moderate complexity  EVALUATION COMPLEXITY: Moderate   GOALS:  SHORT TERM GOALS: Target date:  10/08/24  Pt will be Ind in an initial HEP  Baseline:started Goal status: INITIAL  2.  Pt will report 25% or better improvement in her knee pain for improved function and QOL Baseline: R 7/10; L 5/10 Goal status: INITIAL  LONG TERM GOALS: Target date: 11/05/24  Pt will be Ind in a final HEP to maintain achieved LOF  Baseline: started Goal status: INITIAL  2.  Pt will report 75% or better improvement in her knee pain for improved function and QOL Baseline: R 7/10; L 5/10 Goal status: INITIAL  3.  Increase bilat knee and hip strength by 1/2 muscle grade or better for improved knee support Baseline: see flow sheets Goal status: INITIAL  4.  Improve 5xSTS by MCID of 5 and by MCID of 36ft as indication of improved functional mobility  Baseline: TBA Goal status: INITIAL  5.  Improve SL stance time by 8 sec as indication of improved balance and strength Baseline: TBA Goal status: INITIAL  6.  Pt's LEFS score will improve to 28% or greater as indication of improved function  Baseline: 13% Goal status: INITIAL   PLAN:  PT FREQUENCY: 2x/week  PT DURATION: 6 weeks  PLANNED INTERVENTIONS: 97164- PT Re-evaluation, 97110-Therapeutic exercises, 97530- Therapeutic activity, 97112- Neuromuscular re-education, 97535- Self Care, 02859- Manual therapy, [phone]- Gait training, [phone]- Aquatic Therapy, [phone]- Electrical stimulation (unattended), Patient/Family education, Balance training, Stair training, Taping, Joint mobilization, Cryotherapy, and Moist heat  PLAN FOR NEXT SESSION: Assess Sl stance, 5xSTS, and ; assess response to HEP; progress therex as indicated; use of modalities, manual therapy as indicated.    Dasie Daft, PT 09/16/2024, 6:25 PM   For all possible CPT codes, reference the Planned Interventions line above.     Check all conditions that are expected to impact treatment: {Conditions expected to impact  treatment:Musculoskeletal disorders   If treatment  provided at initial evaluation, no treatment charged due to lack of authorization.      "

## 2024-09-16 ENCOUNTER — Ambulatory Visit

## 2024-09-16 ENCOUNTER — Ambulatory Visit: Payer: Self-pay | Admitting: Surgery

## 2024-09-16 ENCOUNTER — Other Ambulatory Visit: Payer: Self-pay

## 2024-09-16 DIAGNOSIS — G8929 Other chronic pain: Secondary | ICD-10-CM

## 2024-09-16 DIAGNOSIS — M6281 Muscle weakness (generalized): Secondary | ICD-10-CM | POA: Insufficient documentation

## 2024-09-16 DIAGNOSIS — R262 Difficulty in walking, not elsewhere classified: Secondary | ICD-10-CM

## 2024-09-16 DIAGNOSIS — M25561 Pain in right knee: Secondary | ICD-10-CM | POA: Insufficient documentation

## 2024-09-16 DIAGNOSIS — M25562 Pain in left knee: Secondary | ICD-10-CM | POA: Insufficient documentation

## 2024-09-26 NOTE — Progress Notes (Signed)
 COVID Vaccine received:  []  No [x]  Yes Date of any COVID positive Test in last 37 days:None  PCP - Bascom Borer, NP  Cardiologist -  Endocrinology- [facility]. DM   Chest x-ray -  EKG -  05-12-2020  Repeat DOS- patient requested d/t Religious reasons since she will have to undress then for surgery.  Stress Test -  ECHO -  Cardiac Cath -  CT Coronary Calcium  score:   Pacemaker / ICD device [x]  No []  Yes   Spinal Cord Stimulator:[x]  No []  Yes       History of Sleep Apnea? [x]  No []  Yes   CPAP used?- [x]  No []  Yes    Medication on DOS: Amlodipine ,  Tylenol ,   Pantoprazole , Flonase  Nasal spray  Patient has: []  NO Hx DM   []  Pre-DM   []  DM1  [x]   DM2 Does the patient monitor blood sugar?   []  N/A   []  No [x]  Yes  Last A1c was:  9.1  on 08-18-2024     Does patient have a [employer] or Dexcom? [x]  No []  Yes   Fasting Blood Sugar Ranges- 150-170 Checks Blood Sugar _2-3_ times a week  Sitagliptin  (Januvia )- Hold DOS   stopped 2-3 months ago per patient Metformin - Hold DOS, Glimepiride  (Amaryl )- Hold DOS  stopped last month per patient  Empagliflozin  (Jardiance )  Hold x 72 hrs. Last 10-04-2024  Blood Thinner / Instructions:  none Aspirin Instructions:   none  Activity level: Able to walk up 2 flights of stairs without becoming significantly short of breath or having chest pain?   [x]    Yes   []  No,  would have:  Patient can perform ADLs without assistance.  [x]   Yes    []  No   Anesthesia review: uncontrolled DM2, GERD, HTN, s/p ACDF (C6-7) in 2018,   Stopped taking Thyroid  meds- Tapazole  because it doesn't Work - patient was very jittery during PST and could barely sign her consent d/t hands shaking  Patient denies any S&S of respiratory illness or Covid - no shortness of breath, fever, cough or chest pain at PAT appointment.  Patient verbalized understanding and agreement to the Pre-Surgical Instructions that were given to them at this PAT appointment. Patient was also  educated of the need to review these PAT instructions again prior to her surgery.I reviewed the appropriate phone numbers to call if they have any and questions or concerns.

## 2024-09-26 NOTE — Patient Instructions (Signed)
 SURGICAL WAITING ROOM VISITATION Patients having surgery or a procedure may have no more than 2 support people in the waiting area - these visitors may rotate in the visitor waiting room.   If the patient needs to stay at the hospital during part of their recovery, the visitor guidelines for inpatient rooms apply.  PRE-OP VISITATION  Pre-op nurse will coordinate an appropriate time for 1 support person to accompany the patient in pre-op.  This support person may not rotate.  This visitor will be contacted when the time is appropriate for the visitor to come back in the pre-op area.  Temporary Visitor Restrictions   Children ages 50 and under will not be able to visit patients in [facility] under most circumstances. Visitation is not restricted outside of hospitals unless noted otherwise in the [facility] and Location Specific Visitation Guidelines at :       [email or web address]  Visitors with respiratory illnesses are discouraged from visiting and should remain at home.  You are not required to quarantine at this time prior to your surgery. However, you must do this: Hand Hygiene often Do NOT share personal items Notify your provider if you are in close contact with someone who has COVID or you develop fever 100.4 or greater, new onset of sneezing, cough, sore throat, shortness of breath or body aches.  If you test positive for Covid or have been in contact with anyone that has tested positive in the last 10 days please notify you surgeon.    Your procedure is scheduled on:  Friday 10-08-2024  Report to [facility] Main Entrance: [address] entrance where the [employer] is available.   Report to admitting at: 08:30    AM  Call this number if you have any questions or problems the morning of surgery [phone]  FOLLOW ANY ADDITIONAL PRE OP INSTRUCTIONS YOU RECEIVED FROM YOUR SURGEON'S OFFICE!!!  Do not eat food after Midnight the night prior to your  surgery/procedure.  After Midnight you may have the following liquids until   07:45 AM DAY OF SURGERY  Clear Liquid Diet Water Black Coffee (sugar ok, NO MILK/CREAM OR CREAMERS)  Tea (sugar ok, NO MILK/CREAM OR CREAMERS) regular and decaf                             Plain Jell-O  with no fruit (NO RED)                                           Fruit ices (not with fruit pulp, NO RED)                                     Popsicles (NO RED)                                                                  Juice: NO CITRUS JUICES: only apple, WHITE grape, WHITE cranberry Sports drinks like Gatorade or Powerade (NO RED)  Oral Hygiene is also important to reduce your risk of infection.        Remember - BRUSH YOUR TEETH THE MORNING OF SURGERY WITH YOUR REGULAR TOOTHPASTE  Do NOT smoke after Midnight the night before surgery.  Sitagliptin  (Januvia )- Don't take the morning of your surgery  Metformin - Don't take the morning of your surgery  Glimepiride  (Amaryl )- Don't take the morning of your surgery  Empagliflozin  (Jardiance )  Hold x 72 hrs. Last 09-14-2024  Trulicity  inj-  Hold 7-10 days  Last injection:  ???  STOP TAKING all Vitamins, Herbs and supplements 1 week before your surgery.   Take ONLY these medicines the morning of surgery with A SIP OF WATER: Amlodipine , Methimazole  (Tapazole ), and you may take Pantoprazole  and Tylenol  if needed. You may use your Flonase  nasal spray.   If You have been diagnosed with Sleep Apnea - Bring CPAP mask and tubing day of surgery. We will provide you with a CPAP machine on the day of your surgery.                   You may not have any metal on your body including hair pins, jewelry, and body piercing  Do not wear make-up, lotions, powders, perfumes or deodorant  Do not wear nail polish including gel and S&S, artificial / acrylic nails, or any other type of covering on natural nails including finger and toenails. If you have  artificial nails, gel coating, etc., that needs to be removed by a nail salon, Please have this removed prior to surgery. Not doing so may mean that your surgery could be cancelled or delayed if the Surgeon or anesthesia staff feels like they are unable to monitor you safely.   Do not shave 48 hours prior to surgery to avoid nicks in your skin which may contribute to postoperative infections.   Contacts, Hearing Aids, dentures or bridgework may not be worn into surgery. DENTURES WILL BE REMOVED PRIOR TO SURGERY PLEASE DO NOT APPLY Poly grip OR ADHESIVES!!!  You may bring a small overnight bag with you on the day of surgery, only pack items that are not valuable. [facility] IS NOT RESPONSIBLE   FOR VALUABLES THAT ARE LOST OR STOLEN.   Do not bring your home medications to the hospital. The Pharmacy will dispense medications listed on your medication list to you during your admission in the Hospital.  Please read over the following fact sheets you were given: IF YOU HAVE QUESTIONS ABOUT YOUR PRE-OP INSTRUCTIONS, PLEASE CALL [phone]   [facility] - Preparing for Surgery        Before surgery, you can play an important role.  Because skin is not sterile, your skin needs to be as free of germs as possible.  You can reduce the number of germs on your skin by washing with CHG (chlorahexidine gluconate) soap before surgery.  CHG is an antiseptic cleaner which kills germs and bonds with the skin to continue killing germs even after washing. Please DO NOT use if you have an allergy to CHG or antibacterial soaps.  If your skin becomes reddened/irritated stop using the CHG and inform your nurse when you arrive at Short Stay. Do not shave (including legs and underarms) for at least 48 hours prior to the first CHG shower.  You may shave your face/neck.  Please follow these instructions carefully:  1.  Shower with CHG Soap the night before surgery ONLY (DO NOT USE THE CHG SOAP THE MORNING OF  SURGERY).  2.  If you choose to wash your hair, wash your hair first as usual with your normal  shampoo.  3.  After you shampoo, rinse your hair and body thoroughly to remove the shampoo.                             4.  Use CHG as you would any other liquid soap.  You can apply chg directly to the skin and wash.  Gently with a scrungie or clean washcloth.  5.  Apply the CHG Soap to your body ONLY FROM THE NECK DOWN.   Do not use on face/ open                           Wound or open sores. Avoid contact with eyes, ears mouth and genitals (private parts).                       Wash face,  Genitals (private parts) with your normal soap.             6.  Wash thoroughly, paying special attention to the area where your  surgery  will be performed.  7.  Thoroughly rinse your body with warm water from the neck down.  8.  DO NOT shower/wash with your normal soap after using and rinsing off the CHG Soap.                9.  Pat yourself dry with a clean towel.            10.  Wear clean pajamas.            11.  Place clean sheets on your bed the night of your first shower and do not  sleep with pets.  Day of Surgery : Do not apply any CHG, lotions/deodorants the morning of surgery.  Please wear clean clothes to the hospital/surgery center.   FAILURE TO FOLLOW THESE INSTRUCTIONS MAY RESULT IN THE CANCELLATION OF YOUR SURGERY  PATIENT SIGNATURE_________________________________  NURSE SIGNATURE__________________________________  ________________________________________________________________________

## 2024-09-28 ENCOUNTER — Other Ambulatory Visit: Payer: Self-pay

## 2024-09-28 ENCOUNTER — Encounter (HOSPITAL_COMMUNITY): Payer: Self-pay

## 2024-09-28 ENCOUNTER — Encounter (HOSPITAL_COMMUNITY)
Admission: RE | Admit: 2024-09-28 | Discharge: 2024-09-28 | Disposition: A | Discharge status: Home / Self Care | Source: Ambulatory Visit | Attending: Surgery | Admitting: Surgery

## 2024-09-28 VITALS — BP 150/78 | HR 98 | Temp 97.8°F | Resp 20 | Ht 65.0 in | Wt 141.0 lb

## 2024-09-28 DIAGNOSIS — E0520 Thyrotoxicosis with toxic multinodular goiter without thyrotoxic crisis or storm: Secondary | ICD-10-CM | POA: Insufficient documentation

## 2024-09-28 DIAGNOSIS — Z01818 Encounter for other preprocedural examination: Secondary | ICD-10-CM | POA: Diagnosis present

## 2024-09-28 DIAGNOSIS — I10 Essential (primary) hypertension: Secondary | ICD-10-CM | POA: Diagnosis not present

## 2024-09-28 DIAGNOSIS — E1165 Type 2 diabetes mellitus with hyperglycemia: Secondary | ICD-10-CM | POA: Insufficient documentation

## 2024-09-28 DIAGNOSIS — K219 Gastro-esophageal reflux disease without esophagitis: Secondary | ICD-10-CM | POA: Diagnosis not present

## 2024-09-28 DIAGNOSIS — E042 Nontoxic multinodular goiter: Secondary | ICD-10-CM

## 2024-09-28 HISTORY — DX: Cardiac arrhythmia, unspecified: I49.9

## 2024-09-28 HISTORY — DX: Unspecified osteoarthritis, unspecified site: M19.90

## 2024-09-28 LAB — BASIC METABOLIC PANEL WITH GFR
Anion gap: 15 (ref 5–15)
BUN: 13 mg/dL (ref 8–23)
CO2: 23 mmol/L (ref 22–32)
Calcium: 10 mg/dL (ref 8.9–10.3)
Chloride: 101 mmol/L (ref 98–111)
Creatinine, Ser: 0.33 mg/dL — ABNORMAL LOW (ref 0.44–1.00)
GFR, Estimated: >60 mL/min
Glucose, Bld: 189 mg/dL — ABNORMAL HIGH (ref 70–99)
Potassium: 4.3 mmol/L (ref 3.5–5.1)
Sodium: 139 mmol/L (ref 135–145)

## 2024-09-28 LAB — CBC
HCT: 46.3 % — ABNORMAL HIGH (ref 36.0–46.0)
Hemoglobin: 13.8 g/dL (ref 12.0–15.0)
MCH: 23.5 pg — ABNORMAL LOW (ref 26.0–34.0)
MCHC: 29.8 g/dL — ABNORMAL LOW (ref 30.0–36.0)
MCV: 79 fL — ABNORMAL LOW (ref 80.0–100.0)
Platelets: 326 K/uL (ref 150–400)
RBC: 5.86 MIL/uL — ABNORMAL HIGH (ref 3.87–5.11)
RDW: 14.9 % (ref 11.5–15.5)
WBC: 8.2 K/uL (ref 4.0–10.5)
nRBC: 0 % (ref 0.0–0.2)

## 2024-09-28 LAB — HEMOGLOBIN A1C
Hgb A1c MFr Bld: 8.8 % — ABNORMAL HIGH (ref 4.8–5.6)
Mean Plasma Glucose: 205.86 mg/dL

## 2024-09-28 LAB — GLUCOSE, CAPILLARY: Glucose-Capillary: 189 mg/dL — ABNORMAL HIGH (ref 70–99)

## 2024-09-29 NOTE — Anesthesia Preprocedure Evaluation (Addendum)
"                                    Anesthesia Evaluation  Patient identified by MRN, date of birth, ID band Patient awake    Reviewed: Allergy & Precautions, NPO status , Patient's Chart, lab work & pertinent test results  Airway Mallampati: I  TM Distance: >3 FB Neck ROM: Full    Dental  (+) Implants, Dental Advisory Given, Teeth Intact   Pulmonary Recent URI    Pulmonary exam normal breath sounds clear to auscultation       Cardiovascular hypertension, Pt. on medications Normal cardiovascular exam Rhythm:Regular Rate:Normal     Neuro/Psych negative neurological ROS  negative psych ROS   GI/Hepatic Neg liver ROS,GERD  ,,  Endo/Other  diabetes, Type 2, Oral Hypoglycemic Agents Hyperthyroidism   Renal/GU negative Renal ROS  negative genitourinary   Musculoskeletal  (+) Arthritis ,    Abdominal   Peds  Hematology negative hematology ROS (+)   Anesthesia Other Findings   Reproductive/Obstetrics                              Anesthesia Physical Anesthesia Plan  ASA: 2  Anesthesia Plan: General   Post-op Pain Management: Tylenol  PO (pre-op)*   Induction: Intravenous  PONV Risk Score and Plan: 3 and Midazolam , Dexamethasone  and Ondansetron   Airway Management Planned: Oral ETT  Additional Equipment:   Intra-op Plan:   Post-operative Plan: Extubation in OR  Informed Consent: I have reviewed the patients History and Physical, chart, labs and discussed the procedure including the risks, benefits and alternatives for the proposed anesthesia with the patient or authorized representative who has indicated his/her understanding and acceptance.     Dental advisory given  Plan Discussed with: CRNA  Anesthesia Plan Comments: (See PAT note from 12/23)         Anesthesia Quick Evaluation  "

## 2024-09-29 NOTE — Progress Notes (Signed)
 " Case: [id number] Date/Time: 10/08/24 1034   Procedure: THYROIDECTOMY, COMPLETION - COMPLETION THYROIDECTOMY RIGHT LOBE   Anesthesia type: General   Pre-op diagnosis: TOXIC MULTINODULAR GOITER   Location: [facility] ROOM 01 / [facility]   Surgeons: Eletha Boas, MD       DISCUSSION: Crystal [name] is a 63 yo female with PMH of HTN, GERD, uncontrolled DM, hyperthyroidism, s/p L thyroidectomy, arthritis, s/p ACDF C6-7 (2018).  Patient with long standing hx of hyperthyroidism 2/2 Graves disease. Last TSH was 0.01. She follows with Endocrinology and is scheduled for above surgery due to failure of medical management. She also has hx of uncontrolled DM. She has refused multiple diabetes medicines. Pre op A1c 8.8. Glucose was 189. Patient has refused initiating insulin .  VS: BP (!) 150/78 Comment: left arm sitting  Pulse 98   Temp 36.6 C (Oral)   Resp 20   Ht 5' 5 (1.651 m)   Wt 64 kg   LMP 06/02/2013   SpO2 99%   BMI 23.46 kg/m   PROVIDERS: Oley Bascom RAMAN, NP   LABS: Labs reviewed: Acceptable for surgery. (all labs ordered are listed, but only abnormal results are displayed)  Labs Reviewed  HEMOGLOBIN A1C - Abnormal; Notable for the following components:      Result Value   Hgb A1c MFr Bld 8.8 (*)    All other components within normal limits  BASIC METABOLIC PANEL WITH GFR - Abnormal; Notable for the following components:   Glucose, Bld 189 (*)    Creatinine, Ser 0.33 (*)    All other components within normal limits  CBC - Abnormal; Notable for the following components:   RBC 5.86 (*)    HCT 46.3 (*)    MCV 79.0 (*)    MCH 23.5 (*)    MCHC 29.8 (*)    All other components within normal limits  GLUCOSE, CAPILLARY - Abnormal; Notable for the following components:   Glucose-Capillary 189 (*)    All other components within normal limits     IMAGES:   EKG - pt refused in pre op due to religious reasons. Obtain DOS.   CV:  Past Medical History:  Diagnosis Date   Allergy     Arthritis    Diabetes mellitus    Dysrhythmia    tachycardia at times   GERD (gastroesophageal reflux disease)    Hyperlipidemia    Hypertension    Hyperthyroidism    Recurrent upper respiratory infection (URI)     Past Surgical History:  Procedure Laterality Date   ANTERIOR CERVICAL DECOMP/DISCECTOMY FUSION N/A 10/11/2016   Procedure: ANTERIOR CERVICAL DISCECTOMY FUSION C6-7 WITH EXCISION OF OSTEOPHYTE;  Surgeon: Lynwood FORBES Better, MD;  Location: [facility];  Service: Orthopedics;  Laterality: N/A;   CESAREAN SECTION     x1   THYROIDECTOMY, PARTIAL Left 1992   in [place],   TUBAL LIGATION      MEDICATIONS:  acetaminophen  (TYLENOL ) 500 MG tablet   amLODipine  (NORVASC ) 10 MG tablet   blood glucose meter kit and supplies   Blood Pressure Monitor DEVI   cetirizine  (ZYRTEC  ALLERGY) 10 MG tablet   Ciclopirox  1 % shampoo   diclofenac  Sodium (VOLTAREN  ARTHRITIS PAIN) 1 % GEL   empagliflozin  (JARDIANCE ) 25 MG TABS tablet   fluocinonide  ointment (LIDEX ) 0.05 %   fluticasone  (FLONASE ) 50 MCG/ACT nasal spray   glimepiride  (AMARYL ) 4 MG tablet   hydroquinone  4 % cream   Lancets (ONETOUCH ULTRASOFT) lancets   lidocaine  (LIDODERM ) 5 %  metFORMIN  (GLUCOPHAGE ) 500 MG tablet   methimazole  (TAPAZOLE ) 10 MG tablet   pantoprazole  (PROTONIX ) 40 MG tablet   Salicylic Acid  6 % SHAM   sitaGLIPtin  (JANUVIA ) 100 MG tablet    MEDERMA gel    Burnard CHRISTELLA Senna, PA-C [facility] Short Stay/Anesthesiology [facility] Phone [phone] 09/29/2024 3:50 PM       "

## 2024-10-01 ENCOUNTER — Encounter (HOSPITAL_COMMUNITY): Payer: Self-pay | Admitting: Surgery

## 2024-10-01 DIAGNOSIS — Z9009 Acquired absence of other part of head and neck: Secondary | ICD-10-CM | POA: Diagnosis present

## 2024-10-01 NOTE — H&P (Signed)
 "    REFERRING PHYSICIAN: Dartha Ernst, MD  PROVIDER: Jenniferann Stuckert Crystal SPINNER, MD   Chief Complaint: New Consultation (Thyroid  nodules, hyperthyroidism)  History of Present Illness:  Patient is referred by Dr. Komal Motwani for surgical evaluation and management of a toxic multinodular goiter. Patient is from the [place]. She had undergone a left thyroid  lobectomy in 1992 in [place]. This was apparently for benign nodular disease. Patient has never been on thyroid  medication. Patient does note that she had loss of voice for approximately 3 months following that surgery. Her voice later recovered. Patient has now developed enlarging nodules in the right thyroid  lobe. The superior nodule measures 5 cm in size and the inferior nodule measures 3.6 cm in size. The patient has developed signs and symptoms of hyperthyroidism and has an undetectable TSH level. She has not responded well to methimazole . She is now referred for consideration for completion thyroidectomy for management of toxic nodular goiter. Patient denies any dysphagia. She denies dyspnea. She has had approximately a 10 pound weight loss over the past 2 months. Presents today to discuss proceeding with thyroid  surgery.  Review of Systems: A complete review of systems was obtained from the patient. I have reviewed this information and discussed as appropriate with the patient. See HPI as well for other ROS.  Review of Systems  Constitutional: Positive for weight loss.  HENT: Negative.  Eyes: Negative.  Respiratory: Negative.  Cardiovascular: Negative.  Gastrointestinal: Negative.  Genitourinary: Negative.  Musculoskeletal: Negative.  Skin: Negative.  Neurological: Negative.  Endo/Heme/Allergies: Negative.  Psychiatric/Behavioral: Negative.    Medical History: Past Medical History:  Diagnosis Date  Diabetes mellitus without complication (CMS/HHS-HCC)  GERD (gastroesophageal reflux disease)  Hyperlipidemia  Hypertension  Thyroid   disease   Patient Active Problem List  Diagnosis  Multiple thyroid  nodules  History of lobectomy of thyroid   Hyperthyroidism   History reviewed. No pertinent surgical history.   Allergies  Allergen Reactions  Aspirin Nausea And Vomiting  Tramadol  Other (See Comments)  Upset stomach   Current Outpatient Medications on File Prior to Visit  Medication Sig Dispense Refill  amLODIPine  (NORVASC ) 10 MG tablet Take 10 mg by mouth once daily  empagliflozin  (JARDIANCE ) 25 mg tablet Take 25 mg by mouth once daily  fluticasone  propionate (FLONASE ) 50 mcg/actuation nasal spray Place 1 spray into one nostril  glimepiride  (AMARYL ) 4 MG tablet Take 8 mg by mouth  lidocaine  (LIDODERM ) 5 % patch Place 1 patch onto the skin once daily REMOVE & DISCARD PATCH WITHIN 12 HOURS OR AS DIRECTED BY MD  metFORMIN  (GLUCOPHAGE ) 500 MG tablet Take 1,000 mg by mouth 2 (two) times daily with meals  pantoprazole  (PROTONIX ) 40 MG DR tablet Take 40 mg by mouth once daily  rosuvastatin  (CRESTOR ) 20 MG tablet  SITagliptin  phosphate (JANUVIA ) 100 MG tablet Take 100 mg by mouth once daily  TRULICITY  0.75 mg/0.5 mL subcutaneous pen injector   No current facility-administered medications on file prior to visit.   Family History  Problem Relation Age of Onset  High blood pressure (Hypertension) Mother  Diabetes Father    Social History   Tobacco Use  Smoking Status Never  Smokeless Tobacco Never    Social History   Socioeconomic History  Marital status: Married  Tobacco Use  Smoking status: Never  Smokeless tobacco: Never  Vaping Use  Vaping status: Never Used  Substance and Sexual Activity  Alcohol use: Never  Drug use: Never   Objective:   Vitals:  BP: (!) 154/81  Pulse: 100  Temp: 36.4 C (97.6 F)  SpO2: 96%  Weight: 64.1 kg (141 lb 6.4 oz)  Height: 154.9 cm (5' 1)  PainSc: 0-No pain   Body mass index is 26.72 kg/m.  Physical Exam   GENERAL APPEARANCE Comfortable, no acute  issues Development: normal Gross deformities: none  SKIN Rash, lesions, ulcers: none Induration, erythema: none Nodules: none palpable  EYES Conjunctiva and lids: normal Pupils: equal  EARS, NOSE, MOUTH, THROAT External ears: no lesion or deformity External nose: no lesion or deformity Hearing: grossly normal  NECK Symmetric: no Trachea: deviation to left Thyroid : There is obvious deviation of the larynx and trachea to the left. Palpation in the left thyroid  bed shows no nodularity or masses. There is no tenderness. There are visible nodules in the right thyroid  bed. The superior nodule measures approximately 5 cm in size. The inferior nodule is about 4 cm in size. These are mildly tender to palpation. They are smooth, slightly firm, and mobile. There is no associated lymphadenopathy.  CHEST/CV Not assessed  ABDOMEN Not assessed  GENITOURINARY/RECTAL Not assessed  MUSCULOSKELETAL Station and gait: normal Digits and nails: no clubbing or cyanosis Muscle strength: grossly normal all extremities Deformity: none  LYMPHATIC Cervical: none palpable Supraclavicular: none palpable  PSYCHIATRIC Oriented to person, place, and time: yes Mood and affect: normal for situation Judgment and insight: appropriate for situation   Assessment and Plan:   Multiple thyroid  nodules History of lobectomy of thyroid  Hyperthyroidism  Patient is referred by her endocrinologist, Dr. [name], for surgical evaluation and management of a toxic nodular goiter.  Patient provided with a copy of The Thyroid  Book: Medical and Surgical Treatment of Thyroid  Problems, published by [employer], 16 pages. Book reviewed and explained to patient during visit today.  Today we reviewed her clinical history. We reviewed her recent laboratory studies. We reviewed her recent ultrasound study. We discussed surgical management. Patient would require a completion thyroidectomy. We discussed the procedure.  We discussed the risk and benefits of surgery including the risk of recurrent laryngeal nerve injury and injury to parathyroid glands. We discussed the size and location of the surgical incision. We discussed the hospital stay to be anticipated. We discussed her postoperative recovery. We discussed the need for lifelong thyroid  hormone replacement. The patient understands and wishes to proceed with surgery in the near future.   Krystal Spinner, MD [facility] practice Office: [phone]   "

## 2024-10-05 ENCOUNTER — Ambulatory Visit: Admitting: Physical Therapy

## 2024-10-08 ENCOUNTER — Encounter (HOSPITAL_COMMUNITY)
Admission: RE | Disposition: A | Discharge status: Home / Self Care | Payer: Self-pay | Source: Ambulatory Visit | Attending: Surgery

## 2024-10-08 ENCOUNTER — Other Ambulatory Visit (HOSPITAL_COMMUNITY): Payer: Self-pay

## 2024-10-08 ENCOUNTER — Encounter (HOSPITAL_COMMUNITY): Payer: Self-pay | Admitting: Surgery

## 2024-10-08 ENCOUNTER — Ambulatory Visit (HOSPITAL_COMMUNITY)
Admission: RE | Admit: 2024-10-08 | Discharge: 2024-10-09 | Disposition: A | Discharge status: Home / Self Care | Source: Ambulatory Visit | Attending: Surgery | Admitting: Surgery

## 2024-10-08 ENCOUNTER — Other Ambulatory Visit: Payer: Self-pay

## 2024-10-08 ENCOUNTER — Ambulatory Visit (HOSPITAL_COMMUNITY): Admitting: Anesthesiology

## 2024-10-08 ENCOUNTER — Ambulatory Visit (HOSPITAL_COMMUNITY): Payer: Self-pay | Admitting: Medical

## 2024-10-08 DIAGNOSIS — I10 Essential (primary) hypertension: Secondary | ICD-10-CM | POA: Insufficient documentation

## 2024-10-08 DIAGNOSIS — E0590 Thyrotoxicosis, unspecified without thyrotoxic crisis or storm: Secondary | ICD-10-CM | POA: Diagnosis present

## 2024-10-08 DIAGNOSIS — J398 Other specified diseases of upper respiratory tract: Secondary | ICD-10-CM | POA: Diagnosis not present

## 2024-10-08 DIAGNOSIS — E0520 Thyrotoxicosis with toxic multinodular goiter without thyrotoxic crisis or storm: Secondary | ICD-10-CM | POA: Diagnosis not present

## 2024-10-08 DIAGNOSIS — E042 Nontoxic multinodular goiter: Secondary | ICD-10-CM | POA: Diagnosis present

## 2024-10-08 DIAGNOSIS — Z01818 Encounter for other preprocedural examination: Secondary | ICD-10-CM

## 2024-10-08 DIAGNOSIS — E1165 Type 2 diabetes mellitus with hyperglycemia: Secondary | ICD-10-CM

## 2024-10-08 DIAGNOSIS — Z9009 Acquired absence of other part of head and neck: Secondary | ICD-10-CM | POA: Diagnosis present

## 2024-10-08 DIAGNOSIS — K219 Gastro-esophageal reflux disease without esophagitis: Secondary | ICD-10-CM | POA: Diagnosis not present

## 2024-10-08 DIAGNOSIS — Z7984 Long term (current) use of oral hypoglycemic drugs: Secondary | ICD-10-CM | POA: Diagnosis not present

## 2024-10-08 DIAGNOSIS — E119 Type 2 diabetes mellitus without complications: Secondary | ICD-10-CM | POA: Insufficient documentation

## 2024-10-08 HISTORY — PX: THYROIDECTOMY, COMPLETION: SHX7647

## 2024-10-08 LAB — GLUCOSE, CAPILLARY
Glucose-Capillary: 201 mg/dL — ABNORMAL HIGH (ref 70–99)
Glucose-Capillary: 105 mg/dL — ABNORMAL HIGH (ref 70–99)
Glucose-Capillary: 141 mg/dL — ABNORMAL HIGH (ref 70–99)
Glucose-Capillary: 199 mg/dL — ABNORMAL HIGH (ref 70–99)

## 2024-10-08 SURGERY — THYROIDECTOMY, COMPLETION
Anesthesia: General | Site: Neck

## 2024-10-08 MED ORDER — SUGAMMADEX SODIUM 200 MG/2ML IV SOLN
INTRAVENOUS | Status: DC | PRN
  Administered 2024-10-08: 200 mg via INTRAVENOUS

## 2024-10-08 MED ORDER — SODIUM CHLORIDE 0.45 % IV SOLN: INTRAVENOUS | Status: DC

## 2024-10-08 MED ORDER — AMLODIPINE BESYLATE 10 MG PO TABS
10.0000 mg | ORAL_TABLET | Freq: Every day | ORAL | Status: DC
  Administered 2024-10-09: 10 mg via ORAL
  Filled 2024-10-08: qty 1

## 2024-10-08 MED ORDER — OXYCODONE HCL 5 MG/5ML PO SOLN: 5.0000 mg | Freq: Once | ORAL | Status: DC | PRN

## 2024-10-08 MED ORDER — OXYCODONE HCL 5 MG PO TABS: 5.0000 mg | ORAL_TABLET | Freq: Once | ORAL | Status: DC | PRN

## 2024-10-08 MED ORDER — ONDANSETRON 4 MG PO TBDP: 4.0000 mg | ORAL_TABLET | Freq: Four times a day (QID) | ORAL | Status: DC | PRN

## 2024-10-08 MED ORDER — FENTANYL CITRATE (PF) 50 MCG/ML IJ SOSY
25.0000 ug | PREFILLED_SYRINGE | INTRAMUSCULAR | Status: DC | PRN
  Administered 2024-10-08 (×2): 50 ug via INTRAVENOUS

## 2024-10-08 MED ORDER — LACTATED RINGERS IV SOLN: INTRAVENOUS | Status: DC

## 2024-10-08 MED ORDER — HYDROMORPHONE HCL 1 MG/ML IJ SOLN: 1.0000 mg | INTRAMUSCULAR | Status: DC | PRN

## 2024-10-08 MED ORDER — CEFAZOLIN SODIUM-DEXTROSE 2-4 GM/100ML-% IV SOLN
2.0000 g | INTRAVENOUS | Status: AC
  Administered 2024-10-08: 2 g via INTRAVENOUS
  Filled 2024-10-08: qty 100

## 2024-10-08 MED ORDER — MIDAZOLAM HCL 5 MG/5ML IJ SOLN
INTRAMUSCULAR | Status: DC | PRN
  Administered 2024-10-08: 2 mg via INTRAVENOUS

## 2024-10-08 MED ORDER — HEMOSTATIC AGENTS (NO CHARGE) OPTIME
TOPICAL | Status: DC | PRN
  Administered 2024-10-08: 1 via TOPICAL

## 2024-10-08 MED ORDER — LIDOCAINE HCL (PF) 2 % IJ SOLN
INTRAMUSCULAR | Status: AC
  Filled 2024-10-08: qty 5

## 2024-10-08 MED ORDER — MIDAZOLAM HCL 2 MG/2ML IJ SOLN
INTRAMUSCULAR | Status: AC
  Filled 2024-10-08: qty 2

## 2024-10-08 MED ORDER — CHLORHEXIDINE GLUCONATE CLOTH 2 % EX PADS: 6.0000 | MEDICATED_PAD | Freq: Once | CUTANEOUS | Status: DC

## 2024-10-08 MED ORDER — ROCURONIUM BROMIDE 100 MG/10ML IV SOLN
INTRAVENOUS | Status: DC | PRN
  Administered 2024-10-08: 50 mg via INTRAVENOUS
  Administered 2024-10-08: 10 mg via INTRAVENOUS

## 2024-10-08 MED ORDER — TRAMADOL HCL 50 MG PO TABS: 50.0000 mg | ORAL_TABLET | Freq: Four times a day (QID) | ORAL | Status: DC | PRN

## 2024-10-08 MED ORDER — SUGAMMADEX SODIUM 200 MG/2ML IV SOLN
INTRAVENOUS | Status: AC
  Filled 2024-10-08: qty 2

## 2024-10-08 MED ORDER — LIDOCAINE HCL (CARDIAC) PF 100 MG/5ML IV SOSY
PREFILLED_SYRINGE | INTRAVENOUS | Status: DC | PRN
  Administered 2024-10-08: 60 mg via INTRAVENOUS

## 2024-10-08 MED ORDER — ACETAMINOPHEN 325 MG PO TABS: 650.0000 mg | ORAL_TABLET | Freq: Four times a day (QID) | ORAL | Status: DC | PRN

## 2024-10-08 MED ORDER — CALCIUM CARBONATE 1250 (500 CA) MG PO TABS
2.0000 | ORAL_TABLET | Freq: Three times a day (TID) | ORAL | Status: DC
  Administered 2024-10-08 – 2024-10-09 (×2): 2500 mg via ORAL
  Filled 2024-10-08 (×2): qty 2

## 2024-10-08 MED ORDER — FENTANYL CITRATE (PF) 100 MCG/2ML IJ SOLN
INTRAMUSCULAR | Status: AC
  Filled 2024-10-08: qty 2

## 2024-10-08 MED ORDER — CHLORHEXIDINE GLUCONATE 0.12 % MT SOLN
15.0000 mL | Freq: Once | OROMUCOSAL | Status: AC
  Administered 2024-10-08: 15 mL via OROMUCOSAL

## 2024-10-08 MED ORDER — FENTANYL CITRATE (PF) 50 MCG/ML IJ SOSY
PREFILLED_SYRINGE | INTRAMUSCULAR | Status: AC
  Filled 2024-10-08: qty 2

## 2024-10-08 MED ORDER — ONDANSETRON HCL 4 MG/2ML IJ SOLN
INTRAMUSCULAR | Status: DC | PRN
  Administered 2024-10-08: 4 mg via INTRAVENOUS

## 2024-10-08 MED ORDER — OXYCODONE HCL 5 MG PO TABS
5.0000 mg | ORAL_TABLET | Freq: Four times a day (QID) | ORAL | 0 refills | Status: AC | PRN
  Filled 2024-10-08: qty 15, 2d supply, fill #0

## 2024-10-08 MED ORDER — ORAL CARE MOUTH RINSE: 15.0000 mL | Freq: Once | OROMUCOSAL | Status: AC

## 2024-10-08 MED ORDER — FENTANYL CITRATE (PF) 100 MCG/2ML IJ SOLN
INTRAMUSCULAR | Status: DC | PRN
  Administered 2024-10-08 (×2): 50 ug via INTRAVENOUS

## 2024-10-08 MED ORDER — GLIMEPIRIDE 4 MG PO TABS
8.0000 mg | ORAL_TABLET | Freq: Every day | ORAL | Status: DC
  Administered 2024-10-09: 8 mg via ORAL
  Filled 2024-10-08: qty 2

## 2024-10-08 MED ORDER — EMPAGLIFLOZIN 25 MG PO TABS
25.0000 mg | ORAL_TABLET | Freq: Every day | ORAL | Status: DC
  Administered 2024-10-08 – 2024-10-09 (×2): 25 mg via ORAL
  Filled 2024-10-08 (×2): qty 1

## 2024-10-08 MED ORDER — ACETAMINOPHEN 650 MG RE SUPP: 650.0000 mg | Freq: Four times a day (QID) | RECTAL | Status: DC | PRN

## 2024-10-08 MED ORDER — ONDANSETRON HCL 4 MG/2ML IJ SOLN
4.0000 mg | Freq: Four times a day (QID) | INTRAMUSCULAR | Status: DC | PRN
  Administered 2024-10-08: 4 mg via INTRAVENOUS
  Filled 2024-10-08: qty 2

## 2024-10-08 MED ORDER — ACETAMINOPHEN 500 MG PO TABS
1000.0000 mg | ORAL_TABLET | Freq: Once | ORAL | Status: AC
  Administered 2024-10-08: 1000 mg via ORAL
  Filled 2024-10-08: qty 2

## 2024-10-08 MED ORDER — OXYCODONE HCL 5 MG PO TABS
5.0000 mg | ORAL_TABLET | ORAL | Status: DC | PRN
  Administered 2024-10-08 (×2): 5 mg via ORAL
  Filled 2024-10-08 (×2): qty 1

## 2024-10-08 MED ORDER — PROPOFOL 10 MG/ML IV BOLUS
INTRAVENOUS | Status: DC | PRN
  Administered 2024-10-08: 120 mg via INTRAVENOUS

## 2024-10-08 MED ORDER — METFORMIN HCL 500 MG PO TABS
1000.0000 mg | ORAL_TABLET | Freq: Two times a day (BID) | ORAL | Status: DC
  Administered 2024-10-08 – 2024-10-09 (×2): 1000 mg via ORAL
  Filled 2024-10-08 (×2): qty 2

## 2024-10-08 MED ORDER — 0.9 % SODIUM CHLORIDE (POUR BTL) OPTIME
TOPICAL | Status: DC | PRN
  Administered 2024-10-08: 1000 mL

## 2024-10-08 MED ORDER — INSULIN ASPART 100 UNIT/ML IJ SOLN
0.0000 [IU] | INTRAMUSCULAR | Status: DC | PRN
  Administered 2024-10-08: 4 [IU] via SUBCUTANEOUS
  Filled 2024-10-08: qty 4

## 2024-10-08 MED ORDER — ONDANSETRON HCL 4 MG/2ML IJ SOLN
INTRAMUSCULAR | Status: AC
  Filled 2024-10-08: qty 2

## 2024-10-08 MED ORDER — PHENYLEPHRINE 80 MCG/ML (10ML) SYRINGE FOR IV PUSH (FOR BLOOD PRESSURE SUPPORT)
PREFILLED_SYRINGE | INTRAVENOUS | Status: DC | PRN
  Administered 2024-10-08: 160 ug via INTRAVENOUS
  Administered 2024-10-08: 80 ug via INTRAVENOUS

## 2024-10-08 MED ORDER — AMISULPRIDE (ANTIEMETIC) 5 MG/2ML IV SOLN: 10.0000 mg | Freq: Once | INTRAVENOUS | Status: DC | PRN

## 2024-10-08 MED ORDER — ROCURONIUM BROMIDE 10 MG/ML (PF) SYRINGE
PREFILLED_SYRINGE | INTRAVENOUS | Status: AC
  Filled 2024-10-08: qty 10

## 2024-10-08 MED ORDER — PROPOFOL 10 MG/ML IV BOLUS
INTRAVENOUS | Status: AC
  Filled 2024-10-08: qty 20

## 2024-10-08 MED ORDER — DEXAMETHASONE SOD PHOSPHATE PF 10 MG/ML IJ SOLN
INTRAMUSCULAR | Status: DC | PRN
  Administered 2024-10-08: 4 mg via INTRAVENOUS

## 2024-10-08 SURGICAL SUPPLY — 27 items
BAG COUNTER SPONGE SURGICOUNT (BAG) ×1 IMPLANT
BLADE SURG 15 STRL LF DISP TIS (BLADE) ×1 IMPLANT
CHLORAPREP W/TINT 26 (MISCELLANEOUS) ×1 IMPLANT
CLIP TI MEDIUM 6 (CLIP) ×2 IMPLANT
CLIP TI WIDE RED SMALL 6 (CLIP) ×2 IMPLANT
COVER SURGICAL LIGHT HANDLE (MISCELLANEOUS) ×1 IMPLANT
DERMABOND ADVANCED .7 DNX12 (GAUZE/BANDAGES/DRESSINGS) ×1 IMPLANT
DRAPE LAPAROTOMY T 98X78 PEDS (DRAPES) ×1 IMPLANT
DRAPE UTILITY XL STRL (DRAPES) ×1 IMPLANT
ELECT PENCIL ROCKER SW 15FT (MISCELLANEOUS) ×1 IMPLANT
ELECT REM PT RETURN 15FT ADLT (MISCELLANEOUS) ×1 IMPLANT
GAUZE 4X4 16PLY ~~LOC~~+RFID DBL (SPONGE) ×1 IMPLANT
GLOVE SURG ORTHO 8.0 STRL STRW (GLOVE) ×1 IMPLANT
GOWN STRL REUS W/ TWL XL LVL3 (GOWN DISPOSABLE) ×2 IMPLANT
HEMOSTAT SURGICEL 2X4 FIBR (HEMOSTASIS) ×1 IMPLANT
ILLUMINATOR WAVEGUIDE N/F (MISCELLANEOUS) ×1 IMPLANT
KIT BASIN OR (CUSTOM PROCEDURE TRAY) ×1 IMPLANT
KIT TURNOVER KIT A (KITS) ×1 IMPLANT
PACK BASIC VI WITH GOWN DISP (CUSTOM PROCEDURE TRAY) ×1 IMPLANT
PAD MAGNETIC INSTR ST 16X20 (MISCELLANEOUS) ×1 IMPLANT
SHEARS HARMONIC 9CM CVD (BLADE) ×1 IMPLANT
SUT MNCRL AB 4-0 PS2 18 (SUTURE) ×1 IMPLANT
SUT SILK 3 0 SH 30 (SUTURE) ×1 IMPLANT
SUT VIC AB 3-0 SH 18 (SUTURE) ×2 IMPLANT
SYR BULB IRRIG 60ML STRL (SYRINGE) ×1 IMPLANT
TOWEL OR DSP ST BLU DLX 10/PK (DISPOSABLE) ×1 IMPLANT
TUBING CONNECTING 10 (TUBING) ×1 IMPLANT

## 2024-10-08 NOTE — Transfer of Care (Signed)
 Immediate Anesthesia Transfer of Care Note  Patient: Crystal [name]  Procedure(s) Performed: THYROIDECTOMY, COMPLETION (Neck)  Patient Location: PACU  Anesthesia Type:General  Level of Consciousness: awake, alert , and oriented  Airway & Oxygen Therapy: Patient Spontanous Breathing and Patient connected to face mask oxygen  Post-op Assessment: Report given to RN, Post -op Vital signs reviewed and stable, and Patient moving all extremities X 4  Post vital signs: Reviewed and stable  Last Vitals:  Vitals Value Taken Time  BP 188/73   Temp    Pulse 119 10/08/24 12:46  Resp 18 10/08/24 12:46  SpO2 100 % 10/08/24 12:46  Vitals shown include unfiled device data.  Last Pain:  Vitals:   10/08/24 0831  TempSrc: Oral         Complications: No notable events documented.

## 2024-10-08 NOTE — Interval H&P Note (Signed)
 History and Physical Interval Note:  10/08/2024 10:34 AM  Crystal [name]  has presented today for surgery, with the diagnosis of TOXIC MULTINODULAR GOITER.  The various methods of treatment have been discussed with the patient and family. After consideration of risks, benefits and other options for treatment, the patient has consented to    Procedures with comments: THYROIDECTOMY, COMPLETION (N/A) - COMPLETION THYROIDECTOMY RIGHT LOBE as a surgical intervention.    The patient's history has been reviewed, patient examined, no change in status, stable for surgery.  I have reviewed the patient's chart and labs.  Questions were answered to the patient's satisfaction.    Krystal Spinner, MD [facility] practice Office: [phone]   Krystal Spinner

## 2024-10-08 NOTE — Op Note (Signed)
 Operative Note  Pre-operative Diagnosis:  toxic multinodular goiter, tracheal deviation  Post-operative Diagnosis:  same  Surgeon:  Krystal Spinner, MD  Assistant:  none   Procedure:  Completion thyroidectomy (right thyroid  lobe and isthmus)  Anesthesia:  general  Estimated Blood Loss:  25 cc  Drains: none         Specimen: to pathology  Indications:  Patient is referred by Dr. [name] for surgical evaluation and management of a toxic multinodular goiter. Patient is from the [place]. She had undergone a left thyroid  lobectomy in 1992 in [place]. This was apparently for benign nodular disease. Patient has never been on thyroid  medication. Patient does note that she had loss of voice for approximately 3 months following that surgery. Her voice later recovered. Patient has now developed enlarging nodules in the right thyroid  lobe. The superior nodule measures 5 cm in size and the inferior nodule measures 3.6 cm in size. The patient has developed signs and symptoms of hyperthyroidism and has an undetectable TSH level. She has not responded well to methimazole . She is now referred for consideration for completion thyroidectomy for management of toxic nodular goiter. Patient denies any dysphagia. She denies dyspnea. She has had approximately a 10 pound weight loss over the past 2 months. Presents today to discuss proceeding with thyroid  surgery.   Procedure:  The patient was seen in the pre-op holding area. The risks, benefits, complications, treatment options, and expected outcomes were previously discussed with the patient. The patient agreed with the proposed plan and has signed the informed consent form.  The patient was brought to the operating room by the surgical team, identified as Crystal [name] and the procedure verified. A time out was completed and the above information confirmed.  Following administration of general anesthesia, the patient is positioned and then prepped and draped  in the usual aseptic fashion.  After ascertaining that an adequate level of anesthesia been achieved, a Kocher incision is made with a #15 blade.  Dissection is carried through subcutaneous tissues and platysma.  Hemostasis is achieved with the electrocautery.  Skin flaps are elevated cephalad and caudad from the thyroid  notch to the sternal notch.  A self-retaining retractors placed for exposure.  Strap muscles are incised in the midline.  There is a approximately 3 cm firm nodule in the thyroid  isthmus which extends across the midline.  Trachea and larynx are deviated to the left.  There is moderate scar tissue present.  Strap muscles are elevated off of the right thyroid  lobe.  There is some scarring from previous surgery.  Strap muscles are reflected laterally exposing the right thyroid  lobe.  Right lobe is moderately enlarged, firm, and somewhat fixed.  Strap muscles are reflected laterally and venous tributaries are divided between ligaclips with the harmonic scalpel.  Right lobe was gently mobilized.  Superior pole was carefully dissected out medially and laterally.  Superior pole vessels are divided between medium ligaclips with the harmonic scalpel.  Gland is rolled anteriorly.  Inferior venous tributaries are divided between medium ligaclips with the harmonic scalpel.  Branches of the inferior thyroid  artery are carefully dissected away from the thyroid  and divided between small ligaclips with the harmonic scalpel.  Recurrent laryngeal nerve is identified and preserved along its course.  Parathyroid tissue was identified and preserved.  Gland is rolled anteriorly.  Ligament of Court is released and the gland is mobilized onto the anterior trachea.  The large nodule in the thyroid  isthmus is dissected off of the  underlying trachea using the electrocautery for hemostasis.  The entire right lobe and isthmus is resected.  Suture is used to mark the isthmus margin.  The gland is submitted to pathology for  review.  Neck is irrigated with warm saline.  Good hemostasis is obtained throughout the operative field.  Fibrillar was placed throughout the operative field.  Strap muscles are reapproximated in the midline with interrupted 3-0 Vicryl sutures.  Platysma was closed with interrupted 3-0 Vicryl sutures.  Skin was closed with a running 4-0 Monocryl subcuticular suture.  Wound was washed and dried and Dermabond is applied as dressing.  Patient is awakened from anesthesia and transferred to the recovery room in stable condition.  Patient tolerated the procedure well.   Krystal Spinner, MD [facility] Surgery Office: [phone]

## 2024-10-08 NOTE — Discharge Instructions (Addendum)

## 2024-10-08 NOTE — Plan of Care (Signed)

## 2024-10-08 NOTE — Anesthesia Postprocedure Evaluation (Signed)
"   Anesthesia Post Note  Patient: Crystal [name]  Procedure(s) Performed: THYROIDECTOMY, COMPLETION (Neck)     Patient location during evaluation: PACU Anesthesia Type: General Level of consciousness: awake and alert Pain management: pain level controlled Vital Signs Assessment: post-procedure vital signs reviewed and stable Respiratory status: spontaneous breathing, nonlabored ventilation, respiratory function stable and patient connected to nasal cannula oxygen Cardiovascular status: blood pressure returned to baseline and stable Postop Assessment: no apparent nausea or vomiting Anesthetic complications: no   No notable events documented.  Last Vitals:  Vitals:   10/08/24 1315 10/08/24 1330  BP: (!) 172/73 (!) 165/73  Pulse: (!) 109 (!) 111  Resp: 14 20  Temp:  37 C  SpO2: 98% 96%    Last Pain:  Vitals:   10/08/24 1330  TempSrc:   PainSc: Asleep                 [name]      "

## 2024-10-08 NOTE — Anesthesia Procedure Notes (Signed)
 Procedure Name: Intubation Date/Time: 10/08/2024 11:07 AM  Performed by: Rudolpho Fonda SAUNDERS, CRNAPre-anesthesia Checklist: Patient identified, Emergency Drugs available, Suction available, Patient being monitored and Timeout performed Patient Re-evaluated:Patient Re-evaluated prior to induction Oxygen Delivery Method: Circle system utilized Preoxygenation: Pre-oxygenation with 100% oxygen Induction Type: IV induction Ventilation: Mask ventilation without difficulty Laryngoscope Size: Mac and 4 Grade View: Grade II Tube type: Oral Tube size: 7.0 mm Number of attempts: 1 Airway Equipment and Method: Stylet Placement Confirmation: ETT inserted through vocal cords under direct vision, positive ETCO2 and breath sounds checked- equal and bilateral Secured at: 21 cm Tube secured with: Tape Dental Injury: Teeth and Oropharynx as per pre-operative assessment  Comments: Grade 2 with downward laryngeal pressure

## 2024-10-09 ENCOUNTER — Encounter (HOSPITAL_COMMUNITY): Payer: Self-pay | Admitting: Surgery

## 2024-10-09 ENCOUNTER — Other Ambulatory Visit (HOSPITAL_COMMUNITY): Payer: Self-pay

## 2024-10-09 DIAGNOSIS — E0520 Thyrotoxicosis with toxic multinodular goiter without thyrotoxic crisis or storm: Secondary | ICD-10-CM | POA: Diagnosis not present

## 2024-10-09 LAB — CALCIUM: Calcium: 9.6 mg/dL (ref 8.9–10.3)

## 2024-10-09 NOTE — Discharge Summary (Signed)
 Physician Discharge Summary  Dawnita Molner Rettig FMW:[id number] DOB: [birth year] DOA: 10/08/2024  PCP: Oley Bascom RAMAN, NP  Admit date: 10/08/2024 Discharge date: 10/09/2024  Recommendations for Outpatient Follow-up:     Follow-up Information     Eletha Boas, MD. Schedule an appointment as soon as possible for a visit in 3 week(s).   Specialty: General Surgery Why: For wound re-check Contact information: [address] [place] [address] [phone]                Discharge Diagnoses:  DM2 GERD HTN Toxic multinodular goiter  Surgical Procedure: completion thryoidectomy (right thyroid  lobe and isthmus) 10/08/24, Dr Eletha  Discharge Condition: good Disposition: home  Diet recommendation: regular  Filed Weights   10/08/24 0831 10/08/24 0851  Weight: 64 kg 64 kg    History of present illness:  Patient is referred by Dr. Komal Motwani for surgical evaluation and management of a toxic multinodular goiter. Patient is from the [place]. She had undergone a left thyroid  lobectomy in 1992 in [place]. This was apparently for benign nodular disease. Patient has never been on thyroid  medication. Patient does note that she had loss of voice for approximately 3 months following that surgery. Her voice later recovered. Patient has now developed enlarging nodules in the right thyroid  lobe. The superior nodule measures 5 cm in size and the inferior nodule measures 3.6 cm in size. The patient has developed signs and symptoms of hyperthyroidism and has an undetectable TSH level. She has not responded well to methimazole . She is now referred for consideration for completion thyroidectomy for management of toxic nodular goiter. Patient denies any dysphagia. She denies dyspnea. She has had approximately a 10 pound weight loss over the past 2 months. Presents today to discuss proceeding with thyroid  surgery.   Hospital Course:  Pt underwent above procedure. Kept overnight for  observation. On pod 1 tolerating a diet, reports voice normal, no significant pain or sore throat, no perioral numbness/tingling. Pt was ambulating with stable vitals. Morning Calcium  level ok. Deemed stable for discharge.   Discussed dc instructions  BP (!) 146/66 (BP Location: Right Arm)   Pulse 97   Temp 98.8 F (37.1 C) (Oral)   Resp 18   Ht 5' 5 (1.651 m)   Wt 64 kg   LMP 06/02/2013   SpO2 97%   BMI 23.46 kg/m   Gen: alert, NAD, non-toxic appearing Pupils: equal, no scleral icterus Pulm:  symmetric chest rise Neck: incision c/d/I, no hematoma CV: regular rate and rhythm Ext: no edema, no calf tenderness Skin: no rash, no jaundice    Discharge Instructions  Discharge Instructions     Call MD for:   Complete by: As directed    Temperature >101   Call MD for:  hives   Complete by: As directed    Call MD for:  persistant dizziness or light-headedness   Complete by: As directed    Call MD for:  persistant nausea and vomiting   Complete by: As directed    Call MD for:  redness, tenderness, or signs of infection (pain, swelling, redness, odor or green/yellow discharge around incision site)   Complete by: As directed    Call MD for:  severe uncontrolled pain   Complete by: As directed    Diet general   Complete by: As directed    Discharge instructions   Complete by: As directed    See [facility] discharge instructions   Increase activity slowly  Complete by: As directed       Allergies as of 10/09/2024       Reactions   Aspirin Nausea And Vomiting   Porcine (pork) Protein-containing Drug Products    RELIGIOUS REASONS, NO pork products   Tramadol     Upset stomach         Medication List     STOP taking these medications    Ciclopirox  1 % shampoo   glimepiride  4 MG tablet Commonly known as: AMARYL    sitaGLIPtin  100 MG tablet Commonly known as: Januvia        TAKE these medications    acetaminophen  500 MG tablet Commonly known as: TYLENOL  Take  1,000 mg by mouth daily as needed for moderate pain.   amLODipine  10 MG tablet Commonly known as: NORVASC  Take 1 tablet (10 mg total) by mouth daily.   blood glucose meter kit and supplies One Touch Verio. Use up to four times daily as directed. (FOR ICD-10 E10.9, E11.9).   Blood Pressure Monitor Devi Use as directed to monitor blood pressure once daily. Keep a log of your blood pressure readings.   cetirizine  10 MG tablet Commonly known as: ZyrTEC  Allergy Take 1 tablet (10 mg total) by mouth at bedtime.   diclofenac  Sodium 1 % Gel Commonly known as: Voltaren  Arthritis Pain Apply 2 grams topically 4 (four) times daily.   fluocinonide  ointment 0.05 % Commonly known as: LIDEX  Apply daily to scalp once daily for 2 weeks. Then for maintenance, use 2-3 times weekly.   fluticasone  50 MCG/ACT nasal spray Commonly known as: FLONASE  Place 1 spray into both nostrils 2 (two) times daily as needed (nasal congestion).   hydroquinone  4 % cream Apply a thin layer to dark circles around eyes nightly for 2-3 months. Then stop for 1-2 months. If discoloration recurs may restart cycle.   Jardiance  25 MG Tabs tablet Generic drug: empagliflozin  Take 1 tablet (25 mg total) by mouth daily.   lidocaine  5 % Commonly known as: Lidoderm  Place 1 patch onto the skin daily. Remove & Discard patch within 12 hours or as directed by MD What changed:  when to take this reasons to take this   metFORMIN  500 MG tablet Commonly known as: GLUCOPHAGE  Take 2 tablets (1,000 mg total) by mouth 2 (two) times daily with a meal.   onetouch ultrasoft lancets Use as instructed   oxyCODONE  5 MG immediate release tablet Commonly known as: Oxy IR/ROXICODONE  Take 1-2 tablets (5-10 mg total) by mouth every 6 (six) hours as needed for moderate pain (pain score 4-6).   pantoprazole  40 MG tablet Commonly known as: PROTONIX  Take 1 tablet (40 mg total) by mouth daily. What changed:  when to take this reasons to take  this   Salicylic Acid  6 % Sham Apply 1 Application topically daily as needed.        Follow-up Information     Eletha Boas, MD. Schedule an appointment as soon as possible for a visit in 3 week(s).   Specialty: General Surgery Why: For wound re-check Contact information: [address] [place] [address] [phone]                  The results of significant diagnostics from this hospitalization (including imaging, microbiology, ancillary and laboratory) are listed below for reference.    Significant Diagnostic Studies: No results found.  Microbiology: No results found for this or any previous visit (from the past 240 hours).   Labs: Basic Metabolic  Panel: Recent Labs  Lab 10/09/24 0515  CALCIUM  9.6     CBG: Recent Labs  Lab 10/08/24 0837 10/08/24 1110 10/08/24 1248 10/08/24 1437  GLUCAP 201* 105* 141* 199*    Principal Problem:   Multiple thyroid  nodules Active Problems:   Hyperthyroidism   Toxic multinodular goiter   History of lobectomy of thyroid    Time coordinating discharge: 15 min  Signed:  Camellia CHRISTELLA Blush, MD [facility] [phone] 10/09/2024, 9:15 AM

## 2024-10-09 NOTE — Progress Notes (Signed)
 Patient discharged home, IV removed, discharge paperwork provided and explained to patient as well as patient's son, both patient and patient's son verbalized understanding.

## 2024-10-09 NOTE — Progress Notes (Signed)
 Discharge meds in a secure bag delivered to patient by this RN

## 2024-10-11 ENCOUNTER — Other Ambulatory Visit (HOSPITAL_COMMUNITY): Payer: Self-pay

## 2024-10-11 ENCOUNTER — Ambulatory Visit

## 2024-10-11 ENCOUNTER — Other Ambulatory Visit: Payer: Self-pay | Admitting: "Endocrinology

## 2024-10-11 DIAGNOSIS — E890 Postprocedural hypothyroidism: Secondary | ICD-10-CM

## 2024-10-11 LAB — SURGICAL PATHOLOGY

## 2024-10-11 MED ORDER — LEVOTHYROXINE SODIUM 100 MCG PO TABS
100.0000 ug | ORAL_TABLET | Freq: Every day | ORAL | 3 refills | Status: AC
  Filled 2024-10-11 (×2): qty 90, 90d supply, fill #0

## 2024-10-12 ENCOUNTER — Other Ambulatory Visit: Payer: Self-pay

## 2024-10-13 ENCOUNTER — Ambulatory Visit

## 2024-10-19 ENCOUNTER — Ambulatory Visit

## 2024-10-21 ENCOUNTER — Ambulatory Visit: Admitting: Physical Therapy

## 2024-10-25 ENCOUNTER — Telehealth (HOSPITAL_COMMUNITY): Payer: Self-pay

## 2024-10-25 ENCOUNTER — Other Ambulatory Visit (HOSPITAL_COMMUNITY): Payer: Self-pay

## 2024-10-25 ENCOUNTER — Telehealth: Payer: Self-pay

## 2024-10-25 ENCOUNTER — Encounter: Payer: Self-pay | Admitting: "Endocrinology

## 2024-10-25 ENCOUNTER — Ambulatory Visit: Admitting: "Endocrinology

## 2024-10-25 VITALS — BP 130/80 | HR 86 | Ht 65.0 in | Wt 147.0 lb

## 2024-10-25 DIAGNOSIS — E1165 Type 2 diabetes mellitus with hyperglycemia: Secondary | ICD-10-CM | POA: Diagnosis not present

## 2024-10-25 DIAGNOSIS — Z8639 Personal history of other endocrine, nutritional and metabolic disease: Secondary | ICD-10-CM | POA: Diagnosis not present

## 2024-10-25 DIAGNOSIS — Z7984 Long term (current) use of oral hypoglycemic drugs: Secondary | ICD-10-CM

## 2024-10-25 DIAGNOSIS — E7800 Pure hypercholesterolemia, unspecified: Secondary | ICD-10-CM

## 2024-10-25 DIAGNOSIS — E890 Postprocedural hypothyroidism: Secondary | ICD-10-CM

## 2024-10-25 MED ORDER — FREESTYLE LIBRE 3 PLUS SENSOR MISC
3 refills | Status: AC
  Filled 2024-10-25: qty 2, 30d supply, fill #0

## 2024-10-25 MED ORDER — DEXCOM G7 15 DAY SENSOR MISC
1.0000 | 3 refills | Status: AC
  Filled 2024-10-25: qty 2, 30d supply, fill #0

## 2024-10-25 NOTE — Patient Instructions (Signed)

## 2024-10-25 NOTE — Telephone Encounter (Signed)
 Pharmacy Patient Advocate Encounter   Received notification from Pt Calls Messages that prior authorization for Freestyle libre 3 plus sensor is required/requested.   To help increase the likelihood of successful prior authorization for a continuous glucose monitor, please review the documentation and include evidence of at least two hypoglycemic events (insurers often require glucose values <=54 mg/dL) and/or clear documentation that the patient relies on insulin  to adequately control their diabetes

## 2024-10-25 NOTE — Progress Notes (Signed)
 "   Outpatient Endocrinology Note [name], MD    Crystal [name] January 20, 1961 [id number]  Referring Provider: Oley Bascom RAMAN, NP Primary Care Provider: Oley Bascom RAMAN, NP Reason for consultation: Subjective   Assessment & Plan  Diagnoses and all orders for this visit:  Postoperative hypothyroidism -     Lipid panel -     TSH -     T4, free -     Comprehensive metabolic panel with GFR -     Microalbumin / creatinine urine ratio  History of Graves' disease  Type 2 diabetes mellitus with hyperglycemia, without long-term current use of insulin  (HCC) -     Continuous Glucose Sensor (DEXCOM G7 15 DAY SENSOR) MISC; 1 Device by Does not apply route continuous. -     Continuous Glucose Sensor (FREESTYLE LIBRE 3 PLUS SENSOR) MISC; Change sensor every 15 days.  Pure hypercholesterolemia    History of hyperthyroidism, was prescribed methimazole  10 mg but patient did not start 12/2023 TSI -ve, TRAb + On methimazole  20 mg tid, denies any side effects; recommended to increase it to 20 mg in the morning, 10 mg in the afternoon and 20 mg in the evening 08/18/24: Ordered referral for total thyroidectomy given history of thyroid  nodules and uncontrolled Graves' disease on high dose of methimazole  per patient preference, patient is not interested in radioactive iodine 10/08/24 s/p total thyroidectomy by Krystal Spinner, stopped methimazole , on levothyroxine  100 mcg po every day Recommend to take levothyroxine  first thing in the morning on empty stomach and wait at least 30 minutes to 1 hour before eating or drinking anything or taking any other medications. Space out levothyroxine  by 4 hours from any acid reflux medication, fibrate, iron, calcium , multivitamin, birth control pills and nutritional supplements.   Prominent goiter, especially on the right side noted S/p L thyroidectomy in 1993   Patient reports that she was was previously recommended thyroidectomy but could not afford  it 12/2023 thyroid  ultrasound: per report: Stable size of right superior and inferior thyroid  nodules. These nodules are stable since the prior sonogram 4 years ago. Both nodules have been previously biopsied with negative cytology. Status post left thyroidectomy. Confirmed that patient had FNA in 2014 per out records on right sided nodules 10/08/24 s/p total thyroidectomy by Krystal Spinner,   Type 2 diabetes with hyperglycemia 12/2023 C-peptide positive, MA/Cr negative Currently on metformin  500 mg 2 pills twice daily and glimepiride  4 mg 2 pills Qam  10/25/24: Cut down glimepiride  to 4 mg every day to avoid hypoglycemia; continue metformin  the same. Ordered DexCom and libre 3 + to pick one. Bring log every visit  Her PCP stopped Januvia  100 mg every day to attempt GLP-1? but patient did not like it, so she is not taking that shot due to GI side effects Previously, discussed with patient the need to start insulin  given current A1c of 9.6 but patient is absolutely against insulin  Previously, ordered diabetes education Previously on Jardiance  25 mg and januvia  100mg  qd Patient wants to try lifestyle changes before she would consider insulin   12/2023 Hypercholesterolemia with LDL of 132 Patient is NOT on simvastatin  20 mg daily. Had elevated LFTs in the past, currently normal Patient wants to repeat lipids because starting statin if needed    Return in about 4 weeks (around 11/22/2024) for Jan 30th 7:45 am lab appointment .   I have reviewed current medications, nurse's notes, allergies, vital signs, past medical and surgical history, family medical history, and social history  for this encounter. Counseled patient on symptoms, examination findings, lab findings, imaging results, treatment decisions and monitoring and prognosis. The patient understood the recommendations and agrees with the treatment plan. All questions regarding treatment plan were fully answered.  [name], MD   10/25/24   History of Present Illness HPI  Crystal [name] is a 64 y.o. year old female who presents for follow up of diabetes type II and hyperthyroidism.  Reports weight gain, hair fall is stable  Denies constipation/hyperdefecation  Denies cold/heat intolerance Denies hyperdefecation/palpitations No dysphagia/dyspnea/dysphonia   Checks BG bid, 66-150s per recall  Diagnosed on DM2 around 2013 Denies MI/stroke, neuropathy, retinopathy, nephropathy Did not bring meter, checking tid  Bg 130-185 per recall Not taking Simvastatin  20 mg every day  Previously on Jardiance  25 mg and januvia  100mg  every day Currently on metformin  500 mg 2 pills twice daily and glimepiride  4 mg 2 pills Qam  Physical Exam  BP 130/80   Pulse 86   Ht 5' 5 (1.651 m)   Wt 147 lb (66.7 kg)   LMP 06/02/2013   SpO2 96%   BMI 24.46 kg/m    Constitutional: well developed, well nourished Head: normocephalic, atraumatic Eyes: sclera anicteric, no redness Neck: supple, + non-tender thyroidectomy scar healing with ?keloid  Lungs: normal respiratory effort Neurology: alert and oriented Skin: dry, no appreciable rashes Musculoskeletal: no appreciable defects Psychiatric: normal mood and affect   Current Medications Patient's Medications  New Prescriptions   CONTINUOUS GLUCOSE SENSOR (DEXCOM G7 15 DAY SENSOR) MISC    1 Device by Does not apply route continuous.   CONTINUOUS GLUCOSE SENSOR (FREESTYLE LIBRE 3 PLUS SENSOR) MISC    Change sensor every 15 days.  Previous Medications   ACETAMINOPHEN  (TYLENOL ) 500 MG TABLET    Take 1,000 mg by mouth daily as needed for moderate pain.    AMLODIPINE  (NORVASC ) 10 MG TABLET    Take 1 tablet (10 mg total) by mouth daily.   BLOOD GLUCOSE METER KIT AND SUPPLIES    One Touch Verio. Use up to four times daily as directed. (FOR ICD-10 E10.9, E11.9).   BLOOD PRESSURE MONITOR DEVI    Use as directed to monitor blood pressure once daily. Keep a log of your blood  pressure readings.   CETIRIZINE  (ZYRTEC  ALLERGY) 10 MG TABLET    Take 1 tablet (10 mg total) by mouth at bedtime.   DICLOFENAC  SODIUM (VOLTAREN  ARTHRITIS PAIN) 1 % GEL    Apply 2 grams topically 4 (four) times daily.   EMPAGLIFLOZIN  (JARDIANCE ) 25 MG TABS TABLET    Take 1 tablet (25 mg total) by mouth daily.   FLUOCINONIDE  OINTMENT (LIDEX ) 0.05 %    Apply daily to scalp once daily for 2 weeks. Then for maintenance, use 2-3 times weekly.   FLUTICASONE  (FLONASE ) 50 MCG/ACT NASAL SPRAY    Place 1 spray into both nostrils 2 (two) times daily as needed (nasal congestion).   HYDROQUINONE  4 % CREAM    Apply a thin layer to dark circles around eyes nightly for 2-3 months. Then stop for 1-2 months. If discoloration recurs may restart cycle.   LANCETS (ONETOUCH ULTRASOFT) LANCETS    Use as instructed   LEVOTHYROXINE  (SYNTHROID ) 100 MCG TABLET    Take 1 tablet (100 mcg total) by mouth daily.   LIDOCAINE  (LIDODERM ) 5 %    Place 1 patch onto the skin daily. Remove & Discard patch within 12 hours or as directed by MD   METFORMIN  (GLUCOPHAGE ) 500 MG  TABLET    Take 2 tablets (1,000 mg total) by mouth 2 (two) times daily with a meal.   OXYCODONE  (OXY IR/ROXICODONE ) 5 MG IMMEDIATE RELEASE TABLET    Take 1-2 tablets (5-10 mg total) by mouth every 6 (six) hours as needed for moderate pain (pain score 4-6).   PANTOPRAZOLE  (PROTONIX ) 40 MG TABLET    Take 1 tablet (40 mg total) by mouth daily.   SALICYLIC ACID  6 % SHAM    Apply 1 Application topically daily as needed.  Modified Medications   No medications on file  Discontinued Medications   No medications on file    Allergies Allergies  Allergen Reactions   Aspirin Nausea And Vomiting   Porcine (Pork) Protein-Containing Drug Products     RELIGIOUS REASONS, NO pork products   Tramadol      Upset stomach     Past Medical History Past Medical History:  Diagnosis Date   Allergy    Arthritis    Diabetes mellitus    Dysrhythmia    tachycardia at times    GERD (gastroesophageal reflux disease)    Hyperlipidemia    Hypertension    Hyperthyroidism    Recurrent upper respiratory infection (URI)     Past Surgical History Past Surgical History:  Procedure Laterality Date   ANTERIOR CERVICAL DECOMP/DISCECTOMY FUSION N/A 10/11/2016   Procedure: ANTERIOR CERVICAL DISCECTOMY FUSION C6-7 WITH EXCISION OF OSTEOPHYTE;  Surgeon: Lynwood FORBES Better, MD;  Location: [facility] OR;  Service: Orthopedics;  Laterality: N/A;   CESAREAN SECTION     x1   THYROIDECTOMY, COMPLETION N/A 10/08/2024   Procedure: THYROIDECTOMY, COMPLETION;  Surgeon: Eletha Boas, MD;  Location: [facility];  Service: General;  Laterality: N/A;  COMPLETION THYROIDECTOMY RIGHT LOBE   THYROIDECTOMY, PARTIAL Left 1992   in [place],   TUBAL LIGATION      Family History family history is not on file.  Social History Social History   Socioeconomic History   Marital status: Married    Spouse name: Not on file   Number of children: Not on file   Years of education: Not on file   Highest education level: Not on file  Occupational History   Not on file  Tobacco Use   Smoking status: Never   Smokeless tobacco: Never  Vaping Use   Vaping status: Never Used  Substance and Sexual Activity   Alcohol use: No   Drug use: No   Sexual activity: Not Currently  Other Topics Concern   Not on file  Social History Narrative   Not on file   Social Drivers of Health   Tobacco Use: Low Risk (10/25/2024)   Patient History    Smoking Tobacco Use: Never    Smokeless Tobacco Use: Never    Passive Exposure: Not on file  Financial Resource Strain: Not on file  Food Insecurity: No Food Insecurity (10/08/2024)   Epic    Worried About Programme Researcher, Broadcasting/film/video in the Last Year: Never true    Ran Out of Food in the Last Year: Never true  Transportation Needs: No Transportation Needs (10/08/2024)   Epic    Lack of Transportation (Medical): No    Lack of Transportation (Non-Medical): No  Physical Activity: Not on file   Stress: Not on file  Social Connections: Not on file  Intimate Partner Violence: Not At Risk (10/08/2024)   Epic    Fear of Current or Ex-Partner: No    Emotionally Abused: No    Physically Abused: No  Sexually Abused: No  Depression (PHQ2-9): Low Risk (02/06/2024)   Depression (PHQ2-9)    PHQ-2 Score: 0  Alcohol Screen: Not on file  Housing: Low Risk (10/08/2024)   Epic    Unable to Pay for Housing in the Last Year: No    Number of Times Moved in the Last Year: 0    Homeless in the Last Year: No  Utilities: Not At Risk (10/08/2024)   Epic    Threatened with loss of utilities: No  Health Literacy: Not on file    Lab Results  Component Value Date   CHOL 208 (H) 12/15/2023   Lab Results  Component Value Date   HDL 55 12/15/2023   Lab Results  Component Value Date   LDLCALC 132 (H) 12/15/2023   Lab Results  Component Value Date   TRIG 106 12/15/2023   Lab Results  Component Value Date   CHOLHDL 3.8 12/15/2023   Lab Results  Component Value Date   CREATININE 0.33 (L) 09/28/2024   Lab Results  Component Value Date   GFR 204.34 12/15/2019      Component Value Date/Time   NA 139 09/28/2024 1143   NA 141 10/14/2023 1423   K 4.3 09/28/2024 1143   CL 101 09/28/2024 1143   CO2 23 09/28/2024 1143   GLUCOSE 189 (H) 09/28/2024 1143   BUN 13 09/28/2024 1143   BUN 10 10/14/2023 1423   CREATININE 0.33 (L) 09/28/2024 1143   CREATININE 0.31 (L) 12/15/2023 0923   CALCIUM  9.6 10/09/2024 0515   PROT 6.8 12/15/2023 0923   PROT 6.8 10/14/2023 1423   ALBUMIN 4.0 10/14/2023 1423   AST 15 12/15/2023 0923   ALT 28 12/15/2023 0923   ALKPHOS 133 (H) 10/14/2023 1423   BILITOT 0.5 12/15/2023 0923   BILITOT 0.3 10/14/2023 1423   GFRNONAA >60 09/28/2024 1143   GFRNONAA 117 08/14/2017 1542   GFRAA 131 08/11/2020 0950   GFRAA 136 08/14/2017 1542      Latest Ref Rng & Units 10/09/2024    5:15 AM 09/28/2024   11:43 AM 12/15/2023    9:23 AM  BMP  Glucose 70 - 99 mg/dL  810  765    BUN 8 - 23 mg/dL  13  12   Creatinine 9.55 - 1.00 mg/dL  9.66  9.68   BUN/Creat Ratio 6 - 22 (calc)   39   Sodium 135 - 145 mmol/L  139  139   Potassium 3.5 - 5.1 mmol/L  4.3  4.6   Chloride 98 - 111 mmol/L  101  102   CO2 22 - 32 mmol/L  23  26   Calcium  8.9 - 10.3 mg/dL 9.6  89.9  9.4        Component Value Date/Time   WBC 8.2 09/28/2024 1143   RBC 5.86 (H) 09/28/2024 1143   HGB 13.8 09/28/2024 1143   HGB 12.5 10/14/2023 1423   HCT 46.3 (H) 09/28/2024 1143   HCT 41.5 10/14/2023 1423   PLT 326 09/28/2024 1143   PLT 411 10/14/2023 1423   MCV 79.0 (L) 09/28/2024 1143   MCV 76 (L) 10/14/2023 1423   MCH 23.5 (L) 09/28/2024 1143   MCHC 29.8 (L) 09/28/2024 1143   RDW 14.9 09/28/2024 1143   RDW 13.6 10/14/2023 1423   LYMPHSABS 2.7 09/26/2021 1547   MONOABS 0.5 12/15/2019 1558   EOSABS 0.1 09/26/2021 1547   BASOSABS 0.0 09/26/2021 1547   Lab Results  Component Value Date   TSH  0.01 (L) 08/11/2024   TSH <0.01 (L) 03/02/2024   TSH <0.01 (L) 01/30/2024   FREET4 2.5 (H) 08/11/2024   FREET4 2.0 (H) 03/02/2024   FREET4 2.7 (H) 01/30/2024         Parts of this note may have been dictated using voice recognition software. There may be variances in spelling and vocabulary which are unintentional. Not all errors are proofread. Please notify the dino if any discrepancies are noted or if the meaning of any statement is not clear.   "

## 2024-10-26 ENCOUNTER — Other Ambulatory Visit (HOSPITAL_COMMUNITY): Payer: Self-pay

## 2024-10-27 ENCOUNTER — Other Ambulatory Visit (HOSPITAL_COMMUNITY): Payer: Self-pay

## 2024-10-27 ENCOUNTER — Telehealth: Payer: Self-pay

## 2024-10-27 ENCOUNTER — Ambulatory Visit

## 2024-10-27 NOTE — Telephone Encounter (Signed)
 I spoke with patient after missed appt today. She would not like to resume PT at this time, as she is recovering from thyroidectomy procedure. She states that she doesn't want to start PT until April. Informed patient that she will need to obtain new MD referral at that time. We will be happy to evaluate her knees when she has recovered at is ready to begin PT - MJ

## 2024-10-29 ENCOUNTER — Other Ambulatory Visit (HOSPITAL_COMMUNITY): Payer: Self-pay

## 2024-10-29 ENCOUNTER — Emergency Department (HOSPITAL_COMMUNITY)
Admission: EM | Admit: 2024-10-29 | Discharge: 2024-10-29 | Disposition: A | Discharge status: Home / Self Care | Attending: Emergency Medicine | Admitting: Emergency Medicine

## 2024-10-29 DIAGNOSIS — I10 Essential (primary) hypertension: Secondary | ICD-10-CM | POA: Insufficient documentation

## 2024-10-29 DIAGNOSIS — N3090 Cystitis, unspecified without hematuria: Secondary | ICD-10-CM | POA: Insufficient documentation

## 2024-10-29 DIAGNOSIS — Z79899 Other long term (current) drug therapy: Secondary | ICD-10-CM | POA: Diagnosis not present

## 2024-10-29 DIAGNOSIS — Z7984 Long term (current) use of oral hypoglycemic drugs: Secondary | ICD-10-CM | POA: Diagnosis not present

## 2024-10-29 DIAGNOSIS — E119 Type 2 diabetes mellitus without complications: Secondary | ICD-10-CM | POA: Insufficient documentation

## 2024-10-29 DIAGNOSIS — R300 Dysuria: Secondary | ICD-10-CM | POA: Diagnosis present

## 2024-10-29 LAB — CBC WITH DIFFERENTIAL/PLATELET
Abs Immature Granulocytes: 0.04 K/uL (ref 0.00–0.07)
Basophils Absolute: 0.1 K/uL (ref 0.0–0.1)
Basophils Relative: 1 %
Eosinophils Absolute: 0.1 K/uL (ref 0.0–0.5)
Eosinophils Relative: 2 %
HCT: 45.8 % (ref 36.0–46.0)
Hemoglobin: 13.4 g/dL (ref 12.0–15.0)
Immature Granulocytes: 0 %
Lymphocytes Relative: 30 %
Lymphs Abs: 2.7 K/uL (ref 0.7–4.0)
MCH: 23.1 pg — ABNORMAL LOW (ref 26.0–34.0)
MCHC: 29.3 g/dL — ABNORMAL LOW (ref 30.0–36.0)
MCV: 79 fL — ABNORMAL LOW (ref 80.0–100.0)
Monocytes Absolute: 0.5 K/uL (ref 0.1–1.0)
Monocytes Relative: 6 %
Neutro Abs: 5.5 K/uL (ref 1.7–7.7)
Neutrophils Relative %: 61 %
Platelets: 484 K/uL — ABNORMAL HIGH (ref 150–400)
RBC: 5.8 MIL/uL — ABNORMAL HIGH (ref 3.87–5.11)
RDW: 15.1 % (ref 11.5–15.5)
WBC: 9.1 K/uL (ref 4.0–10.5)
nRBC: 0 % (ref 0.0–0.2)

## 2024-10-29 LAB — URINALYSIS, W/ REFLEX TO CULTURE (INFECTION SUSPECTED)
Bilirubin Urine: NEGATIVE
Glucose, UA: >=500 mg/dL — AB
Ketones, ur: NEGATIVE mg/dL
Nitrite: NEGATIVE
Protein, ur: NEGATIVE mg/dL
Specific Gravity, Urine: 1.027 (ref 1.005–1.030)
pH: 5 (ref 5.0–8.0)

## 2024-10-29 LAB — COMPREHENSIVE METABOLIC PANEL WITH GFR
ALT: 17 U/L (ref 0–44)
AST: 20 U/L (ref 15–41)
Albumin: 4.2 g/dL (ref 3.5–5.0)
Alkaline Phosphatase: 160 U/L — ABNORMAL HIGH (ref 38–126)
Anion gap: 11 (ref 5–15)
BUN: 14 mg/dL (ref 8–23)
CO2: 25 mmol/L (ref 22–32)
Calcium: 9.3 mg/dL (ref 8.9–10.3)
Chloride: 102 mmol/L (ref 98–111)
Creatinine, Ser: 0.43 mg/dL — ABNORMAL LOW (ref 0.44–1.00)
GFR, Estimated: >60 mL/min
Glucose, Bld: 208 mg/dL — ABNORMAL HIGH (ref 70–99)
Potassium: 4 mmol/L (ref 3.5–5.1)
Sodium: 138 mmol/L (ref 135–145)
Total Bilirubin: 0.3 mg/dL (ref 0.0–1.2)
Total Protein: 7.6 g/dL (ref 6.5–8.1)

## 2024-10-29 LAB — LIPASE, BLOOD: Lipase: 36 U/L (ref 11–51)

## 2024-10-29 MED ORDER — CEPHALEXIN 500 MG PO CAPS
500.0000 mg | ORAL_CAPSULE | Freq: Once | ORAL | Status: AC
  Administered 2024-10-29: 500 mg via ORAL
  Filled 2024-10-29: qty 1

## 2024-10-29 MED ORDER — CEPHALEXIN 500 MG PO CAPS
500.0000 mg | ORAL_CAPSULE | Freq: Four times a day (QID) | ORAL | 0 refills | Status: AC
  Filled 2024-10-29: qty 20, 5d supply, fill #0

## 2024-10-29 MED ORDER — PHENAZOPYRIDINE HCL 200 MG PO TABS
200.0000 mg | ORAL_TABLET | Freq: Three times a day (TID) | ORAL | 0 refills | Status: AC | PRN
  Filled 2024-10-29: qty 6, 2d supply, fill #0

## 2024-10-29 NOTE — ED Provider Notes (Signed)
 " [facility] AT [facility] Provider Note   CSN: [id number] Arrival date & time: 10/29/24  1333     Patient presents with: Dysuria   Crystal [name] is a 64 y.o. female.  {Add pertinent medical, surgical, social history, OB history to YEP:[id number]}  Dysuria    Patient has history of hypertension diabetes acid reflux hyperlipidemia.  Patient presents ED with complaints of dysuria.  Patient states for the last 3 days she has had significant burning with urination.  She is not having abdominal pain.  No fevers.  She is not have any back pain.  No vomiting or diarrhea  Prior to Admission medications  Medication Sig Start Date End Date Taking? Authorizing Provider  acetaminophen  (TYLENOL ) 500 MG tablet Take 1,000 mg by mouth daily as needed for moderate pain.     [provider]  amLODipine  (NORVASC ) 10 MG tablet Take 1 tablet (10 mg total) by mouth daily. 02/12/24   Oley Bascom RAMAN, NP  blood glucose meter kit and supplies One Touch Verio. Use up to four times daily as directed. (FOR ICD-10 E10.9, E11.9). 02/11/20   Myrna Camelia HERO, NP  Blood Pressure Monitor DEVI Use as directed to monitor blood pressure once daily. Keep a log of your blood pressure readings. 03/24/24   Oley Bascom RAMAN, NP  cetirizine  (ZYRTEC  ALLERGY) 10 MG tablet Take 1 tablet (10 mg total) by mouth at bedtime. 10/14/23   Oley Bascom RAMAN, NP  Continuous Glucose Sensor (DEXCOM G7 15 DAY SENSOR) MISC Apply 1 unit every 15 days as directed 10/25/24   Motwani, Komal, MD  Continuous Glucose Sensor (FREESTYLE LIBRE 3 PLUS SENSOR) MISC Change sensor every 15 days. 10/25/24   Motwani, Komal, MD  diclofenac  Sodium (VOLTAREN  ARTHRITIS PAIN) 1 % GEL Apply 2 grams topically 4 (four) times daily. 06/23/24   Raspet, Erin K, PA-C  empagliflozin  (JARDIANCE ) 25 MG TABS tablet Take 1 tablet (25 mg total) by mouth daily. 09/08/24   Motwani, Komal, MD  fluocinonide  ointment (LIDEX ) 0.05 % Apply daily to  scalp once daily for 2 weeks. Then for maintenance, use 2-3 times weekly. 11/06/23     fluticasone  (FLONASE ) 50 MCG/ACT nasal spray Place 1 spray into both nostrils 2 (two) times daily as needed (nasal congestion). 10/14/23   Oley Bascom RAMAN, NP  hydroquinone  4 % cream Apply a thin layer to dark circles around eyes nightly for 2-3 months. Then stop for 1-2 months. If discoloration recurs may restart cycle. 11/06/23     Lancets (ONETOUCH ULTRASOFT) lancets Use as instructed 02/11/20   Myrna Camelia HERO, NP  levothyroxine  (SYNTHROID ) 100 MCG tablet Take 1 tablet (100 mcg total) by mouth daily. 10/11/24   Motwani, Komal, MD  lidocaine  (LIDODERM ) 5 % Place 1 patch onto the skin daily. Remove & Discard patch within 12 hours or as directed by MD Patient taking differently: Place 1 patch onto the skin daily as needed. Remove & Discard patch within 12 hours or as directed by MD 02/24/23   Georgina Ozell LABOR, MD  metFORMIN  (GLUCOPHAGE ) 500 MG tablet Take 2 tablets (1,000 mg total) by mouth 2 (two) times daily with a meal. 08/11/24 08/11/25  Motwani, Obadiah, MD  oxyCODONE  (OXY IR/ROXICODONE ) 5 MG immediate release tablet Take 1-2 tablets (5-10 mg total) by mouth every 6 (six) hours as needed for moderate pain (pain score 4-6). 10/08/24   Eletha Boas, MD  pantoprazole  (PROTONIX ) 40 MG tablet Take 1 tablet (40 mg total) by mouth  daily. Patient taking differently: Take 40 mg by mouth daily as needed. 10/14/23   Oley Bascom RAMAN, NP  Salicylic Acid  6 % SHAM Apply 1 Application topically daily as needed. 12/17/23   Reddick, Johnathan B, NP    Allergies: Aspirin, Porcine (pork) protein-containing drug products, and Tramadol     Review of Systems  Genitourinary:  Positive for dysuria.    Updated Vital Signs BP (!) 162/78 (BP Location: Right Arm)   Pulse 87   Temp 98.1 F (36.7 C) (Oral)   Resp 16   LMP 06/02/2013   SpO2 99%   Physical Exam Vitals and nursing note reviewed.  Constitutional:      General: She is not in  acute distress.    Appearance: She is well-developed.  HENT:     Head: Normocephalic and atraumatic.     Right Ear: External ear normal.     Left Ear: External ear normal.  Eyes:     General: No scleral icterus.       Right eye: No discharge.        Left eye: No discharge.     Conjunctiva/sclera: Conjunctivae normal.  Neck:     Trachea: No tracheal deviation.  Cardiovascular:     Rate and Rhythm: Normal rate and regular rhythm.  Pulmonary:     Effort: Pulmonary effort is normal. No respiratory distress.     Breath sounds: Normal breath sounds. No stridor. No wheezing or rales.  Abdominal:     General: Bowel sounds are normal. There is no distension.     Palpations: Abdomen is soft.     Tenderness: There is no abdominal tenderness. There is no guarding or rebound.  Musculoskeletal:        General: No tenderness or deformity.     Cervical back: Neck supple.  Skin:    General: Skin is warm and dry.     Findings: No rash.  Neurological:     General: No focal deficit present.     Mental Status: She is alert.     Cranial Nerves: No cranial nerve deficit, dysarthria or facial asymmetry.     Sensory: No sensory deficit.     Motor: No abnormal muscle tone or seizure activity.     Coordination: Coordination normal.  Psychiatric:        Mood and Affect: Mood normal.     (all labs ordered are listed, but only abnormal results are displayed) Labs Reviewed  COMPREHENSIVE METABOLIC PANEL WITH GFR  LIPASE, BLOOD  CBC WITH DIFFERENTIAL/PLATELET  URINALYSIS, W/ REFLEX TO CULTURE (INFECTION SUSPECTED)    EKG: None  Radiology: No results found.  {Document cardiac monitor, telemetry assessment procedure when appropriate:32947} Procedures   Medications Ordered in the ED - No data to display    {Click here for ABCD2, HEART and other calculators REFRESH Note before signing:1}                              Medical Decision Making Amount and/or Complexity of Data Reviewed Labs:  ordered.   ***  {Document critical care time when appropriate  Document review of labs and clinical decision tools ie CHADS2VASC2, etc  Document your independent review of radiology images and any outside records  Document your discussion with family members, caretakers and with consultants  Document social determinants of health affecting pt's care  Document your decision making why or why not admission, treatments were needed:32947:::1}   Final  diagnoses:  None    ED Discharge Orders     None        "

## 2024-10-29 NOTE — Discharge Instructions (Signed)
 Take the antibiotics as prescribed.  Follow up with your doctor to be rechecked if the symptoms do not resolve after finishing the antibiotics.

## 2024-10-29 NOTE — ED Triage Notes (Signed)
 Patient reports dysuria for 3 days, denies fevers/abdominal pain/hematuria. Patient is alert and oriented x 4. Airway patent, respirations even and unlabored. Skin normal, warm and dry.

## 2024-10-29 NOTE — ED Provider Triage Note (Signed)
 Emergency Medicine Provider Triage Evaluation Note  Crystal [name] , a 64 y.o. female  was evaluated in triage.  Pt complains of dysuria.  She has had dysuria for about 3 days now.  She has not taken anything to help with.  She denies any abdominal pain or flank pain.  Denies any nausea or vomiting.  She reports that the pain is only whenever she urinates.  Review of Systems  Positive: Dysuria Negative:   Physical Exam  BP (!) 162/78 (BP Location: Right Arm)   Pulse 87   Temp 98.1 F (36.7 C) (Oral)   Resp 16   LMP 06/02/2013   SpO2 99%  Gen:   Awake, no distress   Resp:  Normal effort  MSK:   Moves extremities without difficulty  Other:  No abdominal tenderness or flank pain to palpation  Medical Decision Making  Medically screening exam initiated at 1:54 PM.  Appropriate orders placed.  Georgann Bramble Benison was informed that the remainder of the evaluation will be completed by another provider, this initial triage assessment does not replace that evaluation, and the importance of remaining in the ED until their evaluation is complete.     Rosaline Almarie MATSU, PA-C 10/29/24 1355

## 2024-11-02 ENCOUNTER — Telehealth: Payer: Self-pay

## 2024-11-02 NOTE — Transitions of Care (Post Inpatient/ED Visit) (Cosign Needed)
 "  11/02/2024  Name: Crystal [name] Dpiiph MRN: [id number] DOB: [birth year]  Today's TOC FU Call Status: Today's TOC FU Call Status:: Successful TOC FU Call Completed TOC FU Call Complete Date: 11/02/24  Patient's Name and Date of Birth confirmed. Name, DOB  Transition Care Management Follow-up Telephone Call Date of Discharge: 10/29/24 Discharge Facility: [facility]) Type of Discharge: Emergency Department Reason for ED Visit: Other: How have you been since you were released from the hospital?: Same Any questions or concerns?: No  Items Reviewed: Did you receive and understand the discharge instructions provided?: Yes Medications obtained,verified, and reconciled?: Yes (Medications Reviewed) Any new allergies since your discharge?: No Dietary orders reviewed?: No Do you have support at home?: No  Medications Reviewed Today: Medications Reviewed Today     Reviewed by Cole Geroge SQUIBB, CMA (Certified Medical Assistant) on 11/02/24 at 1115  Med List Status: <None>   Medication Order Taking? Sig Documenting Provider Last Dose Status Informant  acetaminophen  (TYLENOL ) 500 MG tablet [id number] Yes Take 1,000 mg by mouth daily as needed for moderate pain.  [provider]  Active Self  amLODipine  (NORVASC ) 10 MG tablet [id number] Yes Take 1 tablet (10 mg total) by mouth daily. Oley Bascom RAMAN, NP  Active Self  blood glucose meter kit and supplies [id number] Yes One Touch Verio. Use up to four times daily as directed. (FOR ICD-10 E10.9, E11.9). Myrna Camelia HERO, NP  Active Self  Blood Pressure Monitor DEVI [id number]  Use as directed to monitor blood pressure once daily. Keep a log of your blood pressure readings.  Patient not taking: Reported on 11/02/2024   Oley Bascom RAMAN, NP  Active Self  cephALEXin  (KEFLEX ) 500 MG capsule [id number]  Take 1 capsule (500 mg total) by mouth 4 (four) times daily for 5 days.  Patient not taking: Reported on 11/02/2024   Randol Simmonds, MD  Active    cetirizine  (ZYRTEC  ALLERGY) 10 MG tablet [id number] Yes Take 1 tablet (10 mg total) by mouth at bedtime. Oley Bascom RAMAN, NP  Active Self  Continuous Glucose Sensor (DEXCOM G7 15 DAY SENSOR) [place] [id number]  Apply 1 unit every 15 days as directed  Patient not taking: Reported on 11/02/2024   Motwani, Komal, MD  Active   Continuous Glucose Sensor (FREESTYLE LIBRE 3 PLUS SENSOR) MISC [id number]  Change sensor every 15 days.  Patient not taking: Reported on 11/02/2024   Motwani, Komal, MD  Active   diclofenac  Sodium (VOLTAREN  ARTHRITIS PAIN) 1 % GEL [id number]  Apply 2 grams topically 4 (four) times daily.  Patient not taking: Reported on 11/02/2024   Raspet, Erin K, PA-C  Active Self  empagliflozin  (JARDIANCE ) 25 MG TABS tablet [id number]  Take 1 tablet (25 mg total) by mouth daily.  Patient not taking: Reported on 11/02/2024   Motwani, Komal, MD  Active Self  fluocinonide  ointment (LIDEX ) 0.05 % [id number]  Apply daily to scalp once daily for 2 weeks. Then for maintenance, use 2-3 times weekly.   Active Self  fluticasone  (FLONASE ) 50 MCG/ACT nasal spray [id number] Yes Place 1 spray into both nostrils 2 (two) times daily as needed (nasal congestion). Oley Bascom RAMAN, NP  Active Self           Med Note ARNELL, CHRISTINA L   Wed Jun 23, 2024  4:40 PM) prn  hydroquinone  4 % cream [id number]  Apply a thin layer to dark circles around eyes nightly for 2-3 months. Then stop for 1-2 months. If discoloration recurs may restart  cycle.  Patient not taking: Reported on 11/02/2024     Active Self  Lancets [facility] ULTRASOFT) lancets [id number] Yes Use as instructed Myrna Camelia HERO, NP  Active Self  levothyroxine  (SYNTHROID ) 100 MCG tablet [id number] Yes Take 1 tablet (100 mcg total) by mouth daily. Dartha Ernst, MD  Active   lidocaine  (LIDODERM ) 5 % [id number] Yes Place 1 patch onto the skin daily. Remove & Discard patch within 12 hours or as directed by MD Georgina Ozell LABOR, MD  Active Self           Med Note  ALLEGRA, [facility] I   Sat Oct 09, 2024 12:59 AM)    MEDERMA gel [id number]   Nitka, James E, MD  Active   metFORMIN  (GLUCOPHAGE ) 500 MG tablet [id number] Yes Take 2 tablets (1,000 mg total) by mouth 2 (two) times daily with a meal. Dartha Ernst, MD  Active Self  oxyCODONE  (OXY IR/ROXICODONE ) 5 MG immediate release tablet [id number] Yes Take 1-2 tablets (5-10 mg total) by mouth every 6 (six) hours as needed for moderate pain (pain score 4-6). Eletha Boas, MD  Active   pantoprazole  (PROTONIX ) 40 MG tablet [id number] Yes Take 1 tablet (40 mg total) by mouth daily. Oley Bascom RAMAN, NP  Active Self           Med Note ARNELL, CHRISTINA L   Wed Jun 23, 2024  4:40 PM) prn  phenazopyridine  (PYRIDIUM ) 200 MG tablet [id number] Yes Take 1 tablet (200 mg total) by mouth 3 (three) times daily as needed (burning with urination). Randol Simmonds, MD  Active   Salicylic Acid  6 % SHAM [id number] Yes Apply 1 Application topically daily as needed. Aurea Ethel NOVAK, NP  Active Self            Home Care and Equipment/Supplies: Were Home Health Services Ordered?: No Any new equipment or medical supplies ordered?: No  Functional Questionnaire: Do you need assistance with bathing/showering or dressing?: No Do you need assistance with meal preparation?: No Do you need assistance with eating?: No Do you have difficulty maintaining continence: No Do you need assistance with getting out of bed/getting out of a chair/moving?: No Do you have difficulty managing or taking your medications?: No  Follow up appointments reviewed: PCP Follow-up appointment confirmed?: Yes Date of PCP follow-up appointment?: 11/15/24 Specialist Hospital Follow-up appointment confirmed?: No Reason Specialist Follow-Up Not Confirmed: Appointment Sceduled by [facility] Calling Clinician Do you need transportation to your follow-up appointment?: Yes Transportation Need Intervention Addressed By:: AMB Referral For SDOH Needs Do you understand care  options if your condition(s) worsen?: Yes-patient verbalized understanding  SDOH Interventions Today    Flowsheet Row Most Recent Value  SDOH Interventions   Food Insecurity Interventions Intervention Not Indicated  Housing Interventions Intervention Not Indicated  Transportation Interventions Intervention Not Indicated  Utilities Interventions Intervention Not Indicated    SIGNATURE Geroge Hahn, CMA   "

## 2024-11-03 ENCOUNTER — Ambulatory Visit

## 2024-11-05 ENCOUNTER — Other Ambulatory Visit

## 2024-11-10 ENCOUNTER — Ambulatory Visit

## 2024-11-11 ENCOUNTER — Other Ambulatory Visit (HOSPITAL_COMMUNITY): Payer: Self-pay

## 2024-11-15 ENCOUNTER — Inpatient Hospital Stay: Payer: Self-pay | Admitting: Nurse Practitioner

## 2024-11-16 ENCOUNTER — Other Ambulatory Visit

## 2024-11-22 ENCOUNTER — Ambulatory Visit: Admitting: "Endocrinology
